# Patient Record
Sex: Male | Born: 1950
Health system: Southern US, Community
[De-identification: ages and names within clinical notes are randomized; demographics above are authoritative.]

## PROBLEM LIST (undated history)

## (undated) DIAGNOSIS — E785 Hyperlipidemia, unspecified: Secondary | ICD-10-CM

## (undated) DIAGNOSIS — I10 Essential (primary) hypertension: Secondary | ICD-10-CM

## (undated) DIAGNOSIS — C61 Malignant neoplasm of prostate: Secondary | ICD-10-CM

## (undated) DIAGNOSIS — E119 Type 2 diabetes mellitus without complications: Secondary | ICD-10-CM

## (undated) HISTORY — PX: HEMORRHOID SURGERY: SHX153

## (undated) HISTORY — PX: COLONOSCOPY: SHX174

---

## 2003-02-06 ENCOUNTER — Ambulatory Visit (HOSPITAL_COMMUNITY): Admission: RE | Admit: 2003-02-06 | Discharge: 2003-02-06 | Payer: Self-pay | Admitting: Gastroenterology

## 2011-09-15 ENCOUNTER — Other Ambulatory Visit (HOSPITAL_COMMUNITY): Payer: Self-pay | Admitting: Urology

## 2011-09-15 DIAGNOSIS — C61 Malignant neoplasm of prostate: Secondary | ICD-10-CM

## 2011-09-25 ENCOUNTER — Encounter (HOSPITAL_COMMUNITY)
Admission: RE | Admit: 2011-09-25 | Discharge: 2011-09-25 | Disposition: A | Discharge status: Home / Self Care | Payer: BC Managed Care – PPO | Source: Ambulatory Visit | Attending: Urology | Admitting: Urology

## 2011-09-25 ENCOUNTER — Ambulatory Visit (HOSPITAL_COMMUNITY): Payer: Self-pay

## 2011-09-25 DIAGNOSIS — C61 Malignant neoplasm of prostate: Secondary | ICD-10-CM | POA: Insufficient documentation

## 2011-09-25 MED ORDER — TECHNETIUM TC 99M MEDRONATE IV KIT
26.0000 | PACK | Freq: Once | INTRAVENOUS | Status: AC | PRN
  Administered 2011-09-25: 26 via INTRAVENOUS

## 2011-11-02 ENCOUNTER — Other Ambulatory Visit (HOSPITAL_COMMUNITY): Payer: Self-pay | Admitting: Urology

## 2011-11-02 DIAGNOSIS — C61 Malignant neoplasm of prostate: Secondary | ICD-10-CM

## 2011-11-14 ENCOUNTER — Encounter (HOSPITAL_COMMUNITY): Payer: Self-pay | Admitting: Pharmacy Technician

## 2011-11-14 ENCOUNTER — Other Ambulatory Visit: Payer: Self-pay | Admitting: Radiology

## 2011-11-16 ENCOUNTER — Encounter (HOSPITAL_COMMUNITY): Payer: Self-pay

## 2011-11-16 ENCOUNTER — Ambulatory Visit (HOSPITAL_COMMUNITY)
Admission: RE | Admit: 2011-11-16 | Discharge: 2011-11-16 | Disposition: A | Discharge status: Home / Self Care | Payer: BC Managed Care – PPO | Source: Ambulatory Visit | Attending: Urology | Admitting: Urology

## 2011-11-16 DIAGNOSIS — E785 Hyperlipidemia, unspecified: Secondary | ICD-10-CM | POA: Insufficient documentation

## 2011-11-16 DIAGNOSIS — F17200 Nicotine dependence, unspecified, uncomplicated: Secondary | ICD-10-CM | POA: Insufficient documentation

## 2011-11-16 DIAGNOSIS — E119 Type 2 diabetes mellitus without complications: Secondary | ICD-10-CM | POA: Insufficient documentation

## 2011-11-16 DIAGNOSIS — I10 Essential (primary) hypertension: Secondary | ICD-10-CM | POA: Insufficient documentation

## 2011-11-16 DIAGNOSIS — R599 Enlarged lymph nodes, unspecified: Secondary | ICD-10-CM | POA: Insufficient documentation

## 2011-11-16 DIAGNOSIS — C61 Malignant neoplasm of prostate: Secondary | ICD-10-CM | POA: Insufficient documentation

## 2011-11-16 HISTORY — DX: Hyperlipidemia, unspecified: E78.5

## 2011-11-16 HISTORY — DX: Malignant neoplasm of prostate: C61

## 2011-11-16 HISTORY — DX: Essential (primary) hypertension: I10

## 2011-11-16 HISTORY — DX: Type 2 diabetes mellitus without complications: E11.9

## 2011-11-16 LAB — CBC
HCT: 42.3 % (ref 39.0–52.0)
Hemoglobin: 14.5 g/dL (ref 13.0–17.0)
MCH: 32.6 pg (ref 26.0–34.0)
MCHC: 34.3 g/dL (ref 30.0–36.0)
MCV: 95.1 fL (ref 78.0–100.0)
Platelets: 185 10*3/uL (ref 150–400)
RBC: 4.45 MIL/uL (ref 4.22–5.81)
RDW: 14.7 % (ref 11.5–15.5)
WBC: 9.8 10*3/uL (ref 4.0–10.5)

## 2011-11-16 LAB — GLUCOSE, CAPILLARY
Glucose-Capillary: 114 mg/dL — ABNORMAL HIGH (ref 70–99)
Glucose-Capillary: 133 mg/dL — ABNORMAL HIGH (ref 70–99)

## 2011-11-16 LAB — APTT: aPTT: 35 seconds (ref 24–37)

## 2011-11-16 LAB — PROTIME-INR
INR: 0.95 (ref 0.00–1.49)
Prothrombin Time: 12.6 seconds (ref 11.6–15.2)

## 2011-11-16 MED ORDER — MIDAZOLAM HCL 2 MG/2ML IJ SOLN
INTRAMUSCULAR | Status: AC
  Filled 2011-11-16: qty 6

## 2011-11-16 MED ORDER — FENTANYL CITRATE 0.05 MG/ML IJ SOLN
INTRAMUSCULAR | Status: DC | PRN
  Administered 2011-11-16 (×2): 50 ug via INTRAVENOUS

## 2011-11-16 MED ORDER — FENTANYL CITRATE 0.05 MG/ML IJ SOLN
INTRAMUSCULAR | Status: AC
  Filled 2011-11-16: qty 4

## 2011-11-16 MED ORDER — MIDAZOLAM HCL 2 MG/2ML IJ SOLN
INTRAMUSCULAR | Status: DC | PRN
  Administered 2011-11-16: 1 mg via INTRAVENOUS
  Administered 2011-11-16: 2 mg via INTRAVENOUS

## 2011-11-16 MED ORDER — SODIUM CHLORIDE 0.9 % IV SOLN: Freq: Once | INTRAVENOUS | Status: DC

## 2011-11-16 NOTE — H&P (Signed)
Kyle George is an 61 y.o. male.   Chief Complaint: new dx prostate ca Left iliac lymphadenopathy Scheduled for bx per Dr Brunilda Payor HPI: DM; HTN; HLD  Past Medical History  Diagnosis Date  . Diabetes mellitus without complication   . Cancer of prostate   . Hypertension   . Hyperlipidemia     Past Surgical History  Procedure Date  . Hemorrhoid surgery     History reviewed. No pertinent family history. Social History:  reports that he has been smoking.  He does not have any smokeless tobacco history on file. His alcohol and drug histories not on file.  Allergies: No Known Allergies   (Not in a hospital admission)  Results for orders placed during the hospital encounter of 11/16/11 (from the past 48 hour(s))  APTT     Status: Normal   Collection Time   11/16/11 10:11 AM      Component Value Range Comment   aPTT 35  24 - 37 seconds   CBC     Status: Normal   Collection Time   11/16/11 10:11 AM      Component Value Range Comment   WBC 9.8  4.0 - 10.5 K/uL    RBC 4.45  4.22 - 5.81 MIL/uL    Hemoglobin 14.5  13.0 - 17.0 g/dL    HCT 96.0  45.4 - 09.8 %    MCV 95.1  78.0 - 100.0 fL    MCH 32.6  26.0 - 34.0 pg    MCHC 34.3  30.0 - 36.0 g/dL    RDW 11.9  14.7 - 82.9 %    Platelets 185  150 - 400 K/uL   PROTIME-INR     Status: Normal   Collection Time   11/16/11 10:11 AM      Component Value Range Comment   Prothrombin Time 12.6  11.6 - 15.2 seconds    INR 0.95  0.00 - 1.49   GLUCOSE, CAPILLARY     Status: Abnormal   Collection Time   11/16/11 10:15 AM      Component Value Range Comment   Glucose-Capillary 114 (*) 70 - 99 mg/dL    Comment 1 Notify RN      Comment 2 Documented in Chart      No results found.  Review of Systems  Constitutional: Negative for fever and chills.  Respiratory: Negative for cough.   Cardiovascular: Negative for chest pain.  Gastrointestinal: Negative for nausea and vomiting.  Neurological: Negative for weakness and headaches.    Blood  pressure 155/61, pulse 59, temperature 97.7 F (36.5 C), temperature source Oral, resp. rate 18, height 5\' 9"  (1.753 m), weight 202 lb (91.627 kg), SpO2 99.00%. Physical Exam  Constitutional: He is oriented to person, place, and time.  Cardiovascular: Normal rate, regular rhythm and normal heart sounds.   No murmur heard. Respiratory: Effort normal and breath sounds normal. He has no wheezes.  GI: Soft. Bowel sounds are normal. There is no tenderness.  Musculoskeletal: Normal range of motion.  Neurological: He is alert and oriented to person, place, and time.  Psychiatric: He has a normal mood and affect. His behavior is normal. Judgment and thought content normal.     Assessment/Plan New Dx Prostate Ca Iliac LAN Scheduled for Left iliac LN bx today Pt aware of procedure benefits and risks and agreeable to proceed Consent signed and in chart  [place]leg[place] Dominique A 11/16/2011, 11:06 AM

## 2011-11-16 NOTE — Procedures (Signed)
Successful RT ILIAC ADENOPATHY 18G CORE BXS NO COMP STABLE FULL REPORT IN PACS PATH PENDING

## 2013-10-30 ENCOUNTER — Other Ambulatory Visit (HOSPITAL_COMMUNITY): Payer: Self-pay | Admitting: Urology

## 2013-10-30 DIAGNOSIS — C61 Malignant neoplasm of prostate: Secondary | ICD-10-CM

## 2013-11-07 ENCOUNTER — Ambulatory Visit (HOSPITAL_COMMUNITY)
Admission: RE | Admit: 2013-11-07 | Discharge: 2013-11-07 | Disposition: A | Discharge status: Home / Self Care | Payer: BC Managed Care – PPO | Source: Ambulatory Visit | Attending: Diagnostic Radiology | Admitting: Diagnostic Radiology

## 2013-11-07 ENCOUNTER — Encounter (HOSPITAL_COMMUNITY): Payer: BC Managed Care – PPO

## 2013-11-07 ENCOUNTER — Ambulatory Visit (HOSPITAL_COMMUNITY)
Admission: RE | Admit: 2013-11-07 | Discharge: 2013-11-07 | Disposition: A | Discharge status: Home / Self Care | Payer: BC Managed Care – PPO | Source: Ambulatory Visit | Attending: Urology | Admitting: Urology

## 2013-11-07 ENCOUNTER — Ambulatory Visit (HOSPITAL_COMMUNITY): Admission: RE | Admit: 2013-11-07 | Payer: BC Managed Care – PPO | Source: Ambulatory Visit

## 2013-11-07 DIAGNOSIS — C61 Malignant neoplasm of prostate: Secondary | ICD-10-CM | POA: Diagnosis not present

## 2013-11-07 MED ORDER — TECHNETIUM TC 99M MEDRONATE IV KIT
24.0000 | PACK | Freq: Once | INTRAVENOUS | Status: AC | PRN
  Administered 2013-11-07: 24 via INTRAVENOUS

## 2013-12-10 ENCOUNTER — Telehealth: Payer: Self-pay | Admitting: Oncology

## 2013-12-10 NOTE — Telephone Encounter (Signed)
S/W PATIENT AND GAVE NP APPT FOR 12/04 @ 1:30 W/DR. SHADAD.  REFERRING DR. MARC NESI DX- PROSTATE CA

## 2013-12-12 ENCOUNTER — Ambulatory Visit (HOSPITAL_BASED_OUTPATIENT_CLINIC_OR_DEPARTMENT_OTHER): Payer: BC Managed Care – PPO | Admitting: Oncology

## 2013-12-12 ENCOUNTER — Telehealth: Payer: Self-pay | Admitting: Oncology

## 2013-12-12 ENCOUNTER — Other Ambulatory Visit: Payer: BC Managed Care – PPO

## 2013-12-12 ENCOUNTER — Ambulatory Visit: Payer: BC Managed Care – PPO

## 2013-12-12 VITALS — BP 147/95 | HR 77 | Temp 98.1°F | Resp 18 | Ht 69.0 in | Wt 210.0 lb

## 2013-12-12 DIAGNOSIS — C61 Malignant neoplasm of prostate: Secondary | ICD-10-CM

## 2013-12-12 DIAGNOSIS — C7951 Secondary malignant neoplasm of bone: Secondary | ICD-10-CM

## 2013-12-12 DIAGNOSIS — R609 Edema, unspecified: Secondary | ICD-10-CM

## 2013-12-12 NOTE — Progress Notes (Signed)
Please see consult note.  

## 2013-12-12 NOTE — Telephone Encounter (Signed)
Pt confirmed labs/ov per 12/04 POF, gave pt AVS..... KJ

## 2013-12-12 NOTE — Consult Note (Signed)
Reason for Referral: Prostate cancer.  HPI: 63 year old gentleman currently of [place]. He is a rather healthy gentleman with history of hypertension and diabetes but relatively controlled. He was in his usual state of health until about 2013 to he presented with an elevated PSA of 397. He was referred to Dr. Janice Norrie and on digital rectal examination he was found to have an indurated left lobe. He underwent a prostate biopsy on 09/08/2011 and the pathology revealed a Gleason score of 4+5 = 9 as well as a 4+4 = 8 and the majority of the cores. Patient failed to follow-up subsequently and apparently developed lower extremity edema presumably lymphadenopathy. He reestablish care in October 2015 and a bone scan showed widespread bony metastatic disease. He was started on Firmagon and at that time his PSA was 3779. His PSA a month later dropped down to 500. He tolerated androgen deprivation well and was referred to me for consideration of systemic chemotherapy.  Clinically, he is asymptomatic at this time. He does not report any headaches or blurry vision or syncope. He does not report any fevers, chills, sweats, weight loss or appetite changes. Does not affect care of gained weight since androgen deprivation started. He did not report any hot flashes or any constitutional symptoms. He did not report any chest pain, palpitation, orthopnea or PND. He still has chronic left lower extremity edema. He does not report any cough, shortness of breath or hemoptysis. He does not report any nausea, vomiting, constipation, diarrhea or any changes in his bowel habits. He does not report any frequency, urgency hematuria or dysuria. He does not report any skeletal complaints his back pain and hip pain or shoulder pain. He does not report any lymphadenopathy or petechiae. He continues to work full time without any hindrance or decline. Rest of his review of systems unremarkable.   Past Medical History  Diagnosis Date   . Diabetes mellitus without complication   . Cancer of prostate   . Hypertension   . Hyperlipidemia   :  Past Surgical History  Procedure Laterality Date  . Hemorrhoid surgery    :   Current Outpatient Prescriptions  Medication Sig Dispense Refill  . Multiple Vitamin (MULTIVITAMIN WITH MINERALS) TABS Take 1 tablet by mouth daily.    . pioglitazone-metformin (ACTOPLUS MET) 15-850 MG per tablet Take 1 tablet by mouth Twice daily.    . rosuvastatin (CRESTOR) 10 MG tablet Take 10 mg by mouth daily.    . sitaGLIPtin (JANUVIA) 100 MG tablet Take 100 mg by mouth daily.    Jabier Gauss 40-10-25 MG TABS Take 1 tablet by mouth Daily.     No current facility-administered medications for this visit.      No Known Allergies:   History   Social History  . Marital Status: Married    Spouse Name: N/A    Number of Children: N/A  . Years of Education: N/A   Occupational History  . Not on file.   Social History Main Topics  . Smoking status: Current Every Day Smoker  . Smokeless tobacco: Not on file  . Alcohol Use: Not on file  . Drug Use: Not on file  . Sexual Activity: Not on file   Other Topics Concern  . Not on file   Social History Narrative  . No narrative on file  :  Pertinent items are noted in HPI.  Exam: ECOG 0 There were no vitals taken for this visit. General appearance: alert and cooperative Head: Normocephalic,  without obvious abnormality Throat: lips, mucosa, and tongue normal; teeth and gums normal Neck: no adenopathy Back: negative Resp: clear to auscultation bilaterally Chest wall: no tenderness GI: soft, non-tender; bowel sounds normal; no masses,  no organomegaly Male genitalia: normal Extremities: Edema noted left more than the right at 1+. Pulses: 2+ and symmetric Skin: Skin color, texture, turgor normal. No rashes or lesions Lymph nodes: Cervical, supraclavicular, and axillary nodes normal.    Assessment and Plan:    63 year old  gentleman with the following issues:  1. Prostate cancer diagnosed in August 2013 after presenting with a PSA of 397. His initial biopsy showed a Gleason score of 4+5 = 9 with a predominant pattern 4+4 = 8 and 11 out of 12 cores. He was lost to follow-up and initiated therapy in October 2015 after his PSA was up to 3779. His bone scan showed metastatic bony disease and possibly he has pelvic adenopathy with chronic left lower extremity edema. His PSA dropped down to 500 after one month of androgen deprivation in the form of Firmagon.  The natural course of advanced prostate cancer was discussed with the patient extensively. He understand these treatment modalities or not curative but rather palliative at this time. At this time, he has excellent response to androgen deprivation but certainly has bulky disease and qualifies for systemic chemotherapy. The risks and benefits of adding Taxotere chemotherapy for a total of 6 cycles was discussed today. Complications include nausea, vomiting, myelosuppression, neutropenia, neutropenic sepsis, peripheral neuropathy as well as infusion related toxicities were discussed. The benefit would include improve cancer control and overall survival. That overall survival can exceed double digit months and certain patient populations.  The alternatives is to await castration resistant disease which given his advanced cancer this can happen rather quick.  After discussing the risks and benefits of all these approaches he would like to consider all his options at this time and to think about it. He prefers to continue working to his retirement presumably in the spring of 2016. May be after that he will consider adding chemotherapy. I will arrange for a quick follow-up in 3 months to check on his clinical status and certainly if he changes his mind we can proceed with systemic chemotherapy sooner.  2. Androgen depravation: I recommend continuing that for the time being and  likely indefinitely.  3. IV access: He will probably require a Port-A-Cath insertion upon deciding on chemotherapy. Complications include thrombosis, bleeding and infection.  4. Bone health: He would benefit from bone directed therapy such as Xgeva or Zometa after dental clearance. This will continue to be addressed in future visits.  All his questions were answered today to his satisfaction.

## 2014-03-13 ENCOUNTER — Ambulatory Visit (HOSPITAL_BASED_OUTPATIENT_CLINIC_OR_DEPARTMENT_OTHER): Payer: BC Managed Care – PPO | Admitting: Oncology

## 2014-03-13 ENCOUNTER — Telehealth: Payer: Self-pay | Admitting: Oncology

## 2014-03-13 ENCOUNTER — Other Ambulatory Visit (HOSPITAL_BASED_OUTPATIENT_CLINIC_OR_DEPARTMENT_OTHER): Payer: BC Managed Care – PPO

## 2014-03-13 VITALS — BP 165/88 | HR 88 | Temp 97.8°F | Resp 20 | Ht 69.0 in | Wt 212.3 lb

## 2014-03-13 DIAGNOSIS — R609 Edema, unspecified: Secondary | ICD-10-CM

## 2014-03-13 DIAGNOSIS — E291 Testicular hypofunction: Secondary | ICD-10-CM

## 2014-03-13 DIAGNOSIS — C61 Malignant neoplasm of prostate: Secondary | ICD-10-CM

## 2014-03-13 DIAGNOSIS — C7951 Secondary malignant neoplasm of bone: Secondary | ICD-10-CM

## 2014-03-13 LAB — COMPREHENSIVE METABOLIC PANEL (CC13)
ALT: 21 U/L (ref 0–55)
AST: 17 U/L (ref 5–34)
Albumin: 3.3 g/dL — ABNORMAL LOW (ref 3.5–5.0)
Alkaline Phosphatase: 99 U/L (ref 40–150)
Anion Gap: 14 mEq/L — ABNORMAL HIGH (ref 3–11)
BUN: 9.7 mg/dL (ref 7.0–26.0)
CO2: 21 mEq/L — ABNORMAL LOW (ref 22–29)
Calcium: 9.1 mg/dL (ref 8.4–10.4)
Chloride: 103 mEq/L (ref 98–109)
Creatinine: 0.8 mg/dL (ref 0.7–1.3)
EGFR: >90 mL/min/{1.73_m2} (ref >90)
Glucose: 263 mg/dl — ABNORMAL HIGH (ref 70–140)
Potassium: 3.5 mEq/L (ref 3.5–5.1)
Sodium: 138 mEq/L (ref 136–145)
Total Bilirubin: 0.24 mg/dL (ref 0.20–1.20)
Total Protein: 7.1 g/dL (ref 6.4–8.3)

## 2014-03-13 LAB — CBC WITH DIFFERENTIAL/PLATELET
BASO%: 0.4 % (ref 0.0–2.0)
Basophils Absolute: 0 10*3/uL (ref 0.0–0.1)
EOS%: 3.8 % (ref 0.0–7.0)
Eosinophils Absolute: 0.3 10*3/uL (ref 0.0–0.5)
HCT: 41.4 % (ref 38.4–49.9)
HGB: 14 g/dL (ref 13.0–17.1)
LYMPH%: 27.5 % (ref 14.0–49.0)
MCH: 31.9 pg (ref 27.2–33.4)
MCHC: 33.8 g/dL (ref 32.0–36.0)
MCV: 94.3 fL (ref 79.3–98.0)
MONO#: 0.5 10*3/uL (ref 0.1–0.9)
MONO%: 6.3 % (ref 0.0–14.0)
NEUT#: 4.6 10*3/uL (ref 1.5–6.5)
NEUT%: 62 % (ref 39.0–75.0)
Platelets: 183 10*3/uL (ref 140–400)
RBC: 4.39 10*6/uL (ref 4.20–5.82)
RDW: 13.8 % (ref 11.0–14.6)
WBC: 7.5 10*3/uL (ref 4.0–10.3)
lymph#: 2.1 10*3/uL (ref 0.9–3.3)

## 2014-03-13 NOTE — Telephone Encounter (Signed)
Gave avs & calendar for August °

## 2014-03-13 NOTE — Progress Notes (Signed)
Hematology and Oncology Follow Up Visit  Kyle George [id number] 02-Jul-1950 64 y.o. 03/13/2014 3:06 PM Maximino Greenland, MDSanders, Bailey Mech, MD   Principle Diagnosis: 64 year old gentleman diagnosed with prostate cancer on 09/08/2011. He had a Gleason score 4+5 = 9 and a PSA of 397. He subsequently developed widespread metastatic bony disease and lymphadenopathy in October 2015. His PSA at that time was 3779.   Prior Therapy: Status post androgen deprivation and initially with [place] subsequently with Lupron with a PSA dropping down to 500 and December 2015.  Current therapy: Androgen deprivation therapy. He is under consideration for systemic chemotherapy which she has declined in the past.  Interim History:  [name] presents today for a follow-up visit. Since the last visit, he continues to do very well. He continues to work full time without any decline in his energy her performance status. He does not report any skeletal complaints including back pain, shoulder pain or hip pain. He is not reporting any lower extremity edema. Does not report any constitutional symptoms or early satiety. He does not report any headaches or blurry vision or syncope. He does not report any fevers, chills, sweats, weight loss or appetite changes. Does not affect care of gained weight since androgen deprivation started. He did not report any hot flashes or any constitutional symptoms. He did not report any chest pain, palpitation, orthopnea or PND. He still has chronic left lower extremity edema. He does not report any cough, shortness of breath or hemoptysis. He does not report any nausea, vomiting, constipation, diarrhea or any changes in his bowel habits. He does not report any frequency, urgency hematuria or dysuria. The rest of his review of systems unremarkable.  Medications: I have reviewed the patient's current medications.  Current Outpatient Prescriptions  Medication Sig Dispense Refill  . aspirin 81  MG tablet Take 81 mg by mouth daily.    . Calcium Carbonate-Vitamin D (CALCIUM 600+D PO) Take 1 tablet by mouth daily.    . carvedilol (COREG) 6.25 MG tablet Take 6.25 mg by mouth 2 (two) times daily.  3  . cholecalciferol (VITAMIN D) 1000 UNITS tablet Take 5,000 Units by mouth daily.    . Insulin Detemir (LEVEMIR FLEXTOUCH [place]) Inject into the skin.    . Multiple Vitamin (MULTIVITAMIN WITH MINERALS) TABS Take 1 tablet by mouth daily.    . NON FORMULARY Take 1 tablet by mouth daily. Thyroid  action    . pioglitazone-metformin (ACTOPLUS MET) 15-850 MG per tablet Take 1 tablet by mouth Twice daily.    . rosuvastatin (CRESTOR) 10 MG tablet Take 10 mg by mouth daily.    Jabier Gauss 40-10-25 MG TABS Take 1 tablet by mouth Daily.     No current facility-administered medications for this visit.     Allergies: No Known Allergies  Past Medical History, Surgical history, Social history, and Family History were reviewed and updated.   Physical Exam: Blood pressure 165/88, pulse 88, temperature 97.8 F (36.6 C), temperature source Oral, resp. rate 20, height _0  (1.753 m), weight 212 lb 4.8 oz (96.299 kg), SpO2 98 %. ECOG: 0 General appearance: alert and cooperative Head: Normocephalic, without obvious abnormality Neck: no adenopathy Lymph nodes: Cervical, supraclavicular, and axillary nodes normal. Heart:regular rate and rhythm, S1, S2 normal, no murmur, click, rub or gallop Lung:chest clear, no wheezing, rales, normal symmetric air entry Abdomin: soft, non-tender, without masses or organomegaly EXT:no erythema, induration, or nodules   Lab Results: Lab Results  Component Value Date  WBC 7.5 03/13/2014   HGB 14.0 03/13/2014   HCT 41.4 03/13/2014   MCV 94.3 03/13/2014   PLT 183 03/13/2014            Impression and Plan:   64 year old gentleman with the following issues  1. Prostate cancer diagnosed in August 2013 after presenting with a PSA of 397. His initial biopsy showed  a Gleason score of 4+5 = 9 with a predominant pattern 4+4 = 8 and 11 out of 12 cores. He was lost to follow-up and initiated therapy in October 2015 after his PSA was up to 3779. His bone scan showed metastatic bony disease and possibly he has pelvic adenopathy with chronic left lower extremity edema. His PSA dropped down to 500 after one month of androgen deprivation in the form of Firmagon.  His PSA from today is currently pending.  He continues to refuse systemic chemotherapy for the time being and would like to defer that until the future. Elect to keep continue to keep working at this time. I explained to him that he could potentially can keep working while on chemotherapy but he prefers not to take this chance. I will evaluate him in 4-5 months and readdress this issue with him.   2. Androgen depravation: I recommend continuing that for the time being and likely indefinitely.  3. IV access: He will probably require a Port-A-Cath insertion upon deciding on chemotherapy. He continues to defer this at this time.  4. Bone health: He would benefit from bone directed therapy such as Xgeva or Zometa after dental clearance. This will continue to be addressed in future visits.  5. Follow-up: Will be in August 2016 sooner if there is any issues.  [facility], MD 3/4/20163:06 PM

## 2014-03-14 LAB — PSA: PSA: 78.69 ng/mL — ABNORMAL HIGH (ref <=4.00)

## 2014-08-14 ENCOUNTER — Ambulatory Visit: Payer: BC Managed Care – PPO | Admitting: Oncology

## 2014-08-14 ENCOUNTER — Other Ambulatory Visit: Payer: BC Managed Care – PPO

## 2016-05-02 ENCOUNTER — Other Ambulatory Visit: Payer: Self-pay | Admitting: Urology

## 2016-05-02 DIAGNOSIS — C61 Malignant neoplasm of prostate: Secondary | ICD-10-CM

## 2016-05-10 ENCOUNTER — Encounter (HOSPITAL_COMMUNITY)
Admission: RE | Admit: 2016-05-10 | Discharge: 2016-05-10 | Disposition: A | Discharge status: Home / Self Care | Payer: BC Managed Care – PPO | Source: Ambulatory Visit | Attending: Urology | Admitting: Urology

## 2016-05-10 DIAGNOSIS — C61 Malignant neoplasm of prostate: Secondary | ICD-10-CM | POA: Diagnosis not present

## 2016-05-10 MED ORDER — TECHNETIUM TC 99M MEDRONATE IV KIT
20.3000 | PACK | Freq: Once | INTRAVENOUS | Status: AC | PRN
  Administered 2016-05-10: 20.3 via INTRAVENOUS

## 2016-05-29 ENCOUNTER — Encounter: Payer: Self-pay | Admitting: *Deleted

## 2016-05-31 ENCOUNTER — Telehealth: Payer: Self-pay | Admitting: Oncology

## 2016-05-31 NOTE — Telephone Encounter (Signed)
Spoke with patient re f/u 5/24

## 2016-06-01 ENCOUNTER — Telehealth: Payer: Self-pay | Admitting: Oncology

## 2016-06-01 ENCOUNTER — Ambulatory Visit (HOSPITAL_BASED_OUTPATIENT_CLINIC_OR_DEPARTMENT_OTHER): Payer: BC Managed Care – PPO | Admitting: Oncology

## 2016-06-01 VITALS — BP 146/79 | HR 61 | Temp 98.6°F | Resp 17 | Ht 69.0 in | Wt 192.9 lb

## 2016-06-01 DIAGNOSIS — R609 Edema, unspecified: Secondary | ICD-10-CM

## 2016-06-01 DIAGNOSIS — E291 Testicular hypofunction: Secondary | ICD-10-CM

## 2016-06-01 DIAGNOSIS — M25551 Pain in right hip: Secondary | ICD-10-CM | POA: Diagnosis not present

## 2016-06-01 DIAGNOSIS — C7951 Secondary malignant neoplasm of bone: Secondary | ICD-10-CM

## 2016-06-01 DIAGNOSIS — C61 Malignant neoplasm of prostate: Secondary | ICD-10-CM | POA: Diagnosis not present

## 2016-06-01 NOTE — Progress Notes (Signed)
Hematology and Oncology Follow Up Visit  Kyle George [id number] [birth year] 66 y.o. 06/01/2016 4:20 PM Kyle George, Kyle George, Kyle Mech, MD   Principle Diagnosis: 66 year old gentleman diagnosed with prostate cancer on 09/08/2011. He had a Gleason score 4+5 = 9 and a PSA of 397. He subsequently developed widespread metastatic bony disease and lymphadenopathy in October 2015. His PSA at that time was 3779.   Prior Therapy: Status post androgen deprivation and initially with [place] subsequently with Lupron with a PSA dropping down to 500 and December 2015.  Current therapy: Under consideration to start androgen deprivation therapy.  Interim History:  Kyle George presents today for a follow-up visit. Since the last visit, he had been lost to follow-up and has not been receiving any anticancer treatment. He missed his injection appointments with Dr. Louis Meckel and recently was evaluated for his prostate cancer. His PSA in April 2018 was 1200 and he has been complaining of right-sided hip pain. Despite his pain he is able to ambulate without any major difficulties and continues to work full time. He lost about 10 pounds. He is still able to drive and attends to activities of daily living without any major decline in his quality of life.   He does not report any headaches or blurry vision or syncope. He does not report any fevers, chills, sweats, weight loss or appetite changes.  He did not report any chest pain, palpitation, orthopnea or PND. He still has chronic left lower extremity edema. He does not report any cough, shortness of breath or hemoptysis. He does not report any nausea, vomiting, constipation, diarrhea or any changes in his bowel habits. He does not report any frequency, urgency hematuria or dysuria. The rest of his review of systems unremarkable.  Medications: I have reviewed the patient's current medications.  Current Outpatient Prescriptions  Medication Sig Dispense Refill  .  aspirin 81 MG tablet Take 81 mg by mouth daily.    . Calcium Carbonate-Vitamin D (CALCIUM 600+D PO) Take 1 tablet by mouth daily.    . carvedilol (COREG) 6.25 MG tablet Take 6.25 mg by mouth 2 (two) times daily.  3  . cholecalciferol (VITAMIN D) 1000 UNITS tablet Take 5,000 Units by mouth daily.    . Insulin Detemir (LEVEMIR FLEXTOUCH [place]) Inject into the skin.    . Multiple Vitamin (MULTIVITAMIN WITH MINERALS) TABS Take 1 tablet by mouth daily.    . NON FORMULARY Take 1 tablet by mouth daily. Thyroid  action    . pioglitazone-metformin (ACTOPLUS MET) 15-850 MG per tablet Take 1 tablet by mouth Twice daily.    . rosuvastatin (CRESTOR) 10 MG tablet Take 10 mg by mouth daily.    Kyle George 40-10-25 MG TABS Take 1 tablet by mouth Daily.     No current facility-administered medications for this visit.      Allergies: No Known Allergies  Past Medical History, Surgical history, Social history, and Family History were reviewed and updated.   Physical Exam: Blood pressure (!) 146/79, pulse 61, temperature 98.6 F (37 C), temperature source Oral, resp. rate 17, height '5\' 9"'  (1.753 m), weight 192 lb 14.4 oz (87.5 kg), SpO2 98 %. ECOG: 0 General appearance: alert and cooperative appeared without distress. Head: Normocephalic, without obvious abnormality no oral ulcers or lesions. Neck: no adenopathy Lymph nodes: Cervical, supraclavicular, and axillary nodes normal. Heart:regular rate and rhythm, S1, S2 normal, no murmur, click, rub or gallop Lung:chest clear, no wheezing, rales, normal symmetric air entry Abdomin: soft, non-tender, without masses  or organomegaly no shifting dullness or ascites. EXT:no erythema, induration, or nodules   Lab Results: Lab Results  Component Value Date   WBC 7.5 03/13/2014   HGB 14.0 03/13/2014   HCT 41.4 03/13/2014   MCV 94.3 03/13/2014   PLT 183 03/13/2014           EXAM: NUCLEAR MEDICINE WHOLE BODY BONE SCAN  TECHNIQUE: Whole body anterior and  posterior images were obtained approximately 3 hours after intravenous injection of radiopharmaceutical.  RADIOPHARMACEUTICALS:  20.3 mCi Technetium-27mMDP IV  COMPARISON:  11/07/2013  FINDINGS: There is adequate uptake of radioactive tracer throughout the bony skeleton. Bilateral renal activity is noted. There again noted multiple areas of increased activity consistent with metastatic disease. These changes have progressed somewhat in the proximal left humerus, right scapula, L3 vertebra posteriorly as well as within the pelvic bones when compared with the prior exam. Some decreased activity is noted particularly in the ribcage.  IMPRESSION: Changes consistent with multifocal metastatic disease from the patient's known prostate carcinoma. Some areas have increased in activity of the ribcage predominately has decreased in activity from the prior study.  Impression and Plan:   66year old gentleman with the following issues  1. Prostate cancer diagnosed in August 2013 after presenting with a PSA of 397. His initial biopsy showed a Gleason score of 4+5 = 9 with a predominant pattern 4+4 = 8 and 11 out of 12 cores. He was lost to follow-up and initiated therapy in October 2015 after his PSA was up to 3779. His bone scan showed metastatic bony disease and possibly he has pelvic adenopathy with chronic left lower extremity edema. His PSA dropped down to 500 after one month of androgen deprivation in the form of Firmagon.  His PSA in March 2016 was 78.  He has not received any therapy since that time and he reestablish care in April 2018. His PSA was up to 1200 and bone scan on 05/10/2016 showed widespread metastatic disease. He also had a CT scan of the abdomen and pelvis which showed pelvic adenopathy.  The natural course of this disease was discussed today with the patient. It is unclear to me that he had developed castration resistant disease and likely he still has hormone  sensitive element of his disease. The first step to treating him we'll be restarting androgen deprivation therapy.  Given his bulky disease and high-risk features, he will benefit from additional therapy. Systemic chemotherapy will be his best option which will be considered after initiation of androgen deprivation. I discussed the risks and benefits associated with Taxotere chemotherapy. These complications include nausea, vomiting, myelosuppression among others.  For the time being he agreed to proceed with androgen deprivation and consider chemotherapy in the future. I will repeat his PSA in 6 weeks and we introduced the idea of starting chemotherapy at that time.  2. Androgen depravation: He'll receive a Lupron 30 mg every 4 months indefinitely.  3. IV access: He will probably require a Port-A-Cath insertion upon deciding on chemotherapy. He continues to defer this at this time.  4. Bone health: He would benefit from bone directed therapy such as Xgeva or Zometa after dental clearance. This will continue to be addressed in future visits.  5. Hip pain: Unclear for related to his cancer or arthritis. If his pain does not improve with hormone therapy, radiation therapy could be an option as well for palliative purposes.  6. Follow-up: Will be in one week to receive Lupron. I will recheck his  PSA in 6 weeks and consider starting chemotherapy at that time.  Zola Button, MD 5/24/20184:20 PM

## 2016-06-01 NOTE — Telephone Encounter (Signed)
Gave patient AVS and calender per 5/24 LOS.  

## 2016-06-09 ENCOUNTER — Ambulatory Visit (HOSPITAL_BASED_OUTPATIENT_CLINIC_OR_DEPARTMENT_OTHER): Payer: BC Managed Care – PPO

## 2016-06-09 VITALS — BP 108/58 | HR 65 | Temp 97.9°F | Resp 18

## 2016-06-09 DIAGNOSIS — C61 Malignant neoplasm of prostate: Secondary | ICD-10-CM | POA: Diagnosis not present

## 2016-06-09 DIAGNOSIS — C7951 Secondary malignant neoplasm of bone: Secondary | ICD-10-CM | POA: Diagnosis not present

## 2016-06-09 DIAGNOSIS — Z5111 Encounter for antineoplastic chemotherapy: Secondary | ICD-10-CM

## 2016-06-09 MED ORDER — LEUPROLIDE ACETATE (4 MONTH) 30 MG IM KIT
30.0000 mg | PACK | Freq: Once | INTRAMUSCULAR | Status: AC
  Administered 2016-06-09: 30 mg via INTRAMUSCULAR
  Filled 2016-06-09: qty 30

## 2016-06-09 NOTE — Patient Instructions (Signed)
Leuprolide depot injection What is this medicine? LEUPROLIDE (loo PROE lide) is a man-made protein that acts like a natural hormone in the body. It decreases testosterone in men and decreases estrogen in women. In men, this medicine is used to treat advanced prostate cancer. In women, some forms of this medicine may be used to treat endometriosis, uterine fibroids, or other male hormone-related problems. This medicine may be used for other purposes; ask your health care provider or pharmacist if you have questions. COMMON BRAND NAME(S): Eligard, Lupron Depot, Lupron Depot-Ped, Viadur What should I tell my health care provider before I take this medicine? They need to know if you have any of these conditions: -diabetes -heart disease or previous heart attack -high blood pressure -high cholesterol -mental illness -osteoporosis -pain or difficulty passing urine -seizures -spinal cord metastasis -stroke -suicidal thoughts, plans, or attempt; a previous suicide attempt by you or a family member -tobacco smoker -unusual vaginal bleeding (women) -an unusual or allergic reaction to leuprolide, benzyl alcohol, other medicines, foods, dyes, or preservatives -pregnant or trying to get pregnant -breast-feeding How should I use this medicine? This medicine is for injection into a muscle or for injection under the skin. It is given by a health care professional in a hospital or clinic setting. The specific product will determine how it will be given to you. Make sure you understand which product you receive and how often you will receive it. Talk to your pediatrician regarding the use of this medicine in children. Special care may be needed. Overdosage: If you think you have taken too much of this medicine contact a poison control center or emergency room at once. NOTE: This medicine is only for you. Do not share this medicine with others. What if I miss a dose? It is important not to miss a dose.  Call your doctor or health care professional if you are unable to keep an appointment. Depot injections: Depot injections are given either once-monthly, every 12 weeks, every 16 weeks, or every 24 weeks depending on the product you are prescribed. The product you are prescribed will be based on if you are male or male, and your condition. Make sure you understand your product and dosing. What may interact with this medicine? Do not take this medicine with any of the following medications: -chasteberry This medicine may also interact with the following medications: -herbal or dietary supplements, like black cohosh or DHEA -male hormones, like estrogens or progestins and birth control pills, patches, rings, or injections -male hormones, like testosterone This list may not describe all possible interactions. Give your health care provider a list of all the medicines, herbs, non-prescription drugs, or dietary supplements you use. Also tell them if you smoke, drink alcohol, or use illegal drugs. Some items may interact with your medicine. What should I watch for while using this medicine? Visit your doctor or health care professional for regular checks on your progress. During the first weeks of treatment, your symptoms may get worse, but then will improve as you continue your treatment. You may get hot flashes, increased bone pain, increased difficulty passing urine, or an aggravation of nerve symptoms. Discuss these effects with your doctor or health care professional, some of them may improve with continued use of this medicine. Male patients may experience a menstrual cycle or spotting during the first months of therapy with this medicine. If this continues, contact your doctor or health care professional. What side effects may I notice from receiving this medicine? Side   effects that you should report to your doctor or health care professional as soon as possible: -allergic reactions like skin  rash, itching or hives, swelling of the face, lips, or tongue -breathing problems -chest pain -depression or memory disorders -pain in your legs or groin -pain at site where injected or implanted -seizures -severe headache -swelling of the feet and legs -suicidal thoughts or other mood changes -visual changes -vomiting Side effects that usually do not require medical attention (report to your doctor or health care professional if they continue or are bothersome): -breast swelling or tenderness -decrease in sex drive or performance -diarrhea -hot flashes -loss of appetite -muscle, joint, or bone pains -nausea -redness or irritation at site where injected or implanted -skin problems or acne This list may not describe all possible side effects. Call your doctor for medical advice about side effects. You may report side effects to FDA at 1-800-FDA-1088. Where should I keep my medicine? This drug is given in a hospital or clinic and will not be stored at home. NOTE: This sheet is a summary. It may not cover all possible information. If you have questions about this medicine, talk to your doctor, pharmacist, or health care provider.  2018 Elsevier/Gold Standard (2015-06-10 09:45:53)  

## 2016-07-31 ENCOUNTER — Other Ambulatory Visit (HOSPITAL_BASED_OUTPATIENT_CLINIC_OR_DEPARTMENT_OTHER): Payer: BC Managed Care – PPO

## 2016-07-31 DIAGNOSIS — C61 Malignant neoplasm of prostate: Secondary | ICD-10-CM

## 2016-07-31 LAB — COMPREHENSIVE METABOLIC PANEL
ALT: 14 U/L (ref 0–55)
AST: 13 U/L (ref 5–34)
Albumin: 3.4 g/dL — ABNORMAL LOW (ref 3.5–5.0)
Alkaline Phosphatase: 154 U/L — ABNORMAL HIGH (ref 40–150)
Anion Gap: 8 mEq/L (ref 3–11)
BUN: 16.5 mg/dL (ref 7.0–26.0)
CO2: 29 mEq/L (ref 22–29)
Calcium: 9.6 mg/dL (ref 8.4–10.4)
Chloride: 107 mEq/L (ref 98–109)
Creatinine: 0.9 mg/dL (ref 0.7–1.3)
EGFR: >90 mL/min/{1.73_m2} (ref >90)
Glucose: 95 mg/dl (ref 70–140)
Potassium: 3.5 mEq/L (ref 3.5–5.1)
Sodium: 144 mEq/L (ref 136–145)
Total Bilirubin: 0.33 mg/dL (ref 0.20–1.20)
Total Protein: 7.5 g/dL (ref 6.4–8.3)

## 2016-07-31 LAB — CBC WITH DIFFERENTIAL/PLATELET
BASO%: 0.5 % (ref 0.0–2.0)
Basophils Absolute: 0 10*3/uL (ref 0.0–0.1)
EOS%: 4.5 % (ref 0.0–7.0)
Eosinophils Absolute: 0.4 10*3/uL (ref 0.0–0.5)
HCT: 39.5 % (ref 38.4–49.9)
HGB: 13.1 g/dL (ref 13.0–17.1)
LYMPH%: 23.2 % (ref 14.0–49.0)
MCH: 31.8 pg (ref 27.2–33.4)
MCHC: 33.2 g/dL (ref 32.0–36.0)
MCV: 95.9 fL (ref 79.3–98.0)
MONO#: 0.5 10*3/uL (ref 0.1–0.9)
MONO%: 5.7 % (ref 0.0–14.0)
NEUT#: 5.9 10*3/uL (ref 1.5–6.5)
NEUT%: 66.1 % (ref 39.0–75.0)
Platelets: 200 10*3/uL (ref 140–400)
RBC: 4.12 10*6/uL — ABNORMAL LOW (ref 4.20–5.82)
RDW: 16 % — ABNORMAL HIGH (ref 11.0–14.6)
WBC: 8.9 10*3/uL (ref 4.0–10.3)
lymph#: 2.1 10*3/uL (ref 0.9–3.3)

## 2016-08-01 LAB — PSA: Prostate Specific Ag, Serum: 131.6 ng/mL — ABNORMAL HIGH (ref 0.0–4.0)

## 2016-08-02 ENCOUNTER — Ambulatory Visit (HOSPITAL_BASED_OUTPATIENT_CLINIC_OR_DEPARTMENT_OTHER): Payer: BC Managed Care – PPO | Admitting: Oncology

## 2016-08-02 ENCOUNTER — Telehealth: Payer: Self-pay | Admitting: Oncology

## 2016-08-02 VITALS — BP 173/83 | HR 78 | Temp 97.8°F | Resp 20 | Ht 69.0 in | Wt 192.3 lb

## 2016-08-02 DIAGNOSIS — E291 Testicular hypofunction: Secondary | ICD-10-CM | POA: Diagnosis not present

## 2016-08-02 DIAGNOSIS — R609 Edema, unspecified: Secondary | ICD-10-CM

## 2016-08-02 DIAGNOSIS — E119 Type 2 diabetes mellitus without complications: Secondary | ICD-10-CM | POA: Diagnosis not present

## 2016-08-02 DIAGNOSIS — C61 Malignant neoplasm of prostate: Secondary | ICD-10-CM

## 2016-08-02 MED ORDER — ABIRATERONE ACETATE 250 MG PO TABS: 1000.0000 mg | ORAL_TABLET | Freq: Every day | ORAL | 0 refills | Status: DC

## 2016-08-02 MED ORDER — PREDNISONE 5 MG PO TABS: 5.0000 mg | ORAL_TABLET | Freq: Every day | ORAL | 3 refills | Status: DC

## 2016-08-02 NOTE — Progress Notes (Signed)
Patient given educational packet on zytiga, script given to [facility], oral chemo navigator.

## 2016-08-02 NOTE — Progress Notes (Signed)
Hematology and Oncology Follow Up Visit  Kyle George [id number] Nov 07, 1950 66 y.o. 08/02/2016 3:57 PM Kyle George, MDSanders, Kyle Mech, MD   Principle Diagnosis: 66 year old gentleman diagnosed with prostate cancer on 09/08/2011. He had a Gleason score 4+5 = 9 and a PSA of 397. He subsequently developed widespread metastatic bony disease and lymphadenopathy in October 2015. His PSA at that time was 3779.   Prior Therapy: Status post androgen deprivation and initially with Kyle George subsequently with Lupron with a PSA dropping down to 500 and December 2015. He failed to follow-up with the PSA rising up to 1400 and April 2018.  Current therapy: Lupron 30 mg injection every 4 months. First injection given on 06/09/2016.  Interim History:  Kyle George presents today for a follow-up visit. Since the last visit, he received Lupron and tolerated it well. He reported excellent improvement in his overall health and improvement in his appetite. He is no longer reporting any pain in his performance status returned to baseline. He resumed driving and attending to her activities of daily living. He is not reporting any back pain or shoulder pain. He does not report any pathological fractures. He does not report any complications related to Lupron except for occasional hot flashes.   He does not report any headaches or blurry vision or syncope. He does not report any fevers, chills, sweats, weight loss or appetite changes.  He did not report any chest pain, palpitation, orthopnea or PND. He still has chronic left lower extremity edema. He does not report any cough, shortness of breath or hemoptysis. He does not report any nausea, vomiting, constipation, diarrhea or any changes in his bowel habits. He does not report any frequency, urgency hematuria or dysuria. The rest of his review of systems unremarkable.  Medications: I have reviewed the patient'Kyle current medications.  Current Outpatient Prescriptions   Medication Sig Dispense Refill  . aspirin 81 MG tablet Take 81 mg by mouth daily.    . Calcium Carbonate-Vitamin D (CALCIUM 600+D PO) Take 1 tablet by mouth daily.    . carvedilol (COREG) 6.25 MG tablet Take 6.25 mg by mouth 2 (two) times daily.  3  . cholecalciferol (VITAMIN D) 1000 UNITS tablet Take 5,000 Units by mouth daily.    . Insulin Detemir (LEVEMIR FLEXTOUCH Kyle George) Inject into the skin.    Marland Kitchen JANUVIA 100 MG tablet Take 100 mg by mouth daily.  3  . Multiple Vitamin (MULTIVITAMIN WITH MINERALS) TABS Take 1 tablet by mouth daily.    . NON FORMULARY Take 1 tablet by mouth daily. Thyroid  action    . pioglitazone-metformin (ACTOPLUS MET) 15-850 MG per tablet Take 1 tablet by mouth Twice daily.    . rosuvastatin (CRESTOR) 10 MG tablet Take 10 mg by mouth daily.    Kyle George 40-10-25 MG TABS Take 1 tablet by mouth Daily.    Marland Kitchen abiraterone Acetate (ZYTIGA) 250 MG tablet Take 4 tablets (1,000 mg total) by mouth daily. Take on an empty stomach 1 hour before or 2 hours after a meal 120 tablet 0  . predniSONE (DELTASONE) 5 MG tablet Take 1 tablet (5 mg total) by mouth daily with breakfast. 30 tablet 3   No current facility-administered medications for this visit.      Allergies: No Known Allergies  Past Medical History, Surgical history, Social history, and Family History were reviewed and updated.   Physical Exam: Blood pressure (!) 173/83, pulse 78, temperature 97.8 F (36.6 C), temperature source Oral, resp. rate 20,  height '5\' 9"'  (1.753 m), weight 192 lb 4.8 oz (87.2 kg), SpO2 100 %. ECOG: 0 General appearance: Well-appearing gentleman without distress. Head: Normocephalic, without obvious abnormality no oral pressure ulcers. Neck: no adenopathy Lymph nodes: Cervical, supraclavicular, and axillary nodes normal. Heart:regular rate and rhythm, S1, S2 normal, no murmur, click, rub or gallop Lung:chest clear, no wheezing, rales, normal symmetric air entry Abdomin: soft, non-tender,  without masses or organomegaly no rebound or guarding. EXT:no erythema, induration, or nodules   Lab Results: Lab Results  Component Value Date   WBC 8.9 07/31/2016   HGB 13.1 07/31/2016   HCT 39.5 07/31/2016   MCV 95.9 07/31/2016   PLT 200 07/31/2016            Impression and Plan:   66 year old gentleman with the following issues  1. Prostate cancer diagnosed in August 2013 after presenting with a PSA of 397. His initial biopsy showed a Gleason score of 4+5 = 9 with a predominant pattern 4+4 = 8 and 11 out of 12 cores. He was lost to follow-up and initiated therapy in October 2015 after his PSA was up to 3779. His bone scan showed metastatic bony disease and possibly he has pelvic adenopathy with chronic left lower extremity edema. His PSA dropped down to 500 after one month of androgen deprivation in the form of Firmagon.  His PSA in March 2016 was 78.  He has not received any therapy since that time and he reestablish care in April 2018. His PSA was up to 1200 and bone scan on 05/10/2016 showed widespread metastatic disease. He also had a CT scan of the abdomen and pelvis which showed pelvic adenopathy.  He is currently receiving Lupron every 4 months with excellent response to his PSA.  The risks and benefits of adding Zytiga were reviewed today. Complications associated with this medication include nausea, fatigue, hypokalemia, edema as well as adrenal insufficiency. The rationale for using Zytiga in this particular setting was discussed today given his bulky disease adding Zytiga to androgen deprivation therapy have shown to show significant improvement in overall disease control. He is agreeable to proceed with this therapy at this time.  2. Androgen depravation: He will receive a Lupron 30 mg every 4 months indefinitely.  3. Diabetes: Will need to monitor his blood sugar on prednisone. Prednisone can be discontinued of his IVs becomes an issue.  4. Bone health: He would  benefit from bone directed therapy such as Xgeva or Zometa after dental clearance. This will continue to be addressed in future visits.  5. Hip pain: Improved at this time after his disease under control..  6. Follow-up: Will be next 4-5 weeks to follow his progress.  Zola Button, MD 7/25/20183:57 PM

## 2016-08-02 NOTE — Telephone Encounter (Signed)
Gave patient avs report and appointments for September.  °

## 2016-08-03 ENCOUNTER — Telehealth: Payer: Self-pay | Admitting: Pharmacist

## 2016-08-03 ENCOUNTER — Telehealth: Payer: Self-pay | Admitting: Pharmacy Technician

## 2016-08-03 DIAGNOSIS — C61 Malignant neoplasm of prostate: Secondary | ICD-10-CM

## 2016-08-03 MED ORDER — ABIRATERONE ACETATE 250 MG PO TABS: 1000.0000 mg | ORAL_TABLET | Freq: Every day | ORAL | 0 refills | Status: DC

## 2016-08-03 MED FILL — ZYTIGA 250 MG TABLET: 250 | 30 days supply | Qty: 120 | Fill #0

## 2016-08-03 NOTE — Telephone Encounter (Signed)
Oral Chemotherapy Pharmacist Encounter   I spoke with patient in [facility] lobby for overview of new oral chemotherapy medication: Zytiga for the treatment of metastatic prostate cancer in conjunction with Lupron, planned duration until disease progression or unacceptable drug toxicity..   Pt is doing well. The prescription has been sent to the [facility] for benefit analysis and approval.   Counseled patient on administration, dosing, side effects, safe handling, and monitoring. Patient will take Zytiga 250mg  tablets, 4 tablets by mouth once daily on an empty stomach, 1 hour before or 2 hours after a meal. Patient states he will take his Zytiga 1st thing in the morning and then wait to eat. He will take his prednisone 5mg  tablets, 1 tablet by mouth once daily in the morning. Patient states he does not usually eat breakfast so he may take his prednisone with his Zytiga daily.  Side effects include but not limited to: fatigue, hot flush, edema,arthralgia, GI upset, and hypertension.    Reviewed with patient importance of keeping a medication schedule and plan for any missed doses. Reviewed importance of taking his blood pressure medications daily. He will periodically monitor his blood pressure and alert the office if it increases.  Kyle George voiced understanding and appreciation.   All questions answered.  Will follow up with patient regarding insurance and pharmacy.   Patient knows to call the office with questions or concerns. Oral Oncology Clinic will continue to follow.  Thank you,  Kyle George, PharmD, BCPS, BCOP 08/03/2016  4:13 PM Oral Oncology Clinic [phone]

## 2016-08-03 NOTE — Telephone Encounter (Signed)
Oral Oncology Patient Advocate Encounter  Prior Authorization for Kyle George has been approved.    PA# [id number] Effective dates: 08/03/2016 through 08/03/2018.   The patient's monthly copayment is $250.00.  I was able to secure the patient a copay card that will bring his out of pocket cost to $10 monthly.    I spoke with the patient and made arrangements for him to pick up his medication on 08-04-16 at [facility].    He is in agreement with the plan and knows to call the office with any questions or concerns.   Kyle George. Melynda Keller, [place] Oral Oncology Patient Advocate [phone] 08/03/2016 4:38 PM

## 2016-08-03 NOTE — Telephone Encounter (Signed)
Oral Oncology Pharmacist Encounter  Received new prescription for Zytiga for the treatment of metastatic prostate cancer in conjunction with Lupron, planned duration until disease progression or unacceptable drug toxicity.  CBC and CMP from 07/31/16 assessed, no abnormalities noted to prevent the start of therapy. Patient's BP at last clinic visit 08/02/16 was elevated. Per medication list patient is taking carvedilol and Tribenzor (olmesartan medoxomil/amlodipine/hydrochlorothiazide) for his blood pressure. It will be important to the patient to take his BP medication and monitor his BP. Spoke with MD and plan is to monitor BP and have patient monitor BP at home if possible.   Current medication list in Epic reviewed, DDIs with carvedilol and pioglitazone-metformin identified: - Carvedilol: (Risk rating D, Consider therapy modification) the concentration of coreg maybe increased by Zytiga. Per EPIC med list patient is on a low dose of carvedilol 6.25mg  bid. Based on the patient last BP/HR in clinic and the lower dose of carvedilol, plan to monitor BP to manage interaction for now. - Pioglitazone-metformin: (Risk rating C, monitor therapy) the concentration of the pioglitazone component may be increase. Monitor glucose and signs of hypoglycemia.   Prescription has been e-scribed to the [facility] for benefits analysis and approval.  Oral Oncology Clinic will continue to follow for insurance authorization, copayment issues, initial counseling and start date.  Attempted to reach patient for initial education of Zytiga. No answer. Left VM for patient to call back.  Thank you,  Nuala Alpha, PharmD, BCPS 08/03/2016 1:05 PM Oral Oncology Clinic [phone]

## 2016-08-03 NOTE — Telephone Encounter (Signed)
Oral Oncology Patient Advocate Encounter  Received notification from [place] that prior authorization for Kyle George is required.  PA submitted on CoverMyMeds Key [id number] Status is pending  Oral Oncology Clinic will continue to follow.  Kyle George. Melynda Keller, [place] Oral Oncology Clinic Patient Advocate [phone] 08/03/2016 12:12 PM

## 2016-08-28 ENCOUNTER — Other Ambulatory Visit: Payer: Self-pay | Admitting: Oncology

## 2016-08-28 DIAGNOSIS — C61 Malignant neoplasm of prostate: Secondary | ICD-10-CM

## 2016-08-28 MED FILL — ZYTIGA 250 MG TABLET: 250 | 30 days supply | Qty: 120 | Fill #0

## 2016-09-05 ENCOUNTER — Other Ambulatory Visit: Payer: Self-pay | Admitting: Pharmacist

## 2016-09-05 DIAGNOSIS — C61 Malignant neoplasm of prostate: Secondary | ICD-10-CM

## 2016-09-05 MED ORDER — PREDNISONE 5 MG PO TABS: 5.0000 mg | ORAL_TABLET | Freq: Every day | ORAL | 3 refills | Status: DC

## 2016-09-05 MED FILL — predniSONE 5 MG TABS: 5 | 30 days supply | Qty: 30 | Fill #0

## 2016-09-12 ENCOUNTER — Telehealth: Payer: Self-pay | Admitting: Oncology

## 2016-09-12 ENCOUNTER — Other Ambulatory Visit: Payer: BC Managed Care – PPO

## 2016-09-12 ENCOUNTER — Ambulatory Visit: Payer: BC Managed Care – PPO | Admitting: Oncology

## 2016-09-12 NOTE — Telephone Encounter (Signed)
Patient forgot about appt today, so I spoke with him and rescheduled it for 9/14.

## 2016-09-22 ENCOUNTER — Ambulatory Visit: Payer: BC Managed Care – PPO | Admitting: Oncology

## 2016-09-22 ENCOUNTER — Other Ambulatory Visit: Payer: BC Managed Care – PPO

## 2016-09-28 ENCOUNTER — Ambulatory Visit (HOSPITAL_BASED_OUTPATIENT_CLINIC_OR_DEPARTMENT_OTHER): Payer: Medicare Other

## 2016-09-28 ENCOUNTER — Telehealth: Payer: Self-pay | Admitting: Oncology

## 2016-09-28 ENCOUNTER — Ambulatory Visit (HOSPITAL_BASED_OUTPATIENT_CLINIC_OR_DEPARTMENT_OTHER): Payer: Medicare Other | Admitting: Oncology

## 2016-09-28 VITALS — BP 174/83 | HR 77 | Temp 98.5°F | Resp 17 | Ht 69.0 in | Wt 203.4 lb

## 2016-09-28 DIAGNOSIS — C7951 Secondary malignant neoplasm of bone: Secondary | ICD-10-CM

## 2016-09-28 DIAGNOSIS — R61 Generalized hyperhidrosis: Secondary | ICD-10-CM | POA: Diagnosis not present

## 2016-09-28 DIAGNOSIS — R609 Edema, unspecified: Secondary | ICD-10-CM | POA: Diagnosis not present

## 2016-09-28 DIAGNOSIS — C61 Malignant neoplasm of prostate: Secondary | ICD-10-CM

## 2016-09-28 DIAGNOSIS — E291 Testicular hypofunction: Secondary | ICD-10-CM

## 2016-09-28 DIAGNOSIS — E119 Type 2 diabetes mellitus without complications: Secondary | ICD-10-CM | POA: Diagnosis not present

## 2016-09-28 LAB — CBC WITH DIFFERENTIAL/PLATELET
BASO%: 1.3 % (ref 0.0–2.0)
Basophils Absolute: 0.1 10*3/uL (ref 0.0–0.1)
EOS%: 4.5 % (ref 0.0–7.0)
Eosinophils Absolute: 0.4 10*3/uL (ref 0.0–0.5)
HCT: 41.7 % (ref 38.4–49.9)
HGB: 14 g/dL (ref 13.0–17.1)
LYMPH%: 11.5 % — ABNORMAL LOW (ref 14.0–49.0)
MCH: 33.1 pg (ref 27.2–33.4)
MCHC: 33.7 g/dL (ref 32.0–36.0)
MCV: 98.4 fL — ABNORMAL HIGH (ref 79.3–98.0)
MONO#: 0.4 10*3/uL (ref 0.1–0.9)
MONO%: 4.6 % (ref 0.0–14.0)
NEUT#: 7.3 10*3/uL — ABNORMAL HIGH (ref 1.5–6.5)
NEUT%: 78.1 % — ABNORMAL HIGH (ref 39.0–75.0)
Platelets: 144 10*3/uL (ref 140–400)
RBC: 4.23 10*6/uL (ref 4.20–5.82)
RDW: 16.6 % — ABNORMAL HIGH (ref 11.0–14.6)
WBC: 9.4 10*3/uL (ref 4.0–10.3)
lymph#: 1.1 10*3/uL (ref 0.9–3.3)

## 2016-09-28 LAB — COMPREHENSIVE METABOLIC PANEL
ALT: 30 U/L (ref 0–55)
AST: 24 U/L (ref 5–34)
Albumin: 3.6 g/dL (ref 3.5–5.0)
Alkaline Phosphatase: 127 U/L (ref 40–150)
Anion Gap: 9 mEq/L (ref 3–11)
BUN: 13.2 mg/dL (ref 7.0–26.0)
CO2: 26 mEq/L (ref 22–29)
Calcium: 9.4 mg/dL (ref 8.4–10.4)
Chloride: 106 mEq/L (ref 98–109)
Creatinine: 0.8 mg/dL (ref 0.7–1.3)
EGFR: >90 mL/min/{1.73_m2} (ref >90)
Glucose: 145 mg/dl — ABNORMAL HIGH (ref 70–140)
Potassium: 3.6 mEq/L (ref 3.5–5.1)
Sodium: 141 mEq/L (ref 136–145)
Total Bilirubin: 0.45 mg/dL (ref 0.20–1.20)
Total Protein: 7.6 g/dL (ref 6.4–8.3)

## 2016-09-28 NOTE — Telephone Encounter (Signed)
Gave avs and calendar for October and November  °

## 2016-09-28 NOTE — Progress Notes (Signed)
Hematology and Oncology Follow Up Visit  Kyle George [id number] 1950-05-05 66 y.o. 09/28/2016 10:08 AM Kyle George, MDSanders, Bailey Mech, MD   Principle Diagnosis: 66 year old gentleman diagnosed with prostate cancer on 09/08/2011. He had a Gleason score 4+5 = 9 and a PSA of 397. He subsequently developed widespread metastatic bony disease and lymphadenopathy in October 2015. His PSA at that time was 3779.   Prior Therapy: Status post androgen deprivation and initially with [place] subsequently with Lupron with a PSA dropping down to 500 and December 2015. He failed to follow-up with the PSA rising up to 1400 and April 2018.  Current therapy:  Lupron 30 mg injection every 4 months. First injection given on 06/09/2016. Zytiga 1000 mg daily with prednisone at 5 mg daily started in August 2018.  Interim History:  Kyle George presents today for a follow-up visit. Since the last visit, he started Zytiga and continues to tolerate it well. He reported excellent improvement in his overall health and improvement in his appetite. He continues to gain weight and attends to activities of daily living. He denied any back pain, hip pain or discomfort. He does not report any pathological fractures. He does not report any complications related to Lupron except for occasional hot flashes. He denied any nausea, edema or excessive fatigue. He has no difficulty obtaining Zytiga at this time.   He does not report any headaches or blurry vision or syncope. He does not report any fevers, chills, sweats, weight loss or appetite changes.  He did not report any chest pain, palpitation, orthopnea or PND. He still has chronic left lower extremity edema. He does not report any cough, shortness of breath or hemoptysis. He does not report any nausea, vomiting, constipation, diarrhea or any changes in his bowel habits. He does not report any frequency, urgency hematuria or dysuria. The rest of his review of systems  unremarkable.  Medications: I have reviewed the patient's current medications.  Current Outpatient Prescriptions  Medication Sig Dispense Refill  . aspirin 81 MG tablet Take 81 mg by mouth daily.    . Calcium Carbonate-Vitamin D (CALCIUM 600+D PO) Take 1 tablet by mouth daily.    . carvedilol (COREG) 6.25 MG tablet Take 6.25 mg by mouth 2 (two) times daily.  3  . cholecalciferol (VITAMIN D) 1000 UNITS tablet Take 5,000 Units by mouth daily.    . Insulin Detemir (LEVEMIR FLEXTOUCH [place]) Inject into the skin.    Marland Kitchen JANUVIA 100 MG tablet Take 100 mg by mouth daily.  3  . Multiple Vitamin (MULTIVITAMIN WITH MINERALS) TABS Take 1 tablet by mouth daily.    . NON FORMULARY Take 1 tablet by mouth daily. Thyroid  action    . pioglitazone-metformin (ACTOPLUS MET) 15-850 MG per tablet Take 1 tablet by mouth Twice daily.    . predniSONE (DELTASONE) 5 MG tablet Take 1 tablet (5 mg total) by mouth daily with breakfast. 30 tablet 3  . rosuvastatin (CRESTOR) 10 MG tablet Take 10 mg by mouth daily.    Kyle George 40-10-25 MG TABS Take 1 tablet by mouth Daily.    Marland Kitchen ZYTIGA 250 MG tablet TAKE 4 TABLETS (1,000 MG TOTAL) BY MOUTH DAILY. TAKE ON AN EMPTY STOMACH 1 HOUR BEFORE OR 2 HOURS AFTER A MEAL 120 tablet 0   No current facility-administered medications for this visit.      Allergies: No Known Allergies  Past Medical History, Surgical history, Social history, and Family History were reviewed and updated.   Physical  Exam: Blood pressure (!) 174/83, pulse 77, temperature 98.5 F (36.9 C), temperature source Oral, resp. rate 17, height _0  (1.753 m), weight 203 lb 6.4 oz (92.3 kg), SpO2 98 %. ECOG: 0 General appearance: Alert, awake gentleman without distress. Head: Normocephalic, without obvious abnormality no oral ulcers or thrush. Neck: no adenopathy no masses or lesions. Lymph nodes: Cervical, supraclavicular, and axillary nodes normal. Heart:regular rate and rhythm, S1, S2 normal, no murmur,  click, rub or gallop Lung:chest clear, no wheezing, rales, normal symmetric air entry Abdomin: soft, non-tender, without masses or organomegaly no shifting dullness or ascites. EXT:no erythema, induration, or nodules   Lab Results: Lab Results  Component Value Date   WBC 8.9 07/31/2016   HGB 13.1 07/31/2016   HCT 39.5 07/31/2016   MCV 95.9 07/31/2016   PLT 200 07/31/2016            Results for Kyle George (MRN [id number]) as of 09/28/2016 10:10  Ref. Range 07/31/2016 15:12  Prostate Specific Ag, Serum Latest Ref Range: 0.0 - 4.0 ng/mL 131.6 (H)    Impression and Plan:   66 year old gentleman with the following issues  1. Prostate cancer diagnosed in August 2013 after presenting with a PSA of 397. His initial biopsy showed a Gleason score of 4+5 = 9 with a predominant pattern 4+4 = 8 and 11 out of 12 cores. He was lost to follow-up and initiated therapy in October 2015 after his PSA was up to 3779. His bone scan showed metastatic bony disease and possibly he has pelvic adenopathy with chronic left lower extremity edema. His PSA dropped down to 500 after one month of androgen deprivation in the form of Firmagon.  His PSA in March 2016 was 78.  He has not received any therapy since that time and he reestablish care in April 2018. His PSA was up to 1200 and bone scan on 05/10/2016 showed widespread metastatic disease. He also had a CT scan of the abdomen and pelvis which showed pelvic adenopathy.  He is currently receiving Lupron every 4 months with PSA decline to 131 in July 2018.  He is currently receiving Zytiga and has tolerated it well. The plan is to continue with the same dose and schedule and continue to monitor his PSA.  2. Androgen depravation: He will receive a Lupron 30 mg every 4 months indefinitely. This will be repeated in October 2018.  3. Diabetes: Will need to monitor his blood sugar on prednisone. Prednisone can be discontinued if his blood sugar is  elevated.  4. Bone health: He would benefit from bone directed therapy such as Xgeva or Zometa after dental clearance. This will continue to be addressed in future visits.  5. Hip pain: Resolved at this time.  6. Follow-up: Will be next 6 weeks to follow his progress.  Zola Button, MD 9/20/201810:08 AM

## 2016-09-29 ENCOUNTER — Other Ambulatory Visit: Payer: Self-pay | Admitting: Oncology

## 2016-09-29 DIAGNOSIS — C61 Malignant neoplasm of prostate: Secondary | ICD-10-CM

## 2016-09-29 LAB — PSA: Prostate Specific Ag, Serum: 1.6 ng/mL (ref 0.0–4.0)

## 2016-09-29 MED FILL — ZYTIGA 250 MG TABLET: 250 | 30 days supply | Qty: 120 | Fill #0

## 2016-10-10 ENCOUNTER — Ambulatory Visit: Payer: BC Managed Care – PPO

## 2016-10-10 ENCOUNTER — Telehealth: Payer: Self-pay | Admitting: Oncology

## 2016-10-10 NOTE — Telephone Encounter (Signed)
Spoke with patient regarding his missed appt - and rescheduled it for tomorrow.

## 2016-10-11 ENCOUNTER — Ambulatory Visit (HOSPITAL_BASED_OUTPATIENT_CLINIC_OR_DEPARTMENT_OTHER): Payer: Medicare Other

## 2016-10-11 VITALS — BP 156/87 | HR 78 | Temp 98.2°F | Resp 16

## 2016-10-11 DIAGNOSIS — Z5111 Encounter for antineoplastic chemotherapy: Secondary | ICD-10-CM

## 2016-10-11 DIAGNOSIS — C61 Malignant neoplasm of prostate: Secondary | ICD-10-CM | POA: Diagnosis present

## 2016-10-11 DIAGNOSIS — C7951 Secondary malignant neoplasm of bone: Secondary | ICD-10-CM | POA: Diagnosis not present

## 2016-10-11 MED ORDER — LEUPROLIDE ACETATE (4 MONTH) 30 MG IM KIT
30.0000 mg | PACK | Freq: Once | INTRAMUSCULAR | Status: AC
Start: 2016-10-11 — End: 2016-10-11
  Administered 2016-10-11: 30 mg via INTRAMUSCULAR
  Filled 2016-10-11: qty 30

## 2016-10-23 ENCOUNTER — Other Ambulatory Visit: Payer: Self-pay | Admitting: Pharmacist

## 2016-10-23 ENCOUNTER — Other Ambulatory Visit: Payer: Self-pay | Admitting: Oncology

## 2016-10-23 DIAGNOSIS — C61 Malignant neoplasm of prostate: Secondary | ICD-10-CM

## 2016-10-27 MED FILL — ZYTIGA 250 MG TABLET: 250 | 30 days supply | Qty: 120 | Fill #0

## 2016-10-30 DIAGNOSIS — E1122 Type 2 diabetes mellitus with diabetic chronic kidney disease: Secondary | ICD-10-CM | POA: Diagnosis not present

## 2016-10-30 DIAGNOSIS — Z23 Encounter for immunization: Secondary | ICD-10-CM | POA: Diagnosis not present

## 2016-10-30 DIAGNOSIS — I129 Hypertensive chronic kidney disease with stage 1 through stage 4 chronic kidney disease, or unspecified chronic kidney disease: Secondary | ICD-10-CM | POA: Diagnosis not present

## 2016-10-30 DIAGNOSIS — Z0000 Encounter for general adult medical examination without abnormal findings: Secondary | ICD-10-CM | POA: Diagnosis not present

## 2016-10-30 DIAGNOSIS — N08 Glomerular disorders in diseases classified elsewhere: Secondary | ICD-10-CM | POA: Diagnosis not present

## 2016-10-30 DIAGNOSIS — N181 Chronic kidney disease, stage 1: Secondary | ICD-10-CM | POA: Diagnosis not present

## 2016-10-30 DIAGNOSIS — C61 Malignant neoplasm of prostate: Secondary | ICD-10-CM | POA: Diagnosis not present

## 2016-11-14 ENCOUNTER — Ambulatory Visit (HOSPITAL_BASED_OUTPATIENT_CLINIC_OR_DEPARTMENT_OTHER): Payer: Medicare Other | Admitting: Oncology

## 2016-11-14 ENCOUNTER — Other Ambulatory Visit (HOSPITAL_BASED_OUTPATIENT_CLINIC_OR_DEPARTMENT_OTHER): Payer: Medicare Other

## 2016-11-14 ENCOUNTER — Telehealth: Payer: Self-pay | Admitting: Oncology

## 2016-11-14 VITALS — BP 166/85 | HR 76 | Temp 98.0°F | Resp 20 | Ht 69.0 in | Wt 216.6 lb

## 2016-11-14 DIAGNOSIS — C7951 Secondary malignant neoplasm of bone: Secondary | ICD-10-CM

## 2016-11-14 DIAGNOSIS — C61 Malignant neoplasm of prostate: Secondary | ICD-10-CM

## 2016-11-14 DIAGNOSIS — E291 Testicular hypofunction: Secondary | ICD-10-CM | POA: Diagnosis not present

## 2016-11-14 DIAGNOSIS — E119 Type 2 diabetes mellitus without complications: Secondary | ICD-10-CM

## 2016-11-14 LAB — CBC WITH DIFFERENTIAL/PLATELET
BASO%: 0.3 % (ref 0.0–2.0)
Basophils Absolute: 0 10*3/uL (ref 0.0–0.1)
EOS%: 4.3 % (ref 0.0–7.0)
Eosinophils Absolute: 0.4 10*3/uL (ref 0.0–0.5)
HCT: 39.6 % (ref 38.4–49.9)
HGB: 13.2 g/dL (ref 13.0–17.1)
LYMPH%: 20.6 % (ref 14.0–49.0)
MCH: 33.3 pg (ref 27.2–33.4)
MCHC: 33.3 g/dL (ref 32.0–36.0)
MCV: 100 fL — ABNORMAL HIGH (ref 79.3–98.0)
MONO#: 0.4 10*3/uL (ref 0.1–0.9)
MONO%: 4.4 % (ref 0.0–14.0)
NEUT#: 6.5 10*3/uL (ref 1.5–6.5)
NEUT%: 70.4 % (ref 39.0–75.0)
Platelets: 159 10*3/uL (ref 140–400)
RBC: 3.96 10*6/uL — ABNORMAL LOW (ref 4.20–5.82)
RDW: 15.1 % — ABNORMAL HIGH (ref 11.0–14.6)
WBC: 9.2 10*3/uL (ref 4.0–10.3)
lymph#: 1.9 10*3/uL (ref 0.9–3.3)

## 2016-11-14 LAB — COMPREHENSIVE METABOLIC PANEL
ALT: 16 U/L (ref 0–55)
AST: 17 U/L (ref 5–34)
Albumin: 3.6 g/dL (ref 3.5–5.0)
Alkaline Phosphatase: 86 U/L (ref 40–150)
Anion Gap: 9 mEq/L (ref 3–11)
BUN: 12.8 mg/dL (ref 7.0–26.0)
CO2: 26 mEq/L (ref 22–29)
Calcium: 9.4 mg/dL (ref 8.4–10.4)
Chloride: 106 mEq/L (ref 98–109)
Creatinine: 0.8 mg/dL (ref 0.7–1.3)
EGFR: >60 mL/min/{1.73_m2} (ref >60)
Glucose: 110 mg/dl (ref 70–140)
Potassium: 3.6 mEq/L (ref 3.5–5.1)
Sodium: 140 mEq/L (ref 136–145)
Total Bilirubin: 0.3 mg/dL (ref 0.20–1.20)
Total Protein: 7.5 g/dL (ref 6.4–8.3)

## 2016-11-14 NOTE — Progress Notes (Signed)
Hematology and Oncology Follow Up Visit  Kyle George [id number] [birth year] 66 y.o. 11/14/2016 4:10 PM Kyle George, MDSanders, Bailey Mech, MD   Principle Diagnosis: 66 year old gentleman diagnosed with prostate cancer on 09/08/2011. He had a Gleason score 4+5 = 9 and a PSA of 397. He subsequently developed widespread metastatic bony disease and lymphadenopathy in October 2015. His PSA at that time was 3779.   Prior Therapy: Status post androgen deprivation and initially with Kyle George subsequently with Lupron with a PSA dropping down to 500 and December 2015. He failed to follow-up with the PSA rising up to 1400 and April 2018.  Current therapy:  Lupron 30 mg injection every 4 months.  Next injection is due in February 2019.  Zytiga 1000 mg daily with prednisone at 5 mg daily started in August 2018.  Interim History:  Kyle George presents today for a follow-up visit. Since the last visit, he continues to do well without any recent complaints.  He is taking Zytiga and continues to tolerate it well.  He denies any nausea, fatigue or edema.  He denied any back pain, hip pain or discomfort. He does not report any pathological fractures. He does not report any complications related to Lupron except for occasional hot flashes.  He is quite compliant with this medication and does not report missing doses.  He denied any excessive fatigue or tiredness and continues to attend to activities of daily living.   He does not report any headaches or blurry vision or syncope. He does not report any fevers, chills, sweats, weight loss or appetite changes.  He did not report any chest pain, palpitation, orthopnea or PND. He still has chronic left lower extremity edema. He does not report any cough, shortness of breath or hemoptysis. He does not report any nausea, vomiting, constipation, diarrhea or any changes in his bowel habits. He does not report any frequency, urgency hematuria or dysuria. The rest of his review  of systems unremarkable.  Medications: I have reviewed the patient's current medications.  Current Outpatient Medications  Medication Sig Dispense Refill  . aspirin 81 MG tablet Take 81 mg by mouth daily.    . Calcium Carbonate-Vitamin D (CALCIUM 600+D PO) Take 1 tablet by mouth daily.    . carvedilol (COREG) 6.25 MG tablet Take 6.25 mg by mouth 2 (two) times daily.  3  . cholecalciferol (VITAMIN D) 1000 UNITS tablet Take 5,000 Units by mouth daily.    . Insulin Detemir (LEVEMIR FLEXTOUCH [place]) Inject into the skin.    Marland Kitchen JANUVIA 100 MG tablet Take 100 mg by mouth daily.  3  . Multiple Vitamin (MULTIVITAMIN WITH MINERALS) TABS Take 1 tablet by mouth daily.    . NON FORMULARY Take 1 tablet by mouth daily. Thyroid  action    . pioglitazone-metformin (ACTOPLUS MET) 15-850 MG per tablet Take 1 tablet by mouth Twice daily.    . predniSONE (DELTASONE) 5 MG tablet Take 1 tablet (5 mg total) by mouth daily with breakfast. 30 tablet 3  . rosuvastatin (CRESTOR) 10 MG tablet Take 10 mg by mouth daily.    Kyle George 40-10-25 MG TABS Take 1 tablet by mouth Daily.    Marland Kitchen ZYTIGA 250 MG tablet TAKE 4 TABLETS (1,000 MG TOTAL) BY MOUTH DAILY. TAKE ON AN EMPTY STOMACH 1 HOUR BEFORE OR 2 HOURS AFTER A MEAL 120 tablet 0   No current facility-administered medications for this visit.      Allergies: No Known Allergies  Past Medical History, Surgical  history, Social history, and Family History were reviewed and updated.   Physical Exam: Blood pressure (!) 166/85, pulse 76, temperature 98 F (36.7 C), temperature source Oral, resp. rate 20, height '5\' 9"'  (1.753 m), weight 216 lb 9.6 oz (98.2 kg), SpO2 99 %. ECOG: 0 General appearance: Well-appearing gentleman without distress. Head: Normocephalic, without obvious abnormality no oral ulcers or lesions. Neck: no adenopathy no masses or lesions. Lymph nodes: Cervical, supraclavicular, and axillary nodes normal. Heart:regular rate and rhythm, S1, S2 normal, no  murmur, click, rub or gallop Lung:chest clear, no wheezing, rales, normal symmetric air entry Abdomin: soft, non-tender, without masses or organomegaly no rebound or guarding. EXT:no erythema, induration, or nodules   Lab Results: Lab Results  Component Value Date   WBC 9.2 11/14/2016   HGB 13.2 11/14/2016   HCT 39.6 11/14/2016   MCV 100.0 (H) 11/14/2016   PLT 159 11/14/2016            Results for Kyle George (MRN [id number]) as of 11/14/2016 15:50  Ref. Range 07/31/2016 15:12 09/28/2016 10:45  Prostate Specific Ag, Serum Latest Ref Range: 0.0 - 4.0 ng/mL 131.6 (H) 1.6     Impression and Plan:   66 year old gentleman with the following issues  1. Prostate cancer diagnosed in August 2013 after presenting with a PSA of 397. His initial biopsy showed a Gleason score of 4+5 = 9 with a predominant pattern 4+4 = 8 and 11 out of 12 cores. He was lost to follow-up and initiated therapy in October 2015 after his PSA was up to 3779. His bone scan showed metastatic bony disease and possibly he has pelvic adenopathy with chronic left lower extremity edema. His PSA dropped down to 500 after one month of androgen deprivation in the form of [place].  His PSA in March 2016 was 78.  He has not received any therapy since that time and he reestablish care in April 2018. His PSA was up to 1200 and bone scan on 05/10/2016 showed widespread metastatic disease. He also had a CT scan of the abdomen and pelvis which showed pelvic adenopathy.  He is currently receiving Lupron every 4 months with PSA decline to 131 in July 2018.  Zytiga started in August 2018 and continues to tolerate it well.  His PSA is down to 1.6 with excellent improvement in his overall quality of life and performance status.  Risks and benefits of continuing this medication was discussed today and is agreeable to continue.  2. Androgen depravation: He will receive a Lupron 30 mg every 4 months indefinitely. This will be repeated  in February 2018.  3. Diabetes: Will need to monitor his blood sugar on prednisone. Prednisone can be discontinued if his blood sugar is elevated.  4. Bone health: He would benefit from bone directed therapy such as Xgeva or Zometa after dental clearance.  This has not been completed yet and will continue to address with him moving forward.  5.  Pain: His pain has resolved at this time including his previous hip pain.  6. Follow-up: Will be in 3 months.  Zola Button, MD 11/6/20184:10 PM

## 2016-11-14 NOTE — Telephone Encounter (Signed)
Gave avs and calendar for February 2019 °

## 2016-11-15 LAB — PSA: Prostate Specific Ag, Serum: 0.5 ng/mL (ref 0.0–4.0)

## 2016-11-20 ENCOUNTER — Other Ambulatory Visit: Payer: Self-pay | Admitting: Oncology

## 2016-11-20 DIAGNOSIS — C61 Malignant neoplasm of prostate: Secondary | ICD-10-CM

## 2016-11-29 MED FILL — ZYTIGA 250 MG TABLET: 250 | 30 days supply | Qty: 120 | Fill #0

## 2016-12-05 ENCOUNTER — Other Ambulatory Visit: Payer: Self-pay | Admitting: Oncology

## 2016-12-05 DIAGNOSIS — C61 Malignant neoplasm of prostate: Secondary | ICD-10-CM

## 2016-12-21 ENCOUNTER — Other Ambulatory Visit: Payer: Self-pay | Admitting: Oncology

## 2016-12-21 DIAGNOSIS — C61 Malignant neoplasm of prostate: Secondary | ICD-10-CM

## 2016-12-27 MED FILL — ZYTIGA 250 MG TABLET: 250 | 30 days supply | Qty: 120 | Fill #0

## 2017-01-18 ENCOUNTER — Other Ambulatory Visit: Payer: Self-pay | Admitting: Oncology

## 2017-01-18 DIAGNOSIS — C61 Malignant neoplasm of prostate: Secondary | ICD-10-CM

## 2017-02-02 MED FILL — ZYTIGA 250 MG TABLET: 250 | 30 days supply | Qty: 120 | Fill #0

## 2017-02-06 ENCOUNTER — Telehealth: Payer: Self-pay | Admitting: Pharmacy Technician

## 2017-02-06 NOTE — Telephone Encounter (Signed)
Oral Oncology Patient Advocate Encounter  Was successful in securing patient a $ 7500 grant from Patient [facility]) to provide copayment coverage for his Zytiga.  This will keep the out of pocket expense at $0.    I have spoken with the patient.    The billing information is as follows and has been shared with [name].   Member ID: [id number] Group ID: [id number] RxBin: [id number] Dates of Eligibility: 11/08/2016 through 02/05/2018  Kyle George. Melynda Keller, [place] Patient [place] [phone] 02/06/2017 3:07 PM

## 2017-02-13 ENCOUNTER — Encounter: Payer: Self-pay | Admitting: *Deleted

## 2017-02-13 ENCOUNTER — Telehealth: Payer: Self-pay | Admitting: Medical Oncology

## 2017-02-13 DIAGNOSIS — C61 Malignant neoplasm of prostate: Secondary | ICD-10-CM

## 2017-02-13 NOTE — Telephone Encounter (Signed)
Spoke with [name] to see if he would be willing to participate in a prostate research blood draw. I informed he one of the research nurses will obtain a consent and he will receive a gift card for participating. He states he is interested. I asked him to arrive at 11:00 am. He voiced understanding.

## 2017-02-14 ENCOUNTER — Inpatient Hospital Stay: Payer: Medicare Other

## 2017-02-14 ENCOUNTER — Telehealth: Payer: Self-pay | Admitting: Oncology

## 2017-02-14 ENCOUNTER — Inpatient Hospital Stay: Payer: Medicare Other | Admitting: *Deleted

## 2017-02-14 ENCOUNTER — Inpatient Hospital Stay: Payer: Medicare Other | Attending: Oncology | Admitting: Oncology

## 2017-02-14 VITALS — BP 168/89 | HR 81 | Temp 99.3°F | Resp 20 | Ht 69.0 in | Wt 216.9 lb

## 2017-02-14 DIAGNOSIS — Z794 Long term (current) use of insulin: Secondary | ICD-10-CM

## 2017-02-14 DIAGNOSIS — I10 Essential (primary) hypertension: Secondary | ICD-10-CM | POA: Insufficient documentation

## 2017-02-14 DIAGNOSIS — Z79818 Long term (current) use of other agents affecting estrogen receptors and estrogen levels: Secondary | ICD-10-CM | POA: Diagnosis not present

## 2017-02-14 DIAGNOSIS — C778 Secondary and unspecified malignant neoplasm of lymph nodes of multiple regions: Secondary | ICD-10-CM

## 2017-02-14 DIAGNOSIS — Z7982 Long term (current) use of aspirin: Secondary | ICD-10-CM | POA: Diagnosis not present

## 2017-02-14 DIAGNOSIS — C61 Malignant neoplasm of prostate: Secondary | ICD-10-CM

## 2017-02-14 DIAGNOSIS — Z79899 Other long term (current) drug therapy: Secondary | ICD-10-CM | POA: Insufficient documentation

## 2017-02-14 DIAGNOSIS — E119 Type 2 diabetes mellitus without complications: Secondary | ICD-10-CM | POA: Insufficient documentation

## 2017-02-14 DIAGNOSIS — C7951 Secondary malignant neoplasm of bone: Secondary | ICD-10-CM | POA: Diagnosis not present

## 2017-02-14 LAB — COMPREHENSIVE METABOLIC PANEL
ALT: 12 U/L (ref 0–55)
AST: 13 U/L (ref 5–34)
Albumin: 3.5 g/dL (ref 3.5–5.0)
Alkaline Phosphatase: 77 U/L (ref 40–150)
Anion gap: 9 (ref 3–11)
BUN: 17 mg/dL (ref 7–26)
CO2: 26 mmol/L (ref 22–29)
Calcium: 9.6 mg/dL (ref 8.4–10.4)
Chloride: 107 mmol/L (ref 98–109)
Creatinine, Ser: 0.84 mg/dL (ref 0.70–1.30)
GFR calc Af Amer: >60 mL/min (ref >60)
GFR calc non Af Amer: >60 mL/min (ref >60)
Glucose, Bld: 145 mg/dL — ABNORMAL HIGH (ref 70–140)
Potassium: 4 mmol/L (ref 3.5–5.1)
Sodium: 142 mmol/L (ref 136–145)
Total Bilirubin: 0.3 mg/dL (ref 0.2–1.2)
Total Protein: 7.3 g/dL (ref 6.4–8.3)

## 2017-02-14 LAB — CBC WITH DIFFERENTIAL/PLATELET
Basophils Absolute: 0.1 10*3/uL (ref 0.0–0.1)
Basophils Relative: 1 %
Eosinophils Absolute: 0.3 10*3/uL (ref 0.0–0.5)
Eosinophils Relative: 5 %
HCT: 38.8 % (ref 38.4–49.9)
Hemoglobin: 12.8 g/dL — ABNORMAL LOW (ref 13.0–17.1)
Lymphocytes Relative: 16 %
Lymphs Abs: 1.2 10*3/uL (ref 0.9–3.3)
MCH: 32.2 pg (ref 27.2–33.4)
MCHC: 32.9 g/dL (ref 32.0–36.0)
MCV: 97.8 fL (ref 79.3–98.0)
Monocytes Absolute: 0.5 10*3/uL (ref 0.1–0.9)
Monocytes Relative: 6 %
Neutro Abs: 5.2 10*3/uL (ref 1.5–6.5)
Neutrophils Relative %: 72 %
Platelets: 162 10*3/uL (ref 140–400)
RBC: 3.97 MIL/uL — ABNORMAL LOW (ref 4.20–5.82)
RDW: 15 % — ABNORMAL HIGH (ref 11.0–14.6)
WBC: 7.2 10*3/uL (ref 4.0–10.3)

## 2017-02-14 LAB — RESEARCH LABS

## 2017-02-14 MED ORDER — LEUPROLIDE ACETATE (4 MONTH) 30 MG IM KIT
30.0000 mg | PACK | Freq: Once | INTRAMUSCULAR | Status: AC
  Administered 2017-02-14: 30 mg via INTRAMUSCULAR
  Filled 2017-02-14: qty 30

## 2017-02-14 NOTE — Patient Instructions (Signed)
Leuprolide depot injection What is this medicine? LEUPROLIDE (loo PROE lide) is a man-made protein that acts like a natural hormone in the body. It decreases testosterone in men and decreases estrogen in women. In men, this medicine is used to treat advanced prostate cancer. In women, some forms of this medicine may be used to treat endometriosis, uterine fibroids, or other male hormone-related problems. This medicine may be used for other purposes; ask your health care provider or pharmacist if you have questions. COMMON BRAND NAME(S): Eligard, Lupron Depot, Lupron Depot-Ped, Viadur What should I tell my health care provider before I take this medicine? They need to know if you have any of these conditions: -diabetes -heart disease or previous heart attack -high blood pressure -high cholesterol -mental illness -osteoporosis -pain or difficulty passing urine -seizures -spinal cord metastasis -stroke -suicidal thoughts, plans, or attempt; a previous suicide attempt by you or a family member -tobacco smoker -unusual vaginal bleeding (women) -an unusual or allergic reaction to leuprolide, benzyl alcohol, other medicines, foods, dyes, or preservatives -pregnant or trying to get pregnant -breast-feeding How should I use this medicine? This medicine is for injection into a muscle or for injection under the skin. It is given by a health care professional in a hospital or clinic setting. The specific product will determine how it will be given to you. Make sure you understand which product you receive and how often you will receive it. Talk to your pediatrician regarding the use of this medicine in children. Special care may be needed. Overdosage: If you think you have taken too much of this medicine contact a poison control center or emergency room at once. NOTE: This medicine is only for you. Do not share this medicine with others. What if I miss a dose? It is important not to miss a dose.  Call your doctor or health care professional if you are unable to keep an appointment. Depot injections: Depot injections are given either once-monthly, every 12 weeks, every 16 weeks, or every 24 weeks depending on the product you are prescribed. The product you are prescribed will be based on if you are male or male, and your condition. Make sure you understand your product and dosing. What may interact with this medicine? Do not take this medicine with any of the following medications: -chasteberry This medicine may also interact with the following medications: -herbal or dietary supplements, like black cohosh or DHEA -male hormones, like estrogens or progestins and birth control pills, patches, rings, or injections -male hormones, like testosterone This list may not describe all possible interactions. Give your health care provider a list of all the medicines, herbs, non-prescription drugs, or dietary supplements you use. Also tell them if you smoke, drink alcohol, or use illegal drugs. Some items may interact with your medicine. What should I watch for while using this medicine? Visit your doctor or health care professional for regular checks on your progress. During the first weeks of treatment, your symptoms may get worse, but then will improve as you continue your treatment. You may get hot flashes, increased bone pain, increased difficulty passing urine, or an aggravation of nerve symptoms. Discuss these effects with your doctor or health care professional, some of them may improve with continued use of this medicine. Male patients may experience a menstrual cycle or spotting during the first months of therapy with this medicine. If this continues, contact your doctor or health care professional. What side effects may I notice from receiving this medicine? Side   effects that you should report to your doctor or health care professional as soon as possible: -allergic reactions like skin  rash, itching or hives, swelling of the face, lips, or tongue -breathing problems -chest pain -depression or memory disorders -pain in your legs or groin -pain at site where injected or implanted -seizures -severe headache -swelling of the feet and legs -suicidal thoughts or other mood changes -visual changes -vomiting Side effects that usually do not require medical attention (report to your doctor or health care professional if they continue or are bothersome): -breast swelling or tenderness -decrease in sex drive or performance -diarrhea -hot flashes -loss of appetite -muscle, joint, or bone pains -nausea -redness or irritation at site where injected or implanted -skin problems or acne This list may not describe all possible side effects. Call your doctor for medical advice about side effects. You may report side effects to FDA at 1-800-FDA-1088. Where should I keep my medicine? This drug is given in a hospital or clinic and will not be stored at home. NOTE: This sheet is a summary. It may not cover all possible information. If you have questions about this medicine, talk to your doctor, pharmacist, or health care provider.  2018 Elsevier/Gold Standard (2015-06-10 09:45:53)  

## 2017-02-14 NOTE — Progress Notes (Signed)
Hematology and Oncology Follow Up Visit  Kyle George [id number] 10-17-1950 67 y.o. 02/14/2017 12:29 PM [place] Chard, MDSanders, Bailey Mech, MD   Principle Diagnosis: 67 year old man with hormone sensitive advanced prostate cancer.  He has metastatic disease with lymphadenopathy.  He was diagnosed with prostate cancer on 09/08/2011, Gleason score 4+5 = 9 and a PSA of 397.   Prior Therapy: Status post androgen deprivation and initially with [place] subsequently with Lupron with a PSA dropping down to 500 and December 2015.  He failed to follow-up with the PSA rising up to 1400 and April 2018.  Current therapy:  Lupron 30 mg injection every 4 months.    Zytiga 1000 mg daily with prednisone at 5 mg daily started in August 2018.  Interim History:  Kyle George is here for a follow-up visit.  Since the last visit, he reports no major changes in his health and continuous improvement in his overall performance status and quality of life.  His appetite remain excellent and does not report any changes in his activity level or energy.  He continues to take Zytiga without any new complications.  He denies any lower extremity edema, excessive fatigue or hyperglycemia.  He has not missed any doses at this time.  He denies any pathological fractures or bone pain.  He denies any recent hospitalizations or illnesses.  He denies any complications related to Lupron.  He does not report any headaches or blurry vision or syncope.  He denied any new neurological deficits.  He does not report any fevers, chills, sweats, weight loss.  He did not report any chest pain, palpitation, orthopnea or PND. He does not report any cough, shortness of breath or hemoptysis. He does not report any nausea, vomiting, constipation, diarrhea or any changes in his bowel habits. He does not report any frequency, urgency hematuria or dysuria.  He does not report any arthralgias or myalgias.  He does not report any skin rashes or lesions.   He does not report any lymphadenopathy or petechiae.  He does not report any heat or cold intolerance.  The rest of his review of systems is negative.   Medications: I have reviewed the patient's current medications.  Current Outpatient Medications  Medication Sig Dispense Refill  . aspirin 81 MG tablet Take 81 mg by mouth daily.    . Calcium Carbonate-Vitamin D (CALCIUM 600+D PO) Take 1 tablet by mouth daily.    . carvedilol (COREG) 6.25 MG tablet Take 6.25 mg by mouth 2 (two) times daily.  3  . cholecalciferol (VITAMIN D) 1000 UNITS tablet Take 5,000 Units by mouth daily.    . Insulin Detemir (LEVEMIR FLEXTOUCH [place]) Inject into the skin.    Marland Kitchen JANUVIA 100 MG tablet Take 100 mg by mouth daily.  3  . Multiple Vitamin (MULTIVITAMIN WITH MINERALS) TABS Take 1 tablet by mouth daily.    . NON FORMULARY Take 1 tablet by mouth daily. Thyroid  action    . pioglitazone-metformin (ACTOPLUS MET) 15-850 MG per tablet Take 1 tablet by mouth Twice daily.    . predniSONE (DELTASONE) 5 MG tablet TAKE 1 TABLET (5 MG TOTAL) BY MOUTH DAILY WITH BREAKFAST. 30 tablet 1  . rosuvastatin (CRESTOR) 10 MG tablet Take 10 mg by mouth daily.    Jabier Gauss 40-10-25 MG TABS Take 1 tablet by mouth Daily.    Marland Kitchen ZYTIGA 250 MG tablet TAKE 4 TABLETS (1,000 MG TOTAL) BY MOUTH DAILY. TAKE ON AN EMPTY STOMACH 1 HOUR BEFORE OR 2  HOURS AFTER A MEAL 120 tablet 0   No current facility-administered medications for this visit.    Facility-Administered Medications Ordered in Other Visits  Medication Dose Route Frequency Provider Last Rate Last Dose  . leuprolide (LUPRON) injection 30 mg  30 mg Intramuscular Once Stella Bortle, Mathis Dad, MD         Allergies: No Known Allergies  Past Medical History, Surgical history, Social history, and Family History were reviewed and updated.   Physical Exam: Blood pressure (!) 168/89, pulse 81, temperature 99.3 F (37.4 C), temperature source Oral, resp. rate 20, height '5\' 9"'  (1.753 m), weight 216 lb  14.4 oz (98.4 kg), SpO2 99 %. ECOG: 0 General appearance: Well-appearing gentleman appeared without distress. Head: Normocephalic, without obvious abnormality  Oropharynx: Mucous membranes are moist and pink.  No oral ulcers or lesions. Eyes: No scleral icterus.  Pupils are equal and round and reactive to light. Lymph nodes: Cervical, supraclavicular, and axillary nodes normal. Heart:regular rate and rhythm, S1, S2 normal, no murmur, click, rub or gallop Lung: Clear to auscultation in all lung fields.  No wheezes or dullness to percussion. Abdomin: Soft, nontender without rebound or guarding in all 4 quadrants. Musculoskeletal: No joint deformity or effusion.  Slight edema noted bilaterally.   Lab Results: Lab Results  Component Value Date   WBC 7.2 02/14/2017   HGB 12.8 (L) 02/14/2017   HCT 38.8 02/14/2017   MCV 97.8 02/14/2017   PLT 162 02/14/2017             Results for HERVE, HAUG (MRN [id number]) as of 02/14/2017 11:55  Ref. Range 09/28/2016 10:45 11/14/2016 15:45  Prostate Specific Ag, Serum Latest Ref Range: 0.0 - 4.0 ng/mL 1.6 0.5     Impression and Plan:   67 year old gentleman with the following issues  1.  Advanced hormone sensitive prostate cancer diagnosed in August 2013. Gleason score of 4+5 = 9.  He received androgen deprivation therapy initially but failed to follow-up and presented again in 2018 with widespread disease and PSA close to 1400.  He is currently receiving Lupron every 4 months in addition to [place].  Zytiga started in August 2018 without any major complications noted.  He denies any side effects or compliance issues.  His PSA showed excellent response currently at 0.5 after presenting with a PSA of close to 1400.  The natural course of this disease was discussed today with the patient as well as options of therapy moving forward.  He is responding very well to the current approach of androgen deprivation therapy and Zytiga.  Complications of  long-term Zytiga was reviewed today which includes hypertension, adrenal insufficiency among others.  She is agreeable to continue at this time.  2. Androgen depravation: Risks and benefits of long-term androgen deprivation was reviewed today he is agreeable to continue.  He will receive a Lupron 30 mg every 4 months indefinitely.  He will receive 1 on 02/14/2017 and repeated in 4 months.  3. Diabetes: No recent exacerbations noted with adding prednisone to Zytiga.  4. Bone health: He is at risk of developing osteoporosis and skeletal related events given his prostate cancer in the bone.  We are awaiting dental clearance to start Xgeva.  5.  Hip pain:Reated to prostate cancer metastasis and has improved dramatically since the start of definitive treatments.  He does not take any pain medication at this time.  6.  Hypertension: His blood pressures been monitored periodically outside of his visits and have been close to  normal range.  We will continue to monitor his blood pressure on Zytiga.  He might require adjustment in his antihypertensive medication.  7.  Prognosis: He has an incurable malignancy but a disease that can be palliated for an extended period of time.  His performance status is excellent and aggressive therapy is warranted.  8. Follow-up: Will be in 2 months.   25 minutes was spent with the patient face-to-face today.  More than 50% of time was dedicated to patient counseling, education and coordination of his multifaceted care.    Zola Button, MD 2/6/201912:29 PM

## 2017-02-14 NOTE — Telephone Encounter (Signed)
Scheduled appt per 2/6 los - Gave patient AVS and calender per los.  

## 2017-02-15 LAB — PROSTATE-SPECIFIC AG, SERUM (LABCORP): Prostate Specific Ag, Serum: <0.1 ng/mL (ref 0.0–4.0)

## 2017-02-20 ENCOUNTER — Other Ambulatory Visit: Payer: Self-pay | Admitting: Pharmacist

## 2017-02-27 ENCOUNTER — Other Ambulatory Visit: Payer: Self-pay | Admitting: Oncology

## 2017-02-27 DIAGNOSIS — C61 Malignant neoplasm of prostate: Secondary | ICD-10-CM

## 2017-03-01 ENCOUNTER — Telehealth: Payer: Self-pay | Admitting: Pharmacy Technician

## 2017-03-01 MED FILL — ZYTIGA 250 MG TABLET: 250 | 30 days supply | Qty: 120 | Fill #0

## 2017-03-01 NOTE — Telephone Encounter (Signed)
Oral Oncology Kyle Advocate Encounter  Was successful in securing Kyle an $ 6500 grant from [place] (PAF) to provide copayment coverage for his Zytiga.  This will keep the out of pocket expense at $0.    I have spoken with the Kyle.    The billing information is as follows and has been shared with [place].   Member ID: [id number] Group ID: [id number] RxBin: [id number] Dates of Eligibility: 09/01/2016 through 03/01/2018  Kyle George. Kyle George, Kyle George Kyle George [phone] 03/01/2017 4:03 PM

## 2017-03-30 ENCOUNTER — Other Ambulatory Visit: Payer: Self-pay | Admitting: Oncology

## 2017-03-30 DIAGNOSIS — C61 Malignant neoplasm of prostate: Secondary | ICD-10-CM

## 2017-04-02 MED FILL — ZYTIGA 250 MG TABLET: 250 | 30 days supply | Qty: 120 | Fill #0

## 2017-04-17 ENCOUNTER — Telehealth: Payer: Self-pay | Admitting: Oncology

## 2017-04-17 ENCOUNTER — Inpatient Hospital Stay: Payer: Medicare Other

## 2017-04-17 ENCOUNTER — Inpatient Hospital Stay: Payer: Medicare Other | Attending: Oncology | Admitting: Oncology

## 2017-04-17 VITALS — BP 152/90 | HR 91 | Temp 97.9°F | Resp 18 | Ht 69.0 in | Wt 217.4 lb

## 2017-04-17 DIAGNOSIS — Z79899 Other long term (current) drug therapy: Secondary | ICD-10-CM | POA: Insufficient documentation

## 2017-04-17 DIAGNOSIS — Z794 Long term (current) use of insulin: Secondary | ICD-10-CM

## 2017-04-17 DIAGNOSIS — C61 Malignant neoplasm of prostate: Secondary | ICD-10-CM | POA: Diagnosis not present

## 2017-04-17 DIAGNOSIS — Z7982 Long term (current) use of aspirin: Secondary | ICD-10-CM | POA: Diagnosis not present

## 2017-04-17 DIAGNOSIS — R599 Enlarged lymph nodes, unspecified: Secondary | ICD-10-CM | POA: Diagnosis not present

## 2017-04-17 DIAGNOSIS — I10 Essential (primary) hypertension: Secondary | ICD-10-CM

## 2017-04-17 DIAGNOSIS — Z191 Hormone sensitive malignancy status: Secondary | ICD-10-CM | POA: Insufficient documentation

## 2017-04-17 DIAGNOSIS — E119 Type 2 diabetes mellitus without complications: Secondary | ICD-10-CM

## 2017-04-17 LAB — CBC WITH DIFFERENTIAL (CANCER CENTER ONLY)
Basophils Absolute: 0 10*3/uL (ref 0.0–0.1)
Basophils Relative: 0 %
Eosinophils Absolute: 0.3 10*3/uL (ref 0.0–0.5)
Eosinophils Relative: 3 %
HCT: 40.9 % (ref 38.4–49.9)
Hemoglobin: 13.5 g/dL (ref 13.0–17.1)
Lymphocytes Relative: 13 %
Lymphs Abs: 1.2 10*3/uL (ref 0.9–3.3)
MCH: 33 pg (ref 27.2–33.4)
MCHC: 33 g/dL (ref 32.0–36.0)
MCV: 100 fL — ABNORMAL HIGH (ref 79.3–98.0)
Monocytes Absolute: 0.5 10*3/uL (ref 0.1–0.9)
Monocytes Relative: 5 %
Neutro Abs: 7.7 10*3/uL — ABNORMAL HIGH (ref 1.5–6.5)
Neutrophils Relative %: 79 %
Platelet Count: 158 10*3/uL (ref 140–400)
RBC: 4.09 MIL/uL — ABNORMAL LOW (ref 4.20–5.82)
RDW: 15.5 % — ABNORMAL HIGH (ref 11.0–14.6)
WBC Count: 9.7 10*3/uL (ref 4.0–10.3)

## 2017-04-17 LAB — CMP (CANCER CENTER ONLY)
ALT: 14 U/L (ref 0–55)
AST: 14 U/L (ref 5–34)
Albumin: 3.3 g/dL — ABNORMAL LOW (ref 3.5–5.0)
Alkaline Phosphatase: 73 U/L (ref 40–150)
Anion gap: 10 (ref 3–11)
BUN: 10 mg/dL (ref 7–26)
CO2: 25 mmol/L (ref 22–29)
Calcium: 9.3 mg/dL (ref 8.4–10.4)
Chloride: 106 mmol/L (ref 98–109)
Creatinine: 0.84 mg/dL (ref 0.70–1.30)
GFR, Est AFR Am: >60 mL/min (ref >60)
GFR, Estimated: >60 mL/min (ref >60)
Glucose, Bld: 185 mg/dL — ABNORMAL HIGH (ref 70–140)
Potassium: 3.4 mmol/L — ABNORMAL LOW (ref 3.5–5.1)
Sodium: 141 mmol/L (ref 136–145)
Total Bilirubin: 0.3 mg/dL (ref 0.2–1.2)
Total Protein: 7.1 g/dL (ref 6.4–8.3)

## 2017-04-17 NOTE — Telephone Encounter (Signed)
Appts scheduled AVS/Calendar printed per 4/9 los

## 2017-04-17 NOTE — Progress Notes (Signed)
Hematology and Oncology Follow Up Visit  Kyle George [id number] 07/19/1950 67 y.o. 04/17/2017 2:40 PM [place] Chard, MDSanders, Bailey Mech, MD   Principle Diagnosis: 67 year old man with advanced prostate cancer with lymphadenopathy. He was diagnosed with Gleason score 4+5 = 9 on 09/08/2011 and  PSA of 397.   Prior Therapy: Status post androgen deprivation and initially with [place] subsequently with Lupron with a PSA dropping down to 500 and December 2015.  He failed to follow-up with the PSA rising up to 1400 and April 2018.  Current therapy:  Lupron 30 mg injection every 4 months.     Zytiga 1000 mg daily with prednisone at 5 mg daily started in August 2018.  Interim History:  Kyle George presents today for a follow-up.  He reports no major changes in his health since the last visit.  He continues to take Zytiga and prednisone without any new complications.  He denies any nausea, vomiting or abdominal pain.  His energy and performance status remains excellent.  He denies any lower extremity swelling or excessive fatigue.  He denies any pathological fractures or bone pain.  He does not report any headaches or blurry vision or syncope.  He does not report any fevers, chills, sweats, weight loss.  He did not report any chest pain, palpitation, orthopnea or PND. He does not report any cough, shortness of breath or hemoptysis. He does not report any nausea, vomiting, constipation, diarrhea or any changes in his bowel habits. He does not report any frequency, urgency hematuria or dysuria.  He does not report any bone pain or pathological fractures.  He does not report any skin rashes or lesions.  He does not report any lymphadenopathy or petechiae.  He does not report any heat or cold intolerance.  The rest of his review of systems is negative.   Medications: I have reviewed the patient's current medications.  Current Outpatient Medications  Medication Sig Dispense Refill  . aspirin 81 MG tablet  Take 81 mg by mouth daily.    . Calcium Carbonate-Vitamin D (CALCIUM 600+D PO) Take 1 tablet by mouth daily.    . carvedilol (COREG) 6.25 MG tablet Take 6.25 mg by mouth 2 (two) times daily.  3  . cholecalciferol (VITAMIN D) 1000 UNITS tablet Take 5,000 Units by mouth daily.    . Insulin Detemir (LEVEMIR FLEXTOUCH Kyle George) Inject into the skin.    Marland Kitchen JANUVIA 100 MG tablet Take 100 mg by mouth daily.  3  . Multiple Vitamin (MULTIVITAMIN WITH MINERALS) TABS Take 1 tablet by mouth daily.    . NON FORMULARY Take 1 tablet by mouth daily. Thyroid  action    . pioglitazone-metformin (ACTOPLUS MET) 15-850 MG per tablet Take 1 tablet by mouth Twice daily.    . predniSONE (DELTASONE) 5 MG tablet TAKE 1 TABLET (5 MG TOTAL) BY MOUTH DAILY WITH BREAKFAST. 30 tablet 1  . rosuvastatin (CRESTOR) 10 MG tablet Take 10 mg by mouth daily.    Jabier Gauss 40-10-25 MG TABS Take 1 tablet by mouth Daily.    Marland Kitchen ZYTIGA 250 MG tablet TAKE 4 TABLETS (1,000 MG TOTAL) BY MOUTH DAILY. TAKE ON AN EMPTY STOMACH 1 HOUR BEFORE OR 2 HOURS AFTER A MEAL 120 tablet 0   No current facility-administered medications for this visit.      Allergies: No Known Allergies  Past Medical History, Surgical history, Social history, and Family History were reviewed and updated.   Physical Exam: Blood pressure (!) 152/90, pulse 91, temperature 97.9  F (36.6 C), temperature source Oral, resp. rate 18, height 5' 9" (1.753 m), weight 217 lb 6.4 oz (98.6 kg), SpO2 98 %.   ECOG: 0 General appearance: Alert, awake gentleman without distress. Head: Atraumatic without abnormalities. Oropharynx: Without oral thrush or ulcers. Eyes: Pupils are equal and round reactive to light. Lymph nodes: No lymphadenopathy noted in the cervical, supraclavicular or axillary lymph nodes. Heart: Regular rate and rhythm without murmurs or gallops. Lung: Clear to auscultation without any rhonchi, wheezes or dullness to percussion. Abdomin: Soft, nontender without any  rebound or guarding. Musculoskeletal: No joint deformity or effusion.   Lab Results: Lab Results  Component Value Date   WBC 7.2 02/14/2017   HGB 12.8 (L) 02/14/2017   HCT 38.8 02/14/2017   MCV 97.8 02/14/2017   PLT 162 02/14/2017            Results for Kyle, George (MRN [id number]) as of 04/17/2017 14:40  Ref. Range 11/14/2016 15:45 02/14/2017 11:40  Prostate Specific Ag, Serum Latest Ref Range: 0.0 - 4.0 ng/mL 0.5 <0.1     Impression and Plan:   67 year old gentleman with the following issues  1. Hormone sensitive metastatic prostate cancer diagnosed in August 2013.  He has disease to the bone as well as lymphadenopathy.   He is currently taking Zytiga with prednisone without any major complications.  Continues to have excellent response to therapy with PSA is undetectable.  He is to continue Zytiga at this time as long as he is responding and he has no issues with that.  He denies any complications related to this medication.  2. Androgen depravation: He will receive Lupron every 4 months and will be due with the next visit.  No long-term complications noted to this medication.  3. Diabetes: Blood sugar remains under control at this time.  4. Bone health: [place] See will be started in the future once he obtains a dental clearance.  Remain on calcium and vitamin D supplements.  5.  Hypertension: Blood pressure slightly elevated today but has been within normal range mostly.  6. Follow-up: Will be in 2 months for a follow-up and Lupron injection.  15 minutes was spent with the patient face-to-face today.  More than 50% of time was dedicated to patient counseling, education and answering questions regarding his diagnosis and future plan of care.    Zola Button, MD 4/9/20192:40 PM

## 2017-04-18 LAB — PROSTATE-SPECIFIC AG, SERUM (LABCORP): Prostate Specific Ag, Serum: <0.1 ng/mL (ref 0.0–4.0)

## 2017-04-24 ENCOUNTER — Other Ambulatory Visit: Payer: Self-pay | Admitting: Oncology

## 2017-04-24 DIAGNOSIS — C61 Malignant neoplasm of prostate: Secondary | ICD-10-CM

## 2017-05-01 ENCOUNTER — Other Ambulatory Visit: Payer: Self-pay | Admitting: Oncology

## 2017-05-01 DIAGNOSIS — C61 Malignant neoplasm of prostate: Secondary | ICD-10-CM

## 2017-05-01 MED FILL — ZYTIGA 250 MG TABLET: 250 | 30 days supply | Qty: 120 | Fill #0

## 2017-05-07 ENCOUNTER — Other Ambulatory Visit: Payer: Self-pay | Admitting: Pharmacist

## 2017-05-22 ENCOUNTER — Other Ambulatory Visit: Payer: Self-pay | Admitting: Oncology

## 2017-05-22 DIAGNOSIS — C61 Malignant neoplasm of prostate: Secondary | ICD-10-CM

## 2017-05-25 MED FILL — ZYTIGA 250 MG TABLET: 250 | 30 days supply | Qty: 120 | Fill #0

## 2017-06-13 ENCOUNTER — Telehealth: Payer: Self-pay

## 2017-06-13 ENCOUNTER — Inpatient Hospital Stay: Payer: Medicare Other | Attending: Oncology

## 2017-06-13 ENCOUNTER — Inpatient Hospital Stay: Payer: Medicare Other | Admitting: Oncology

## 2017-06-13 ENCOUNTER — Inpatient Hospital Stay: Payer: Medicare Other

## 2017-06-13 VITALS — BP 176/90 | HR 73 | Temp 98.4°F | Resp 17 | Ht 69.0 in | Wt 217.3 lb

## 2017-06-13 DIAGNOSIS — Z7982 Long term (current) use of aspirin: Secondary | ICD-10-CM | POA: Diagnosis not present

## 2017-06-13 DIAGNOSIS — E119 Type 2 diabetes mellitus without complications: Secondary | ICD-10-CM | POA: Insufficient documentation

## 2017-06-13 DIAGNOSIS — C61 Malignant neoplasm of prostate: Secondary | ICD-10-CM | POA: Diagnosis not present

## 2017-06-13 DIAGNOSIS — Z794 Long term (current) use of insulin: Secondary | ICD-10-CM | POA: Diagnosis not present

## 2017-06-13 DIAGNOSIS — I10 Essential (primary) hypertension: Secondary | ICD-10-CM | POA: Diagnosis not present

## 2017-06-13 DIAGNOSIS — Z192 Hormone resistant malignancy status: Secondary | ICD-10-CM | POA: Insufficient documentation

## 2017-06-13 DIAGNOSIS — Z79899 Other long term (current) drug therapy: Secondary | ICD-10-CM | POA: Diagnosis not present

## 2017-06-13 DIAGNOSIS — Z79818 Long term (current) use of other agents affecting estrogen receptors and estrogen levels: Secondary | ICD-10-CM

## 2017-06-13 LAB — CMP (CANCER CENTER ONLY)
ALT: 10 U/L (ref 0–55)
AST: 13 U/L (ref 5–34)
Albumin: 3.5 g/dL (ref 3.5–5.0)
Alkaline Phosphatase: 77 U/L (ref 40–150)
Anion gap: 9 (ref 3–11)
BUN: 11 mg/dL (ref 7–26)
CO2: 28 mmol/L (ref 22–29)
Calcium: 9.1 mg/dL (ref 8.4–10.4)
Chloride: 106 mmol/L (ref 98–109)
Creatinine: 0.78 mg/dL (ref 0.70–1.30)
GFR, Est AFR Am: >60 mL/min (ref >60)
GFR, Estimated: >60 mL/min (ref >60)
Glucose, Bld: 131 mg/dL (ref 70–140)
Potassium: 3.6 mmol/L (ref 3.5–5.1)
Sodium: 143 mmol/L (ref 136–145)
Total Bilirubin: 0.2 mg/dL (ref 0.2–1.2)
Total Protein: 7.3 g/dL (ref 6.4–8.3)

## 2017-06-13 LAB — CBC WITH DIFFERENTIAL (CANCER CENTER ONLY)
Basophils Absolute: 0.1 10*3/uL (ref 0.0–0.1)
Basophils Relative: 1 %
Eosinophils Absolute: 0.4 10*3/uL (ref 0.0–0.5)
Eosinophils Relative: 5 %
HCT: 40.4 % (ref 38.4–49.9)
Hemoglobin: 13.5 g/dL (ref 13.0–17.1)
Lymphocytes Relative: 19 %
Lymphs Abs: 1.5 10*3/uL (ref 0.9–3.3)
MCH: 33.1 pg (ref 27.2–33.4)
MCHC: 33.5 g/dL (ref 32.0–36.0)
MCV: 98.8 fL — ABNORMAL HIGH (ref 79.3–98.0)
Monocytes Absolute: 0.5 10*3/uL (ref 0.1–0.9)
Monocytes Relative: 6 %
Neutro Abs: 5.3 10*3/uL (ref 1.5–6.5)
Neutrophils Relative %: 69 %
Platelet Count: 139 10*3/uL — ABNORMAL LOW (ref 140–400)
RBC: 4.09 MIL/uL — ABNORMAL LOW (ref 4.20–5.82)
RDW: 14.8 % — ABNORMAL HIGH (ref 11.0–14.6)
WBC Count: 7.7 10*3/uL (ref 4.0–10.3)

## 2017-06-13 MED ORDER — LEUPROLIDE ACETATE (4 MONTH) 30 MG IM KIT
30.0000 mg | PACK | Freq: Once | INTRAMUSCULAR | Status: AC
  Administered 2017-06-13: 30 mg via INTRAMUSCULAR
  Filled 2017-06-13: qty 30

## 2017-06-13 NOTE — Patient Instructions (Signed)
Leuprolide depot injection What is this medicine? LEUPROLIDE (loo PROE lide) is a man-made protein that acts like a natural hormone in the body. It decreases testosterone in men and decreases estrogen in women. In men, this medicine is used to treat advanced prostate cancer. In women, some forms of this medicine may be used to treat endometriosis, uterine fibroids, or other male hormone-related problems. This medicine may be used for other purposes; ask your health care provider or pharmacist if you have questions. COMMON BRAND NAME(S): Eligard, Lupron Depot, Lupron Depot-Ped, Viadur What should I tell my health care provider before I take this medicine? They need to know if you have any of these conditions: -diabetes -heart disease or previous heart attack -high blood pressure -high cholesterol -mental illness -osteoporosis -pain or difficulty passing urine -seizures -spinal cord metastasis -stroke -suicidal thoughts, plans, or attempt; a previous suicide attempt by you or a family member -tobacco smoker -unusual vaginal bleeding (women) -an unusual or allergic reaction to leuprolide, benzyl alcohol, other medicines, foods, dyes, or preservatives -pregnant or trying to get pregnant -breast-feeding How should I use this medicine? This medicine is for injection into a muscle or for injection under the skin. It is given by a health care professional in a hospital or clinic setting. The specific product will determine how it will be given to you. Make sure you understand which product you receive and how often you will receive it. Talk to your pediatrician regarding the use of this medicine in children. Special care may be needed. Overdosage: If you think you have taken too much of this medicine contact a poison control center or emergency room at once. NOTE: This medicine is only for you. Do not share this medicine with others. What if I miss a dose? It is important not to miss a dose.  Call your doctor or health care professional if you are unable to keep an appointment. Depot injections: Depot injections are given either once-monthly, every 12 weeks, every 16 weeks, or every 24 weeks depending on the product you are prescribed. The product you are prescribed will be based on if you are male or male, and your condition. Make sure you understand your product and dosing. What may interact with this medicine? Do not take this medicine with any of the following medications: -chasteberry This medicine may also interact with the following medications: -herbal or dietary supplements, like black cohosh or DHEA -male hormones, like estrogens or progestins and birth control pills, patches, rings, or injections -male hormones, like testosterone This list may not describe all possible interactions. Give your health care provider a list of all the medicines, herbs, non-prescription drugs, or dietary supplements you use. Also tell them if you smoke, drink alcohol, or use illegal drugs. Some items may interact with your medicine. What should I watch for while using this medicine? Visit your doctor or health care professional for regular checks on your progress. During the first weeks of treatment, your symptoms may get worse, but then will improve as you continue your treatment. You may get hot flashes, increased bone pain, increased difficulty passing urine, or an aggravation of nerve symptoms. Discuss these effects with your doctor or health care professional, some of them may improve with continued use of this medicine. Male patients may experience a menstrual cycle or spotting during the first months of therapy with this medicine. If this continues, contact your doctor or health care professional. What side effects may I notice from receiving this medicine? Side   effects that you should report to your doctor or health care professional as soon as possible: -allergic reactions like skin  rash, itching or hives, swelling of the face, lips, or tongue -breathing problems -chest pain -depression or memory disorders -pain in your legs or groin -pain at site where injected or implanted -seizures -severe headache -swelling of the feet and legs -suicidal thoughts or other mood changes -visual changes -vomiting Side effects that usually do not require medical attention (report to your doctor or health care professional if they continue or are bothersome): -breast swelling or tenderness -decrease in sex drive or performance -diarrhea -hot flashes -loss of appetite -muscle, joint, or bone pains -nausea -redness or irritation at site where injected or implanted -skin problems or acne This list may not describe all possible side effects. Call your doctor for medical advice about side effects. You may report side effects to FDA at 1-800-FDA-1088. Where should I keep my medicine? This drug is given in a hospital or clinic and will not be stored at home. NOTE: This sheet is a summary. It may not cover all possible information. If you have questions about this medicine, talk to your doctor, pharmacist, or health care provider.  2018 Elsevier/Gold Standard (2015-06-10 09:45:53)  

## 2017-06-13 NOTE — Progress Notes (Signed)
Hematology and Oncology Follow Up Visit  Kyle George [id number] [birth year] 67 y.o. 06/13/2017 10:39 AM [place] Chard, MDSanders, Bailey Mech, MD   Principle Diagnosis: 67 year old man with castration-sensitive advanced prostate cancer diagnosed in 2013.  He was found to have lymphadenopathy, Gleason score 4+5 = 9  and PSA of 397.   Prior Therapy: Status post androgen deprivation and initially with [place] subsequently with Lupron with a PSA dropping down to 500 and December 2015.  He failed to follow-up with the PSA rising up to 1400 and April 2018.  Current therapy:  Lupron 30 mg injection every 4 months.     Zytiga 1000 mg daily with prednisone at 5 mg daily started in August 2018.  Interim History:  Kyle George is here for a follow-up.  Since the last visit, he reports no major changes in his health.  He continues to take Zytiga with prednisone without any major complaints.  He denies any leg edema or excessive fatigue.  He denies any pathological fractures or bone pain.  His appetite is excellent and continues to work full-time.  He does not report any urination difficulties or hematuria.  He denies any complications related to Lupron: Hot flashes.   He does not report any headaches or blurry vision or syncope.  He denies any worsening neuropathy or new neurological deficits.  He does not report any fevers, chills, sweats, weight loss.  He did not report any chest pain, palpitation, orthopnea or PND. He does not report any cough, shortness of breath or hemoptysis. He does not report any nausea, vomiting, constipation, diarrhea. He does not report any frequency, urgency hematuria or dysuria.  He does not report any bone pain or pathological fractures.  He does not report any skin rashes or lesions.  He does not report any lymphadenopathy or petechiae.  He does not report any anxiety or depression.  The rest of his review of systems is negative.   Medications: I have reviewed the patient's  current medications.  Current Outpatient Medications  Medication Sig Dispense Refill  . aspirin 81 MG tablet Take 81 mg by mouth daily.    . Calcium Carbonate-Vitamin D (CALCIUM 600+D PO) Take 1 tablet by mouth daily.    . carvedilol (COREG) 6.25 MG tablet Take 6.25 mg by mouth 2 (two) times daily.  3  . cholecalciferol (VITAMIN D) 1000 UNITS tablet Take 5,000 Units by mouth daily.    . Insulin Detemir (LEVEMIR FLEXTOUCH [place]) Inject into the skin.    Marland Kitchen JANUVIA 100 MG tablet Take 100 mg by mouth daily.  3  . Multiple Vitamin (MULTIVITAMIN WITH MINERALS) TABS Take 1 tablet by mouth daily.    . NON FORMULARY Take 1 tablet by mouth daily. Thyroid  action    . pioglitazone-metformin (ACTOPLUS MET) 15-850 MG per tablet Take 1 tablet by mouth Twice daily.    . predniSONE (DELTASONE) 5 MG tablet TAKE 1 TABLET (5 MG TOTAL) BY MOUTH DAILY WITH BREAKFAST. 30 tablet 1  . rosuvastatin (CRESTOR) 10 MG tablet Take 10 mg by mouth daily.    Jabier Gauss 40-10-25 MG TABS Take 1 tablet by mouth Daily.    Marland Kitchen ZYTIGA 250 MG tablet TAKE 4 TABLETS (1,000 MG TOTAL) BY MOUTH DAILY. TAKE ON AN EMPTY STOMACH 1 HOUR BEFORE OR 2 HOURS AFTER A MEAL 120 tablet 0   No current facility-administered medications for this visit.      Allergies: No Known Allergies  Past Medical History, Surgical history, Social history, and  Family History were reviewed and updated.   Physical Exam: Blood pressure (!) 176/90, pulse 73, temperature 98.4 F (36.9 C), temperature source Oral, resp. rate 17, height '5\' 9"'  (1.753 m), weight 217 lb 4.8 oz (98.6 kg), SpO2 100 %.   ECOG: 0 General appearance: Well-appearing gentleman without distress. Head: Normocephalic without any trauma. Oropharynx: Mucous membranes are moist and pink. Eyes: Sclera anicteric. Lymph nodes: No cervical, supraclavicular, inguinal or axillary lymph nodes. Heart: Regular rate without any murmurs or gallops.  Lung: Clear without any rhonchi, wheezes or dullness to  percussion. Abdomin: Soft, nontender without any rebound or guarding. Musculoskeletal: No clubbing or cyanosis.   Lab Results: Lab Results  Component Value Date   WBC 7.7 06/13/2017   HGB 13.5 06/13/2017   HCT 40.4 06/13/2017   MCV 98.8 (H) 06/13/2017   PLT 139 (L) 06/13/2017            Results for BRENDT, DIBLE (MRN [id number]) as of 06/13/2017 10:39  Ref. Range 02/14/2017 11:40 04/17/2017 14:29  Prostate Specific Ag, Serum Latest Ref Range: 0.0 - 4.0 ng/mL <0.1 <0.1      Impression and Plan:   67 year old man with:  1.  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  He has documented disease with lymphadenopathy and bone involvement.  He remains on Zytiga and prednisone with excellent response to his PSA is currently undetectable.  He denies any major complications related to medication.  Risks and benefits of continuing Zytiga and prednisone were discussed is agreeable to continue.  2. Androgen depravation: He will receive Lupron today and repeated every 4 months.  Complication associated with this medication were reviewed today which include osteoporosis, hot flashes as well as fatigue.  Reports no issues at this time.  3. Diabetes: He is followed by his primary care physician although he has not seen them in a while.  I urged him to reestablish care in the immediate future to monitor his blood sugar.  4. Bone health: He is currently on calcium and vitamin D supplements.  Will consider bone directed therapy after obtaining dental clearance.  5.  Hypertension: His blood pressure remains elevated today.  We have discussed the importance of him establishing care with his primary care physician and monitor his blood pressure in ambulatory fashion.  If his blood pressure persistently high he is on antihypertensive medication need to be adjusted.  He will make appointment with his primary care immediately.  6. Follow-up: Will be in 4 months.   15  minutes was spent with the patient face-to-face today.  More than 50% of time was dedicated to patient counseling, education and urinating his future plan of care.    Kyle Button, MD 6/5/201910:39 AM

## 2017-06-13 NOTE — Telephone Encounter (Signed)
Printed avs and calender of [place] appointment. Per 6/5 los

## 2017-06-14 DIAGNOSIS — I129 Hypertensive chronic kidney disease with stage 1 through stage 4 chronic kidney disease, or unspecified chronic kidney disease: Secondary | ICD-10-CM | POA: Diagnosis not present

## 2017-06-14 DIAGNOSIS — E1122 Type 2 diabetes mellitus with diabetic chronic kidney disease: Secondary | ICD-10-CM | POA: Diagnosis not present

## 2017-06-14 DIAGNOSIS — I130 Hypertensive heart and chronic kidney disease with heart failure and stage 1 through stage 4 chronic kidney disease, or unspecified chronic kidney disease: Secondary | ICD-10-CM | POA: Diagnosis not present

## 2017-06-14 LAB — PROSTATE-SPECIFIC AG, SERUM (LABCORP): Prostate Specific Ag, Serum: <0.1 ng/mL (ref 0.0–4.0)

## 2017-07-04 ENCOUNTER — Other Ambulatory Visit: Payer: Self-pay | Admitting: Oncology

## 2017-07-04 DIAGNOSIS — C61 Malignant neoplasm of prostate: Secondary | ICD-10-CM

## 2017-07-04 MED FILL — ZYTIGA 250 MG TABLET: 250 | 30 days supply | Qty: 120 | Fill #0

## 2017-07-13 ENCOUNTER — Telehealth: Payer: Self-pay | Admitting: Pharmacist

## 2017-07-13 NOTE — Telephone Encounter (Signed)
Oral Oncology Pharmacist Encounter  Received notification from patient access network (PAN) foundation that there had not been active any activity Patient copayment grant account in the past 90 days. Claim must be processed on patient's account by 07/31/2017 or the grant will be canceled. Reviewed refill and billing history with the [facility] outpatient pharmacy. Patient's insurance is paying 100% of the cost of patient's medicine at this time.  No need for PA NF grant at this time. No other claims need to be billed against it, so the grant will cancel. Zytiga does remain affordable to patient at this time.  Johny Drilling, PharmD, BCPS, BCOP  07/13/2017 3:04 PM Oral Oncology Clinic [phone]

## 2017-07-25 ENCOUNTER — Other Ambulatory Visit: Payer: Self-pay | Admitting: Oncology

## 2017-07-25 DIAGNOSIS — C61 Malignant neoplasm of prostate: Secondary | ICD-10-CM

## 2017-07-30 MED FILL — ZYTIGA 250 MG TABLET: 250 | 30 days supply | Qty: 120 | Fill #0

## 2017-08-21 ENCOUNTER — Other Ambulatory Visit: Payer: Self-pay | Admitting: Oncology

## 2017-08-21 DIAGNOSIS — C61 Malignant neoplasm of prostate: Secondary | ICD-10-CM

## 2017-08-29 MED FILL — ZYTIGA 250 MG TABLET: 250 | 30 days supply | Qty: 120 | Fill #0

## 2017-09-03 ENCOUNTER — Other Ambulatory Visit: Payer: Self-pay | Admitting: Oncology

## 2017-09-03 DIAGNOSIS — C61 Malignant neoplasm of prostate: Secondary | ICD-10-CM

## 2017-09-18 ENCOUNTER — Telehealth: Payer: Self-pay | Admitting: Pharmacist

## 2017-09-18 NOTE — Telephone Encounter (Signed)
Oral Chemotherapy Pharmacist Encounter  Follow-Up Form  Spoke with patient today to follow up regarding patient's oral chemotherapy medication: Zytiga (abiraterone) for the treatment of metastatic, castration-sensitive prostate cancer in conjunction with prednisone, planned duration until disease progression or unacceptable toxicity.  Original Start date of oral chemotherapy: 08/05/16  Pt is doing well today  Pt reports 0 tablets/doses of Zytiga 250mg , 4 tablets (1000mg ) by mouth once daily, taken on an empty stomach, 1 hour before or 2 hours after meals, missed in the last month.   Pt reports the following side effects: patient reports no side effects to report  Pertinent labs reviewed: OK for continued treatment.  Office visit on 10/10/17 confirmed with patient.  Patient knows to call the office with questions or concerns. Oral Oncology Clinic will continue to follow.  Johny Drilling, PharmD, BCPS, BCOP  09/18/2017 3:15 PM Oral Oncology Clinic [phone]

## 2017-09-24 ENCOUNTER — Other Ambulatory Visit: Payer: Self-pay | Admitting: Oncology

## 2017-09-24 DIAGNOSIS — C61 Malignant neoplasm of prostate: Secondary | ICD-10-CM

## 2017-10-03 MED FILL — ZYTIGA 250 MG TABLET: 250 | 30 days supply | Qty: 120 | Fill #0

## 2017-10-10 ENCOUNTER — Inpatient Hospital Stay: Payer: Medicare Other

## 2017-10-10 ENCOUNTER — Inpatient Hospital Stay: Payer: Medicare Other | Attending: Oncology | Admitting: Oncology

## 2017-10-10 ENCOUNTER — Telehealth: Payer: Self-pay

## 2017-10-10 VITALS — BP 170/90 | HR 71 | Temp 98.4°F | Resp 17 | Ht 69.0 in | Wt 218.2 lb

## 2017-10-10 DIAGNOSIS — Z7982 Long term (current) use of aspirin: Secondary | ICD-10-CM | POA: Insufficient documentation

## 2017-10-10 DIAGNOSIS — Z794 Long term (current) use of insulin: Secondary | ICD-10-CM | POA: Diagnosis not present

## 2017-10-10 DIAGNOSIS — C61 Malignant neoplasm of prostate: Secondary | ICD-10-CM

## 2017-10-10 DIAGNOSIS — Z79818 Long term (current) use of other agents affecting estrogen receptors and estrogen levels: Secondary | ICD-10-CM | POA: Insufficient documentation

## 2017-10-10 DIAGNOSIS — I10 Essential (primary) hypertension: Secondary | ICD-10-CM | POA: Insufficient documentation

## 2017-10-10 DIAGNOSIS — Z79899 Other long term (current) drug therapy: Secondary | ICD-10-CM | POA: Diagnosis not present

## 2017-10-10 DIAGNOSIS — Z191 Hormone sensitive malignancy status: Secondary | ICD-10-CM | POA: Diagnosis not present

## 2017-10-10 DIAGNOSIS — E119 Type 2 diabetes mellitus without complications: Secondary | ICD-10-CM | POA: Insufficient documentation

## 2017-10-10 DIAGNOSIS — R599 Enlarged lymph nodes, unspecified: Secondary | ICD-10-CM | POA: Diagnosis not present

## 2017-10-10 LAB — CMP (CANCER CENTER ONLY)
ALT: 10 U/L (ref 0–44)
AST: 13 U/L — ABNORMAL LOW (ref 15–41)
Albumin: 3.4 g/dL — ABNORMAL LOW (ref 3.5–5.0)
Alkaline Phosphatase: 68 U/L (ref 38–126)
Anion gap: 10 (ref 5–15)
BUN: 11 mg/dL (ref 8–23)
CO2: 24 mmol/L (ref 22–32)
Calcium: 9 mg/dL (ref 8.9–10.3)
Chloride: 107 mmol/L (ref 98–111)
Creatinine: 0.77 mg/dL (ref 0.61–1.24)
GFR, Est AFR Am: >60 mL/min (ref >60)
GFR, Estimated: >60 mL/min (ref >60)
Glucose, Bld: 122 mg/dL — ABNORMAL HIGH (ref 70–99)
Potassium: 4.1 mmol/L (ref 3.5–5.1)
Sodium: 141 mmol/L (ref 135–145)
Total Bilirubin: 0.3 mg/dL (ref 0.3–1.2)
Total Protein: 7.1 g/dL (ref 6.5–8.1)

## 2017-10-10 LAB — CBC WITH DIFFERENTIAL (CANCER CENTER ONLY)
Basophils Absolute: 0 10*3/uL (ref 0.0–0.1)
Basophils Relative: 0 %
Eosinophils Absolute: 0.2 10*3/uL (ref 0.0–0.5)
Eosinophils Relative: 2 %
HCT: 38.9 % (ref 38.4–49.9)
Hemoglobin: 12.7 g/dL — ABNORMAL LOW (ref 13.0–17.1)
Lymphocytes Relative: 14 %
Lymphs Abs: 1.2 10*3/uL (ref 0.9–3.3)
MCH: 33.1 pg (ref 27.2–33.4)
MCHC: 32.6 g/dL (ref 32.0–36.0)
MCV: 101.3 fL — ABNORMAL HIGH (ref 79.3–98.0)
Monocytes Absolute: 0.3 10*3/uL (ref 0.1–0.9)
Monocytes Relative: 3 %
Neutro Abs: 7.1 10*3/uL — ABNORMAL HIGH (ref 1.5–6.5)
Neutrophils Relative %: 81 %
Platelet Count: 153 10*3/uL (ref 140–400)
RBC: 3.84 MIL/uL — ABNORMAL LOW (ref 4.20–5.82)
RDW: 14.7 % — ABNORMAL HIGH (ref 11.0–14.6)
WBC Count: 8.8 10*3/uL (ref 4.0–10.3)

## 2017-10-10 MED ORDER — LEUPROLIDE ACETATE (4 MONTH) 30 MG IM KIT
30.0000 mg | PACK | Freq: Once | INTRAMUSCULAR | Status: AC
  Administered 2017-10-10: 30 mg via INTRAMUSCULAR
  Filled 2017-10-10: qty 30

## 2017-10-10 NOTE — Patient Instructions (Signed)
Leuprolide depot injection What is this medicine? LEUPROLIDE (loo PROE lide) is a man-made protein that acts like a natural hormone in the body. It decreases testosterone in men and decreases estrogen in women. In men, this medicine is used to treat advanced prostate cancer. In women, some forms of this medicine may be used to treat endometriosis, uterine fibroids, or other male hormone-related problems. This medicine may be used for other purposes; ask your health care provider or pharmacist if you have questions. COMMON BRAND NAME(S): Eligard, Lupron Depot, Lupron Depot-Ped, Viadur What should I tell my health care provider before I take this medicine? They need to know if you have any of these conditions: -diabetes -heart disease or previous heart attack -high blood pressure -high cholesterol -mental illness -osteoporosis -pain or difficulty passing urine -seizures -spinal cord metastasis -stroke -suicidal thoughts, plans, or attempt; a previous suicide attempt by you or a family member -tobacco smoker -unusual vaginal bleeding (women) -an unusual or allergic reaction to leuprolide, benzyl alcohol, other medicines, foods, dyes, or preservatives -pregnant or trying to get pregnant -breast-feeding How should I use this medicine? This medicine is for injection into a muscle or for injection under the skin. It is given by a health care professional in a hospital or clinic setting. The specific product will determine how it will be given to you. Make sure you understand which product you receive and how often you will receive it. Talk to your pediatrician regarding the use of this medicine in children. Special care may be needed. Overdosage: If you think you have taken too much of this medicine contact a poison control center or emergency room at once. NOTE: This medicine is only for you. Do not share this medicine with others. What if I miss a dose? It is important not to miss a dose.  Call your doctor or health care professional if you are unable to keep an appointment. Depot injections: Depot injections are given either once-monthly, every 12 weeks, every 16 weeks, or every 24 weeks depending on the product you are prescribed. The product you are prescribed will be based on if you are male or male, and your condition. Make sure you understand your product and dosing. What may interact with this medicine? Do not take this medicine with any of the following medications: -chasteberry This medicine may also interact with the following medications: -herbal or dietary supplements, like black cohosh or DHEA -male hormones, like estrogens or progestins and birth control pills, patches, rings, or injections -male hormones, like testosterone This list may not describe all possible interactions. Give your health care provider a list of all the medicines, herbs, non-prescription drugs, or dietary supplements you use. Also tell them if you smoke, drink alcohol, or use illegal drugs. Some items may interact with your medicine. What should I watch for while using this medicine? Visit your doctor or health care professional for regular checks on your progress. During the first weeks of treatment, your symptoms may get worse, but then will improve as you continue your treatment. You may get hot flashes, increased bone pain, increased difficulty passing urine, or an aggravation of nerve symptoms. Discuss these effects with your doctor or health care professional, some of them may improve with continued use of this medicine. Male patients may experience a menstrual cycle or spotting during the first months of therapy with this medicine. If this continues, contact your doctor or health care professional. What side effects may I notice from receiving this medicine? Side   effects that you should report to your doctor or health care professional as soon as possible: -allergic reactions like skin  rash, itching or hives, swelling of the face, lips, or tongue -breathing problems -chest pain -depression or memory disorders -pain in your legs or groin -pain at site where injected or implanted -seizures -severe headache -swelling of the feet and legs -suicidal thoughts or other mood changes -visual changes -vomiting Side effects that usually do not require medical attention (report to your doctor or health care professional if they continue or are bothersome): -breast swelling or tenderness -decrease in sex drive or performance -diarrhea -hot flashes -loss of appetite -muscle, joint, or bone pains -nausea -redness or irritation at site where injected or implanted -skin problems or acne This list may not describe all possible side effects. Call your doctor for medical advice about side effects. You may report side effects to FDA at 1-800-FDA-1088. Where should I keep my medicine? This drug is given in a hospital or clinic and will not be stored at home. NOTE: This sheet is a summary. It may not cover all possible information. If you have questions about this medicine, talk to your doctor, pharmacist, or health care provider.  2018 Elsevier/Gold Standard (2015-06-10 09:45:53)  

## 2017-10-10 NOTE — Telephone Encounter (Signed)
Printed avs and calender of upcoming appointment. Per 10/2 los 

## 2017-10-10 NOTE — Progress Notes (Signed)
Hematology and Oncology Follow Up Visit  Kyle George [id number] 11/10/1950 67 y.o. 10/10/2017 1:24 PM George, Kyle, MDSanders, Robyn, MD   Principle Diagnosis: 67-year-old man with castration-sensitive prostate cancer with lymphadenopathy diagnosed in 2013.  Presented with Gleason score 4+5 = 9  and PSA of 397.   Prior Therapy: Status post androgen deprivation and initially with [place] subsequently with Lupron with a PSA dropping down to 500 and December 2015.  He failed to follow-up with the PSA rising up to 1400 and April 2018.  Current therapy:  Lupron 30 mg injection every 4 months.     Zytiga 1000 mg daily with prednisone at 5 mg daily started in August 2018.  Interim History:  [name] returns today for a follow-up.  Since last visit, he was seen by his primary care physician and was started on antihypertensive medication and has tolerated it well.  He has not been checking his blood pressure at home but is planning to.  He continues to take Zytiga without any major complications.  He denies any nausea, excessive fatigue or changes in bowel habits.  He denies any lower extremity edema.  His performance status and activity level remain excellent.   He does not report any headaches or blurry vision or syncope.  He denies any dizziness or alteration of mentation.  He does not report any fevers, chills, sweats, weight loss.  He did not report any chest pain, palpitation, orthopnea or PND. He does not report any cough, shortness of breath or hemoptysis. He does not report any nausea, vomiting.  He denies any change in his bowel habits.  He does not report any frequency, urgency hematuria or dysuria.  He does not report any arthralgias or myalgias.  He does not report any skin rashes or lesions.  He does not report any lymphadenopathy or petechiae.  He does not report any mood changes..  The rest of his review of systems is negative.   Medications: I have reviewed the patient's current  medications.  Current Outpatient Medications  Medication Sig Dispense Refill  . aspirin 81 MG tablet Take 81 mg by mouth daily.    . Calcium Carbonate-Vitamin D (CALCIUM 600+D PO) Take 1 tablet by mouth daily.    . carvedilol (COREG) 6.25 MG tablet Take 6.25 mg by mouth 2 (two) times daily.  3  . cholecalciferol (VITAMIN D) 1000 UNITS tablet Take 5,000 Units by mouth daily.    . Insulin Detemir (LEVEMIR FLEXTOUCH [place]) Inject into the skin.    . JANUVIA 100 MG tablet Take 100 mg by mouth daily.  3  . Multiple Vitamin (MULTIVITAMIN WITH MINERALS) TABS Take 1 tablet by mouth daily.    . NON FORMULARY Take 1 tablet by mouth daily. Thyroid  action    . pioglitazone-metformin (ACTOPLUS MET) 15-850 MG per tablet Take 1 tablet by mouth Twice daily.    . predniSONE (DELTASONE) 5 MG tablet TAKE 1 TABLET (5 MG TOTAL) BY MOUTH DAILY WITH BREAKFAST. 30 tablet 1  . rosuvastatin (CRESTOR) 10 MG tablet Take 10 mg by mouth daily.    . TRIBENZOR 40-10-25 MG TABS Take 1 tablet by mouth Daily.    . ZYTIGA 250 MG tablet TAKE 4 TABLETS (1,000 MG TOTAL) BY MOUTH DAILY. TAKE ON AN EMPTY STOMACH 1 HOUR BEFORE OR 2 HOURS AFTER A MEAL 120 tablet 0   No current facility-administered medications for this visit.      Allergies: No Known Allergies  Past Medical History, Surgical   history, Social history, and Family History were reviewed and updated.   Physical Exam: Blood pressure (!) 170/90, pulse 71, temperature 98.4 F (36.9 C), temperature source Oral, resp. rate 17, height 5' 9" (1.753 m), weight 218 lb 3.2 oz (99 kg), SpO2 100 %.   ECOG: 0   General appearance: Comfortable appearing without any discomfort Head: Normocephalic without any trauma Oropharynx: Mucous membranes are moist and pink without any thrush or ulcers. Eyes: Pupils are equal and round reactive to light. Lymph nodes: No cervical, supraclavicular, inguinal or axillary lymphadenopathy.   Heart:regular rate and rhythm.  S1 and S2 without  leg edema. Lung: Clear without any rhonchi or wheezes.  No dullness to percussion. Abdomin: Soft, nontender, nondistended with good bowel sounds.  No hepatosplenomegaly. Musculoskeletal: No joint deformity or effusion.  Full range of motion noted. Neurological: No deficits noted on motor, sensory and deep tendon reflex exam. Skin: No petechial rash or dryness.  Appeared moist.     Lab Results: Lab Results  Component Value Date   WBC 8.8 10/10/2017   HGB 12.7 (L) 10/10/2017   HCT 38.9 10/10/2017   MCV 101.3 (H) 10/10/2017   PLT 153 10/10/2017             Results for [name], [name] (MRN [id number]) as of 10/10/2017 13:17  Ref. Range 04/17/2017 14:29 06/13/2017 10:14  Prostate Specific Ag, Serum Latest Ref Range: 0.0 - 4.0 ng/mL <0.1 <0.1      Impression and Plan:   68 year old man with:  1.  Castration-sensitive prostate cancer with adenopathy diagnosed in August 2013.    He is currently on Zytiga and has tolerated therapy very well.  He denies any difficulties obtaining this medication with his PSA showing excellent response.  Risks and benefits of continuing this therapy long-term was reviewed today.  Given his excellent response and tolerance of recommended continuing this therapy indefinitely.  2. Androgen depravation: He is currently on Lupron and has tolerated it very well.  The plan is to continue it every 4 months.  He will receive one today and repeat in February 2020.  3. Diabetes: Followed by his primary care physician.  No exacerbation noted at this time.  4. Bone health: I encouraged him to continue with calcium and vitamin D.  Upon obtaining dental clearance will consider Xgeva at the time.  His bone disease has been rather limited.  5.  Hypertension: He has been started on antihypertensives and followed by his primary care physician.  I urged him to continue to monitor his blood pressure at home.  6. Follow-up: Will be in 4 months.   15 minutes was spent with  the patient face-to-face today.  More than 50% of time was dedicated to reviewing the natural course of his disease, treatment options and managing complications related to therapy.    Zola Button, MD 10/2/20191:24 PM

## 2017-10-11 LAB — PROSTATE-SPECIFIC AG, SERUM (LABCORP): Prostate Specific Ag, Serum: 0.4 ng/mL (ref 0.0–4.0)

## 2017-10-25 ENCOUNTER — Other Ambulatory Visit: Payer: Self-pay | Admitting: Oncology

## 2017-10-25 DIAGNOSIS — C61 Malignant neoplasm of prostate: Secondary | ICD-10-CM

## 2017-10-26 ENCOUNTER — Other Ambulatory Visit: Payer: Self-pay | Admitting: Nurse Practitioner

## 2017-10-26 DIAGNOSIS — E782 Mixed hyperlipidemia: Secondary | ICD-10-CM

## 2017-10-26 MED ORDER — ROSUVASTATIN CALCIUM 10 MG PO TABS: 10.0000 mg | ORAL_TABLET | Freq: Every day | ORAL | 1 refills | Status: DC

## 2017-10-30 MED FILL — ZYTIGA 250 MG TABLET: 250 | 30 days supply | Qty: 120 | Fill #0

## 2017-10-31 ENCOUNTER — Other Ambulatory Visit: Payer: Self-pay | Admitting: Nurse Practitioner

## 2017-10-31 DIAGNOSIS — C61 Malignant neoplasm of prostate: Secondary | ICD-10-CM

## 2017-11-02 ENCOUNTER — Other Ambulatory Visit: Payer: Self-pay | Admitting: Nurse Practitioner

## 2017-11-03 ENCOUNTER — Other Ambulatory Visit: Payer: Self-pay | Admitting: Oncology

## 2017-11-03 DIAGNOSIS — C61 Malignant neoplasm of prostate: Secondary | ICD-10-CM

## 2017-11-06 ENCOUNTER — Encounter: Payer: Self-pay | Admitting: Nurse Practitioner

## 2017-11-07 ENCOUNTER — Ambulatory Visit: Payer: Self-pay | Admitting: Nurse Practitioner

## 2017-11-07 ENCOUNTER — Ambulatory Visit (INDEPENDENT_AMBULATORY_CARE_PROVIDER_SITE_OTHER): Payer: Medicare Other | Admitting: Nurse Practitioner

## 2017-11-07 ENCOUNTER — Ambulatory Visit: Payer: Medicare Other

## 2017-11-07 ENCOUNTER — Ambulatory Visit: Payer: Self-pay

## 2017-11-07 ENCOUNTER — Ambulatory Visit: Payer: Medicare Other | Admitting: Nurse Practitioner

## 2017-11-07 VITALS — BP 142/82 | HR 76 | Temp 98.0°F | Ht 67.0 in | Wt 208.2 lb

## 2017-11-07 DIAGNOSIS — Z716 Tobacco abuse counseling: Secondary | ICD-10-CM

## 2017-11-07 DIAGNOSIS — I119 Hypertensive heart disease without heart failure: Secondary | ICD-10-CM | POA: Insufficient documentation

## 2017-11-07 DIAGNOSIS — C61 Malignant neoplasm of prostate: Secondary | ICD-10-CM | POA: Diagnosis not present

## 2017-11-07 DIAGNOSIS — E119 Type 2 diabetes mellitus without complications: Secondary | ICD-10-CM | POA: Insufficient documentation

## 2017-11-07 DIAGNOSIS — Z0000 Encounter for general adult medical examination without abnormal findings: Secondary | ICD-10-CM

## 2017-11-07 DIAGNOSIS — I10 Essential (primary) hypertension: Secondary | ICD-10-CM | POA: Diagnosis not present

## 2017-11-07 DIAGNOSIS — Z7982 Long term (current) use of aspirin: Secondary | ICD-10-CM

## 2017-11-07 MED ORDER — VARENICLINE TARTRATE 0.5 MG X 11 & 1 MG X 42 PO MISC: ORAL | 0 refills | Status: DC

## 2017-11-07 NOTE — Progress Notes (Signed)
Subjective:   Kyle George is a 67 y.o. male who presents for Medicare Annual/Subsequent preventive examination.  Review of Systems:  n/a Cardiac Risk Factors include: advanced age (>29mn, >>23women);diabetes mellitus;male gender;hypertension;obesity (BMI >30kg/m2);smoking/ tobacco exposure     Objective:    Vitals: BP (!) 142/82 (BP Location: Left Arm)   Pulse 76   Temp 98 F (36.7 C) (Oral)   Ht '5\' 7"'  (1.702 m)   Wt 208 lb 3.2 oz (94.4 kg)   SpO2 98%   BMI 32.61 kg/m   Body mass index is 32.61 kg/m.  Advanced Directives 11/07/2017 06/01/2016 03/13/2014 11/16/2011  Does Patient Have a Medical Advance Directive? No No No Patient does not have advance directive  Would patient like information on creating a medical advance directive? Yes (MAU/Ambulatory/Procedural Areas - Information given) - No - patient declined information -  Pre-existing out of facility DNR order (yellow form or pink MOST form) - - - No    Tobacco Social History   Tobacco Use  Smoking Status Current Every Day Smoker  . Packs/day: 0.50  . Years: 40.00  . Pack years: 20.00  . 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 Types: Chew  . Quit date: 12 Tobacco Comment   patient would like to start chantix     Ready to quit: Yes Counseling given: No Comment: patient would like to start chantix   Clinical Intake:  Pre-visit preparation completed: Yes  Pain : No/denies pain Pain Score: 0-No pain     Nutritional Status: BMI > 30  Obese Diabetes: Yes CBG done?: No Did pt. bring in CBG monitor from home?: No  How often do you need to have someone help you when you read instructions, pamphlets, or other written materials from your doctor or pharmacy?: 1 - Never What is the last grade level you completed in school?: BS From [employer]  Interpreter Needed?: No  Information entered by :: NAllen LPN  Past Medical History:  Diagnosis Date  . Cancer of prostate ([place]   . Diabetes mellitus  without complication ([place]   . Hyperlipidemia   . Hypertension    Past Surgical History:  Procedure Laterality Date  . HEMORRHOID SURGERY     Family History  Problem Relation Age of Onset  . Hypertension Mother   . Diabetes Mother   . Cancer Father    Social History   Socioeconomic History  . Marital status: Married    Spouse name: Not on file  . Number of children: Not on file  . Years of education: Not on file  . Highest education level: Not on file  Occupational History  . Not on file  Social Needs  . Financial resource strain: Not hard at all  . Food insecurity:    Worry: Never true    Inability: Never true  . Transportation needs:    Medical: No    Non-medical: No  Tobacco Use  . Smoking status: Current Every Day Smoker    Packs/day: 0.50    Years: 40.00    Pack years: 20.00    Types: Cigarettes  . Smokeless tobacco: Former [employer] developer   Types: Chew    Quit date: 153 . Tobacco comment: patient would like to start chantix  Substance and Sexual Activity  . Alcohol use: Yes    Comment: on special occassions  . Drug use: Never  . Sexual activity: Yes  Lifestyle  . Physical activity:    Days  per week: 0 days    Minutes per session: 0 min  . Stress: Not at all  Relationships  . Social connections:    Talks on phone: Not on file    Gets together: Not on file    Attends religious service: Not on file    Active member of club or organization: Not on file    Attends meetings of clubs or organizations: Not on file    Relationship status: Not on file  Other Topics Concern  . Not on file  Social History Narrative  . Not on file    Outpatient Encounter Medications as of 11/07/2017  Medication Sig  . aspirin 81 MG tablet Take 81 mg by mouth daily.  . Blood Pressure Monitoring (BLOOD PRESSURE CUFF) MISC by Does not apply route. Check blood pressure at least 4 times per week 3 hours after taking medication  . Calcium Carbonate-Vitamin D (CALCIUM 600+D PO) Take 1  tablet by mouth daily.  . carvedilol (COREG) 6.25 MG tablet Take 6.25 mg by mouth 2 (two) times daily.  . cholecalciferol (VITAMIN D) 1000 UNITS tablet Take 5,000 Units by mouth daily.  Marland Kitchen glucose blood (ONETOUCH [place]IO) test strip 1 each by Other route as needed for other. Use as instructed to check blood sugars  . Insulin Detemir (LEVEMIR FLEXTOUCH [place]) Inject into the skin.  . Insulin Syringe-Needle U-100 (BD INSULIN SYRINGE U/F) 31G X 5/16" 0.3 ML MISC by Does not apply route. Use as directed with levemir pen  . JANUVIA 100 MG tablet TAKE 1 TABLET BY MOUTH EVERY DAY  . Lancets (ONETOUCH DELICA PLUS [id number]) MISC by Does not apply route. Use as directed to check blood  Sugars 2 times per day dx E11.65  . Multiple Vitamin (MULTIVITAMIN WITH MINERALS) TABS Take 1 tablet by mouth daily.  . Olmesartan-amLODIPine-HCTZ 40-10-25 MG TABS TAKE 1 TABLET BY MOUTH EVERY DAY  . Omega-3 Fatty Acids (FISH OIL PO) Take 1 tablet by mouth once.  . pioglitazone-metformin (ACTOPLUS MET) 15-850 MG per tablet Take 1 tablet by mouth Twice daily.  . predniSONE (DELTASONE) 5 MG tablet TAKE 1 TABLET (5 MG TOTAL) BY MOUTH DAILY WITH BREAKFAST.  . rosuvastatin (CRESTOR) 10 MG tablet Take 1 tablet (10 mg total) by mouth daily.  Marland Kitchen spironolactone (ALDACTONE) 25 MG tablet Take 25 mg by mouth daily.  Marland Kitchen ZYTIGA 250 MG tablet TAKE 4 TABLETS (1,000 MG TOTAL) BY MOUTH DAILY. TAKE ON AN EMPTY STOMACH 1 HOUR BEFORE OR 2 HOURS AFTER A MEAL  . NON FORMULARY Take 1 tablet by mouth daily. Thyroid  action   No facility-administered encounter medications on file as of 11/07/2017.     Activities of Daily Living In your present state of health, do you have any difficulty performing the following activities: 11/07/2017  Hearing? N  Vision? N  Difficulty concentrating or making decisions? N  Walking or climbing stairs? N  Dressing or bathing? N  Doing errands, shopping? N  Preparing Food and eating ? N  Using the Toilet? N  In the  past six months, have you accidently leaked urine? N  Do you have problems with loss of bowel control? N  Managing your Medications? N  Managing your Finances? N  Housekeeping or managing your Housekeeping? N  Some recent data might be hidden    Patient Care Team: [place] Chard, MD as PCP - General (Internal Medicine)   Assessment:   This is a routine wellness examination for [place].  Exercise Activities and  Dietary recommendations Current Exercise Habits: The patient has a physically strenous job, but has no regular exercise apart from work., Exercise limited by: None identified  Goals    . Quit Smoking (pt-stated)     Would like to start chantix if possible       Fall Risk Fall Risk  11/07/2017 12/12/2013  Falls in the past year? No No  Risk for fall due to : Medication side effect -   Is the patient's home free of loose throw rugs in walkways, pet beds, electrical cords, etc?   yes      Grab bars in the bathroom? no      Handrails on the stairs?   no      Adequate lighting?   yes  Timed Get Up and Go Performed: n/a  Depression Screen PHQ 2/9 Scores 11/07/2017 12/12/2013  PHQ - 2 Score 0 0    Cognitive Function     6CIT Screen 11/07/2017  What Year? 0 points  What month? 0 points  What time? 0 points  Count back from 20 0 points  Months in reverse 0 points  Repeat phrase 0 points  Total Score 0     There is no immunization history on file for this patient.  Qualifies for Shingles Vaccine? yes  Screening Tests Health Maintenance  Topic Date Due  . Hepatitis C Screening  1950/10/10  . TETANUS/TDAP  03/08/1968  . COLONOSCOPY  03/09/1999  . PNA vac Low Risk Adult (1 of 2 - PCV13) 03/08/2014  . INFLUENZA VACCINE  08/09/2017   Cancer Screenings: Lung: Low Dose CT Chest recommended if Age 107-80 years, 30 pack-year currently smoking OR have quit w/in 15years. Patient does qualify. Colorectal: up to date per patient  Additional Screenings:  Hepatitis C  Screening:n/a      Plan:    Patient would like to quit smoking. Would like to use chantix.  I have personally reviewed and noted the following in the patient's chart:   . Medical and social history . Use of alcohol, tobacco or illicit drugs  . Current medications and supplements . Functional ability and status . Nutritional status . Physical activity . Advanced directives . List of other physicians . Hospitalizations, surgeries, and ER visits in previous 12 months . Vitals . Screenings to include cognitive, depression, and falls . Referrals and appointments  In addition, I have reviewed and discussed with patient certain preventive protocols, quality metrics, and best practice recommendations. A written personalized care plan for preventive services as well as general preventive health recommendations were provided to patient.     Kellie Simmering, LPN  [id number]

## 2017-11-07 NOTE — Patient Instructions (Signed)
[name] , Thank you for taking time to come for your Medicare Wellness Visit. I appreciate your ongoing commitment to your health goals. Please review the following plan we discussed and let me know if I can assist you in the future.   Screening recommendations/referrals: Colonoscopy: up to date per patient Recommended yearly ophthalmology/optometry visit for glaucoma screening and checkup Recommended yearly dental visit for hygiene and checkup  Vaccinations: Influenza vaccine: will get at a later date Pneumococcal vaccine: decline Tdap vaccine: 04/2013 Shingles vaccine: decline    Advanced directives: Advance directive discussed with you today. I have provided a copy for you to complete at home and have notarized. Once this is complete please bring a copy in to our office so we can scan it into your chart.   Conditions/risks identified: Smoker: Patient would like to stop smoking. He is interested in starting chantix.  Next appointment: 11/07/2017  Preventive Care 68 Years and Older, Male Preventive care refers to lifestyle choices and visits with your health care provider that can promote health and wellness. What does preventive care include?  A yearly physical exam. This is also called an annual well check.  Dental exams once or twice a year.  Routine eye exams. Ask your health care provider how often you should have your eyes checked.  Personal lifestyle choices, including:  Daily care of your teeth and gums.  Regular physical activity.  Eating a healthy diet.  Avoiding tobacco and drug use.  Limiting alcohol use.  Practicing safe sex.  Taking low doses of aspirin every day.  Taking vitamin and mineral supplements as recommended by your health care provider. What happens during an annual well check? The services and screenings done by your health care provider during your annual well check will depend on your age, overall health, lifestyle risk factors, and  family history of disease. Counseling  Your health care provider may ask you questions about your:  Alcohol use.  Tobacco use.  Drug use.  Emotional well-being.  Home and relationship well-being.  Sexual activity.  Eating habits.  History of falls.  Memory and ability to understand (cognition).  Work and work Statistician. Screening  You may have the following tests or measurements:  Height, weight, and BMI.  Blood pressure.  Lipid and cholesterol levels. These may be checked every 5 years, or more frequently if you are over 18 years old.  Skin check.  Lung cancer screening. You may have this screening every year starting at age 18 if you have a 30-pack-year history of smoking and currently smoke or have quit within the past 15 years.  Fecal occult blood test (FOBT) of the stool. You may have this test every year starting at age 17.  Flexible sigmoidoscopy or colonoscopy. You may have a sigmoidoscopy every 5 years or a colonoscopy every 10 years starting at age 31.  Prostate cancer screening. Recommendations will vary depending on your family history and other risks.  Hepatitis C blood test.  Hepatitis B blood test.  Sexually transmitted disease (STD) testing.  Diabetes screening. This is done by checking your blood sugar (glucose) after you have not eaten for a while (fasting). You may have this done every 1-3 years.  Abdominal aortic aneurysm (AAA) screening. You may need this if you are a current or former smoker.  Osteoporosis. You may be screened starting at age 39 if you are at high risk. Talk with your health care provider about your test results, treatment options, and if necessary,  the need for more tests. Vaccines  Your health care provider may recommend certain vaccines, such as:  Influenza vaccine. This is recommended every year.  Tetanus, diphtheria, and acellular pertussis (Tdap, Td) vaccine. You may need a Td booster every 10 years.  Zoster  vaccine. You may need this after age 85.  Pneumococcal 13-valent conjugate (PCV13) vaccine. One dose is recommended after age 48.  Pneumococcal polysaccharide (PPSV23) vaccine. One dose is recommended after age 83. Talk to your health care provider about which screenings and vaccines you need and how often you need them. This information is not intended to replace advice given to you by your health care provider. Make sure you discuss any questions you have with your health care provider. Document Released: 01/22/2015 Document Revised: 09/15/2015 Document Reviewed: 10/27/2014 Elsevier Interactive Patient Education  2017 [employer] Prevention in the Home Falls can cause injuries. They can happen to people of all ages. There are many things you can do to make your home safe and to help prevent falls. What can I do on the outside of my home?  Regularly fix the edges of walkways and driveways and fix any cracks.  Remove anything that might make you trip as you walk through a door, such as a raised step or threshold.  Trim any bushes or trees on the path to your home.  Use bright outdoor lighting.  Clear any walking paths of anything that might make someone trip, such as rocks or tools.  Regularly check to see if handrails are loose or broken. Make sure that both sides of any steps have handrails.  Any raised decks and porches should have guardrails on the edges.  Have any leaves, snow, or ice cleared regularly.  Use sand or salt on walking paths during winter.  Clean up any spills in your garage right away. This includes oil or grease spills. What can I do in the bathroom?  Use night lights.  Install grab bars by the toilet and in the tub and shower. Do not use towel bars as grab bars.  Use non-skid mats or decals in the tub or shower.  If you need to sit down in the shower, use a plastic, non-slip stool.  Keep the floor dry. Clean up any water that spills on the  floor as soon as it happens.  Remove soap buildup in the tub or shower regularly.  Attach bath mats securely with double-sided non-slip rug tape.  Do not have throw rugs and other things on the floor that can make you trip. What can I do in the bedroom?  Use night lights.  Make sure that you have a light by your bed that is easy to reach.  Do not use any sheets or blankets that are too big for your bed. They should not hang down onto the floor.  Have a firm chair that has side arms. You can use this for support while you get dressed.  Do not have throw rugs and other things on the floor that can make you trip. What can I do in the kitchen?  Clean up any spills right away.  Avoid walking on wet floors.  Keep items that you use a lot in easy-to-reach places.  If you need to reach something above you, use a strong step stool that has a grab bar.  Keep electrical cords out of the way.  Do not use floor polish or wax that makes floors slippery. If you must use  wax, use non-skid floor wax.  Do not have throw rugs and other things on the floor that can make you trip. What can I do with my stairs?  Do not leave any items on the stairs.  Make sure that there are handrails on both sides of the stairs and use them. Fix handrails that are broken or loose. Make sure that handrails are as long as the stairways.  Check any carpeting to make sure that it is firmly attached to the stairs. Fix any carpet that is loose or worn.  Avoid having throw rugs at the top or bottom of the stairs. If you do have throw rugs, attach them to the floor with carpet tape.  Make sure that you have a light switch at the top of the stairs and the bottom of the stairs. If you do not have them, ask someone to add them for you. What else can I do to help prevent falls?  Wear shoes that:  Do not have high heels.  Have rubber bottoms.  Are comfortable and fit you well.  Are closed at the toe. Do not wear  sandals.  If you use a stepladder:  Make sure that it is fully opened. Do not climb a closed stepladder.  Make sure that both sides of the stepladder are locked into place.  Ask someone to hold it for you, if possible.  Clearly mark and make sure that you can see:  Any grab bars or handrails.  First and last steps.  Where the edge of each step is.  Use tools that help you move around (mobility aids) if they are needed. These include:  Canes.  Walkers.  Scooters.  Crutches.  Turn on the lights when you go into a dark area. Replace any light bulbs as soon as they burn out.  Set up your furniture so you have a clear path. Avoid moving your furniture around.  If any of your floors are uneven, fix them.  If there are any pets around you, be aware of where they are.  Review your medicines with your doctor. Some medicines can make you feel dizzy. This can increase your chance of falling. Ask your doctor what other things that you can do to help prevent falls. This information is not intended to replace advice given to you by your health care provider. Make sure you discuss any questions you have with your health care provider. Document Released: 10/22/2008 Document Revised: 06/03/2015 Document Reviewed: 01/30/2014 Elsevier Interactive Patient Education  2017 [employer].

## 2017-11-07 NOTE — Progress Notes (Signed)
Subjective:     Patient ID: Kyle George , male    DOB: [birth year] , 67 y.o.   MRN: [id number]   Chief Complaint  Patient presents with  . Hypertension  . Diabetes    HPI  Hypertension  This is a chronic problem. The current episode started more than 1 year ago. The problem is unchanged. The problem is controlled. Pertinent negatives include no headaches, malaise/fatigue, palpitations or shortness of breath. There are no associated agents to hypertension. There are no known risk factors for coronary artery disease. Past treatments include ACE inhibitors. There are no compliance problems.  There is no history of kidney disease.  Diabetes  Pertinent negatives for hypoglycemia include no headaches.     Past Medical History:  Diagnosis Date  . Cancer of prostate ([place])   . Diabetes mellitus without complication ([place])   . Hyperlipidemia   . Hypertension      Family History  Problem Relation Age of Onset  . Hypertension Mother   . Diabetes Mother   . Cancer Father      Current Outpatient Medications:  .  aspirin 81 MG tablet, Take 81 mg by mouth daily., Disp: , Rfl:  .  Blood Pressure Monitoring (BLOOD PRESSURE CUFF) MISC, by Does not apply route. Check blood pressure at least 4 times per week 3 hours after taking medication, Disp: , Rfl:  .  Calcium Carbonate-Vitamin D (CALCIUM 600+D PO), Take 1 tablet by mouth daily., Disp: , Rfl:  .  carvedilol (COREG) 6.25 MG tablet, Take 6.25 mg by mouth 2 (two) times daily., Disp: , Rfl: 3 .  cholecalciferol (VITAMIN D) 1000 UNITS tablet, Take 5,000 Units by mouth daily., Disp: , Rfl:  .  glucose blood (ONETOUCH VERIO) test strip, 1 each by Other route as needed for other. Use as instructed to check blood sugars, Disp: , Rfl:  .  Insulin Detemir (LEVEMIR FLEXTOUCH [place]), Inject into the skin., Disp: , Rfl:  .  Insulin Syringe-Needle U-100 (BD INSULIN SYRINGE U/F) 31G X 5/16" 0.3 ML MISC, by Does not apply route. Use as directed with levemir  pen, Disp: , Rfl:  .  JANUVIA 100 MG tablet, TAKE 1 TABLET BY MOUTH EVERY DAY, Disp: 90 tablet, Rfl: 0 .  Lancets (ONETOUCH DELICA PLUS [id number]) MISC, by Does not apply route. Use as directed to check blood  Sugars 2 times per day dx E11.65, Disp: , Rfl:  .  Multiple Vitamin (MULTIVITAMIN WITH MINERALS) TABS, Take 1 tablet by mouth daily., Disp: , Rfl:  .  NON FORMULARY, Take 1 tablet by mouth daily. Thyroid  action, Disp: , Rfl:  .  Olmesartan-amLODIPine-HCTZ 40-10-25 MG TABS, TAKE 1 TABLET BY MOUTH EVERY DAY, Disp: 30 tablet, Rfl: 2 .  Omega-3 Fatty Acids (FISH OIL PO), Take 1 tablet by mouth once., Disp: , Rfl:  .  pioglitazone-metformin (ACTOPLUS MET) 15-850 MG per tablet, Take 1 tablet by mouth Twice daily., Disp: , Rfl:  .  predniSONE (DELTASONE) 5 MG tablet, TAKE 1 TABLET (5 MG TOTAL) BY MOUTH DAILY WITH BREAKFAST., Disp: 30 tablet, Rfl: 1 .  rosuvastatin (CRESTOR) 10 MG tablet, Take 1 tablet (10 mg total) by mouth daily., Disp: 90 tablet, Rfl: 1 .  spironolactone (ALDACTONE) 25 MG tablet, Take 25 mg by mouth daily., Disp: , Rfl:  .  varenicline (CHANTIX STARTING MONTH PAK) 0.5 MG X 11 & 1 MG X 42 tablet, Take one 0.5 mg tablet by mouth once daily for 3 days, then  increase to one 0.5 mg tablet twice daily for 4 days, then increase to one 1 mg tablet twice daily., Disp: 53 tablet, Rfl: 0 .  ZYTIGA 250 MG tablet, TAKE 4 TABLETS (1,000 MG TOTAL) BY MOUTH DAILY. TAKE ON AN EMPTY STOMACH 1 HOUR BEFORE OR 2 HOURS AFTER A MEAL, Disp: 120 tablet, Rfl: 0   Allergies  Allergen Reactions  . Penicillins Nausea And Vomiting     Review of Systems  Constitutional: Negative.  Negative for malaise/fatigue.  HENT: Negative.   Eyes: Negative.   Respiratory: Negative.  Negative for shortness of breath.   Cardiovascular: Negative.  Negative for palpitations.  Gastrointestinal: Negative.   Endocrine: Negative.   Genitourinary: Negative.   Musculoskeletal: Negative.   Skin: Negative.    Allergic/Immunologic: Negative.   Neurological: Negative.  Negative for headaches.  Hematological: Negative.   Psychiatric/Behavioral: Negative.      Today's Vitals   11/07/17 1550  BP: (!) 142/82  Pulse: 76  Temp: 98 F (36.7 C)  TempSrc: Oral  SpO2: 98%  Weight: 208 lb 3.2 oz (94.4 kg)   Body mass index is 32.61 kg/m.   Objective:  Physical Exam  Constitutional: He is oriented to person, place, and time. He appears well-developed and well-nourished.  HENT:  Head: Normocephalic.  Right Ear: External ear normal.  Left Ear: External ear normal.  Nose: Nose normal.  Mouth/Throat: Oropharynx is clear and moist.  Eyes: Pupils are equal, round, and reactive to light. Conjunctivae and EOM are normal.  Neck: Normal range of motion. Neck supple.  Cardiovascular: Normal rate, regular rhythm, normal heart sounds and intact distal pulses.  Pulmonary/Chest: Effort normal and breath sounds normal.  Abdominal: Soft. Bowel sounds are normal.  Musculoskeletal: Normal range of motion.  Neurological: He is alert and oriented to person, place, and time.  Skin: Skin is warm and dry. Capillary refill takes less than 2 seconds.  Psychiatric: He has a normal mood and affect.        Assessment And Plan:   1. Essential hypertension  Chronic, fair control today,encouraged to limit his salt intake.  Continue with current medications  AWV done today - Hemoglobin A1c - CMP14 + Anion Gap - Lipid Profile - CBC no Diff  2. Type 2 diabetes mellitus without complication, without long-term current use of insulin (HCC)  Chronic, controlled  Continue with current medications - Lipid Profile - Ambulatory referral to Ophthalmology  3. Encounter for tobacco use cessation counseling  Discussed importance of smoking cessation and to set a goal to quit.  - varenicline (CHANTIX STARTING MONTH PAK) 0.5 MG X 11 & 1 MG X 42 tablet; Take one 0.5 mg tablet by mouth once daily for 3 days, then  increase to one 0.5 mg tablet twice daily for 4 days, then increase to one 1 mg tablet twice daily.  Dispense: 53 tablet; Refill: 0  4. Prostate cancer ([place])  - PSA    [name], [name]

## 2017-11-08 LAB — LIPID PANEL
Chol/HDL Ratio: 1.9 ratio (ref 0.0–5.0)
Cholesterol, Total: 96 mg/dL — ABNORMAL LOW (ref 100–199)
HDL: 51 mg/dL (ref >39)
LDL Calculated: 23 mg/dL (ref 0–99)
Triglycerides: 109 mg/dL (ref 0–149)
VLDL Cholesterol Cal: 22 mg/dL (ref 5–40)

## 2017-11-08 LAB — CMP14 + ANION GAP
ALT: 10 IU/L (ref 0–44)
AST: 14 IU/L (ref 0–40)
Albumin/Globulin Ratio: 1.4 (ref 1.2–2.2)
Albumin: 4 g/dL (ref 3.6–4.8)
Alkaline Phosphatase: 70 IU/L (ref 39–117)
Anion Gap: 15 mmol/L (ref 10.0–18.0)
BUN/Creatinine Ratio: 17 (ref 10–24)
BUN: 13 mg/dL (ref 8–27)
Bilirubin Total: <0.2 mg/dL (ref 0.0–1.2)
CO2: 24 mmol/L (ref 20–29)
Calcium: 9.3 mg/dL (ref 8.6–10.2)
Chloride: 101 mmol/L (ref 96–106)
Creatinine, Ser: 0.76 mg/dL (ref 0.76–1.27)
GFR calc Af Amer: 108 mL/min/{1.73_m2} (ref >59)
GFR calc non Af Amer: 94 mL/min/{1.73_m2} (ref >59)
Globulin, Total: 2.9 g/dL (ref 1.5–4.5)
Glucose: 116 mg/dL — ABNORMAL HIGH (ref 65–99)
Potassium: 3.8 mmol/L (ref 3.5–5.2)
Sodium: 140 mmol/L (ref 134–144)
Total Protein: 6.9 g/dL (ref 6.0–8.5)

## 2017-11-08 LAB — HEMOGLOBIN A1C
Est. average glucose Bld gHb Est-mCnc: 143 mg/dL
Hgb A1c MFr Bld: 6.6 % — ABNORMAL HIGH (ref 4.8–5.6)

## 2017-11-08 LAB — CBC
Hematocrit: 39.3 % (ref 37.5–51.0)
Hemoglobin: 13 g/dL (ref 13.0–17.7)
MCH: 32.9 pg (ref 26.6–33.0)
MCHC: 33.1 g/dL (ref 31.5–35.7)
MCV: 100 fL — ABNORMAL HIGH (ref 79–97)
Platelets: 215 10*3/uL (ref 150–450)
RBC: 3.95 x10E6/uL — ABNORMAL LOW (ref 4.14–5.80)
RDW: 13.3 % (ref 12.3–15.4)
WBC: 9.5 10*3/uL (ref 3.4–10.8)

## 2017-11-08 LAB — PSA: Prostate Specific Ag, Serum: 1 ng/mL (ref 0.0–4.0)

## 2017-11-23 ENCOUNTER — Other Ambulatory Visit: Payer: Self-pay | Admitting: Oncology

## 2017-11-23 DIAGNOSIS — C61 Malignant neoplasm of prostate: Secondary | ICD-10-CM

## 2017-11-27 ENCOUNTER — Encounter: Payer: Self-pay | Admitting: Nurse Practitioner

## 2017-11-27 MED FILL — ZYTIGA 250 MG TABLET: 250 | 30 days supply | Qty: 120 | Fill #0

## 2017-12-11 ENCOUNTER — Other Ambulatory Visit: Payer: Self-pay | Admitting: Nurse Practitioner

## 2017-12-19 ENCOUNTER — Other Ambulatory Visit: Payer: Self-pay | Admitting: Oncology

## 2017-12-19 ENCOUNTER — Other Ambulatory Visit: Payer: Self-pay | Admitting: Internal Medicine

## 2017-12-19 DIAGNOSIS — C61 Malignant neoplasm of prostate: Secondary | ICD-10-CM

## 2017-12-21 ENCOUNTER — Other Ambulatory Visit: Payer: Self-pay | Admitting: Oncology

## 2017-12-21 ENCOUNTER — Other Ambulatory Visit: Payer: Self-pay | Admitting: Nurse Practitioner

## 2017-12-21 DIAGNOSIS — C61 Malignant neoplasm of prostate: Secondary | ICD-10-CM

## 2017-12-21 MED FILL — ZYTIGA 250 MG TABLET: 250 | 30 days supply | Qty: 120 | Fill #0

## 2018-01-17 ENCOUNTER — Other Ambulatory Visit: Payer: Self-pay | Admitting: Oncology

## 2018-01-17 DIAGNOSIS — C61 Malignant neoplasm of prostate: Secondary | ICD-10-CM

## 2018-01-18 ENCOUNTER — Telehealth: Payer: Self-pay

## 2018-01-18 MED FILL — ZYTIGA 250 MG TABLET: 250 | 30 days supply | Qty: 120 | Fill #0

## 2018-01-18 NOTE — Telephone Encounter (Signed)
Oral Oncology [facility] Encounter  Kyle George contacted me that the [place] was not working.  I called [employer] and they had to reinstate the grant since it had expired due to it not being used while his insurance was paying 100%. The expiration date is 02/05/18.   I was successful at securing a grant with [employer]) for $7,300. This will keep the out of pocket expense for Zytiga at $0. The grant information is as follows and has been shared with [employer].  Approval dates: 02/06/18-02/06/19 ID: [id number] Group: [id number] BIN: [id number] PCN: [place] Kyle George Phone [phone] Fax [phone]

## 2018-01-27 ENCOUNTER — Other Ambulatory Visit: Payer: Self-pay | Admitting: Nurse Practitioner

## 2018-01-27 DIAGNOSIS — C61 Malignant neoplasm of prostate: Secondary | ICD-10-CM

## 2018-02-06 ENCOUNTER — Inpatient Hospital Stay (HOSPITAL_BASED_OUTPATIENT_CLINIC_OR_DEPARTMENT_OTHER): Payer: Medicare Other | Admitting: Oncology

## 2018-02-06 ENCOUNTER — Inpatient Hospital Stay: Payer: Medicare Other | Attending: Oncology

## 2018-02-06 ENCOUNTER — Inpatient Hospital Stay: Payer: Medicare Other

## 2018-02-06 ENCOUNTER — Telehealth: Payer: Self-pay | Admitting: Oncology

## 2018-02-06 VITALS — BP 138/76 | HR 82 | Temp 98.2°F | Resp 189 | Ht 67.0 in | Wt 214.0 lb

## 2018-02-06 VITALS — BP 115/65

## 2018-02-06 DIAGNOSIS — Z794 Long term (current) use of insulin: Secondary | ICD-10-CM | POA: Insufficient documentation

## 2018-02-06 DIAGNOSIS — E119 Type 2 diabetes mellitus without complications: Secondary | ICD-10-CM | POA: Diagnosis not present

## 2018-02-06 DIAGNOSIS — I10 Essential (primary) hypertension: Secondary | ICD-10-CM | POA: Insufficient documentation

## 2018-02-06 DIAGNOSIS — Z79899 Other long term (current) drug therapy: Secondary | ICD-10-CM | POA: Diagnosis not present

## 2018-02-06 DIAGNOSIS — Z79818 Long term (current) use of other agents affecting estrogen receptors and estrogen levels: Secondary | ICD-10-CM | POA: Insufficient documentation

## 2018-02-06 DIAGNOSIS — C61 Malignant neoplasm of prostate: Secondary | ICD-10-CM | POA: Diagnosis present

## 2018-02-06 DIAGNOSIS — Z7982 Long term (current) use of aspirin: Secondary | ICD-10-CM

## 2018-02-06 DIAGNOSIS — C7951 Secondary malignant neoplasm of bone: Secondary | ICD-10-CM | POA: Insufficient documentation

## 2018-02-06 LAB — CMP (CANCER CENTER ONLY)
ALT: 12 U/L (ref 0–44)
AST: 11 U/L — ABNORMAL LOW (ref 15–41)
Albumin: 3.8 g/dL (ref 3.5–5.0)
Alkaline Phosphatase: 67 U/L (ref 38–126)
Anion gap: 12 (ref 5–15)
BUN: 20 mg/dL (ref 8–23)
CO2: 27 mmol/L (ref 22–32)
Calcium: 9.5 mg/dL (ref 8.9–10.3)
Chloride: 102 mmol/L (ref 98–111)
Creatinine: 1.06 mg/dL (ref 0.61–1.24)
GFR, Est AFR Am: >60 mL/min (ref >60)
GFR, Est Non Af Am: >60 mL/min (ref >60)
Glucose, Bld: 219 mg/dL — ABNORMAL HIGH (ref 70–99)
Potassium: 3.7 mmol/L (ref 3.5–5.1)
Sodium: 141 mmol/L (ref 135–145)
Total Bilirubin: 0.4 mg/dL (ref 0.3–1.2)
Total Protein: 7.4 g/dL (ref 6.5–8.1)

## 2018-02-06 LAB — CBC WITH DIFFERENTIAL (CANCER CENTER ONLY)
Abs Immature Granulocytes: 0.03 10*3/uL (ref 0.00–0.07)
Basophils Absolute: 0 10*3/uL (ref 0.0–0.1)
Basophils Relative: 0 %
Eosinophils Absolute: 0.1 10*3/uL (ref 0.0–0.5)
Eosinophils Relative: 1 %
HCT: 41.4 % (ref 39.0–52.0)
Hemoglobin: 13.8 g/dL (ref 13.0–17.0)
Immature Granulocytes: 0 %
Lymphocytes Relative: 14 %
Lymphs Abs: 1.3 10*3/uL (ref 0.7–4.0)
MCH: 33.4 pg (ref 26.0–34.0)
MCHC: 33.3 g/dL (ref 30.0–36.0)
MCV: 100.2 fL — ABNORMAL HIGH (ref 80.0–100.0)
Monocytes Absolute: 0.4 10*3/uL (ref 0.1–1.0)
Monocytes Relative: 4 %
Neutro Abs: 7.3 10*3/uL (ref 1.7–7.7)
Neutrophils Relative %: 81 %
Platelet Count: 151 10*3/uL (ref 150–400)
RBC: 4.13 MIL/uL — ABNORMAL LOW (ref 4.22–5.81)
RDW: 14 % (ref 11.5–15.5)
WBC Count: 9.1 10*3/uL (ref 4.0–10.5)
nRBC: 0 % (ref 0.0–0.2)

## 2018-02-06 MED ORDER — LEUPROLIDE ACETATE (4 MONTH) 30 MG IM KIT
30.0000 mg | PACK | Freq: Once | INTRAMUSCULAR | Status: AC
  Administered 2018-02-06: 30 mg via INTRAMUSCULAR
  Filled 2018-02-06: qty 30

## 2018-02-06 MED ORDER — LEUPROLIDE ACETATE (3 MONTH) 22.5 MG IM KIT
PACK | INTRAMUSCULAR | Status: AC
  Filled 2018-02-06: qty 22.5

## 2018-02-06 NOTE — Patient Instructions (Signed)

## 2018-02-06 NOTE — Telephone Encounter (Signed)
Scheduled appt per 01/29 los. ° °Printed calendar and avs. °

## 2018-02-06 NOTE — Progress Notes (Signed)
Hematology and Oncology Follow Up Visit  Kyle George [id number] [birth year] 68 y.o. 02/06/2018 10:20 AM [place] Chard, MDSanders, Bailey Mech, MD   Principle Diagnosis: 68 year old man with advanced prostate cancer with lymphadenopathy and bone disease diagnosed in 2013.  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  Prior Therapy: Status post androgen deprivation and initially with [place] subsequently with Lupron with a PSA dropping down to 500 and December 2015.  He failed to follow-up with the PSA rising up to 1400 and April 2018.  Current therapy:  Lupron 30 mg injection every 4 months.     Zytiga 1000 mg daily with prednisone at 5 mg daily started in August 2018.  Interim History:  Kyle George is here for a follow-up visit.  Since last visit, he reports no major changes in his health.  He continues to be active and has started a new job in the recent months.  He continues to tolerate Zytiga without any major complaints.  He denies excessive fatigue, tiredness or edema.  He denies any worsening bone pain or pathological fractures.  He denies any recent hospitalizations or illnesses.  His appetite has remained excellent and has gained weight.   He does not report any headaches or blurry vision or syncope.  He denies any confusion or lethargy.  He does not report any fevers, chills, sweats, weight loss.  He did not report any chest pain, palpitation, orthopnea or dyspnea on exertion.  He does not report any cough, shortness of breath or hemoptysis. He does not report any nausea, vomiting or early satiety.  He denies any constipation or diarrhea.  He does not report any frequency, urgency hematuria or dysuria.  He does not report any joint pain or deformity.  He does not report any skin irritation or bruising.  He does not report any lymphadenopathy or bleeding tendency.  He does not report any anxiety or depression the rest of his review  of systems is negative.   Medications: I have reviewed the patient's current medications.  Current Outpatient Medications  Medication Sig Dispense Refill  . aspirin 81 MG tablet Take 81 mg by mouth daily.    . Blood Pressure Monitoring (BLOOD PRESSURE CUFF) MISC by Does not apply route. Check blood pressure at least 4 times per week 3 hours after taking medication    . Calcium Carbonate-Vitamin D (CALCIUM 600+D PO) Take 1 tablet by mouth daily.    . carvedilol (COREG) 6.25 MG tablet TAKE 1 TABLET BY MOUTH TWICE A DAY WITH FOOD 180 tablet 1  . cholecalciferol (VITAMIN D) 1000 UNITS tablet Take 5,000 Units by mouth daily.    [place] Kitchen glucose blood (ONETOUCH VERIO) test strip 1 each by Other route as needed for other. Use as instructed to check blood sugars    . Insulin Detemir (LEVEMIR FLEXTOUCH [place]) Inject into the skin.    . Insulin Syringe-Needle U-100 (BD INSULIN SYRINGE U/F) 31G X 5/16" 0.3 ML MISC by Does not apply route. Use as directed with levemir pen    . JANUVIA 100 MG tablet TAKE 1 TABLET BY MOUTH EVERY DAY 90 tablet 0  . Lancets (ONETOUCH DELICA PLUS [id number]) MISC by Does not apply route. Use as directed to check blood  Sugars 2 times per day dx E11.65    . Multiple Vitamin (MULTIVITAMIN WITH MINERALS) TABS Take 1 tablet by mouth daily.    . NON FORMULARY Take 1 tablet by mouth daily.  Thyroid  action    . Olmesartan-amLODIPine-HCTZ 40-10-25 MG TABS TAKE 1 TABLET BY MOUTH EVERY DAY 30 tablet 2  . Omega-3 Fatty Acids (FISH OIL PO) Take 1 tablet by mouth once.    . pioglitazone-metformin (ACTOPLUS MET) 15-850 MG tablet TAKE 1 TABLET BY MOUTH TWICE A DAY 180 tablet 1  . predniSONE (DELTASONE) 5 MG tablet TAKE 1 TABLET (5 MG TOTAL) BY MOUTH DAILY WITH BREAKFAST. 30 tablet 1  . rosuvastatin (CRESTOR) 10 MG tablet Take 1 tablet (10 mg total) by mouth daily. 90 tablet 1  . spironolactone (ALDACTONE) 25 MG tablet Take 25 mg by mouth daily.    . varenicline (CHANTIX STARTING MONTH PAK) 0.5 MG X 11  & 1 MG X 42 tablet Take one 0.5 mg tablet by mouth once daily for 3 days, then increase to one 0.5 mg tablet twice daily for 4 days, then increase to one 1 mg tablet twice daily. 53 tablet 0  . ZYTIGA 250 MG tablet TAKE 4 TABLETS (1,000 MG TOTAL) BY MOUTH DAILY. TAKE ON AN EMPTY STOMACH 1 HOUR BEFORE OR 2 HOURS AFTER A MEAL 120 tablet 0   No current facility-administered medications for this visit.      Allergies:  Allergies  Allergen Reactions  . Penicillins Nausea And Vomiting    Past Medical History, Surgical history, Social history, and Family History were reviewed and updated.   Physical Exam: Blood pressure 138/76, pulse 82, temperature 98.2 F (36.8 C), temperature source Oral, resp. rate (!) 189, height '5\' 7"'  (1.702 m), weight 214 lb (97.1 kg), SpO2 98 %.    ECOG: 0   General appearance: Alert, awake without any distress. Head: Atraumatic without abnormalities Oropharynx: Without any thrush or ulcers. Eyes: No scleral icterus. Lymph nodes: No lymphadenopathy noted in the cervical, supraclavicular, or axillary nodes Heart:regular rate and rhythm, without any murmurs or gallops.   Lung: Clear to auscultation without any rhonchi, wheezes or dullness to percussion. Abdomin: Soft, nontender without any shifting dullness or ascites. Musculoskeletal: No clubbing or cyanosis. Neurological: No motor or sensory deficits. Skin: No rashes or lesions.     Lab Results: Lab Results  Component Value Date   WBC 9.5 11/07/2017   HGB 13.0 11/07/2017   HCT 39.3 11/07/2017   MCV 100 (H) 11/07/2017   PLT 215 11/07/2017             Results for Kyle George, Kyle George (MRN [id number]) as of 02/06/2018 10:20  Ref. Range 04/17/2017 14:29 06/13/2017 10:14 10/10/2017 12:48 11/07/2017 15:50  Prostate Specific Ag, Serum Latest Ref Range: 0.0 - 4.0 ng/mL <0.1 <0.1 0.4 1.0      Impression and Plan:   68 year old man with:  1.  Advanced prostate cancer with adenopathy noted in 2013.  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  He remains on Zytiga without any major complications at this time.  His PSA had an excellent response but as of late has been rising.  Risks and benefits of continuing this therapy long-term and alternative therapy were also reviewed.  These therapies would include Xtandi, and Xofigo and systemic chemotherapy.  I recommended obtaining staging work-up with CT scan and bone scan prior to the next visit to evaluate his disease status and determine the next course of action.  If his PSA continues to rise and he has progression of disease, different salvage therapy may be needed as he is developing castration-resistant disease.  At this time, we will continue Zytiga and restage him  before the next visit.  2. Androgen depravation: He continues to be on Lupron 30 mg every 4 months.  I recommended continuing this indefinitely.  Long-term complications were reiterated which includes weight gain, osteoporosis among others.  3. Diabetes: Blood sugar remains reasonably controlled.  No exacerbation on Zytiga and prednisone.  4. Bone health: Remains on calcium and vitamin D supplements.  I will update his bone scan and will consider Xgeva if he has worsening bone disease after obtaining dental clearance.  5.  Hypertension: Her blood pressure remains within normal range at this time.  6.  Prognosis and goals of care: His treatment remains palliative although his disease has been under excellent control with excellent performance status and aggressive therapy is warranted.  7. Follow-up: Will be in 4 months.   25 minutes was spent with the patient face-to-face today.  More than 50% of time was dedicated to updating his disease status, treatment options and complications related to therapy.  We also reviewed the natural progression of this disease and prognosis.    Zola Button, MD 1/29/202010:20 AM

## 2018-02-07 LAB — PROSTATE-SPECIFIC AG, SERUM (LABCORP): Prostate Specific Ag, Serum: 2.9 ng/mL (ref 0.0–4.0)

## 2018-02-13 ENCOUNTER — Other Ambulatory Visit: Payer: Self-pay | Admitting: Oncology

## 2018-02-13 DIAGNOSIS — C61 Malignant neoplasm of prostate: Secondary | ICD-10-CM

## 2018-02-22 MED FILL — ZYTIGA 250 MG TABLET: 250 | 30 days supply | Qty: 120 | Fill #0

## 2018-03-04 ENCOUNTER — Ambulatory Visit: Payer: Medicare Other | Admitting: Nurse Practitioner

## 2018-03-04 ENCOUNTER — Telehealth: Payer: Self-pay

## 2018-03-04 NOTE — Telephone Encounter (Signed)
Left the pt a message that I was returning his call to reschedule his appointment.

## 2018-03-18 ENCOUNTER — Other Ambulatory Visit: Payer: Self-pay | Admitting: Nurse Practitioner

## 2018-03-20 ENCOUNTER — Other Ambulatory Visit: Payer: Self-pay | Admitting: Oncology

## 2018-03-20 DIAGNOSIS — C61 Malignant neoplasm of prostate: Secondary | ICD-10-CM

## 2018-03-21 MED FILL — ZYTIGA 250 MG TABLET: 250 | 30 days supply | Qty: 120 | Fill #0

## 2018-03-26 ENCOUNTER — Other Ambulatory Visit: Payer: Self-pay

## 2018-03-26 DIAGNOSIS — C61 Malignant neoplasm of prostate: Secondary | ICD-10-CM

## 2018-03-26 MED ORDER — PREDNISONE 5 MG PO TABS: 5.0000 mg | ORAL_TABLET | Freq: Every day | ORAL | 1 refills | Status: DC

## 2018-04-16 ENCOUNTER — Other Ambulatory Visit: Payer: Self-pay | Admitting: Oncology

## 2018-04-16 DIAGNOSIS — C61 Malignant neoplasm of prostate: Secondary | ICD-10-CM

## 2018-04-24 MED FILL — ZYTIGA 250 MG TABLET: 250 | 30 days supply | Qty: 120 | Fill #0

## 2018-05-07 ENCOUNTER — Other Ambulatory Visit: Payer: Self-pay

## 2018-05-07 ENCOUNTER — Telehealth: Payer: Self-pay

## 2018-05-07 MED ORDER — SPIRONOLACTONE 25 MG PO TABS: 25.0000 mg | ORAL_TABLET | Freq: Every day | ORAL | 0 refills | Status: DC

## 2018-05-07 NOTE — Telephone Encounter (Signed)
The pt was scheduled a virtual appointment for blood pressure f/u with his consent.  The pt was told that he needed to keep the appt to get future refills

## 2018-05-07 NOTE — Telephone Encounter (Signed)
Called pt to schedule him an office visit due ti him not returning for his 3 month f/u left v/m to call office. Patient will not receive any more refills until he is seen. YRL,RMA

## 2018-05-08 ENCOUNTER — Other Ambulatory Visit: Payer: Self-pay | Admitting: Nurse Practitioner

## 2018-05-08 ENCOUNTER — Other Ambulatory Visit: Payer: Self-pay | Admitting: Internal Medicine

## 2018-05-08 DIAGNOSIS — E782 Mixed hyperlipidemia: Secondary | ICD-10-CM

## 2018-05-11 ENCOUNTER — Other Ambulatory Visit: Payer: Self-pay | Admitting: Oncology

## 2018-05-11 DIAGNOSIS — C61 Malignant neoplasm of prostate: Secondary | ICD-10-CM

## 2018-05-16 ENCOUNTER — Encounter: Payer: Self-pay | Admitting: Nurse Practitioner

## 2018-05-16 ENCOUNTER — Other Ambulatory Visit: Payer: Self-pay | Admitting: Oncology

## 2018-05-16 ENCOUNTER — Ambulatory Visit (INDEPENDENT_AMBULATORY_CARE_PROVIDER_SITE_OTHER): Payer: Medicare Other | Admitting: Nurse Practitioner

## 2018-05-16 ENCOUNTER — Other Ambulatory Visit: Payer: Self-pay

## 2018-05-16 ENCOUNTER — Ambulatory Visit: Payer: Self-pay | Admitting: Nurse Practitioner

## 2018-05-16 VITALS — BP 117/62 | Ht 69.0 in | Wt 205.0 lb

## 2018-05-16 DIAGNOSIS — Z8249 Family history of ischemic heart disease and other diseases of the circulatory system: Secondary | ICD-10-CM | POA: Diagnosis not present

## 2018-05-16 DIAGNOSIS — Z833 Family history of diabetes mellitus: Secondary | ICD-10-CM

## 2018-05-16 DIAGNOSIS — E119 Type 2 diabetes mellitus without complications: Secondary | ICD-10-CM | POA: Diagnosis not present

## 2018-05-16 DIAGNOSIS — I10 Essential (primary) hypertension: Secondary | ICD-10-CM | POA: Diagnosis not present

## 2018-05-16 DIAGNOSIS — C61 Malignant neoplasm of prostate: Secondary | ICD-10-CM

## 2018-05-16 DIAGNOSIS — E782 Mixed hyperlipidemia: Secondary | ICD-10-CM | POA: Diagnosis not present

## 2018-05-16 MED ORDER — SPIRONOLACTONE 25 MG PO TABS: 25.0000 mg | ORAL_TABLET | Freq: Every day | ORAL | 0 refills | Status: DC

## 2018-05-16 NOTE — Progress Notes (Signed)
Virtual Visit via Video (Doxy[employer])    This visit type was conducted due to national recommendations for restrictions regarding the COVID-19 Pandemic (e.g. social distancing) in an effort to limit this patient's exposure and mitigate transmission in our community.  Patients identity confirmed using two different identifiers.  This format is felt to be most appropriate for this patient at this time.  All issues noted in this document were discussed and addressed.  No physical exam was performed (except for noted visual exam findings with Video Visits).    Date:  05/16/2018   ID:  Kyle George, DOB Apr 10, 1950, MRN [id number]  Patient Location:  Home - spoke with Kyle George  Provider location:   Office    Chief Complaint:  Diabetes follow up  History of Present Illness:    Kyle George is a 68 y.o. male who presents via video conferencing for a telehealth visit today.    The patient does not have symptoms concerning for COVID-19 infection (fever, chills, cough, or new shortness of breath).   Reports his blood pressure last week was 112/60's   Diabetes  He presents for his follow-up diabetic visit. He has type 2 diabetes mellitus. His disease course has been stable. There are no hypoglycemic associated symptoms. Pertinent negatives for hypoglycemia include no dizziness. There are no diabetic associated symptoms. Pertinent negatives for diabetes include no chest pain. There are no hypoglycemic complications. Symptoms are stable. There are no diabetic complications. Risk factors for coronary artery disease include hypertension and sedentary lifestyle. Current diabetic treatment includes oral agent (monotherapy). He is compliant with treatment all of the time. Eye exam is not current.  Hypertension  This is a chronic problem. The current episode started more than 1 year ago. The problem is controlled. Pertinent negatives include no anxiety, chest pain or palpitations. There are no  associated agents to hypertension. Risk factors for coronary artery disease include diabetes mellitus, male gender, obesity, sedentary lifestyle and smoking/tobacco exposure. Past treatments include angiotensin blockers, diuretics and calcium channel blockers. There are no compliance problems.  There is no history of angina. There is no history of chronic renal disease.     Past Medical History:  Diagnosis Date  . Cancer of prostate ([place])   . Diabetes mellitus without complication ([place])   . Hyperlipidemia   . Hypertension    Past Surgical History:  Procedure Laterality Date  . HEMORRHOID SURGERY       Current Meds  Medication Sig  . aspirin 81 MG tablet Take 81 mg by mouth daily.  . Blood Pressure Monitoring (BLOOD PRESSURE CUFF) MISC by Does not apply route. Check blood pressure at least 4 times per week 3 hours after taking medication  . Calcium Carbonate-Vitamin D (CALCIUM 600+D PO) Take 1 tablet by mouth daily.  . carvedilol (COREG) 6.25 MG tablet TAKE 1 TABLET BY MOUTH TWICE A DAY WITH FOOD  . cholecalciferol (VITAMIN D) 1000 UNITS tablet Take 5,000 Units by mouth daily.  . Insulin Detemir (LEVEMIR FLEXTOUCH ) Inject into the skin.  . Insulin Syringe-Needle U-100 (BD INSULIN SYRINGE U/F) 31G X 5/16" 0.3 ML MISC by Does not apply route. Use as directed with levemir pen  . JANUVIA 100 MG tablet TAKE 1 TABLET BY MOUTH EVERY DAY  . Multiple Vitamin (MULTIVITAMIN WITH MINERALS) TABS Take 1 tablet by mouth daily.  . Olmesartan-amLODIPine-HCTZ 40-10-25 MG TABS TAKE 1 TABLET BY MOUTH EVERY DAY  . Omega-3 Fatty Acids (FISH OIL PO) Take 1 tablet by  mouth once.  . pioglitazone-metformin (ACTOPLUS MET) 15-850 MG tablet TAKE 1 TABLET BY MOUTH TWICE A DAY  . predniSONE (DELTASONE) 5 MG tablet TAKE 1 TABLET BY MOUTH EVERY DAY WITH BREAKFAST  . rosuvastatin (CRESTOR) 10 MG tablet TAKE 1 TABLET BY MOUTH EVERY DAY  . spironolactone (ALDACTONE) 25 MG tablet Take 1 tablet (25 mg total) by mouth  daily.  . varenicline (CHANTIX STARTING MONTH PAK) 0.5 MG X 11 & 1 MG X 42 tablet Take one 0.5 mg tablet by mouth once daily for 3 days, then increase to one 0.5 mg tablet twice daily for 4 days, then increase to one 1 mg tablet twice daily.  Marland Kitchen ZYTIGA 250 MG tablet TAKE 4 TABLETS (1,000 MG TOTAL) BY MOUTH DAILY. TAKE ON AN EMPTY STOMACH 1 HOUR BEFORE OR 2 HOURS AFTER A MEAL     Allergies:   Penicillins   Social History   Tobacco Use  . Smoking status: Current Some Day Smoker    Packs/day: 0.50    Years: 40.00    Pack years: 20.00    Types: Cigarettes  . Smokeless tobacco: Former Systems developer    Types: Chew    Quit date: 53  . Tobacco comment: patient would like to start chantix  Substance Use Topics  . Alcohol use: Yes    Comment: on special occassions  . Drug use: Never     Family Hx: The patient's family history includes Cancer in his father; Diabetes in his mother; Hypertension in his mother.  ROS:   Please see the history of present illness.    Review of Systems  Constitutional: Negative.   Respiratory: Negative.  Negative for cough and sputum production. Wheezing: lipid.   Cardiovascular: Negative.  Negative for chest pain, palpitations and leg swelling.  Neurological: Negative for dizziness and tingling.    All other systems reviewed and are negative.   Labs/Other Tests and Data Reviewed:    Recent Labs: 02/06/2018: ALT 12; BUN 20; Creatinine 1.06; Hemoglobin 13.8; Platelet Count 151; Potassium 3.7; Sodium 141   Recent Lipid Panel Lab Results  Component Value Date/Time   CHOL 96 (L) 11/07/2017 03:50 PM   TRIG 109 11/07/2017 03:50 PM   HDL 51 11/07/2017 03:50 PM   CHOLHDL 1.9 11/07/2017 03:50 PM   LDLCALC 23 11/07/2017 03:50 PM    Wt Readings from Last 3 Encounters:  05/16/18 205 lb (93 kg)  02/06/18 214 lb (97.1 kg)  11/07/17 208 lb 3.2 oz (94.4 kg)     Exam:    Vital Signs:  BP 117/62 Comment: pt from last week  Ht _0  (1.753 m)   Wt 205 lb (93 kg)    BMI 30.27 kg/m     Physical Exam  Constitutional: He is oriented to person, place, and time and well-developed, well-nourished, and in no distress.  Neurological: He is alert and oriented to person, place, and time.  Psychiatric: Mood, memory, affect and judgment normal.    ASSESSMENT & PLAN:    1. Essential hypertension  Chronic  Blood pressure is in good control  Continue with current medications  2. Type 2 diabetes mellitus without complication, without long-term current use of insulin ([facility])  Chronic  Will check Hgba1c  Continue current medications - Hemoglobin A1c; Future - CMP14 + Anion Gap; Future - Lipid Profile; Future  3. Mixed hyperlipidemia  Chronic, controlled  Continue with current medications   COVID-19 Education: The signs and symptoms of COVID-19 were discussed with the patient and  how to seek care for testing (follow up with PCP or arrange E-visit).  The importance of social distancing was discussed today.  Patient Risk:   After full review of this patients clinical status, I feel that they are at least moderate risk at this time.  Time:   Today, I have spent 11 minutes/ seconds with the patient with telehealth technology discussing above diagnoses.     Medication Adjustments/Labs and Tests Ordered: Current medicines are reviewed at length with the patient today.  Concerns regarding medicines are outlined above.   Tests Ordered: No orders of the defined types were placed in this encounter.   Medication Changes: No orders of the defined types were placed in this encounter.   Disposition:  Follow up in 3 month(s)  Signed, [name], [name]

## 2018-05-20 ENCOUNTER — Other Ambulatory Visit: Payer: Self-pay | Admitting: Nurse Practitioner

## 2018-05-20 ENCOUNTER — Other Ambulatory Visit: Payer: Medicare Other

## 2018-05-20 ENCOUNTER — Other Ambulatory Visit: Payer: Self-pay

## 2018-05-21 LAB — CMP14 + ANION GAP
ALT: 9 IU/L (ref 0–44)
AST: 13 IU/L (ref 0–40)
Albumin/Globulin Ratio: 1.8 (ref 1.2–2.2)
Albumin: 4.2 g/dL (ref 3.8–4.8)
Alkaline Phosphatase: 63 IU/L (ref 39–117)
Anion Gap: 15 mmol/L (ref 10.0–18.0)
BUN/Creatinine Ratio: 15 (ref 10–24)
BUN: 13 mg/dL (ref 8–27)
Bilirubin Total: 0.3 mg/dL (ref 0.0–1.2)
CO2: 23 mmol/L (ref 20–29)
Calcium: 9.3 mg/dL (ref 8.6–10.2)
Chloride: 104 mmol/L (ref 96–106)
Creatinine, Ser: 0.84 mg/dL (ref 0.76–1.27)
GFR calc Af Amer: 103 mL/min/{1.73_m2} (ref >59)
GFR calc non Af Amer: 89 mL/min/{1.73_m2} (ref >59)
Globulin, Total: 2.4 g/dL (ref 1.5–4.5)
Glucose: 86 mg/dL (ref 65–99)
Potassium: 4 mmol/L (ref 3.5–5.2)
Sodium: 142 mmol/L (ref 134–144)
Total Protein: 6.6 g/dL (ref 6.0–8.5)

## 2018-05-21 LAB — LIPID PANEL
Chol/HDL Ratio: 1.8 ratio (ref 0.0–5.0)
Cholesterol, Total: 107 mg/dL (ref 100–199)
HDL: 59 mg/dL (ref >39)
LDL Calculated: 27 mg/dL (ref 0–99)
Triglycerides: 103 mg/dL (ref 0–149)
VLDL Cholesterol Cal: 21 mg/dL (ref 5–40)

## 2018-05-21 LAB — HEMOGLOBIN A1C
Est. average glucose Bld gHb Est-mCnc: 146 mg/dL
Hgb A1c MFr Bld: 6.7 % — ABNORMAL HIGH (ref 4.8–5.6)

## 2018-05-27 MED FILL — ZYTIGA 250 MG TABLET: 250 | 30 days supply | Qty: 120 | Fill #0

## 2018-06-05 ENCOUNTER — Encounter (HOSPITAL_COMMUNITY)
Admission: RE | Admit: 2018-06-05 | Discharge: 2018-06-05 | Disposition: A | Discharge status: Home / Self Care | Payer: Medicare Other | Source: Ambulatory Visit | Attending: Oncology | Admitting: Oncology

## 2018-06-05 ENCOUNTER — Inpatient Hospital Stay: Payer: Medicare Other | Attending: Oncology

## 2018-06-05 ENCOUNTER — Other Ambulatory Visit: Payer: Self-pay

## 2018-06-05 ENCOUNTER — Ambulatory Visit (HOSPITAL_COMMUNITY): Admission: RE | Admit: 2018-06-05 | Payer: Medicare Other | Source: Ambulatory Visit

## 2018-06-05 ENCOUNTER — Telehealth: Payer: Self-pay | Admitting: *Deleted

## 2018-06-05 ENCOUNTER — Ambulatory Visit (HOSPITAL_COMMUNITY)
Admission: RE | Admit: 2018-06-05 | Discharge: 2018-06-05 | Disposition: A | Discharge status: Home / Self Care | Payer: Medicare Other | Source: Ambulatory Visit | Attending: Oncology | Admitting: Oncology

## 2018-06-05 ENCOUNTER — Telehealth: Payer: Self-pay

## 2018-06-05 DIAGNOSIS — C61 Malignant neoplasm of prostate: Secondary | ICD-10-CM | POA: Diagnosis not present

## 2018-06-05 LAB — CMP (CANCER CENTER ONLY)
ALT: 11 U/L (ref 0–44)
AST: 12 U/L — ABNORMAL LOW (ref 15–41)
Albumin: 3.4 g/dL — ABNORMAL LOW (ref 3.5–5.0)
Alkaline Phosphatase: 71 U/L (ref 38–126)
Anion gap: 10 (ref 5–15)
BUN: 18 mg/dL (ref 8–23)
CO2: 25 mmol/L (ref 22–32)
Calcium: 9 mg/dL (ref 8.9–10.3)
Chloride: 105 mmol/L (ref 98–111)
Creatinine: 0.9 mg/dL (ref 0.61–1.24)
GFR, Est AFR Am: >60 mL/min (ref >60)
GFR, Est Non Af Am: >60 mL/min (ref >60)
Glucose, Bld: 133 mg/dL — ABNORMAL HIGH (ref 70–99)
Potassium: 3.7 mmol/L (ref 3.5–5.1)
Sodium: 140 mmol/L (ref 135–145)
Total Bilirubin: 0.4 mg/dL (ref 0.3–1.2)
Total Protein: 7.3 g/dL (ref 6.5–8.1)

## 2018-06-05 LAB — CBC WITH DIFFERENTIAL (CANCER CENTER ONLY)
Abs Immature Granulocytes: 0.02 10*3/uL (ref 0.00–0.07)
Basophils Absolute: 0 10*3/uL (ref 0.0–0.1)
Basophils Relative: 1 %
Eosinophils Absolute: 0.3 10*3/uL (ref 0.0–0.5)
Eosinophils Relative: 3 %
HCT: 41 % (ref 39.0–52.0)
Hemoglobin: 13 g/dL (ref 13.0–17.0)
Immature Granulocytes: 0 %
Lymphocytes Relative: 13 %
Lymphs Abs: 1.1 10*3/uL (ref 0.7–4.0)
MCH: 33.3 pg (ref 26.0–34.0)
MCHC: 31.7 g/dL (ref 30.0–36.0)
MCV: 105.1 fL — ABNORMAL HIGH (ref 80.0–100.0)
Monocytes Absolute: 0.4 10*3/uL (ref 0.1–1.0)
Monocytes Relative: 5 %
Neutro Abs: 6.6 10*3/uL (ref 1.7–7.7)
Neutrophils Relative %: 78 %
Platelet Count: 159 10*3/uL (ref 150–400)
RBC: 3.9 MIL/uL — ABNORMAL LOW (ref 4.22–5.81)
RDW: 13.7 % (ref 11.5–15.5)
WBC Count: 8.3 10*3/uL (ref 4.0–10.5)
nRBC: 0 % (ref 0.0–0.2)

## 2018-06-05 MED ORDER — SODIUM CHLORIDE (PF) 0.9 % IJ SOLN
INTRAMUSCULAR | Status: AC
  Filled 2018-06-05: qty 50

## 2018-06-05 MED ORDER — TECHNETIUM TC 99M MEDRONATE IV KIT
21.9000 | PACK | Freq: Once | INTRAVENOUS | Status: AC | PRN
  Administered 2018-06-05: 21.9 via INTRAVENOUS

## 2018-06-05 MED ORDER — IOHEXOL 300 MG/ML  SOLN
100.0000 mL | Freq: Once | INTRAMUSCULAR | Status: AC | PRN
  Administered 2018-06-05: 100 mL via INTRAVENOUS

## 2018-06-05 NOTE — Telephone Encounter (Signed)
"  [facility] Radiology [phone]).   Could you make sure Dr. Alen Blew see's Today's CT Abd/Pelvis results." Confirmed E.P.I.C results viewable as follows.  IMPRESSION: 1. Interval decrease in retroperitoneal and left pelvic sidewall lymphadenopathy. 2. Interval improvement in bony metastases. The more dominant bone metastases showed decrease in size with development of better defined sclerotic margins in the interval, suggesting a component of healing. 3. Interval development of irregular ill-defined eccentric focus of soft tissue wall thickening in the terminal ileum. This may be infectious/inflammatory, but neoplasm could have this appearance. GI consultation recommended. 4. Diffuse colonic diverticulosis with small focus of apparent edema/inflammation along the mid descending segment. Imaging features suspicious for diverticulitis without perforation or abscess. 5. Subtle asymmetric wall thickening in the right bladder, indeterminate by CT. Neoplasm not excluded. 6.  Aortic Atherosclerois (ICD10-170.0)

## 2018-06-05 NOTE — Telephone Encounter (Signed)
Received a call from Nuclear Medicine requesting that Dr. Alen Blew be notified that today's bone scan had to be canceled due to an infiltrated IV. Rescheduled for Friday 6/5 prior to visit with Dr. Alen Blew. Dr. Alen Blew aware.

## 2018-06-06 LAB — PROSTATE-SPECIFIC AG, SERUM (LABCORP): Prostate Specific Ag, Serum: 6.7 ng/mL — ABNORMAL HIGH (ref 0.0–4.0)

## 2018-06-06 LAB — TESTOSTERONE: Testosterone: <3 ng/dL — ABNORMAL LOW (ref 264–916)

## 2018-06-14 ENCOUNTER — Inpatient Hospital Stay: Payer: Medicare Other

## 2018-06-14 ENCOUNTER — Other Ambulatory Visit: Payer: Self-pay

## 2018-06-14 ENCOUNTER — Other Ambulatory Visit: Payer: Medicare Other

## 2018-06-14 ENCOUNTER — Encounter (HOSPITAL_COMMUNITY): Payer: Medicare Other

## 2018-06-14 ENCOUNTER — Other Ambulatory Visit: Payer: Self-pay | Admitting: Nurse Practitioner

## 2018-06-14 ENCOUNTER — Ambulatory Visit (HOSPITAL_COMMUNITY): Payer: Medicare Other

## 2018-06-14 ENCOUNTER — Ambulatory Visit (HOSPITAL_COMMUNITY)
Admission: RE | Admit: 2018-06-14 | Discharge: 2018-06-14 | Disposition: A | Discharge status: Home / Self Care | Payer: Medicare Other | Source: Ambulatory Visit | Attending: Oncology | Admitting: Oncology

## 2018-06-14 ENCOUNTER — Inpatient Hospital Stay: Payer: Medicare Other | Attending: Oncology | Admitting: Oncology

## 2018-06-14 ENCOUNTER — Telehealth: Payer: Self-pay | Admitting: *Deleted

## 2018-06-14 VITALS — BP 144/77 | HR 80 | Temp 97.7°F | Resp 17 | Ht 69.0 in | Wt 217.1 lb

## 2018-06-14 DIAGNOSIS — C7951 Secondary malignant neoplasm of bone: Secondary | ICD-10-CM | POA: Diagnosis not present

## 2018-06-14 DIAGNOSIS — Z191 Hormone sensitive malignancy status: Secondary | ICD-10-CM | POA: Insufficient documentation

## 2018-06-14 DIAGNOSIS — C61 Malignant neoplasm of prostate: Secondary | ICD-10-CM

## 2018-06-14 DIAGNOSIS — Z79818 Long term (current) use of other agents affecting estrogen receptors and estrogen levels: Secondary | ICD-10-CM | POA: Insufficient documentation

## 2018-06-14 DIAGNOSIS — R590 Localized enlarged lymph nodes: Secondary | ICD-10-CM | POA: Insufficient documentation

## 2018-06-14 DIAGNOSIS — K5730 Diverticulosis of large intestine without perforation or abscess without bleeding: Secondary | ICD-10-CM | POA: Insufficient documentation

## 2018-06-14 DIAGNOSIS — Z79899 Other long term (current) drug therapy: Secondary | ICD-10-CM | POA: Diagnosis not present

## 2018-06-14 DIAGNOSIS — I10 Essential (primary) hypertension: Secondary | ICD-10-CM | POA: Insufficient documentation

## 2018-06-14 DIAGNOSIS — E119 Type 2 diabetes mellitus without complications: Secondary | ICD-10-CM | POA: Diagnosis not present

## 2018-06-14 DIAGNOSIS — R9721 Rising PSA following treatment for malignant neoplasm of prostate: Secondary | ICD-10-CM | POA: Insufficient documentation

## 2018-06-14 DIAGNOSIS — Z7982 Long term (current) use of aspirin: Secondary | ICD-10-CM | POA: Diagnosis not present

## 2018-06-14 DIAGNOSIS — Z794 Long term (current) use of insulin: Secondary | ICD-10-CM | POA: Insufficient documentation

## 2018-06-14 MED ORDER — LEUPROLIDE ACETATE (4 MONTH) 30 MG IM KIT
30.0000 mg | PACK | Freq: Once | INTRAMUSCULAR | Status: AC
  Administered 2018-06-14: 30 mg via INTRAMUSCULAR
  Filled 2018-06-14: qty 30

## 2018-06-14 MED ORDER — TECHNETIUM TC 99M MEDRONATE IV KIT
20.0000 | PACK | Freq: Once | INTRAVENOUS | Status: AC | PRN
  Administered 2018-06-14: 20 via INTRAVENOUS

## 2018-06-14 NOTE — Progress Notes (Signed)
Hematology and Oncology Follow Up Visit  Kyle George [id number] 1950/12/28 68 y.o. 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  He was originally diagnosed with Gleason score 4+5 = 9  and PSA of 397.   Prior Therapy: Status post androgen deprivation and initially with [place] subsequently with Lupron with a PSA dropping down to 500 and December 2015.  He failed to follow-up with the PSA rising up to 1400 and April 2018.  Current therapy:  Lupron 30 mg injection every 4 months.     Zytiga 1000 mg daily with prednisone at 5 mg daily started in August 2018.  Interim History:  [name] is here for a repeat evaluation.  Since the last visit, he reports no major changes in his health.  He continues to tolerate Zytiga without any recent complaints.  He denied any nausea, fatigue or edema.  Continues to work full-time with excellent quality of life and tolerance.  He denies any worsening back pain or pathological fractures.  He denies recent hospitalization or illnesses.   He denied any alteration mental status, neuropathy, confusion or dizziness.  Denies any headaches or lethargy.  Denies any night sweats, weight loss or changes in appetite.  Denied orthopnea, dyspnea on exertion or chest discomfort.  Denies shortness of breath, difficulty breathing hemoptysis or cough.  Denies any abdominal distention, nausea, early satiety or dyspepsia.  Denies any hematuria, frequency, dysuria or nocturia.  Denies any skin irritation, dryness or rash.  Denies any ecchymosis or petechiae.  Denies any lymphadenopathy or clotting.  Denies any heat or cold intolerance.  Denies any anxiety or depression.  Remaining review of system is negative.    Medications: I have reviewed the patient's current medications.  Current Outpatient Medications  Medication  Sig Dispense Refill  . aspirin 81 MG tablet Take 81 mg by mouth daily.    . Blood Pressure Monitoring (BLOOD PRESSURE CUFF) MISC by Does not apply route. Check blood pressure at least 4 times per week 3 hours after taking medication    . Calcium Carbonate-Vitamin D (CALCIUM 600+D PO) Take 1 tablet by mouth daily.    . carvedilol (COREG) 6.25 MG tablet TAKE 1 TABLET BY MOUTH TWICE A DAY WITH FOOD 180 tablet 1  . cholecalciferol (VITAMIN D) 1000 UNITS tablet Take 5,000 Units by mouth daily.    Marland Kitchen glucose blood (ONETOUCH VERIO) test strip 1 each by Other route as needed for other. Use as instructed to check blood sugars    . Insulin Detemir (LEVEMIR FLEXTOUCH [place]) Inject into the skin.    . Insulin Syringe-Needle U-100 (BD INSULIN SYRINGE U/F) 31G X 5/16" 0.3 ML MISC by Does not apply route. Use as directed with levemir pen    . JANUVIA 100 MG tablet TAKE 1 TABLET BY MOUTH EVERY DAY 90 tablet 0  . Lancets (ONETOUCH DELICA PLUS JJHERD40C) MISC by Does not apply route. Use as directed to check blood  Sugars 2 times per day dx E11.65    . Multiple Vitamin (MULTIVITAMIN WITH MINERALS) TABS Take 1 tablet by mouth daily.    . NON FORMULARY Take 1 tablet by mouth daily. Thyroid  action    . Olmesartan-amLODIPine-HCTZ 40-10-25 MG TABS TAKE 1 TABLET BY MOUTH EVERY DAY 90 tablet 0  . Omega-3 Fatty Acids (FISH OIL PO) Take 1 tablet by mouth once.    . pioglitazone-metformin (ACTOPLUS  MET) 15-850 MG tablet TAKE 1 TABLET BY MOUTH TWICE A DAY 180 tablet 1  . predniSONE (DELTASONE) 5 MG tablet TAKE 1 TABLET BY MOUTH EVERY DAY WITH BREAKFAST 30 tablet 1  . rosuvastatin (CRESTOR) 10 MG tablet TAKE 1 TABLET BY MOUTH EVERY DAY 90 tablet 0  . spironolactone (ALDACTONE) 25 MG tablet Take 1 tablet (25 mg total) by mouth daily. 90 tablet 0  . varenicline (CHANTIX STARTING MONTH PAK) 0.5 MG X 11 & 1 MG X 42 tablet Take one 0.5 mg tablet by mouth once daily for 3 days, then increase to one 0.5 mg tablet twice daily for 4 days,  then increase to one 1 mg tablet twice daily. 53 tablet 0  . ZYTIGA 250 MG tablet TAKE 4 TABLETS (1,000 MG TOTAL) BY MOUTH DAILY. TAKE ON AN EMPTY STOMACH 1 HOUR BEFORE OR 2 HOURS AFTER A MEAL 120 tablet 0   No current facility-administered medications for this visit.      Allergies:  Allergies  Allergen Reactions  . Penicillins Nausea And Vomiting    Past Medical History, Surgical history, Social history, and Family History were reviewed and updated.   Physical Exam: Blood pressure (!) 144/77, pulse 80, temperature 97.7 F (36.5 C), temperature source Oral, resp. rate 17, height '5\' 9"'  (1.753 m), weight 217 lb 1.6 oz (98.5 kg), SpO2 100 %.     ECOG: 0   General appearance: Comfortable appearing without any discomfort Head: Normocephalic without any trauma Oropharynx: Mucous membranes are moist and pink without any thrush or ulcers. Eyes: Pupils are equal and round reactive to light. Lymph nodes: No cervical, supraclavicular, inguinal or axillary lymphadenopathy.   Heart:regular rate and rhythm.  S1 and S2 without leg edema. Lung: Clear without any rhonchi or wheezes.  No dullness to percussion. Abdomin: Soft, nontender, nondistended with good bowel sounds.  No hepatosplenomegaly. Musculoskeletal: No joint deformity or effusion.  Full range of motion noted. Neurological: No deficits noted on motor, sensory and deep tendon reflex exam. Skin: No petechial rash or dryness.  Appeared moist.  .     Lab Results: Lab Results  Component Value Date   WBC 8.3 06/05/2018   HGB 13.0 06/05/2018   HCT 41.0 06/05/2018   MCV 105.1 (H) 06/05/2018   PLT 159 06/05/2018              Results for Kyle George, Kyle George (MRN [id number]) as of 06/14/2018 13:37  Ref. Range 06/13/2017 10:14 10/10/2017 12:48 11/07/2017 15:50 02/06/2018 10:04 06/05/2018 09:00  Prostate Specific Ag, Serum Latest Ref Range: 0.0 - 4.0 ng/mL <0.1 0.4 1.0 2.9 6.7 (H)     IMPRESSION: 1. Interval decrease in  retroperitoneal and left pelvic sidewall lymphadenopathy. 2. Interval improvement in bony metastases. The more dominant bone metastases showed decrease in size with development of better defined sclerotic margins in the interval, suggesting a component of healing. 3. Interval development of irregular ill-defined eccentric focus of soft tissue wall thickening in the terminal ileum. This may be infectious/inflammatory, but neoplasm could have this appearance. GI consultation recommended. 4. Diffuse colonic diverticulosis with small focus of apparent edema/inflammation along the mid descending segment. Imaging features suspicious for diverticulitis without perforation or abscess. 5. Subtle asymmetric wall thickening in the right bladder, indeterminate by CT. Neoplasm not excluded. 6.  Aortic Atherosclerois (ICD10-170.0) Impression and Plan:   68 year old man with:  1.  Prostate cancer diagnosed in 2013 with advanced disease.  He is developing castration-resistant disease.   He has been on  Zytiga with excellent tolerance although his PSA has been on the rise.  His PSA on Jun 05, 2018 was 6.7 but he remains asymptomatic.  CT scan obtained on Jun 05, 2018 showed interval decrease in his retroperitoneal lymphadenopathy and improvement in his bony metastasis.  His bone scan is currently pending.  Risks and benefits of continuing this treatment versus different salvage therapy was discussed.  These options were also reiterated today including systemic chemotherapy and Xofigo.  I recommended continuing Zytiga for the time being and use Xofigo if he has worsening bony metastasis on his bone scan.  He is agreeable with this plan.    2. Androgen depravation: He is Lupron 30 mg every 4 months with next injection scheduled for 06/14/2018.  Risks and benefits of continuing this therapy was reviewed today.  Potential complications were also reiterated which include hot flashes, sexual dysfunction and  osteoporosis.  3. Diabetes: No exacerbation related to Zytiga and prednisone.  4. Bone health: I recommended continuing calcium and vitamin D supplements.  Delton See will be considered if he develops worsening bone disease and obtains dental clearance.  5.  Hypertension: Blood pressure remains under adequate control.  6.  Prognosis and goals of care: His disease is incurable and any therapy at this time remains palliative.  His performance status is excellent and aggressive therapy is warranted at this time.  7. Follow-up: Will be in 4 months for repeat evaluation.  25 minutes was spent with the patient face-to-face today.  More than 50% of time was dedicated to reviewing his imaging studies, disease status update, treatment options review and answering questions regarding future plan of care.    Zola Button, MD 6/5/20201:35 PM

## 2018-06-14 NOTE — Telephone Encounter (Signed)
Received call from [facility]/Nuclear Med stating that pt is having a Bone Scan today & injection is scheduled for 1250 pm & he will be late getting to appt with Dr Alen Blew.  Left message for Dr Hazeline Junker RN.

## 2018-06-18 ENCOUNTER — Other Ambulatory Visit: Payer: Self-pay | Admitting: Oncology

## 2018-06-18 DIAGNOSIS — C61 Malignant neoplasm of prostate: Secondary | ICD-10-CM

## 2018-06-20 MED FILL — ZYTIGA 250 MG TABLET: 250 | 30 days supply | Qty: 120 | Fill #0

## 2018-06-20 NOTE — Telephone Encounter (Signed)
Oral Oncology Patient Encounter:  Received fax from [facility] that patient's grant for Prostate Cancer has had no activity in 90 days. Kyle George will be canceled on 07/19/18. After looking at patient's claims in rx30, patient's deductible has been met and his insurance is now paying 100%.  9:50 AM Beatriz Chancellor, CPhT

## 2018-07-10 ENCOUNTER — Other Ambulatory Visit: Payer: Self-pay | Admitting: Oncology

## 2018-07-10 DIAGNOSIS — C61 Malignant neoplasm of prostate: Secondary | ICD-10-CM

## 2018-07-17 ENCOUNTER — Other Ambulatory Visit: Payer: Self-pay | Admitting: Nurse Practitioner

## 2018-07-17 DIAGNOSIS — C61 Malignant neoplasm of prostate: Secondary | ICD-10-CM

## 2018-07-26 ENCOUNTER — Telehealth: Payer: Self-pay

## 2018-07-26 NOTE — Telephone Encounter (Signed)
Oral Oncology [facility] Encounter  [facility] has attempted to reach the George 3 times to refill Zytiga with no success.  I attempted to reach the George to schedule his refill and had to leave a voicemail.  Kyle George [place] Phone [phone] Fax [phone] 07/26/2018   10:51 AM

## 2018-07-30 ENCOUNTER — Other Ambulatory Visit: Payer: Self-pay | Admitting: Internal Medicine

## 2018-07-30 DIAGNOSIS — E782 Mixed hyperlipidemia: Secondary | ICD-10-CM

## 2018-07-31 ENCOUNTER — Other Ambulatory Visit: Payer: Self-pay | Admitting: Oncology

## 2018-07-31 DIAGNOSIS — C61 Malignant neoplasm of prostate: Secondary | ICD-10-CM

## 2018-07-31 MED FILL — ZYTIGA 250 MG TABLET: 250 | 30 days supply | Qty: 120 | Fill #0

## 2018-08-12 ENCOUNTER — Other Ambulatory Visit: Payer: Self-pay | Admitting: Internal Medicine

## 2018-08-19 ENCOUNTER — Ambulatory Visit: Payer: Medicare Other | Admitting: Nurse Practitioner

## 2018-08-26 ENCOUNTER — Other Ambulatory Visit: Payer: Self-pay | Admitting: Oncology

## 2018-08-26 DIAGNOSIS — C61 Malignant neoplasm of prostate: Secondary | ICD-10-CM

## 2018-08-28 ENCOUNTER — Other Ambulatory Visit: Payer: Self-pay | Admitting: Internal Medicine

## 2018-08-28 ENCOUNTER — Telehealth: Payer: Self-pay

## 2018-08-28 MED FILL — ZYTIGA 250 MG TABLET: 250 | 30 days supply | Qty: 120 | Fill #0

## 2018-08-28 NOTE — Telephone Encounter (Signed)
LVM for pt to call to reschedule missed appt 08/28/2018

## 2018-09-02 ENCOUNTER — Other Ambulatory Visit: Payer: Self-pay | Admitting: Nurse Practitioner

## 2018-09-08 ENCOUNTER — Other Ambulatory Visit: Payer: Self-pay | Admitting: Nurse Practitioner

## 2018-09-10 ENCOUNTER — Other Ambulatory Visit: Payer: Self-pay | Admitting: Oncology

## 2018-09-10 DIAGNOSIS — C61 Malignant neoplasm of prostate: Secondary | ICD-10-CM

## 2018-09-25 ENCOUNTER — Other Ambulatory Visit: Payer: Self-pay | Admitting: Oncology

## 2018-09-25 DIAGNOSIS — C61 Malignant neoplasm of prostate: Secondary | ICD-10-CM

## 2018-09-30 MED FILL — ZYTIGA 250 MG TABLET: 250 | 30 days supply | Qty: 120 | Fill #0

## 2018-10-11 ENCOUNTER — Telehealth: Payer: Self-pay | Admitting: Oncology

## 2018-10-11 NOTE — Telephone Encounter (Signed)
Scheduled apt per 10/2 sch message - pt is aware of appt date and time 

## 2018-10-14 ENCOUNTER — Other Ambulatory Visit: Payer: Self-pay | Admitting: Nurse Practitioner

## 2018-10-14 DIAGNOSIS — C61 Malignant neoplasm of prostate: Secondary | ICD-10-CM

## 2018-10-15 ENCOUNTER — Inpatient Hospital Stay: Payer: Medicare Other | Admitting: Oncology

## 2018-10-15 ENCOUNTER — Other Ambulatory Visit: Payer: Self-pay

## 2018-10-15 ENCOUNTER — Inpatient Hospital Stay: Payer: Medicare Other

## 2018-10-15 ENCOUNTER — Inpatient Hospital Stay: Payer: Medicare Other | Attending: Oncology

## 2018-10-15 VITALS — BP 136/82 | HR 65 | Temp 98.5°F | Resp 18 | Ht 69.0 in | Wt 209.0 lb

## 2018-10-15 DIAGNOSIS — Z794 Long term (current) use of insulin: Secondary | ICD-10-CM | POA: Insufficient documentation

## 2018-10-15 DIAGNOSIS — Z79899 Other long term (current) drug therapy: Secondary | ICD-10-CM | POA: Diagnosis not present

## 2018-10-15 DIAGNOSIS — Z191 Hormone sensitive malignancy status: Secondary | ICD-10-CM | POA: Insufficient documentation

## 2018-10-15 DIAGNOSIS — C61 Malignant neoplasm of prostate: Secondary | ICD-10-CM | POA: Insufficient documentation

## 2018-10-15 DIAGNOSIS — Z79818 Long term (current) use of other agents affecting estrogen receptors and estrogen levels: Secondary | ICD-10-CM | POA: Diagnosis not present

## 2018-10-15 DIAGNOSIS — C7951 Secondary malignant neoplasm of bone: Secondary | ICD-10-CM | POA: Insufficient documentation

## 2018-10-15 DIAGNOSIS — I10 Essential (primary) hypertension: Secondary | ICD-10-CM | POA: Insufficient documentation

## 2018-10-15 DIAGNOSIS — E119 Type 2 diabetes mellitus without complications: Secondary | ICD-10-CM | POA: Diagnosis not present

## 2018-10-15 DIAGNOSIS — Z7982 Long term (current) use of aspirin: Secondary | ICD-10-CM | POA: Diagnosis not present

## 2018-10-15 LAB — CBC WITH DIFFERENTIAL (CANCER CENTER ONLY)
Abs Immature Granulocytes: 0.02 10*3/uL (ref 0.00–0.07)
Basophils Absolute: 0 10*3/uL (ref 0.0–0.1)
Basophils Relative: 1 %
Eosinophils Absolute: 0.2 10*3/uL (ref 0.0–0.5)
Eosinophils Relative: 2 %
HCT: 38.4 % — ABNORMAL LOW (ref 39.0–52.0)
Hemoglobin: 12.6 g/dL — ABNORMAL LOW (ref 13.0–17.0)
Immature Granulocytes: 0 %
Lymphocytes Relative: 14 %
Lymphs Abs: 1.1 10*3/uL (ref 0.7–4.0)
MCH: 34 pg (ref 26.0–34.0)
MCHC: 32.8 g/dL (ref 30.0–36.0)
MCV: 103.5 fL — ABNORMAL HIGH (ref 80.0–100.0)
Monocytes Absolute: 0.3 10*3/uL (ref 0.1–1.0)
Monocytes Relative: 4 %
Neutro Abs: 6.4 10*3/uL (ref 1.7–7.7)
Neutrophils Relative %: 79 %
Platelet Count: 162 10*3/uL (ref 150–400)
RBC: 3.71 MIL/uL — ABNORMAL LOW (ref 4.22–5.81)
RDW: 14.7 % (ref 11.5–15.5)
WBC Count: 8 10*3/uL (ref 4.0–10.5)
nRBC: 0 % (ref 0.0–0.2)

## 2018-10-15 LAB — CMP (CANCER CENTER ONLY)
ALT: 9 U/L (ref 0–44)
AST: 11 U/L — ABNORMAL LOW (ref 15–41)
Albumin: 3.6 g/dL (ref 3.5–5.0)
Alkaline Phosphatase: 65 U/L (ref 38–126)
Anion gap: 11 (ref 5–15)
BUN: 23 mg/dL (ref 8–23)
CO2: 24 mmol/L (ref 22–32)
Calcium: 9.3 mg/dL (ref 8.9–10.3)
Chloride: 107 mmol/L (ref 98–111)
Creatinine: 1.03 mg/dL (ref 0.61–1.24)
GFR, Est AFR Am: >60 mL/min (ref >60)
GFR, Estimated: >60 mL/min (ref >60)
Glucose, Bld: 107 mg/dL — ABNORMAL HIGH (ref 70–99)
Potassium: 3.8 mmol/L (ref 3.5–5.1)
Sodium: 142 mmol/L (ref 135–145)
Total Bilirubin: 0.3 mg/dL (ref 0.3–1.2)
Total Protein: 7 g/dL (ref 6.5–8.1)

## 2018-10-15 MED ORDER — LEUPROLIDE ACETATE (4 MONTH) 30 MG IM KIT: 30.0000 mg | PACK | Freq: Once | INTRAMUSCULAR | Status: DC

## 2018-10-15 MED ORDER — LEUPROLIDE ACETATE (4 MONTH) 30 MG ~~LOC~~ KIT
30.0000 mg | PACK | Freq: Once | SUBCUTANEOUS | Status: AC
  Administered 2018-10-15: 30 mg via SUBCUTANEOUS
  Filled 2018-10-15: qty 30

## 2018-10-15 NOTE — Progress Notes (Signed)
Hematology and Oncology Follow Up Visit  Kyle George [id number] 01/15/1950 68 y.o. 10/15/2018 9:57 AM [place] Chard, MDSanders, Bailey Mech, MD   Principle Diagnosis: 68 year old man with advanced prostate cancer with lymphadenopathy and bone disease diagnosed in 2013.  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   Prior Therapy: Status post androgen deprivation and initially with [place] subsequently with Lupron with a PSA dropping down to 500 and December 2015.  He failed to follow-up with the PSA rising up to 1400 and April 2018.  Current therapy:  Lupron 30 mg injection every 4 months.     Zytiga 1000 mg daily with prednisone at 5 mg daily started in August 2018.  Interim History:  [name] returns for repeat evaluation.  Since the last visit, he reports no major changes in his health.  He continues to tolerate Zytiga without any major complications.  He denies any nausea, vomiting or lower extremity edema.  He denies any bone pain or pathological fractures.  He continues to attend activities of daily living without any decline.  He continues to work as well without any decline in ability to do so.  Patient denied headaches, blurry vision, syncope or seizures.  Denies any fevers, chills or sweats.  Denied chest pain, palpitation, orthopnea or leg edema.  Denied cough, wheezing or hemoptysis.  Denied nausea, vomiting or abdominal pain.  Denies any constipation or diarrhea.  Denies any frequency urgency or hesitancy.  Denies any arthralgias or myalgias.  Denies any skin rashes or lesions.  Denies any bleeding or clotting tendency.  Denies any easy bruising.  Denies any hair or nail changes.  Denies any anxiety or depression.  Remaining review of system is negative.     Medications: Without any changes on review. Current Outpatient Medications  Medication Sig Dispense Refill  . aspirin 81 MG tablet Take 81 mg by mouth daily.    . B-D  ULTRAFINE III SHORT PEN 31G X 8 MM MISC USE AS DIRECTED WITH LEVEMIR PEN 100 each 5  . Blood Pressure Monitoring (BLOOD PRESSURE CUFF) MISC by Does not apply route. Check blood pressure at least 4 times per week 3 hours after taking medication    . Calcium Carbonate-Vitamin D (CALCIUM 600+D PO) Take 1 tablet by mouth daily.    . carvedilol (COREG) 6.25 MG tablet TAKE 1 TABLET BY MOUTH TWICE A DAY WITH FOOD 180 tablet 1  . cholecalciferol (VITAMIN D) 1000 UNITS tablet Take 5,000 Units by mouth daily.    Marland Kitchen glucose blood (ONETOUCH VERIO) test strip 1 each by Other route as needed for other. Use as instructed to check blood sugars    . Insulin Detemir (LEVEMIR FLEXTOUCH [place]) Inject into the skin.    . Insulin Syringe-Needle U-100 (BD INSULIN SYRINGE U/F) 31G X 5/16" 0.3 ML MISC by Does not apply route. Use as directed with levemir pen    . JANUVIA 100 MG tablet TAKE 1 TABLET BY MOUTH EVERY DAY 90 tablet 0  . Lancets (ONETOUCH DELICA PLUS [id number]) MISC by Does not apply route. Use as directed to check blood  Sugars 2 times per day dx E11.65    . LEVEMIR FLEXTOUCH 100 UNIT/ML Pen INJECT 10 UNITS BY SUBCUTANEOUS ROUTE EVERY BEDTIME 15 mL 2  . Multiple Vitamin (MULTIVITAMIN WITH MINERALS) TABS Take 1 tablet by mouth daily.    . NON FORMULARY Take 1 tablet by mouth daily. Thyroid  action    .  Olmesartan-amLODIPine-HCTZ 40-10-25 MG TABS TAKE 1 TABLET BY MOUTH EVERY DAY 90 tablet 0  . Omega-3 Fatty Acids (FISH OIL PO) Take 1 tablet by mouth once.    . pioglitazone-metformin (ACTOPLUS MET) 15-850 MG tablet TAKE 1 TABLET BY MOUTH TWICE A DAY 180 tablet 1  . predniSONE (DELTASONE) 5 MG tablet TAKE 1 TABLET BY MOUTH EVERY DAY WITH BREAKFAST 30 tablet 1  . rosuvastatin (CRESTOR) 10 MG tablet TAKE 1 TABLET BY MOUTH EVERY DAY 90 tablet 0  . spironolactone (ALDACTONE) 25 MG tablet TAKE 1 TABLET BY MOUTH EVERY DAY 90 tablet 0  . varenicline (CHANTIX STARTING MONTH PAK) 0.5 MG X 11 & 1 MG X 42 tablet Take one 0.5 mg  tablet by mouth once daily for 3 days, then increase to one 0.5 mg tablet twice daily for 4 days, then increase to one 1 mg tablet twice daily. 53 tablet 0  . ZYTIGA 250 MG tablet TAKE 4 TABLETS (1,000 MG TOTAL) BY MOUTH DAILY. TAKE ON AN EMPTY STOMACH 1 HOUR BEFORE OR 2 HOURS AFTER A MEAL 120 tablet 0   No current facility-administered medications for this visit.      Allergies:  Allergies  Allergen Reactions  . Penicillins Nausea And Vomiting    Past Medical History, Surgical history, Social history, and Family History unchanged on review.   Physical Exam:  Blood pressure 136/82, pulse 65, temperature 98.5 F (36.9 C), temperature source Temporal, resp. rate 18, height _0  (1.753 m), weight 209 lb (94.8 kg), SpO2 98 %.     ECOG: 0     General appearance: Alert, awake without any distress. Head: Atraumatic without abnormalities Oropharynx: Without any thrush or ulcers. Eyes: No scleral icterus. Lymph nodes: No lymphadenopathy noted in the cervical, supraclavicular, or axillary nodes Heart:regular rate and rhythm, without any murmurs or gallops.   Lung: Clear to auscultation without any rhonchi, wheezes or dullness to percussion. Abdomin: Soft, nontender without any shifting dullness or ascites. Musculoskeletal: No clubbing or cyanosis. Neurological: No motor or sensory deficits. Skin: No rashes or lesions. Psychiatric: Mood and affect appeared normal.      Lab Results: Lab Results  Component Value Date   WBC 8.3 06/05/2018   HGB 13.0 06/05/2018   HCT 41.0 06/05/2018   MCV 105.1 (H) 06/05/2018   PLT 159 06/05/2018              Results for TRIGGER, FRASIER (MRN [id number]) as of 06/14/2018 13:37  Ref. Range 06/13/2017 10:14 10/10/2017 12:48 11/07/2017 15:50 02/06/2018 10:04 06/05/2018 09:00  Prostate Specific Ag, Serum Latest Ref Range: 0.0 - 4.0 ng/mL <0.1 0.4 1.0 2.9 6.7 (H)        68 year old man with:  1.  Advanced prostate cancer with  lymphadenopathy and bone disease diagnosed since 2013.  He has castration-sensitive disease.     He continues to tolerate Zytiga without any major complications.  His PSA has continued to rise with a doubling time of less than 3 months.  Imaging studies in May 2020 showed reasonable response to therapy without any radiographic evidence progression.  The natural course of this disease was reviewed at this time.  Different salvage options were reiterated including trial of Xtandi, Xofigo and systemic chemotherapy.  He is agreeable with this plan at this time.    2. Androgen depravation: He continues to tolerate androgen deprivation without any complications.  Long-term issues including hot flashes and osteoporosis were reiterated.  He will receive Eligard today and repeated in  4 months.  Is agreeable to continue.  3. Diabetes: His blood sugar continues to be under reasonable control without any exacerbation on Zytiga.  4. Bone health: He continues to be on calcium and vitamin D supplements.  Delton See will be added after obtaining dental clearance.  5.  Hypertension: His blood pressure continue to be monitored on Zytiga without any recent exacerbation.  His blood pressure is within normal range.  6.  Prognosis and goals of care: Therapy remains palliative for his incurable disease.  Aggressive measures remains warranted at this time.  7. Follow-up: In 4 months for repeat follow-up.  25 minutes was spent with the patient face-to-face today.  More than 50% of time was spent on reviewing his disease status, treatment options and addressing complications related to current and future therapies.    Zola Button, MD 10/6/20209:57 AM

## 2018-10-16 ENCOUNTER — Telehealth: Payer: Self-pay

## 2018-10-16 LAB — PROSTATE-SPECIFIC AG, SERUM (LABCORP): Prostate Specific Ag, Serum: 2.9 ng/mL (ref 0.0–4.0)

## 2018-10-16 NOTE — Telephone Encounter (Signed)
Contacted patient and left a message with PSA result.

## 2018-10-16 NOTE — Telephone Encounter (Signed)
-----   Message from Wyatt Portela, MD sent at 10/16/2018  8:10 AM EDT ----- Please let him know his PSA is down.

## 2018-10-28 ENCOUNTER — Other Ambulatory Visit: Payer: Self-pay | Admitting: Oncology

## 2018-10-28 DIAGNOSIS — C61 Malignant neoplasm of prostate: Secondary | ICD-10-CM

## 2018-10-30 ENCOUNTER — Other Ambulatory Visit: Payer: Self-pay | Admitting: Nurse Practitioner

## 2018-10-30 DIAGNOSIS — E782 Mixed hyperlipidemia: Secondary | ICD-10-CM

## 2018-10-30 MED FILL — ZYTIGA 250 MG TABLET: 250 | 30 days supply | Qty: 120 | Fill #0

## 2018-11-09 ENCOUNTER — Other Ambulatory Visit: Payer: Self-pay | Admitting: Internal Medicine

## 2018-11-11 ENCOUNTER — Other Ambulatory Visit: Payer: Self-pay | Admitting: Oncology

## 2018-11-11 DIAGNOSIS — C61 Malignant neoplasm of prostate: Secondary | ICD-10-CM

## 2018-11-13 ENCOUNTER — Ambulatory Visit: Payer: Medicare Other | Admitting: Nurse Practitioner

## 2018-11-13 ENCOUNTER — Ambulatory Visit: Payer: Medicare Other

## 2018-11-20 ENCOUNTER — Telehealth: Payer: Self-pay | Admitting: Internal Medicine

## 2018-11-20 NOTE — Telephone Encounter (Signed)
I called the patient to reschedule his AWV/OV that was missed last week.  He said that he didn't know anything about an appointment last week, but we rescheduled to 11/24.

## 2018-11-24 ENCOUNTER — Other Ambulatory Visit: Payer: Self-pay | Admitting: Internal Medicine

## 2018-11-26 ENCOUNTER — Other Ambulatory Visit: Payer: Self-pay | Admitting: Oncology

## 2018-11-26 DIAGNOSIS — C61 Malignant neoplasm of prostate: Secondary | ICD-10-CM

## 2018-11-26 MED FILL — ZYTIGA 250 MG TABLET: 250 | 30 days supply | Qty: 120 | Fill #0

## 2018-12-03 ENCOUNTER — Other Ambulatory Visit: Payer: Self-pay

## 2018-12-03 ENCOUNTER — Ambulatory Visit: Payer: Medicare Other

## 2018-12-03 ENCOUNTER — Ambulatory Visit (INDEPENDENT_AMBULATORY_CARE_PROVIDER_SITE_OTHER): Payer: Medicare Other | Admitting: Nurse Practitioner

## 2018-12-03 VITALS — BP 132/80 | HR 75 | Temp 97.8°F | Ht 67.0 in | Wt 210.0 lb

## 2018-12-03 DIAGNOSIS — Z0000 Encounter for general adult medical examination without abnormal findings: Secondary | ICD-10-CM

## 2018-12-03 DIAGNOSIS — Z1211 Encounter for screening for malignant neoplasm of colon: Secondary | ICD-10-CM

## 2018-12-03 DIAGNOSIS — Z23 Encounter for immunization: Secondary | ICD-10-CM | POA: Diagnosis not present

## 2018-12-03 DIAGNOSIS — C61 Malignant neoplasm of prostate: Secondary | ICD-10-CM

## 2018-12-03 DIAGNOSIS — Z716 Tobacco abuse counseling: Secondary | ICD-10-CM

## 2018-12-03 DIAGNOSIS — Z1159 Encounter for screening for other viral diseases: Secondary | ICD-10-CM

## 2018-12-03 DIAGNOSIS — I10 Essential (primary) hypertension: Secondary | ICD-10-CM | POA: Diagnosis not present

## 2018-12-03 DIAGNOSIS — E782 Mixed hyperlipidemia: Secondary | ICD-10-CM | POA: Diagnosis not present

## 2018-12-03 DIAGNOSIS — E119 Type 2 diabetes mellitus without complications: Secondary | ICD-10-CM

## 2018-12-03 LAB — POCT URINALYSIS DIPSTICK
Bilirubin, UA: NEGATIVE
Glucose, UA: NEGATIVE
Ketones, UA: NEGATIVE
Leukocytes, UA: NEGATIVE
Nitrite, UA: NEGATIVE
Protein, UA: NEGATIVE
Spec Grav, UA: 1.02 (ref 1.010–1.025)
Urobilinogen, UA: 0.2 E.U./dL
pH, UA: 5 (ref 5.0–8.0)

## 2018-12-03 LAB — POCT UA - MICROALBUMIN
Albumin/Creatinine Ratio, Urine, POC: <30
Creatinine, POC: 200 mg/dL
Microalbumin Ur, POC: 10 mg/L

## 2018-12-03 NOTE — Progress Notes (Signed)
Subjective:   Kyle George is a 68 y.o. male who presents for Medicare Annual/Subsequent preventive examination.  This visit occurred during the SARS-CoV-2 public health emergency.  Safety protocols were in place, including screening questions prior to the visit, additional usage of staff PPE, and extensive cleaning of exam room while observing appropriate contact time as indicated for disinfecting solutions.   Review of Systems:  n/a Cardiac Risk Factors include: advanced age (>42mn, >>4women);obesity (BMI >30kg/m2);male gender;hypertension;diabetes mellitus;dyslipidemia     Objective:    Vitals: BP 132/80 (BP Location: Left Arm, Patient Position: Sitting, Cuff Size: Normal)   Pulse 75   Temp 97.8 F (36.6 C) (Oral)   Ht _0  (1.702 m)   Wt 210 lb (95.3 kg)   SpO2 97%   BMI 32.89 kg/m   Body mass index is 32.89 kg/m.  Advanced Directives 12/03/2018 11/07/2017 06/01/2016 03/13/2014 11/16/2011  Does Patient Have a Medical Advance Directive? No No No No Patient does not have advance directive  Would patient like information on creating a medical advance directive? No - Patient declined Yes (MAU/Ambulatory/Procedural Areas - Information given) - No - patient declined information -  Pre-existing out of facility DNR order (yellow form or pink MOST form) - - - - No    Tobacco Social History   Tobacco Use  Smoking Status Current Some Day Smoker  . Packs/day: 0.25  . Years: 40.00  . Pack years: 10.00  . 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 Types: Chew  . Quit date: 141    Ready to quit: No Counseling given: Yes   Clinical Intake:  Pre-visit preparation completed: Yes  Pain : No/denies pain     Nutritional Status: BMI > 30  Obese Nutritional Risks: None Diabetes: Yes CBG done?: No Did pt. bring in CBG monitor from home?: No  How often do you need to have someone help you when you read instructions, pamphlets, or other written materials from  your doctor or pharmacy?: 1 - Never What is the last grade level you completed in school?: college  Interpreter Needed?: No  Information entered by :: NAllen LPN  Past Medical History:  Diagnosis Date  . Cancer of prostate ([place]   . Diabetes mellitus without complication ([place]   . Hyperlipidemia   . Hypertension    Past Surgical History:  Procedure Laterality Date  . HEMORRHOID SURGERY     Family History  Problem Relation Age of Onset  . Hypertension Mother   . Diabetes Mother   . Cancer Father    Social History   Socioeconomic History  . Marital status: Married    Spouse name: Not on file  . Number of children: Not on file  . Years of education: Not on file  . Highest education level: Not on file  Occupational History  . Not on file  Social Needs  . Financial resource strain: Not hard at all  . Food insecurity    Worry: Never true    Inability: Never true  . Transportation needs    Medical: No    Non-medical: No  Tobacco Use  . Smoking status: Current Some Day Smoker    Packs/day: 0.25    Years: 40.00    Pack years: 10.00    Types: Cigarettes  . Smokeless tobacco: Former [employer] developer   Types: Chew    Quit date: 1975  Substance and Sexual Activity  . Alcohol use: Yes    Comment: occassionally  .  Drug use: Never  . Sexual activity: Yes  Lifestyle  . Physical activity    Days per week: 0 days    Minutes per session: 0 min  . Stress: Not at all  Relationships  . Social Herbalist on phone: Not on file    Gets together: Not on file    Attends religious service: Not on file    Active member of club or organization: Not on file    Attends meetings of clubs or organizations: Not on file    Relationship status: Not on file  Other Topics Concern  . Not on file  Social History Narrative  . Not on file    Outpatient Encounter Medications as of 12/03/2018  Medication Sig  . aspirin 81 MG tablet Take 81 mg by mouth daily.  . B-D ULTRAFINE III SHORT  PEN 31G X 8 MM MISC USE AS DIRECTED WITH LEVEMIR PEN  . Blood Pressure Monitoring (BLOOD PRESSURE CUFF) MISC by Does not apply route. Check blood pressure at least 4 times per week 3 hours after taking medication  . Calcium Carbonate-Vitamin D (CALCIUM 600+D PO) Take 1 tablet by mouth daily.  . carvedilol (COREG) 6.25 MG tablet TAKE 1 TABLET BY MOUTH TWICE A DAY WITH FOOD  . cholecalciferol (VITAMIN D) 1000 UNITS tablet Take 5,000 Units by mouth daily.  Marland Kitchen glucose blood (ONETOUCH VERIO) test strip 1 each by Other route as needed for other. Use as instructed to check blood sugars  . Insulin Syringe-Needle U-100 (BD INSULIN SYRINGE U/F) 31G X 5/16" 0.3 ML MISC by Does not apply route. Use as directed with levemir pen  . JANUVIA 100 MG tablet TAKE 1 TABLET BY MOUTH EVERY DAY  . Lancets (ONETOUCH DELICA PLUS [id number]) MISC by Does not apply route. Use as directed to check blood  Sugars 2 times per day dx E11.65  . LEVEMIR FLEXTOUCH 100 UNIT/ML Pen INJECT 10 UNITS BY SUBCUTANEOUS ROUTE EVERY BEDTIME  . Multiple Vitamin (MULTIVITAMIN WITH MINERALS) TABS Take 1 tablet by mouth daily.  . Olmesartan-amLODIPine-HCTZ 40-10-25 MG TABS TAKE 1 TABLET BY MOUTH EVERY DAY  . Omega-3 Fatty Acids (FISH OIL PO) Take 1 tablet by mouth once.  . pioglitazone-metformin (ACTOPLUS MET) 15-850 MG tablet TAKE 1 TABLET BY MOUTH TWICE A DAY  . predniSONE (DELTASONE) 5 MG tablet TAKE 1 TABLET BY MOUTH EVERY DAY WITH BREAKFAST  . rosuvastatin (CRESTOR) 10 MG tablet TAKE 1 TABLET BY MOUTH EVERY DAY  . ZYTIGA 250 MG tablet TAKE 4 TABLETS (1,000 MG TOTAL) BY MOUTH DAILY. TAKE ON AN EMPTY STOMACH 1 HOUR BEFORE OR 2 HOURS AFTER A MEAL  . Insulin Detemir (LEVEMIR FLEXTOUCH [place]) Inject into the skin.  . NON FORMULARY Take 1 tablet by mouth daily. Thyroid  action  . spironolactone (ALDACTONE) 25 MG tablet TAKE 1 TABLET BY MOUTH EVERY DAY (Patient not taking: Reported on 12/03/2018)  . varenicline (CHANTIX STARTING MONTH PAK) 0.5 MG X 11  & 1 MG X 42 tablet Take one 0.5 mg tablet by mouth once daily for 3 days, then increase to one 0.5 mg tablet twice daily for 4 days, then increase to one 1 mg tablet twice daily. (Patient not taking: Reported on 12/03/2018)   No facility-administered encounter medications on file as of 12/03/2018.     Activities of Daily Living In your present state of health, do you have any difficulty performing the following activities: 12/03/2018  Hearing? N  Vision? N  Difficulty concentrating  or making decisions? N  Walking or climbing stairs? N  Dressing or bathing? N  Doing errands, shopping? N  Preparing Food and eating ? N  Using the Toilet? N  In the past six months, have you accidently leaked urine? N  Do you have problems with loss of bowel control? N  Managing your Medications? N  Managing your Finances? N  Housekeeping or managing your Housekeeping? N  Some recent data might be hidden    Patient Care Team: [name], [name] as PCP - General (General Practice)   Assessment:   This is a routine wellness examination for Kyle George.  Exercise Activities and Dietary recommendations Current Exercise Habits: The patient has a physically strenuous job, but has no regular exercise apart from work.  Goals    . Patient Stated     12/03/2018, to avoid covid    . Quit Smoking (pt-stated)     Would like to start chantix if possible       Fall Risk Fall Risk  12/03/2018 05/16/2018 11/07/2017 12/12/2013  Falls in the past year? 0 0 No No  Risk for fall due to : Medication side effect - Medication side effect -  Follow up Falls evaluation completed;Education provided;Falls prevention discussed - - -   Is the patient's home free of loose throw rugs in walkways, pet beds, electrical cords, etc?   yes      Grab bars in the bathroom? no      Handrails on the stairs?   yes      Adequate lighting?   yes  Timed Get Up and Go Performed: n/a  Depression Screen PHQ 2/9 Scores 12/03/2018 05/16/2018  11/07/2017 12/12/2013  PHQ - 2 Score 0 0 0 0  PHQ- 9 Score 0 - - -    Cognitive Function     6CIT Screen 12/03/2018 11/07/2017  What Year? 0 points 0 points  What month? 0 points 0 points  What time? 0 points 0 points  Count back from 20 0 points 0 points  Months in reverse 0 points 0 points  Repeat phrase 0 points 0 points  Total Score 0 0    Immunization History  Administered Date(s) Administered  . Influenza, High Dose Seasonal PF 12/03/2018    Qualifies for Shingles Vaccine? yes  Screening Tests Health Maintenance  Topic Date Due  . Hepatitis C Screening  November 22, 1950  . FOOT EXAM  03/09/1959  . OPHTHALMOLOGY EXAM  03/09/1959  . COLONOSCOPY  03/09/1999  . HEMOGLOBIN A1C  11/20/2018  . PNA vac Low Risk Adult (1 of 2 - PCV13) 12/03/2019 (Originally 03/08/2014)  . TETANUS/TDAP  04/26/2023  . INFLUENZA VACCINE  Completed   Cancer Screenings: Lung: Low Dose CT Chest recommended if Age 49-80 years, 30 pack-year currently smoking OR have quit w/in 15years. Patient does not qualify. Colorectal: referral sent  Additional Screenings:  Hepatitis C Screening:due      Plan:    Patient wants to avoid covid.  I have personally reviewed and noted the following in the patient's chart:   . Medical and social history . Use of alcohol, tobacco or illicit drugs  . Current medications and supplements . Functional ability and status . Nutritional status . Physical activity . Advanced directives . List of other physicians . Hospitalizations, surgeries, and ER visits in previous 12 months . Vitals . Screenings to include cognitive, depression, and falls . Referrals and appointments  In addition, I have reviewed and discussed with patient certain preventive  protocols, quality metrics, and best practice recommendations. A written personalized care plan for preventive services as well as general preventive health recommendations were provided to patient.     Kellie Simmering,  LPN  [id number]

## 2018-12-03 NOTE — Patient Instructions (Signed)
[name] , Thank you for taking time to come for your Medicare Wellness Visit. I appreciate your ongoing commitment to your health goals. Please review the following plan we discussed and let me know if I can assist you in the future.   Screening recommendations/referrals: Colonoscopy: referral sent Recommended yearly ophthalmology/optometry visit for glaucoma screening and checkup Recommended yearly dental visit for hygiene and checkup  Vaccinations: Influenza vaccine: today Pneumococcal vaccine: declined Tdap vaccine: 04/2013 Shingles vaccine: discussed    Advanced directives: Advance directive discussed with you today. I have provided a copy for you to complete at home and have notarized. Once this is complete please bring a copy in to our office so we can scan it into your chart.   Conditions/risks identified: obesity  Next appointment:   Preventive Care 69 Years and Older, Male Preventive care refers to lifestyle choices and visits with your health care provider that can promote health and wellness. What does preventive care include?  A yearly physical exam. This is also called an annual well check.  Dental exams once or twice a year.  Routine eye exams. Ask your health care provider how often you should have your eyes checked.  Personal lifestyle choices, including:  Daily care of your teeth and gums.  Regular physical activity.  Eating a healthy diet.  Avoiding tobacco and drug use.  Limiting alcohol use.  Practicing safe sex.  Taking low doses of aspirin every day.  Taking vitamin and mineral supplements as recommended by your health care provider. What happens during an annual well check? The services and screenings done by your health care provider during your annual well check will depend on your age, overall health, lifestyle risk factors, and family history of disease. Counseling  Your health care provider may ask you questions about your:  Alcohol  use.  Tobacco use.  Drug use.  Emotional well-being.  Home and relationship well-being.  Sexual activity.  Eating habits.  History of falls.  Memory and ability to understand (cognition).  Work and work Statistician. Screening  You may have the following tests or measurements:  Height, weight, and BMI.  Blood pressure.  Lipid and cholesterol levels. These may be checked every 5 years, or more frequently if you are over 91 years old.  Skin check.  Lung cancer screening. You may have this screening every year starting at age 64 if you have a 30-pack-year history of smoking and currently smoke or have quit within the past 15 years.  Fecal occult blood test (FOBT) of the stool. You may have this test every year starting at age 21.  Flexible sigmoidoscopy or colonoscopy. You may have a sigmoidoscopy every 5 years or a colonoscopy every 10 years starting at age 55.  Prostate cancer screening. Recommendations will vary depending on your family history and other risks.  Hepatitis C blood test.  Hepatitis B blood test.  Sexually transmitted disease (STD) testing.  Diabetes screening. This is done by checking your blood sugar (glucose) after you have not eaten for a while (fasting). You may have this done every 1-3 years.  Abdominal aortic aneurysm (AAA) screening. You may need this if you are a current or former smoker.  Osteoporosis. You may be screened starting at age 28 if you are at high risk. Talk with your health care provider about your test results, treatment options, and if necessary, the need for more tests. Vaccines  Your health care provider may recommend certain vaccines, such as:  Influenza vaccine.  This is recommended every year.  Tetanus, diphtheria, and acellular pertussis (Tdap, Td) vaccine. You may need a Td booster every 10 years.  Zoster vaccine. You may need this after age 50.  Pneumococcal 13-valent conjugate (PCV13) vaccine. One dose is  recommended after age 79.  Pneumococcal polysaccharide (PPSV23) vaccine. One dose is recommended after age 75. Talk to your health care provider about which screenings and vaccines you need and how often you need them. This information is not intended to replace advice given to you by your health care provider. Make sure you discuss any questions you have with your health care provider. Document Released: 01/22/2015 Document Revised: 09/15/2015 Document Reviewed: 10/27/2014 Elsevier Interactive Patient Education  2017 [employer] Prevention in the Home Falls can cause injuries. They can happen to people of all ages. There are many things you can do to make your home safe and to help prevent falls. What can I do on the outside of my home?  Regularly fix the edges of walkways and driveways and fix any cracks.  Remove anything that might make you trip as you walk through a door, such as a raised step or threshold.  Trim any bushes or trees on the path to your home.  Use bright outdoor lighting.  Clear any walking paths of anything that might make someone trip, such as rocks or tools.  Regularly check to see if handrails are loose or broken. Make sure that both sides of any steps have handrails.  Any raised decks and porches should have guardrails on the edges.  Have any leaves, snow, or ice cleared regularly.  Use sand or salt on walking paths during winter.  Clean up any spills in your garage right away. This includes oil or grease spills. What can I do in the bathroom?  Use night lights.  Install grab bars by the toilet and in the tub and shower. Do not use towel bars as grab bars.  Use non-skid mats or decals in the tub or shower.  If you need to sit down in the shower, use a plastic, non-slip stool.  Keep the floor dry. Clean up any water that spills on the floor as soon as it happens.  Remove soap buildup in the tub or shower regularly.  Attach bath mats  securely with double-sided non-slip rug tape.  Do not have throw rugs and other things on the floor that can make you trip. What can I do in the bedroom?  Use night lights.  Make sure that you have a light by your bed that is easy to reach.  Do not use any sheets or blankets that are too big for your bed. They should not hang down onto the floor.  Have a firm chair that has side arms. You can use this for support while you get dressed.  Do not have throw rugs and other things on the floor that can make you trip. What can I do in the kitchen?  Clean up any spills right away.  Avoid walking on wet floors.  Keep items that you use a lot in easy-to-reach places.  If you need to reach something above you, use a strong step stool that has a grab bar.  Keep electrical cords out of the way.  Do not use floor polish or wax that makes floors slippery. If you must use wax, use non-skid floor wax.  Do not have throw rugs and other things on the floor that can make  you trip. What can I do with my stairs?  Do not leave any items on the stairs.  Make sure that there are handrails on both sides of the stairs and use them. Fix handrails that are broken or loose. Make sure that handrails are as long as the stairways.  Check any carpeting to make sure that it is firmly attached to the stairs. Fix any carpet that is loose or worn.  Avoid having throw rugs at the top or bottom of the stairs. If you do have throw rugs, attach them to the floor with carpet tape.  Make sure that you have a light switch at the top of the stairs and the bottom of the stairs. If you do not have them, ask someone to add them for you. What else can I do to help prevent falls?  Wear shoes that:  Do not have high heels.  Have rubber bottoms.  Are comfortable and fit you well.  Are closed at the toe. Do not wear sandals.  If you use a stepladder:  Make sure that it is fully opened. Do not climb a closed  stepladder.  Make sure that both sides of the stepladder are locked into place.  Ask someone to hold it for you, if possible.  Clearly mark and make sure that you can see:  Any grab bars or handrails.  First and last steps.  Where the edge of each step is.  Use tools that help you move around (mobility aids) if they are needed. These include:  Canes.  Walkers.  Scooters.  Crutches.  Turn on the lights when you go into a dark area. Replace any light bulbs as soon as they burn out.  Set up your furniture so you have a clear path. Avoid moving your furniture around.  If any of your floors are uneven, fix them.  If there are any pets around you, be aware of where they are.  Review your medicines with your doctor. Some medicines can make you feel dizzy. This can increase your chance of falling. Ask your doctor what other things that you can do to help prevent falls. This information is not intended to replace advice given to you by your health care provider. Make sure you discuss any questions you have with your health care provider. Document Released: 10/22/2008 Document Revised: 06/03/2015 Document Reviewed: 01/30/2014 Elsevier Interactive Patient Education  2017 [employer].

## 2018-12-03 NOTE — Progress Notes (Signed)
Subjective:     Patient ID: Kyle George , male    DOB: 1950/04/03 , 68 y.o.   MRN: [id number]   Chief Complaint  Patient presents with  . Diabetes    HPI  He has seen Dr. Alen Blew 3 months ago, no changes.   Wt Readings from Last 3 Encounters: 12/03/18 : 210 lb (95.3 kg) 12/03/18 : 210 lb (95.3 kg) 10/15/18 : 209 lb (94.8 kg)   Hypertension This is a chronic problem. The current episode started more than 1 year ago. The problem is unchanged. The problem is controlled. Pertinent negatives include no chest pain, headaches, malaise/fatigue, palpitations or shortness of breath. There are no associated agents to hypertension. There are no known risk factors for coronary artery disease. Past treatments include ACE inhibitors. There are no compliance problems.  There is no history of kidney disease.  Diabetes He presents for his follow-up diabetic visit. He has type 2 diabetes mellitus. There are no hypoglycemic associated symptoms. Pertinent negatives for hypoglycemia include no dizziness or headaches. There are no diabetic associated symptoms. Pertinent negatives for diabetes include no chest pain, no polydipsia, no polyphagia and no polyuria. There are no hypoglycemic complications. There are no diabetic complications. Risk factors for coronary artery disease include male sex and sedentary lifestyle. Current diabetic treatment includes oral agent (dual therapy).     Past Medical History:  Diagnosis Date  . Cancer of prostate ([place])   . Diabetes mellitus without complication ([place])   . Hyperlipidemia   . Hypertension      Family History  Problem Relation Age of Onset  . Hypertension Mother   . Diabetes Mother   . Cancer Father      Current Outpatient Medications:  .  aspirin 81 MG tablet, Take 81 mg by mouth daily., Disp: , Rfl:  .  B-D ULTRAFINE III SHORT PEN 31G X 8 MM MISC, USE AS DIRECTED WITH LEVEMIR PEN, Disp: 100 each, Rfl: 5 .  Blood Pressure Monitoring (BLOOD PRESSURE  CUFF) MISC, by Does not apply route. Check blood pressure at least 4 times per week 3 hours after taking medication, Disp: , Rfl:  .  Calcium Carbonate-Vitamin D (CALCIUM 600+D PO), Take 1 tablet by mouth daily., Disp: , Rfl:  .  carvedilol (COREG) 6.25 MG tablet, TAKE 1 TABLET BY MOUTH TWICE A DAY WITH FOOD, Disp: 180 tablet, Rfl: 1 .  cholecalciferol (VITAMIN D) 1000 UNITS tablet, Take 5,000 Units by mouth daily., Disp: , Rfl:  .  glucose blood (ONETOUCH VERIO) test strip, 1 each by Other route as needed for other. Use as instructed to check blood sugars, Disp: , Rfl:  .  Insulin Detemir (LEVEMIR FLEXTOUCH [place]), Inject into the skin., Disp: , Rfl:  .  Insulin Syringe-Needle U-100 (BD INSULIN SYRINGE U/F) 31G X 5/16" 0.3 ML MISC, by Does not apply route. Use as directed with levemir pen, Disp: , Rfl:  .  JANUVIA 100 MG tablet, TAKE 1 TABLET BY MOUTH EVERY DAY, Disp: 90 tablet, Rfl: 0 .  Lancets (ONETOUCH DELICA PLUS [id number]) MISC, by Does not apply route. Use as directed to check blood  Sugars 2 times per day dx E11.65, Disp: , Rfl:  .  LEVEMIR FLEXTOUCH 100 UNIT/ML Pen, INJECT 10 UNITS BY SUBCUTANEOUS ROUTE EVERY BEDTIME, Disp: 15 mL, Rfl: 0 .  Multiple Vitamin (MULTIVITAMIN WITH MINERALS) TABS, Take 1 tablet by mouth daily., Disp: , Rfl:  .  NON FORMULARY, Take 1 tablet by mouth daily. Thyroid  action, Disp: , Rfl:  .  Olmesartan-amLODIPine-HCTZ 40-10-25 MG TABS, TAKE 1 TABLET BY MOUTH EVERY DAY, Disp: 90 tablet, Rfl: 0 .  Omega-3 Fatty Acids (FISH OIL PO), Take 1 tablet by mouth once., Disp: , Rfl:  .  pioglitazone-metformin (ACTOPLUS MET) 15-850 MG tablet, TAKE 1 TABLET BY MOUTH TWICE A DAY, Disp: 180 tablet, Rfl: 1 .  predniSONE (DELTASONE) 5 MG tablet, TAKE 1 TABLET BY MOUTH EVERY DAY WITH BREAKFAST, Disp: 30 tablet, Rfl: 1 .  rosuvastatin (CRESTOR) 10 MG tablet, TAKE 1 TABLET BY MOUTH EVERY DAY, Disp: 90 tablet, Rfl: 0 .  spironolactone (ALDACTONE) 25 MG tablet, TAKE 1 TABLET BY MOUTH EVERY  DAY (Patient not taking: Reported on 12/03/2018), Disp: 90 tablet, Rfl: 0 .  varenicline (CHANTIX STARTING MONTH PAK) 0.5 MG X 11 & 1 MG X 42 tablet, Take one 0.5 mg tablet by mouth once daily for 3 days, then increase to one 0.5 mg tablet twice daily for 4 days, then increase to one 1 mg tablet twice daily. (Patient not taking: Reported on 12/03/2018), Disp: 53 tablet, Rfl: 0 .  ZYTIGA 250 MG tablet, TAKE 4 TABLETS (1,000 MG TOTAL) BY MOUTH DAILY. TAKE ON AN EMPTY STOMACH 1 HOUR BEFORE OR 2 HOURS AFTER A MEAL, Disp: 120 tablet, Rfl: 0   Allergies  Allergen Reactions  . Penicillins Nausea And Vomiting     Review of Systems  Constitutional: Negative.  Negative for malaise/fatigue.  HENT: Negative.   Eyes: Negative.  Negative for photophobia and visual disturbance.  Respiratory: Negative.  Negative for shortness of breath.   Cardiovascular: Negative.  Negative for chest pain, palpitations and leg swelling.  Gastrointestinal: Negative.   Endocrine: Negative.  Negative for polydipsia, polyphagia and polyuria.  Musculoskeletal: Negative.   Skin: Negative.   Allergic/Immunologic: Negative.   Neurological: Negative.  Negative for dizziness and headaches.  Psychiatric/Behavioral: Negative.      Today's Vitals   12/03/18 1527  BP: 132/80  Pulse: 75  Temp: 97.8 F (36.6 C)  TempSrc: Oral  Weight: 210 lb (95.3 kg)  Height: '5\' 7"'  (1.702 m)   Body mass index is 32.89 kg/m.   Objective:  Physical Exam Constitutional:      Appearance: Normal appearance. He is well-developed. He is obese.  HENT:     Head: Normocephalic.  Neck:     Musculoskeletal: Normal range of motion and neck supple.  Cardiovascular:     Rate and Rhythm: Normal rate and regular rhythm.     Pulses: Normal pulses.     Heart sounds: Normal heart sounds. No murmur.  Pulmonary:     Effort: Pulmonary effort is normal.     Breath sounds: Normal breath sounds.  Abdominal:     General: Bowel sounds are normal.      Palpations: Abdomen is soft.  Musculoskeletal: Normal range of motion.  Skin:    General: Skin is warm and dry.     Capillary Refill: Capillary refill takes less than 2 seconds.  Neurological:     General: No focal deficit present.     Mental Status: He is alert and oriented to person, place, and time.  Psychiatric:        Mood and Affect: Mood normal.        Behavior: Behavior normal.        Thought Content: Thought content normal.        Judgment: Judgment normal.         Assessment And Plan:  1. Essential hypertension  Chronic, good control.    Continue with current medications  AWV done today with [facility] - Hemoglobin A1c - CMP14 + Anion Gap - Lipid Profile - CBC no Diff  2. Type 2 diabetes mellitus without complication, without long-term current use of insulin (HCC)  Chronic, controlled  Continue with current medications  Diabetic foot exam done - decreased sensation bilateral feet and thickened toenails - Lipid Profile - Ambulatory referral to Ophthalmology  3. Encounter for tobacco use cessation counseling He is no longer taking Chantix and is using a filter on the end of his cigarettes.    4. Encounter for hepatitis C screening test for low risk patient  - Hepatitis C antibody  5. Prostate cancer [facility])  Continue follow up with Dr. Alen Blew     [name], [name]

## 2018-12-04 ENCOUNTER — Telehealth: Payer: Self-pay

## 2018-12-04 LAB — CMP14+EGFR
ALT: 8 IU/L (ref 0–44)
AST: 11 IU/L (ref 0–40)
Albumin/Globulin Ratio: 1.4 (ref 1.2–2.2)
Albumin: 4.1 g/dL (ref 3.8–4.8)
Alkaline Phosphatase: 79 IU/L (ref 39–117)
BUN/Creatinine Ratio: 24 (ref 10–24)
BUN: 24 mg/dL (ref 8–27)
Bilirubin Total: <0.2 mg/dL (ref 0.0–1.2)
CO2: 24 mmol/L (ref 20–29)
Calcium: 9.2 mg/dL (ref 8.6–10.2)
Chloride: 102 mmol/L (ref 96–106)
Creatinine, Ser: 0.99 mg/dL (ref 0.76–1.27)
GFR calc Af Amer: 89 mL/min/{1.73_m2} (ref >59)
GFR calc non Af Amer: 77 mL/min/{1.73_m2} (ref >59)
Globulin, Total: 3 g/dL (ref 1.5–4.5)
Glucose: 79 mg/dL (ref 65–99)
Potassium: 3.8 mmol/L (ref 3.5–5.2)
Sodium: 142 mmol/L (ref 134–144)
Total Protein: 7.1 g/dL (ref 6.0–8.5)

## 2018-12-04 LAB — HEPATITIS C ANTIBODY: Hep C Virus Ab: <0.1 s/co ratio (ref 0.0–0.9)

## 2018-12-04 LAB — HEMOGLOBIN A1C
Est. average glucose Bld gHb Est-mCnc: 140 mg/dL
Hgb A1c MFr Bld: 6.5 % — ABNORMAL HIGH (ref 4.8–5.6)

## 2018-12-04 NOTE — Telephone Encounter (Signed)
[name], [name]  Candiss Norse T, CMA        Hepatitis C is negative. HgbA1c is down to 6.5 from 6.7 this is going in the right direction. Kidney and liver functions are normal.    LVM for pt to call for lab results

## 2018-12-09 ENCOUNTER — Encounter: Payer: Self-pay | Admitting: Nurse Practitioner

## 2018-12-13 ENCOUNTER — Ambulatory Visit: Payer: Medicare Other

## 2018-12-13 LAB — HM DIABETES EYE EXAM

## 2018-12-18 ENCOUNTER — Encounter: Payer: Self-pay | Admitting: Nurse Practitioner

## 2018-12-27 ENCOUNTER — Other Ambulatory Visit: Payer: Self-pay | Admitting: Nurse Practitioner

## 2018-12-27 ENCOUNTER — Other Ambulatory Visit: Payer: Self-pay | Admitting: Oncology

## 2018-12-27 DIAGNOSIS — C61 Malignant neoplasm of prostate: Secondary | ICD-10-CM

## 2019-01-01 MED FILL — ZYTIGA 250 MG TABLET: 250 | 30 days supply | Qty: 120 | Fill #0

## 2019-01-06 ENCOUNTER — Encounter: Payer: Self-pay | Admitting: Nurse Practitioner

## 2019-01-09 ENCOUNTER — Other Ambulatory Visit: Payer: Self-pay | Admitting: Nurse Practitioner

## 2019-01-09 DIAGNOSIS — C61 Malignant neoplasm of prostate: Secondary | ICD-10-CM

## 2019-01-13 ENCOUNTER — Other Ambulatory Visit: Payer: Self-pay | Admitting: Oncology

## 2019-01-13 DIAGNOSIS — C61 Malignant neoplasm of prostate: Secondary | ICD-10-CM

## 2019-01-22 ENCOUNTER — Other Ambulatory Visit: Payer: Self-pay | Admitting: Oncology

## 2019-01-22 DIAGNOSIS — C61 Malignant neoplasm of prostate: Secondary | ICD-10-CM

## 2019-01-27 ENCOUNTER — Telehealth: Payer: Self-pay

## 2019-01-27 MED FILL — ZYTIGA 250 MG TABLET: 250 | 30 days supply | Qty: 120 | Fill #0

## 2019-01-27 NOTE — Telephone Encounter (Signed)
Oral Oncology Patient Advocate Encounter  Was successful in securing patient a $8000 grant from [employer] to provide copayment coverage for Zytiga.  This will keep the out of pocket expense at $0.     [employer] ID: [id number]  I have spoken with the patient.   The billing information is as follows and has been shared with [place].    RxBin: [id number] PCN: [id number] Member ID: [id number] Group ID: [id number] Dates of Eligibility: 12/28/18 through 12/27/19  [facility] [place] Phone [phone] Fax [phone] 01/27/2019 8:57 AM

## 2019-02-03 ENCOUNTER — Other Ambulatory Visit: Payer: Self-pay | Admitting: Internal Medicine

## 2019-02-03 ENCOUNTER — Other Ambulatory Visit: Payer: Self-pay | Admitting: Nurse Practitioner

## 2019-02-03 DIAGNOSIS — E782 Mixed hyperlipidemia: Secondary | ICD-10-CM

## 2019-02-16 ENCOUNTER — Other Ambulatory Visit: Payer: Self-pay | Admitting: Nurse Practitioner

## 2019-02-18 ENCOUNTER — Inpatient Hospital Stay: Payer: Medicare PPO

## 2019-02-18 ENCOUNTER — Other Ambulatory Visit: Payer: Self-pay

## 2019-02-18 ENCOUNTER — Inpatient Hospital Stay: Payer: Medicare PPO | Attending: Oncology | Admitting: Oncology

## 2019-02-18 VITALS — BP 144/74 | HR 61 | Temp 98.0°F | Resp 18 | Wt 216.3 lb

## 2019-02-18 DIAGNOSIS — E119 Type 2 diabetes mellitus without complications: Secondary | ICD-10-CM | POA: Insufficient documentation

## 2019-02-18 DIAGNOSIS — C61 Malignant neoplasm of prostate: Secondary | ICD-10-CM | POA: Diagnosis not present

## 2019-02-18 DIAGNOSIS — C7951 Secondary malignant neoplasm of bone: Secondary | ICD-10-CM | POA: Insufficient documentation

## 2019-02-18 DIAGNOSIS — Z191 Hormone sensitive malignancy status: Secondary | ICD-10-CM | POA: Insufficient documentation

## 2019-02-18 DIAGNOSIS — Z79899 Other long term (current) drug therapy: Secondary | ICD-10-CM | POA: Insufficient documentation

## 2019-02-18 DIAGNOSIS — Z794 Long term (current) use of insulin: Secondary | ICD-10-CM | POA: Diagnosis not present

## 2019-02-18 DIAGNOSIS — I10 Essential (primary) hypertension: Secondary | ICD-10-CM | POA: Diagnosis not present

## 2019-02-18 DIAGNOSIS — Z79818 Long term (current) use of other agents affecting estrogen receptors and estrogen levels: Secondary | ICD-10-CM | POA: Insufficient documentation

## 2019-02-18 DIAGNOSIS — Z7982 Long term (current) use of aspirin: Secondary | ICD-10-CM | POA: Insufficient documentation

## 2019-02-18 LAB — CBC WITH DIFFERENTIAL (CANCER CENTER ONLY)
Abs Immature Granulocytes: 0.02 10*3/uL (ref 0.00–0.07)
Basophils Absolute: 0 10*3/uL (ref 0.0–0.1)
Basophils Relative: 1 %
Eosinophils Absolute: 0.1 10*3/uL (ref 0.0–0.5)
Eosinophils Relative: 2 %
HCT: 39.7 % (ref 39.0–52.0)
Hemoglobin: 13.1 g/dL (ref 13.0–17.0)
Immature Granulocytes: 0 %
Lymphocytes Relative: 13 %
Lymphs Abs: 1.1 10*3/uL (ref 0.7–4.0)
MCH: 34.2 pg — ABNORMAL HIGH (ref 26.0–34.0)
MCHC: 33 g/dL (ref 30.0–36.0)
MCV: 103.7 fL — ABNORMAL HIGH (ref 80.0–100.0)
Monocytes Absolute: 0.4 10*3/uL (ref 0.1–1.0)
Monocytes Relative: 4 %
Neutro Abs: 6.5 10*3/uL (ref 1.7–7.7)
Neutrophils Relative %: 80 %
Platelet Count: 150 10*3/uL (ref 150–400)
RBC: 3.83 MIL/uL — ABNORMAL LOW (ref 4.22–5.81)
RDW: 14 % (ref 11.5–15.5)
WBC Count: 8.1 10*3/uL (ref 4.0–10.5)
nRBC: 0 % (ref 0.0–0.2)

## 2019-02-18 LAB — CMP (CANCER CENTER ONLY)
ALT: 9 U/L (ref 0–44)
AST: 11 U/L — ABNORMAL LOW (ref 15–41)
Albumin: 3.6 g/dL (ref 3.5–5.0)
Alkaline Phosphatase: 73 U/L (ref 38–126)
Anion gap: 9 (ref 5–15)
BUN: 19 mg/dL (ref 8–23)
CO2: 27 mmol/L (ref 22–32)
Calcium: 9.1 mg/dL (ref 8.9–10.3)
Chloride: 106 mmol/L (ref 98–111)
Creatinine: 0.96 mg/dL (ref 0.61–1.24)
GFR, Est AFR Am: >60 mL/min (ref >60)
GFR, Estimated: >60 mL/min (ref >60)
Glucose, Bld: 131 mg/dL — ABNORMAL HIGH (ref 70–99)
Potassium: 3.9 mmol/L (ref 3.5–5.1)
Sodium: 142 mmol/L (ref 135–145)
Total Bilirubin: 0.4 mg/dL (ref 0.3–1.2)
Total Protein: 7.2 g/dL (ref 6.5–8.1)

## 2019-02-18 MED ORDER — LEUPROLIDE ACETATE (4 MONTH) 30 MG ~~LOC~~ KIT
30.0000 mg | PACK | Freq: Once | SUBCUTANEOUS | Status: AC
  Administered 2019-02-18: 30 mg via SUBCUTANEOUS
  Filled 2019-02-18: qty 30

## 2019-02-18 NOTE — Progress Notes (Signed)
Hematology and Oncology Follow Up Visit  Kyle George [id number] 11/13/1950 69 y.o. 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  He presented with Gleason score 4+5 = 9  and PSA of 397.   Prior Therapy: Status post androgen deprivation and initially with [place] subsequently with Lupron with a PSA dropping down to 500 and December 2015.  He failed to follow-up with the PSA rising up to 1400 and April 2018.  Current therapy:  Lupron 30 mg injection every 4 months.   He is currently on Eligard 30 mg received in October 2020.  Zytiga 1000 mg daily with prednisone at 5 mg daily started in August 2018.  Interim History:  Mr. George presents today for a follow-up.  Since her last visit, he reports no major changes in his health.  He has tolerated Zytiga without any major complaints.  His performance status and quality of life remain excellent.  Continues to work close to 30 hours/week without any difficulty doing so.  Denies any bone pain, leg edema or excessive fatigue.     Medications: Unchanged on review. Current Outpatient Medications  Medication Sig Dispense Refill  . aspirin 81 MG tablet Take 81 mg by mouth daily.    . B-D ULTRAFINE III SHORT PEN 31G X 8 MM MISC USE AS DIRECTED WITH LEVEMIR PEN 100 each 5  . Blood Pressure Monitoring (BLOOD PRESSURE CUFF) MISC by Does not apply route. Check blood pressure at least 4 times per week 3 hours after taking medication    . Calcium Carbonate-Vitamin D (CALCIUM 600+D PO) Take 1 tablet by mouth daily.    . carvedilol (COREG) 6.25 MG tablet TAKE 1 TABLET BY MOUTH TWICE A DAY WITH FOOD 180 tablet 1  . cholecalciferol (VITAMIN D) 1000 UNITS tablet Take 5,000 Units by mouth daily.    [place] Kitchen glucose blood (ONETOUCH VERIO) test strip 1 each by Other route as needed for other. Use as instructed to check  blood sugars    . Insulin Detemir (LEVEMIR FLEXTOUCH [place]) Inject into the skin.    . Insulin Syringe-Needle U-100 (BD INSULIN SYRINGE U/F) 31G X 5/16" 0.3 ML MISC by Does not apply route. Use as directed with levemir pen    . JANUVIA 100 MG tablet TAKE 1 TABLET BY MOUTH EVERY DAY 90 tablet 0  . Lancets (ONETOUCH DELICA PLUS [id number]) MISC by Does not apply route. Use as directed to check blood  Sugars 2 times per day dx E11.65    . LEVEMIR FLEXTOUCH 100 UNIT/ML Pen INJECT 10 UNITS BY SUBCUTANEOUS ROUTE EVERY BEDTIME 15 mL 0  . Multiple Vitamin (MULTIVITAMIN WITH MINERALS) TABS Take 1 tablet by mouth daily.    . NON FORMULARY Take 1 tablet by mouth daily. Thyroid  action    . Olmesartan-amLODIPine-HCTZ 40-10-25 MG TABS TAKE 1 TABLET BY MOUTH EVERY DAY 90 tablet 0  . Omega-3 Fatty Acids (FISH OIL PO) Take 1 tablet by mouth once.    . pioglitazone-metformin (ACTOPLUS MET) 15-850 MG tablet TAKE 1 TABLET BY MOUTH TWICE A DAY 180 tablet 1  . predniSONE (DELTASONE) 5 MG tablet TAKE 1 TABLET BY MOUTH EVERY DAY WITH BREAKFAST 30 tablet 1  . rosuvastatin (CRESTOR) 10 MG tablet TAKE 1 TABLET BY MOUTH EVERY DAY 90 tablet 0  . spironolactone (ALDACTONE) 25 MG tablet TAKE 1 TABLET BY MOUTH EVERY DAY 90 tablet 0  .  ZYTIGA 250 MG tablet TAKE 4 TABLETS (1,000 MG TOTAL) BY MOUTH DAILY. TAKE ON AN EMPTY STOMACH 1 HOUR BEFORE OR 2 HOURS AFTER A MEAL 120 tablet 0   No current facility-administered medications for this visit.     Allergies:  Allergies  Allergen Reactions  . Penicillins Nausea And Vomiting       Physical Exam:  Blood pressure (!) 144/74, pulse 61, temperature 98 F (36.7 C), temperature source Temporal, resp. rate 18, weight 216 lb 4.8 oz (98.1 kg), SpO2 100 %.      ECOG: 0   General appearance: Comfortable appearing without any discomfort Head: Normocephalic without any trauma Oropharynx: Mucous membranes are moist and pink without any thrush or ulcers. Eyes: Pupils are equal  and round reactive to light. Lymph nodes: No cervical, supraclavicular, inguinal or axillary lymphadenopathy.   Heart:regular rate and rhythm.  S1 and S2 without leg edema. Lung: Clear without any rhonchi or wheezes.  No dullness to percussion. Abdomin: Soft, nontender, nondistended with good bowel sounds.  No hepatosplenomegaly. Musculoskeletal: No joint deformity or effusion.  Full range of motion noted. Neurological: No deficits noted on motor, sensory and deep tendon reflex exam. Skin: No petechial rash or dryness.  Appeared moist.        Lab Results: Lab Results  Component Value Date   WBC 8.0 10/15/2018   HGB 12.6 (L) 10/15/2018   HCT 38.4 (L) 10/15/2018   MCV 103.5 (H) 10/15/2018   PLT 162 10/15/2018                Results for COPELAN, MAULTSBY (MRN [id number]) as of 02/18/2019 09:42  Ref. Range 06/05/2018 09:00 10/15/2018 09:42  Prostate Specific Ag, Serum Latest Ref Range: 0.0 - 4.0 ng/mL 6.7 (H) 35.65       69 year old man with:  1.  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    His PSA continues to be under reasonable control despite a recent rise indicating castration-resistant disease.  The natural course of this disease and alternative treatment options were discussed.  These would include systemic chemotherapy, Xofigo among others.  We have deferred that option for the time being given the stability of his PSA but he understands different therapy may be needed in the near future.  He is agreeable with this plan.     2. Androgen depravation: I recommended continuing this indefinitely.  He is currently on Eligard which she will receive on February 9 and repeated in 4 months.  Long-term complication occluding weight gain, osteoporosis as well as fatigue were reiterated.  3. Diabetes: No recent exacerbation noted.  Blood sugar is manageable.  4. Bone health: I recommended calcium and vitamin D supplements to  continue.  Delton See has been deferred based on his wishes and lack of dental clearance.  5.  Hypertension: Under reasonable control at this time we will continue to monitor on Zytiga.  6.  Prognosis and goals of care: Aggressive therapy is warranted given his excellent performance status at this time.  His disease is incurable at this time..  7. Follow-up: We will repeat evaluation in 4 months.  30 minutes was dedicated to this encounter.  Time was spent on reviewing laboratory data, disease status update as well as coordinating future plan of care.   Zola Button, MD 2/9/20219:41 AM

## 2019-02-18 NOTE — Patient Instructions (Signed)

## 2019-02-19 ENCOUNTER — Telehealth: Payer: Self-pay | Admitting: Oncology

## 2019-02-19 LAB — PROSTATE-SPECIFIC AG, SERUM (LABCORP): Prostate Specific Ag, Serum: 8.8 ng/mL — ABNORMAL HIGH (ref 0.0–4.0)

## 2019-02-19 NOTE — Telephone Encounter (Signed)
Scheduled appt per 2/9 los.  Sent a message to HIM pool to get a calendar mailed out. 

## 2019-02-25 ENCOUNTER — Other Ambulatory Visit: Payer: Self-pay | Admitting: Oncology

## 2019-02-25 DIAGNOSIS — C61 Malignant neoplasm of prostate: Secondary | ICD-10-CM

## 2019-02-26 MED FILL — ZYTIGA 250 MG TABLET: 250 | 30 days supply | Qty: 120 | Fill #0

## 2019-03-05 ENCOUNTER — Ambulatory Visit: Payer: Medicare PPO

## 2019-03-05 ENCOUNTER — Ambulatory Visit: Payer: Medicare PPO | Admitting: Nurse Practitioner

## 2019-03-05 ENCOUNTER — Telehealth: Payer: Self-pay

## 2019-03-05 NOTE — Telephone Encounter (Signed)
This nurse called patient in order to follow up on patient missing today's scheduled AWV. He states that he did not realize he had an appointment today. We rescheduled for a virtual appointment on 03/20/2019 at 3:00p

## 2019-03-14 ENCOUNTER — Other Ambulatory Visit: Payer: Self-pay | Admitting: Oncology

## 2019-03-14 DIAGNOSIS — C61 Malignant neoplasm of prostate: Secondary | ICD-10-CM

## 2019-03-20 ENCOUNTER — Other Ambulatory Visit: Payer: Self-pay

## 2019-03-20 ENCOUNTER — Ambulatory Visit (INDEPENDENT_AMBULATORY_CARE_PROVIDER_SITE_OTHER): Payer: Medicare PPO

## 2019-03-20 ENCOUNTER — Encounter: Payer: Self-pay | Admitting: Gastroenterology

## 2019-03-20 VITALS — BP 142/74 | Temp 98.5°F | Ht 69.0 in | Wt 208.0 lb

## 2019-03-20 DIAGNOSIS — Z0000 Encounter for general adult medical examination without abnormal findings: Secondary | ICD-10-CM | POA: Diagnosis not present

## 2019-03-20 DIAGNOSIS — Z1211 Encounter for screening for malignant neoplasm of colon: Secondary | ICD-10-CM | POA: Diagnosis not present

## 2019-03-20 NOTE — Progress Notes (Signed)
This visit type was conducted due to national recommendations for restrictions regarding the COVID-19 Pandemic (e.g. social distancing). This format is felt to be most appropriate for this patient at this time. All issues noted in this document were discussed and addressed. No physical exam was performed (except for noted visual exam findings with Video Visits). This patient, [name], has given permission to perform this visit via telephone. Vital signs may be absent or patient reported.  Patient location:  At home  Nurse location:  Office    Subjective:   Kyle George is a 69 y.o. male who presents for Medicare Annual/Subsequent preventive examination.  Review of Systems:  n/a Cardiac Risk Factors include: advanced age (>79mn, >>90women);hypertension;dyslipidemia;diabetes mellitus;male gender;obesity (BMI >30kg/m2)     Objective:    Vitals: BP (!) 142/74 Comment: per patient  Temp 98.5 F (36.9 C) Comment: per patient  Ht '5\' 9"'  (1.753 m) Comment: per patient  Wt 208 lb (94.3 kg) Comment: per patient  BMI 30.72 kg/m   Body mass index is 30.72 kg/m.  Advanced Directives 03/20/2019 02/18/2019 12/03/2018 11/07/2017 06/01/2016 03/13/2014 11/16/2011  Does Patient Have a Medical Advance Directive? No No No No No No Patient does not have advance directive  Would patient like information on creating a medical advance directive? Yes (MAU/Ambulatory/Procedural Areas - Information given) No - Patient declined No - Patient declined Yes (MAU/Ambulatory/Procedural Areas - Information given) - No - patient declined information -  Pre-existing out of facility DNR order (yellow form or pink MOST form) - - - - - - No    Tobacco Social History   Tobacco Use  Smoking Status Current Some Day Smoker  . Packs/day: 0.25  . Years: 40.00  . Pack years: 10.00  . 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 Types: Chew  . Quit date: 185    Ready to quit: Not Answered Counseling  given: Not Answered   Clinical Intake:  Pre-visit preparation completed: Yes  Pain : No/denies pain     Nutritional Status: BMI > 30  Obese Nutritional Risks: None Diabetes: Yes  How often do you need to have someone help you when you read instructions, pamphlets, or other written materials from your doctor or pharmacy?: 1 - Never What is the last grade level you completed in school?: BS degree  Interpreter Needed?: No  Information entered by :: NAllen LPN  Past Medical History:  Diagnosis Date  . Cancer of [place] ([facility]   . Diabetes mellitus without complication ([facility]   . Hyperlipidemia   . Hypertension    Past Surgical History:  Procedure Laterality Date  . HEMORRHOID SURGERY     Family History  Problem Relation Age of Onset  . Hypertension Mother   . Diabetes Mother   . Cancer Father    Social History   Socioeconomic History  . Marital status: Married    Spouse name: Not on file  . Number of children: Not on file  . Years of education: Not on file  . Highest education level: Not on file  Occupational History  . Not on file  Tobacco Use  . Smoking status: Current Some Day Smoker    Packs/day: 0.25    Years: 40.00    Pack years: 10.00    Types: Cigarettes  . Smokeless tobacco: Former USystems developer   Types: Chew    Quit date: 1975  Substance and Sexual Activity  . Alcohol use: Yes    Comment:  occassionally  . Drug use: Never  . Sexual activity: Yes  Other Topics Concern  . Not on file  Social History Narrative  . Not on file   Social Determinants of Health   Financial Resource Strain: Low Risk   . Difficulty of Paying Living Expenses: Not hard at all  Food Insecurity: No Food Insecurity  . Worried About Charity fundraiser in the Last Year: Never true  . Ran Out of Food in the Last Year: Never true  Transportation Needs: No Transportation Needs  . Lack of Transportation (Medical): No  . Lack of Transportation (Non-Medical): No  Physical Activity:  Inactive  . Days of Exercise per Week: 0 days  . Minutes of Exercise per Session: 0 min  Stress: No Stress Concern Present  . Feeling of Stress : Not at all  Social Connections:   . Frequency of Communication with Friends and Family:   . Frequency of Social Gatherings with Friends and Family:   . Attends Religious Services:   . Active Member of Clubs or Organizations:   . Attends Archivist Meetings:   Marland Kitchen Marital Status:     Outpatient Encounter Medications as of 03/20/2019  Medication Sig  . aspirin 81 MG tablet Take 81 mg by mouth daily.  . B-D ULTRAFINE III SHORT PEN 31G X 8 MM MISC USE AS DIRECTED WITH LEVEMIR PEN  . Blood Pressure Monitoring (BLOOD PRESSURE CUFF) MISC by Does not apply route. Check blood pressure at least 4 times per week 3 hours after taking medication  . Calcium Carbonate-Vitamin D (CALCIUM 600+D PO) Take 1 tablet by mouth daily.  . carvedilol (COREG) 6.25 MG tablet TAKE 1 TABLET BY MOUTH TWICE A DAY WITH FOOD  . cholecalciferol (VITAMIN D) 1000 UNITS tablet Take 5,000 Units by mouth daily.  Marland Kitchen glucose blood (ONETOUCH VERIO) test strip 1 each by Other route as needed for other. Use as instructed to check blood sugars  . Insulin Detemir (LEVEMIR FLEXTOUCH [place]) Inject into the skin.  . Insulin Syringe-Needle U-100 (BD INSULIN SYRINGE U/F) 31G X 5/16" 0.3 ML MISC by Does not apply route. Use as directed with levemir pen  . JANUVIA 100 MG tablet TAKE 1 TABLET BY MOUTH EVERY DAY  . Lancets (ONETOUCH DELICA PLUS [id number]) MISC by Does not apply route. Use as directed to check blood  Sugars 2 times per day dx E11.65  . LEVEMIR FLEXTOUCH 100 UNIT/ML Pen INJECT 10 UNITS BY SUBCUTANEOUS ROUTE EVERY BEDTIME  . Multiple Vitamin (MULTIVITAMIN WITH MINERALS) TABS Take 1 tablet by mouth daily.  . NON FORMULARY Take 1 tablet by mouth daily. Thyroid  action  . Olmesartan-amLODIPine-HCTZ 40-10-25 MG TABS TAKE 1 TABLET BY MOUTH EVERY DAY  . Omega-3 Fatty Acids (FISH OIL PO)  Take 1 tablet by mouth once.  . pioglitazone-metformin (ACTOPLUS MET) 15-850 MG tablet TAKE 1 TABLET BY MOUTH TWICE A DAY  . predniSONE (DELTASONE) 5 MG tablet TAKE 1 TABLET BY MOUTH EVERY DAY WITH BREAKFAST  . rosuvastatin (CRESTOR) 10 MG tablet TAKE 1 TABLET BY MOUTH EVERY DAY  . spironolactone (ALDACTONE) 25 MG tablet TAKE 1 TABLET BY MOUTH EVERY DAY  . ZYTIGA 250 MG tablet TAKE 4 TABLETS (1,000 MG TOTAL) BY MOUTH DAILY. TAKE ON AN EMPTY STOMACH 1 HOUR BEFORE OR 2 HOURS AFTER A MEAL   No facility-administered encounter medications on file as of 03/20/2019.    Activities of Daily Living In your present state of health, do you have any  difficulty performing the following activities: 03/20/2019 12/03/2018  Hearing? N N  Vision? N N  Difficulty concentrating or making decisions? N N  Walking or climbing stairs? N N  Dressing or bathing? N N  Doing errands, shopping? N N  Preparing Food and eating ? N N  Using the Toilet? N N  In the past six months, have you accidently leaked urine? Y N  Comment once -  Do you have problems with loss of bowel control? N N  Managing your Medications? N N  Managing your Finances? N N  Housekeeping or managing your Housekeeping? N N  Some recent data might be hidden    Patient Care Team: [name], [name] as PCP - General (General Practice)   Assessment:   This is a routine wellness examination for Kyle George.  Exercise Activities and Dietary recommendations Current Exercise Habits: The patient has a physically strenuous job, but has no regular exercise apart from work.  Goals    . Patient Stated     12/03/2018, to avoid covid    . Patient Stated     03/20/2019, wants to completely quit smoking and stay physically active    . Quit Smoking (pt-stated)     Would like to start chantix if possible       Fall Risk Fall Risk  03/20/2019 12/03/2018 05/16/2018 11/07/2017 12/12/2013  Falls in the past year? 1 0 0 No No  Comment tripped over computer cord  - - - -  Number falls in past yr: 0 - - - -  Injury with Fall? 0 - - - -  Risk for fall due to : Medication side effect Medication side effect - Medication side effect -  Follow up Falls evaluation completed;Education provided;Falls prevention discussed Falls evaluation completed;Education provided;Falls prevention discussed - - -   Is the patient's home free of loose throw rugs in walkways, pet beds, electrical cords, etc?   yes      Grab bars in the bathroom? yes      Handrails on the stairs?   yes      Adequate lighting?   yes  Timed Get Up and Go Performed: n/a  Depression Screen PHQ 2/9 Scores 03/20/2019 12/03/2018 05/16/2018 11/07/2017  PHQ - 2 Score 0 0 0 0  PHQ- 9 Score 0 0 - -    Cognitive Function     6CIT Screen 03/20/2019 12/03/2018 11/07/2017  What Year? 0 points 0 points 0 points  What month? 0 points 0 points 0 points  What time? 0 points 0 points 0 points  Count back from 20 0 points 0 points 0 points  Months in reverse 0 points 0 points 0 points  Repeat phrase 0 points 0 points 0 points  Total Score 0 0 0    Immunization History  Administered Date(s) Administered  . Influenza, High Dose Seasonal PF 12/03/2018    Qualifies for Shingles Vaccine? yes  Screening Tests Health Maintenance  Topic Date Due  . COLONOSCOPY  Never done  . PNA vac Low Risk Adult (1 of 2 - PCV13) 12/03/2019 (Originally 03/08/2014)  . HEMOGLOBIN A1C  06/02/2019  . FOOT EXAM  12/03/2019  . OPHTHALMOLOGY EXAM  12/13/2019  . TETANUS/TDAP  04/26/2023  . INFLUENZA VACCINE  Completed  . Hepatitis C Screening  Completed   Cancer Screenings: Lung: Low Dose CT Chest recommended if Age 29-80 years, 30 pack-year currently smoking OR have quit w/in 15years. Patient does not qualify. Colorectal: referred  Additional  Screenings:  Hepatitis C Screening:12/03/2018      Plan:    Patient wants to completely quit smoking and stay active.  I have personally reviewed and noted the following in  the patient's chart:   . Medical and social history . Use of alcohol, tobacco or illicit drugs  . Current medications and supplements . Functional ability and status . Nutritional status . Physical activity . Advanced directives . List of other physicians . Hospitalizations, surgeries, and ER visits in previous 12 months . Vitals . Screenings to include cognitive, depression, and falls . Referrals and appointments  In addition, I have reviewed and discussed with patient certain preventive protocols, quality metrics, and best practice recommendations. A written personalized care plan for preventive services as well as general preventive health recommendations were provided to patient.     Kellie Simmering, LPN  0/90/[phone]

## 2019-03-20 NOTE — Patient Instructions (Signed)
[name] , Thank you for taking time to come for your Medicare Wellness Visit. I appreciate your ongoing commitment to your health goals. Please review the following plan we discussed and let me know if I can assist you in the future.   Screening recommendations/referrals: Colonoscopy: referred Recommended yearly ophthalmology/optometry visit for glaucoma screening and checkup Recommended yearly dental visit for hygiene and checkup  Vaccinations: Influenza vaccine: 11/2018 Pneumococcal vaccine: declined Tdap vaccine: 04/2013 Shingles vaccine: discussed    Advanced directives: Advance directive discussed with you today. I have provided a copy for you to complete at home and have notarized. Once this is complete please bring a copy in to our office so we can scan it into your chart.  Conditions/risks identified: obesity  Next appointment: 12/09/2019 at 3:00  Preventive Care 65 Years and Older, Male Preventive care refers to lifestyle choices and visits with your health care provider that can promote health and wellness. What does preventive care include?  A yearly physical exam. This is also called an annual well check.  Dental exams once or twice a year.  Routine eye exams. Ask your health care provider how often you should have your eyes checked.  Personal lifestyle choices, including:  Daily care of your teeth and gums.  Regular physical activity.  Eating a healthy diet.  Avoiding tobacco and drug use.  Limiting alcohol use.  Practicing safe sex.  Taking low doses of aspirin every day.  Taking vitamin and mineral supplements as recommended by your health care provider. What happens during an annual well check? The services and screenings done by your health care provider during your annual well check will depend on your age, overall health, lifestyle risk factors, and family history of disease. Counseling  Your health care provider may ask you questions about  your:  Alcohol use.  Tobacco use.  Drug use.  Emotional well-being.  Home and relationship well-being.  Sexual activity.  Eating habits.  History of falls.  Memory and ability to understand (cognition).  Work and work Statistician. Screening  You may have the following tests or measurements:  Height, weight, and BMI.  Blood pressure.  Lipid and cholesterol levels. These may be checked every 5 years, or more frequently if you are over 23 years old.  Skin check.  Lung cancer screening. You may have this screening every year starting at age 38 if you have a 30-pack-year history of smoking and currently smoke or have quit within the past 15 years.  Fecal occult blood test (FOBT) of the stool. You may have this test every year starting at age 2.  Flexible sigmoidoscopy or colonoscopy. You may have a sigmoidoscopy every 5 years or a colonoscopy every 10 years starting at age 12.  Prostate cancer screening. Recommendations will vary depending on your family history and other risks.  Hepatitis C blood test.  Hepatitis B blood test.  Sexually transmitted disease (STD) testing.  Diabetes screening. This is done by checking your blood sugar (glucose) after you have not eaten for a while (fasting). You may have this done every 1-3 years.  Abdominal aortic aneurysm (AAA) screening. You may need this if you are a current or former smoker.  Osteoporosis. You may be screened starting at age 77 if you are at high risk. Talk with your health care provider about your test results, treatment options, and if necessary, the need for more tests. Vaccines  Your health care provider may recommend certain vaccines, such as:  Influenza vaccine.  This is recommended every year.  Tetanus, diphtheria, and acellular pertussis (Tdap, Td) vaccine. You may need a Td booster every 10 years.  Zoster vaccine. You may need this after age 71.  Pneumococcal 13-valent conjugate (PCV13) vaccine.  One dose is recommended after age 3.  Pneumococcal polysaccharide (PPSV23) vaccine. One dose is recommended after age 31. Talk to your health care provider about which screenings and vaccines you need and how often you need them. This information is not intended to replace advice given to you by your health care provider. Make sure you discuss any questions you have with your health care provider. Document Released: 01/22/2015 Document Revised: 09/15/2015 Document Reviewed: 10/27/2014 Elsevier Interactive Patient Education  2017 [place] Prevention in the Home Falls can cause injuries. They can happen to people of all ages. There are many things you can do to make your home safe and to help prevent falls. What can I do on the outside of my home?  Regularly fix the edges of walkways and driveways and fix any cracks.  Remove anything that might make you trip as you walk through a door, such as a raised step or threshold.  Trim any bushes or trees on the path to your home.  Use bright outdoor lighting.  Clear any walking paths of anything that might make someone trip, such as rocks or tools.  Regularly check to see if handrails are loose or broken. Make sure that both sides of any steps have handrails.  Any raised decks and porches should have guardrails on the edges.  Have any leaves, snow, or ice cleared regularly.  Use sand or salt on walking paths during winter.  Clean up any spills in your garage right away. This includes oil or grease spills. What can I do in the bathroom?  Use night lights.  Install grab bars by the toilet and in the tub and shower. Do not use towel bars as grab bars.  Use non-skid mats or decals in the tub or shower.  If you need to sit down in the shower, use a plastic, non-slip stool.  Keep the floor dry. Clean up any water that spills on the floor as soon as it happens.  Remove soap buildup in the tub or shower regularly.  Attach bath  mats securely with double-sided non-slip rug tape.  Do not have throw rugs and other things on the floor that can make you trip. What can I do in the bedroom?  Use night lights.  Make sure that you have a light by your bed that is easy to reach.  Do not use any sheets or blankets that are too big for your bed. They should not hang down onto the floor.  Have a firm chair that has side arms. You can use this for support while you get dressed.  Do not have throw rugs and other things on the floor that can make you trip. What can I do in the kitchen?  Clean up any spills right away.  Avoid walking on wet floors.  Keep items that you use a lot in easy-to-reach places.  If you need to reach something above you, use a strong step stool that has a grab bar.  Keep electrical cords out of the way.  Do not use floor polish or wax that makes floors slippery. If you must use wax, use non-skid floor wax.  Do not have throw rugs and other things on the floor that can make  you trip. What can I do with my stairs?  Do not leave any items on the stairs.  Make sure that there are handrails on both sides of the stairs and use them. Fix handrails that are broken or loose. Make sure that handrails are as long as the stairways.  Check any carpeting to make sure that it is firmly attached to the stairs. Fix any carpet that is loose or worn.  Avoid having throw rugs at the top or bottom of the stairs. If you do have throw rugs, attach them to the floor with carpet tape.  Make sure that you have a light switch at the top of the stairs and the bottom of the stairs. If you do not have them, ask someone to add them for you. What else can I do to help prevent falls?  Wear shoes that:  Do not have high heels.  Have rubber bottoms.  Are comfortable and fit you well.  Are closed at the toe. Do not wear sandals.  If you use a stepladder:  Make sure that it is fully opened. Do not climb a closed  stepladder.  Make sure that both sides of the stepladder are locked into place.  Ask someone to hold it for you, if possible.  Clearly mark and make sure that you can see:  Any grab bars or handrails.  First and last steps.  Where the edge of each step is.  Use tools that help you move around (mobility aids) if they are needed. These include:  Canes.  Walkers.  Scooters.  Crutches.  Turn on the lights when you go into a dark area. Replace any light bulbs as soon as they burn out.  Set up your furniture so you have a clear path. Avoid moving your furniture around.  If any of your floors are uneven, fix them.  If there are any pets around you, be aware of where they are.  Review your medicines with your doctor. Some medicines can make you feel dizzy. This can increase your chance of falling. Ask your doctor what other things that you can do to help prevent falls. This information is not intended to replace advice given to you by your health care provider. Make sure you discuss any questions you have with your health care provider. Document Released: 10/22/2008 Document Revised: 06/03/2015 Document Reviewed: 01/30/2014 Elsevier Interactive Patient Education  2017 [employer].

## 2019-03-26 ENCOUNTER — Telehealth: Payer: Self-pay

## 2019-03-26 NOTE — Telephone Encounter (Signed)
Oral Oncology Patient Advocate Encounter   Was successful in securing patient a $3500 grant from [facility] (PAF) to provide copayment coverage for Zytiga.  This will keep the out of pocket expense at $0.     I have spoken with the patient.    The billing information is as follows and has been shared with [facility]: [id number] PCN:  [id number] Member ID: [id number] Group ID: [id number] Dates of Eligibility: 03/26/19 through 03/25/20  [facility] [place] Phone [phone] Fax [phone] 03/26/2019 2:31 PM

## 2019-03-31 ENCOUNTER — Other Ambulatory Visit: Payer: Self-pay | Admitting: Oncology

## 2019-03-31 DIAGNOSIS — C61 Malignant neoplasm of prostate: Secondary | ICD-10-CM

## 2019-04-02 ENCOUNTER — Telehealth: Payer: Self-pay | Admitting: Pharmacist

## 2019-04-02 MED FILL — ZYTIGA 250 MG TABLET: 250 | 30 days supply | Qty: 120 | Fill #0

## 2019-04-02 NOTE — Telephone Encounter (Signed)
I agree. Thanks.

## 2019-04-02 NOTE — Telephone Encounter (Signed)
Pt contacted [facility] asking if it is ok for him to take the COVID vaccine while taking Zytiga. I encouraged pt to take the COVID vaccine & assured him that there are no interactions. Pt has an appointment this week for the vaccine.  Kennith Center, Pharm.D., CPP 04/02/2019@1 :29 PM

## 2019-04-28 ENCOUNTER — Other Ambulatory Visit: Payer: Self-pay | Admitting: Oncology

## 2019-04-28 DIAGNOSIS — C61 Malignant neoplasm of prostate: Secondary | ICD-10-CM

## 2019-04-29 MED FILL — ZYTIGA 250 MG TABLET: 250 | 30 days supply | Qty: 120 | Fill #0

## 2019-05-01 ENCOUNTER — Other Ambulatory Visit: Payer: Self-pay | Admitting: Nurse Practitioner

## 2019-05-01 DIAGNOSIS — E782 Mixed hyperlipidemia: Secondary | ICD-10-CM

## 2019-05-05 ENCOUNTER — Other Ambulatory Visit: Payer: Self-pay | Admitting: Nurse Practitioner

## 2019-05-11 ENCOUNTER — Other Ambulatory Visit: Payer: Self-pay | Admitting: Nurse Practitioner

## 2019-05-12 ENCOUNTER — Ambulatory Visit (AMBULATORY_SURGERY_CENTER): Payer: Self-pay | Admitting: *Deleted

## 2019-05-12 ENCOUNTER — Other Ambulatory Visit: Payer: Self-pay

## 2019-05-12 VITALS — Temp 97.6°F | Ht 69.0 in | Wt 205.0 lb

## 2019-05-12 DIAGNOSIS — Z1211 Encounter for screening for malignant neoplasm of colon: Secondary | ICD-10-CM

## 2019-05-12 NOTE — Progress Notes (Signed)

## 2019-05-13 ENCOUNTER — Other Ambulatory Visit (HOSPITAL_COMMUNITY)
Admission: RE | Admit: 2019-05-13 | Discharge: 2019-05-13 | Disposition: A | Discharge status: Home / Self Care | Payer: Medicare PPO | Source: Ambulatory Visit | Attending: Gastroenterology | Admitting: Gastroenterology

## 2019-05-15 ENCOUNTER — Other Ambulatory Visit: Payer: Self-pay | Admitting: Oncology

## 2019-05-15 DIAGNOSIS — C61 Malignant neoplasm of prostate: Secondary | ICD-10-CM

## 2019-05-16 ENCOUNTER — Encounter: Payer: Medicare PPO | Admitting: Gastroenterology

## 2019-05-17 ENCOUNTER — Other Ambulatory Visit: Payer: Self-pay | Admitting: Nurse Practitioner

## 2019-05-17 DIAGNOSIS — E782 Mixed hyperlipidemia: Secondary | ICD-10-CM

## 2019-05-21 ENCOUNTER — Encounter: Payer: Self-pay | Admitting: Gastroenterology

## 2019-05-23 ENCOUNTER — Encounter: Payer: Self-pay | Admitting: Gastroenterology

## 2019-05-23 ENCOUNTER — Ambulatory Visit (AMBULATORY_SURGERY_CENTER): Payer: Medicare PPO | Admitting: Gastroenterology

## 2019-05-23 ENCOUNTER — Other Ambulatory Visit: Payer: Self-pay

## 2019-05-23 VITALS — BP 130/82 | HR 61 | Temp 96.2°F | Resp 18 | Ht 67.0 in | Wt 205.0 lb

## 2019-05-23 DIAGNOSIS — D128 Benign neoplasm of rectum: Secondary | ICD-10-CM

## 2019-05-23 DIAGNOSIS — D123 Benign neoplasm of transverse colon: Secondary | ICD-10-CM | POA: Diagnosis not present

## 2019-05-23 DIAGNOSIS — D122 Benign neoplasm of ascending colon: Secondary | ICD-10-CM | POA: Diagnosis not present

## 2019-05-23 DIAGNOSIS — D120 Benign neoplasm of cecum: Secondary | ICD-10-CM | POA: Diagnosis not present

## 2019-05-23 DIAGNOSIS — Z1211 Encounter for screening for malignant neoplasm of colon: Secondary | ICD-10-CM | POA: Diagnosis not present

## 2019-05-23 MED ORDER — SODIUM CHLORIDE 0.9 % IV SOLN
500.0000 mL | Freq: Once | INTRAVENOUS | Status: DC
Start: 2019-05-23 — End: 2019-05-23

## 2019-05-23 NOTE — Patient Instructions (Signed)
Handouts given for polyps, diverticulosis and hemorrhoids.  Await pathology results.  YOU HAD AN ENDOSCOPIC PROCEDURE TODAY AT THE [facility]:   Refer to the procedure report that was given to you for any specific questions about what was found during the examination.  If the procedure report does not answer your questions, please call your gastroenterologist to clarify.  If you requested that your care partner not be given the details of your procedure findings, then the procedure report has been included in a sealed envelope for you to review at your convenience later.  YOU SHOULD EXPECT: Some feelings of bloating in the abdomen. Passage of more gas than usual.  Walking can help get rid of the air that was put into your GI tract during the procedure and reduce the bloating. If you had a lower endoscopy (such as a colonoscopy or flexible sigmoidoscopy) you may notice spotting of blood in your stool or on the toilet paper. If you underwent a bowel prep for your procedure, you may not have a normal bowel movement for a few days.  Please Note:  You might notice some irritation and congestion in your nose or some drainage.  This is from the oxygen used during your procedure.  There is no need for concern and it should clear up in a day or so.  SYMPTOMS TO REPORT IMMEDIATELY:   Following lower endoscopy (colonoscopy or flexible sigmoidoscopy):  Excessive amounts of blood in the stool  Significant tenderness or worsening of abdominal pains  Swelling of the abdomen that is new, acute  Fever of 100F or higher   For urgent or emergent issues, a gastroenterologist can be reached at any hour by calling [phone]. Do not use MyChart messaging for urgent concerns.    DIET:  We do recommend a small meal at first, but then you may proceed to your regular diet.  Drink plenty of fluids but you should avoid alcoholic beverages for 24 hours.  ACTIVITY:  You should plan to take it easy for  the rest of today and you should NOT DRIVE or use heavy machinery until tomorrow (because of the sedation medicines used during the test).    FOLLOW UP: Our staff will call the number listed on your records 48-72 hours following your procedure to check on you and address any questions or concerns that you may have regarding the information given to you following your procedure. If we do not reach you, we will leave a message.  We will attempt to reach you two times.  During this call, we will ask if you have developed any symptoms of COVID 19. If you develop any symptoms (ie: fever, flu-like symptoms, shortness of breath, cough etc.) before then, please call [phone].  If you test positive for Covid 19 in the 2 weeks post procedure, please call and report this information to us.    If any biopsies were taken you will be contacted by phone or by letter within the next 1-3 weeks.  Please call us at [phone] if you have not heard about the biopsies in 3 weeks.    SIGNATURES/CONFIDENTIALITY: You and/or your care partner have signed paperwork which will be entered into your electronic medical record.  These signatures attest to the fact that that the information above on your After Visit Summary has been reviewed and is understood.  Full responsibility of the confidentiality of this discharge information lies with you and/or your care-partner. 

## 2019-05-23 NOTE — Progress Notes (Signed)
CW- vitals JB- temp 

## 2019-05-23 NOTE — Progress Notes (Signed)
Called to room to assist during endoscopic procedure.  Patient ID and intended procedure confirmed with present staff. Received instructions for my participation in the procedure from the performing physician.  

## 2019-05-23 NOTE — Progress Notes (Signed)
Pt's states no medical or surgical changes since previsit or office visit. 

## 2019-05-23 NOTE — Progress Notes (Signed)
To PACU, VSS. Report to Rn.tb 

## 2019-05-26 ENCOUNTER — Other Ambulatory Visit: Payer: Self-pay | Admitting: Oncology

## 2019-05-26 DIAGNOSIS — C61 Malignant neoplasm of prostate: Secondary | ICD-10-CM

## 2019-05-27 ENCOUNTER — Telehealth: Payer: Self-pay | Admitting: *Deleted

## 2019-05-27 ENCOUNTER — Telehealth: Payer: Self-pay

## 2019-05-27 MED FILL — ZYTIGA 250 MG TABLET: 250 | 30 days supply | Qty: 120 | Fill #0

## 2019-05-27 NOTE — Telephone Encounter (Signed)
Left message on follow up call. 

## 2019-05-27 NOTE — Telephone Encounter (Signed)
  Follow up Call-  Call back number 05/23/2019  Post procedure Call Back phone  # [phone]  Permission to leave phone message Yes  Some recent data might be hidden    LMOM to call back with any questions or concerns.  Also, call back if patient has developed fever, respiratory issues or been dx with COVID or had any family members or close contacts diagnosed since her procedure.

## 2019-06-03 ENCOUNTER — Encounter: Payer: Self-pay | Admitting: Gastroenterology

## 2019-06-10 ENCOUNTER — Other Ambulatory Visit: Payer: Self-pay | Admitting: Nurse Practitioner

## 2019-06-10 DIAGNOSIS — C61 Malignant neoplasm of prostate: Secondary | ICD-10-CM

## 2019-06-18 ENCOUNTER — Other Ambulatory Visit: Payer: Self-pay

## 2019-06-18 ENCOUNTER — Inpatient Hospital Stay: Payer: Medicare PPO

## 2019-06-18 ENCOUNTER — Telehealth: Payer: Self-pay | Admitting: Oncology

## 2019-06-18 ENCOUNTER — Inpatient Hospital Stay: Payer: Medicare PPO | Attending: Oncology

## 2019-06-18 ENCOUNTER — Inpatient Hospital Stay: Payer: Medicare PPO | Admitting: Oncology

## 2019-06-18 VITALS — BP 178/94 | HR 70 | Temp 97.9°F | Resp 17 | Ht 67.0 in | Wt 211.4 lb

## 2019-06-18 DIAGNOSIS — C61 Malignant neoplasm of prostate: Secondary | ICD-10-CM

## 2019-06-18 DIAGNOSIS — I10 Essential (primary) hypertension: Secondary | ICD-10-CM | POA: Insufficient documentation

## 2019-06-18 DIAGNOSIS — E119 Type 2 diabetes mellitus without complications: Secondary | ICD-10-CM | POA: Diagnosis not present

## 2019-06-18 DIAGNOSIS — Z7982 Long term (current) use of aspirin: Secondary | ICD-10-CM | POA: Insufficient documentation

## 2019-06-18 DIAGNOSIS — C7951 Secondary malignant neoplasm of bone: Secondary | ICD-10-CM | POA: Insufficient documentation

## 2019-06-18 DIAGNOSIS — Z79899 Other long term (current) drug therapy: Secondary | ICD-10-CM | POA: Diagnosis not present

## 2019-06-18 LAB — CBC WITH DIFFERENTIAL (CANCER CENTER ONLY)
Abs Immature Granulocytes: 0.02 10*3/uL (ref 0.00–0.07)
Basophils Absolute: 0 10*3/uL (ref 0.0–0.1)
Basophils Relative: 0 %
Eosinophils Absolute: 0.2 10*3/uL (ref 0.0–0.5)
Eosinophils Relative: 3 %
HCT: 40.5 % (ref 39.0–52.0)
Hemoglobin: 13.2 g/dL (ref 13.0–17.0)
Immature Granulocytes: 0 %
Lymphocytes Relative: 13 %
Lymphs Abs: 1.3 10*3/uL (ref 0.7–4.0)
MCH: 34.3 pg — ABNORMAL HIGH (ref 26.0–34.0)
MCHC: 32.6 g/dL (ref 30.0–36.0)
MCV: 105.2 fL — ABNORMAL HIGH (ref 80.0–100.0)
Monocytes Absolute: 0.4 10*3/uL (ref 0.1–1.0)
Monocytes Relative: 5 %
Neutro Abs: 7.3 10*3/uL (ref 1.7–7.7)
Neutrophils Relative %: 79 %
Platelet Count: 160 10*3/uL (ref 150–400)
RBC: 3.85 MIL/uL — ABNORMAL LOW (ref 4.22–5.81)
RDW: 14.4 % (ref 11.5–15.5)
WBC Count: 9.3 10*3/uL (ref 4.0–10.5)
nRBC: 0 % (ref 0.0–0.2)

## 2019-06-18 LAB — CMP (CANCER CENTER ONLY)
ALT: 10 U/L (ref 0–44)
AST: 11 U/L — ABNORMAL LOW (ref 15–41)
Albumin: 3.5 g/dL (ref 3.5–5.0)
Alkaline Phosphatase: 70 U/L (ref 38–126)
Anion gap: 14 (ref 5–15)
BUN: 21 mg/dL (ref 8–23)
CO2: 24 mmol/L (ref 22–32)
Calcium: 9.3 mg/dL (ref 8.9–10.3)
Chloride: 107 mmol/L (ref 98–111)
Creatinine: 1.06 mg/dL (ref 0.61–1.24)
GFR, Est AFR Am: >60 mL/min (ref >60)
GFR, Estimated: >60 mL/min (ref >60)
Glucose, Bld: 112 mg/dL — ABNORMAL HIGH (ref 70–99)
Potassium: 3.8 mmol/L (ref 3.5–5.1)
Sodium: 145 mmol/L (ref 135–145)
Total Bilirubin: 0.3 mg/dL (ref 0.3–1.2)
Total Protein: 7.3 g/dL (ref 6.5–8.1)

## 2019-06-18 MED ORDER — LEUPROLIDE ACETATE (4 MONTH) 30 MG ~~LOC~~ KIT
30.0000 mg | PACK | Freq: Once | SUBCUTANEOUS | Status: AC
  Administered 2019-06-18: 30 mg via SUBCUTANEOUS

## 2019-06-18 MED ORDER — LEUPROLIDE ACETATE (4 MONTH) 30 MG ~~LOC~~ KIT
PACK | SUBCUTANEOUS | Status: AC
  Filled 2019-06-18: qty 30

## 2019-06-18 NOTE — Telephone Encounter (Signed)
Scheduled per 6/9 los. Printed avs and calendar for pt. Pt aware of appts.

## 2019-06-18 NOTE — Progress Notes (Signed)
Hematology and Oncology Follow Up Visit  Kyle George [id number] Apr 26, 1950 69 y.o. 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  Prior Therapy: Status post androgen deprivation and initially with [place] subsequently with Lupron with a PSA dropping down to 500 and December 2015.  He failed to follow-up with the PSA rising up to 1400 and April 2018.  Current therapy:  Lupron 30 mg injection every 4 months.   He is currently on Eligard 30 mg every 4 months.  Zytiga 1000 mg daily with prednisone at 5 mg daily started in August 2018.  Interim History:  [name] returns today for a repeat evaluation.  Since the last visit, he reports no major changes in his health.  He continues to tolerate Zytiga without any recent complaints.  He denies any nausea, vomiting, pain.  He denies any lower extremity edema.  He does check blood pressure periodically and has been close to normal range.  Denies any bone pain or pathological fractures.     Medications: Reviewed without changes. Current Outpatient Medications  Medication Sig Dispense Refill  . aspirin 81 MG tablet Take 81 mg by mouth daily.    . B-D ULTRAFINE III SHORT PEN 31G X 8 MM MISC USE AS DIRECTED WITH LEVEMIR PEN 100 each 5  . Blood Pressure Monitoring (BLOOD PRESSURE CUFF) MISC by Does not apply route. Check blood pressure at least 4 times per week 3 hours after taking medication    . Calcium Carbonate-Vitamin D (CALCIUM 600+D PO) Take 1 tablet by mouth daily.    . carvedilol (COREG) 6.25 MG tablet TAKE 1 TABLET BY MOUTH TWICE A DAY WITH FOOD 180 tablet 1  . cholecalciferol (VITAMIN D) 1000 UNITS tablet Take 5,000 Units by mouth daily.    Marland Kitchen glucose blood (ONETOUCH VERIO) test strip 1 each by  Other route as needed for other. Use as instructed to check blood sugars    . Insulin Detemir (LEVEMIR FLEXTOUCH [place]) Inject into the skin.    . Insulin Syringe-Needle U-100 (BD INSULIN SYRINGE U/F) 31G X 5/16" 0.3 ML MISC by Does not apply route. Use as directed with levemir pen    . JANUVIA 100 MG tablet TAKE 1 TABLET BY MOUTH EVERY DAY 90 tablet 0  . Lancets (ONETOUCH DELICA PLUS [id number]) MISC by Does not apply route. Use as directed to check blood  Sugars 2 times per day dx E11.65    . LEVEMIR FLEXTOUCH 100 UNIT/ML Pen INJECT 10 UNITS BY SUBCUTANEOUS ROUTE EVERY BEDTIME 15 mL 0  . Multiple Vitamin (MULTIVITAMIN WITH MINERALS) TABS Take 1 tablet by mouth daily.    . NON FORMULARY Take 1 tablet by mouth daily. Thyroid  action    . Olmesartan-amLODIPine-HCTZ 40-10-25 MG TABS TAKE 1 TABLET BY MOUTH EVERY DAY 90 tablet 0  . Omega-3 Fatty Acids (FISH OIL PO) Take 1 tablet by mouth once.    . pioglitazone-metformin (ACTOPLUS MET) 15-850 MG tablet TAKE 1 TABLET BY MOUTH TWICE A DAY 180 tablet 1  . predniSONE (DELTASONE) 5 MG tablet TAKE 1 TABLET BY MOUTH EVERY DAY WITH BREAKFAST 30 tablet 1  . rosuvastatin (CRESTOR) 10 MG tablet TAKE 1 TABLET BY MOUTH EVERY DAY 90 tablet 0  . spironolactone (ALDACTONE) 25 MG tablet TAKE 1  TABLET BY MOUTH EVERY DAY 90 tablet 0  . ZYTIGA 250 MG tablet TAKE 4 TABLETS (1,000 MG TOTAL) BY MOUTH DAILY. TAKE ON AN EMPTY STOMACH 1 HOUR BEFORE OR 2 HOURS AFTER A MEAL 120 tablet 0   No current facility-administered medications for this visit.     Allergies:  No Active Allergies     Physical Exam:   Blood pressure (!) 178/94, pulse 70, temperature 97.9 F (36.6 C), temperature source Temporal, resp. rate 17, height '5\' 7"'  (1.702 m), weight 211 lb 6.4 oz (95.9 kg), SpO2 97 %.     ECOG: 0    General appearance: Alert, awake without any distress. Head: Atraumatic without abnormalities Oropharynx: Without any thrush or ulcers. Eyes: No scleral icterus. Lymph  nodes: No lymphadenopathy noted in the cervical, supraclavicular, or axillary nodes Heart:regular rate and rhythm, without any murmurs or gallops.   Lung: Clear to auscultation without any rhonchi, wheezes or dullness to percussion. Abdomin: Soft, nontender without any shifting dullness or ascites. Musculoskeletal: No clubbing or cyanosis. Neurological: No motor or sensory deficits. Skin: No rashes or lesions.         Lab Results: Lab Results  Component Value Date   WBC 8.1 02/18/2019   HGB 13.1 02/18/2019   HCT 39.7 02/18/2019   MCV 103.7 (H) 02/18/2019   PLT 150 02/18/2019                  Results for Kyle George, Kyle George (MRN [id number]) as of 06/18/2019 09:27  Ref. Range 10/15/2018 09:42 02/18/2019 09:30  Prostate Specific Ag, Serum Latest Ref Range: 0.0 - 4.0 ng/mL 2.9 8.8 (H)      69 year old man with:  1.  Prostate cancer diagnosed in 2013 with advanced disease including bone and lymph node involvement.  He has castration-sensitive disease at this time.   He is currently on Zytiga which she has tolerated well without any major complaints.  His PSA has been slowly rising although he is asymptomatic.  The natural course of this disease and future treatment options were reiterated.  Different salvage treatment choices including systemic chemotherapy among others.  For the time being we will continue the same dose and schedule and monitor his PSA closely.  Repeat imaging studies for staging purposes will be done if his PSA status continues to rise.     2. Androgen depravation: He is currently on Eligard and he will receive injection today and repeated in 4 months.  Complications including weight gain, hot flashes among others were reiterated.  3. Diabetes: He does not report any recent exacerbations with blood pressure has been under control.  4. Bone health: Calcium and vitamin D supplements are recommended at this time.  Delton See has been withheld because of dental  issues.  5.  Hypertension: His blood pressure is elevated today but has been normal between visits.  I recommended monitoring at and recording ambulatory measurements between visits.  6.  Prognosis and goals of care: His disease is incurable but aggressive measures are warranted.  Treatment remains palliative however.  7. Follow-up: In 4 months for a repeat follow-up and Eligard injection.  30 minutes spent on this visit.  The time was dedicated to reviewing laboratory data, discussing treatment options and addressing complications related to cancer and cancer care.   Zola Button, MD 6/9/20219:26 AM

## 2019-06-19 LAB — PROSTATE-SPECIFIC AG, SERUM (LABCORP): Prostate Specific Ag, Serum: 8.5 ng/mL — ABNORMAL HIGH (ref 0.0–4.0)

## 2019-06-23 ENCOUNTER — Other Ambulatory Visit: Payer: Self-pay | Admitting: Nurse Practitioner

## 2019-06-23 DIAGNOSIS — C61 Malignant neoplasm of prostate: Secondary | ICD-10-CM

## 2019-06-25 ENCOUNTER — Other Ambulatory Visit: Payer: Self-pay | Admitting: Oncology

## 2019-06-25 DIAGNOSIS — C61 Malignant neoplasm of prostate: Secondary | ICD-10-CM

## 2019-06-26 MED FILL — ZYTIGA 250 MG TABLET: 250 | 30 days supply | Qty: 120 | Fill #0

## 2019-07-19 ENCOUNTER — Other Ambulatory Visit: Payer: Self-pay | Admitting: Oncology

## 2019-07-19 DIAGNOSIS — C61 Malignant neoplasm of prostate: Secondary | ICD-10-CM

## 2019-07-22 ENCOUNTER — Other Ambulatory Visit: Payer: Self-pay | Admitting: Oncology

## 2019-07-22 DIAGNOSIS — C61 Malignant neoplasm of prostate: Secondary | ICD-10-CM

## 2019-07-29 MED FILL — ZYTIGA 250 MG TABLET: 250 | 30 days supply | Qty: 120 | Fill #0

## 2019-08-02 ENCOUNTER — Other Ambulatory Visit: Payer: Self-pay | Admitting: Nurse Practitioner

## 2019-08-12 ENCOUNTER — Other Ambulatory Visit: Payer: Self-pay | Admitting: Nurse Practitioner

## 2019-08-12 DIAGNOSIS — E782 Mixed hyperlipidemia: Secondary | ICD-10-CM

## 2019-08-26 ENCOUNTER — Other Ambulatory Visit: Payer: Self-pay | Admitting: Oncology

## 2019-08-26 DIAGNOSIS — C61 Malignant neoplasm of prostate: Secondary | ICD-10-CM

## 2019-08-27 MED FILL — ZYTIGA 250 MG TABLET: 250 | 30 days supply | Qty: 120 | Fill #0

## 2019-09-03 ENCOUNTER — Other Ambulatory Visit: Payer: Self-pay | Admitting: Nurse Practitioner

## 2019-09-03 DIAGNOSIS — C61 Malignant neoplasm of prostate: Secondary | ICD-10-CM

## 2019-09-06 ENCOUNTER — Other Ambulatory Visit: Payer: Self-pay | Admitting: Nurse Practitioner

## 2019-09-06 DIAGNOSIS — E782 Mixed hyperlipidemia: Secondary | ICD-10-CM

## 2019-09-14 ENCOUNTER — Other Ambulatory Visit: Payer: Self-pay | Admitting: Nurse Practitioner

## 2019-09-14 DIAGNOSIS — C61 Malignant neoplasm of prostate: Secondary | ICD-10-CM

## 2019-09-17 ENCOUNTER — Other Ambulatory Visit: Payer: Self-pay | Admitting: Oncology

## 2019-09-17 DIAGNOSIS — C61 Malignant neoplasm of prostate: Secondary | ICD-10-CM

## 2019-09-18 ENCOUNTER — Other Ambulatory Visit: Payer: Self-pay | Admitting: Oncology

## 2019-09-18 DIAGNOSIS — C61 Malignant neoplasm of prostate: Secondary | ICD-10-CM

## 2019-09-23 MED FILL — ABIRATERONE ACETATE 250 MG: 250 | 30 days supply | Qty: 120 | Fill #0

## 2019-10-09 ENCOUNTER — Other Ambulatory Visit: Payer: Self-pay | Admitting: Nurse Practitioner

## 2019-10-09 DIAGNOSIS — E782 Mixed hyperlipidemia: Secondary | ICD-10-CM

## 2019-10-17 ENCOUNTER — Telehealth: Payer: Self-pay | Admitting: Oncology

## 2019-10-17 ENCOUNTER — Other Ambulatory Visit: Payer: Self-pay | Admitting: Oncology

## 2019-10-17 ENCOUNTER — Inpatient Hospital Stay: Payer: Medicare PPO | Attending: Oncology | Admitting: Oncology

## 2019-10-17 ENCOUNTER — Other Ambulatory Visit: Payer: Self-pay

## 2019-10-17 ENCOUNTER — Inpatient Hospital Stay: Payer: Medicare PPO

## 2019-10-17 VITALS — BP 145/77 | HR 62 | Temp 97.3°F | Resp 18 | Wt 203.9 lb

## 2019-10-17 DIAGNOSIS — Z7982 Long term (current) use of aspirin: Secondary | ICD-10-CM | POA: Diagnosis not present

## 2019-10-17 DIAGNOSIS — I10 Essential (primary) hypertension: Secondary | ICD-10-CM | POA: Insufficient documentation

## 2019-10-17 DIAGNOSIS — C61 Malignant neoplasm of prostate: Secondary | ICD-10-CM | POA: Diagnosis not present

## 2019-10-17 DIAGNOSIS — E119 Type 2 diabetes mellitus without complications: Secondary | ICD-10-CM | POA: Diagnosis not present

## 2019-10-17 DIAGNOSIS — C7951 Secondary malignant neoplasm of bone: Secondary | ICD-10-CM | POA: Diagnosis not present

## 2019-10-17 DIAGNOSIS — Z191 Hormone sensitive malignancy status: Secondary | ICD-10-CM | POA: Diagnosis not present

## 2019-10-17 DIAGNOSIS — Z7952 Long term (current) use of systemic steroids: Secondary | ICD-10-CM | POA: Insufficient documentation

## 2019-10-17 DIAGNOSIS — Z79899 Other long term (current) drug therapy: Secondary | ICD-10-CM | POA: Diagnosis not present

## 2019-10-17 DIAGNOSIS — Z79818 Long term (current) use of other agents affecting estrogen receptors and estrogen levels: Secondary | ICD-10-CM | POA: Insufficient documentation

## 2019-10-17 LAB — CBC WITH DIFFERENTIAL (CANCER CENTER ONLY)
Abs Immature Granulocytes: 0.03 10*3/uL (ref 0.00–0.07)
Basophils Absolute: 0 10*3/uL (ref 0.0–0.1)
Basophils Relative: 0 %
Eosinophils Absolute: 0.2 10*3/uL (ref 0.0–0.5)
Eosinophils Relative: 2 %
HCT: 37.2 % — ABNORMAL LOW (ref 39.0–52.0)
Hemoglobin: 12.3 g/dL — ABNORMAL LOW (ref 13.0–17.0)
Immature Granulocytes: 0 %
Lymphocytes Relative: 17 %
Lymphs Abs: 1.4 10*3/uL (ref 0.7–4.0)
MCH: 33.5 pg (ref 26.0–34.0)
MCHC: 33.1 g/dL (ref 30.0–36.0)
MCV: 101.4 fL — ABNORMAL HIGH (ref 80.0–100.0)
Monocytes Absolute: 0.4 10*3/uL (ref 0.1–1.0)
Monocytes Relative: 5 %
Neutro Abs: 6.3 10*3/uL (ref 1.7–7.7)
Neutrophils Relative %: 76 %
Platelet Count: 164 10*3/uL (ref 150–400)
RBC: 3.67 MIL/uL — ABNORMAL LOW (ref 4.22–5.81)
RDW: 14.6 % (ref 11.5–15.5)
WBC Count: 8.3 10*3/uL (ref 4.0–10.5)
nRBC: 0 % (ref 0.0–0.2)

## 2019-10-17 LAB — CMP (CANCER CENTER ONLY)
ALT: 8 U/L (ref 0–44)
AST: 12 U/L — ABNORMAL LOW (ref 15–41)
Albumin: 3.5 g/dL (ref 3.5–5.0)
Alkaline Phosphatase: 68 U/L (ref 38–126)
Anion gap: 8 (ref 5–15)
BUN: 22 mg/dL (ref 8–23)
CO2: 27 mmol/L (ref 22–32)
Calcium: 9.4 mg/dL (ref 8.9–10.3)
Chloride: 106 mmol/L (ref 98–111)
Creatinine: 1.07 mg/dL (ref 0.61–1.24)
GFR, Estimated: >60 mL/min (ref >60)
Glucose, Bld: 91 mg/dL (ref 70–99)
Potassium: 4.1 mmol/L (ref 3.5–5.1)
Sodium: 141 mmol/L (ref 135–145)
Total Bilirubin: 0.3 mg/dL (ref 0.3–1.2)
Total Protein: 7.2 g/dL (ref 6.5–8.1)

## 2019-10-17 MED ORDER — LEUPROLIDE ACETATE (4 MONTH) 30 MG ~~LOC~~ KIT
30.0000 mg | PACK | Freq: Once | SUBCUTANEOUS | Status: AC
  Administered 2019-10-17: 30 mg via SUBCUTANEOUS

## 2019-10-17 MED ORDER — LEUPROLIDE ACETATE (4 MONTH) 30 MG ~~LOC~~ KIT
PACK | SUBCUTANEOUS | Status: AC
  Filled 2019-10-17: qty 30

## 2019-10-17 NOTE — Progress Notes (Signed)
Hematology and Oncology Follow Up Visit  SYLVIA KONDRACKI [id number] 1950/11/12 69 y.o. 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  He presented with Gleason score 4+5 = 9  and PSA of 397 at that time.  Prior Therapy: Status post androgen deprivation and initially with [place] subsequently with Lupron with a PSA dropping down to 500 and December 2015.  He failed to follow-up with the PSA rising up to 1400 and April 2018.  Current therapy:  Eligard 30 mg every 4 months.  He will receive 1 today and repeated in February 2022.   Zytiga 1000 mg daily with prednisone at 5 mg daily started in August 2018.  Interim History:  Mr. Shafer is here for a follow-up visit.  Since the last visit,     Medications: Unchanged on review. Current Outpatient Medications  Medication Sig Dispense Refill  . aspirin 81 MG tablet Take 81 mg by mouth daily.    . B-D ULTRAFINE III SHORT PEN 31G X 8 MM MISC USE AS DIRECTED WITH LEVEMIR PEN 100 each 5  . Blood Pressure Monitoring (BLOOD PRESSURE CUFF) MISC by Does not apply route. Check blood pressure at least 4 times per week 3 hours after taking medication    . Calcium Carbonate-Vitamin D (CALCIUM 600+D PO) Take 1 tablet by mouth daily.    . carvedilol (COREG) 6.25 MG tablet TAKE 1 TABLET BY MOUTH TWICE A DAY WITH FOOD 180 tablet 0  . cholecalciferol (VITAMIN D) 1000 UNITS tablet Take 5,000 Units by mouth daily.    Marland Kitchen glucose blood (ONETOUCH VERIO) test strip 1 each by Other route as needed for other. Use as instructed to check blood sugars    . Insulin Detemir (LEVEMIR FLEXTOUCH [place]) Inject into the skin.    . Insulin Syringe-Needle U-100 (BD INSULIN SYRINGE U/F) 31G X 5/16" 0.3 ML MISC by Does not apply route. Use as directed with levemir pen    . JANUVIA 100 MG tablet TAKE 1 TABLET BY MOUTH EVERY DAY 90  tablet 0  . Lancets (ONETOUCH DELICA PLUS [id number]) MISC by Does not apply route. Use as directed to check blood  Sugars 2 times per day dx E11.65    . LEVEMIR FLEXTOUCH 100 UNIT/ML Pen INJECT 10 UNITS BY SUBCUTANEOUS ROUTE EVERY BEDTIME 15 mL 0  . Multiple Vitamin (MULTIVITAMIN WITH MINERALS) TABS Take 1 tablet by mouth daily.    . NON FORMULARY Take 1 tablet by mouth daily. Thyroid  action    . Olmesartan-amLODIPine-HCTZ 40-10-25 MG TABS TAKE 1 TABLET BY MOUTH EVERY DAY 30 tablet 0  . Omega-3 Fatty Acids (FISH OIL PO) Take 1 tablet by mouth once.    . pioglitazone-metformin (ACTOPLUS MET) 15-850 MG tablet TAKE 1 TABLET BY MOUTH TWICE A DAY 180 tablet 1  . predniSONE (DELTASONE) 5 MG tablet TAKE 1 TABLET BY MOUTH EVERY DAY WITH BREAKFAST 30 tablet 1  . rosuvastatin (CRESTOR) 10 MG tablet TAKE 1 TABLET BY MOUTH EVERY DAY 30 tablet 0  . spironolactone (ALDACTONE) 25 MG tablet TAKE 1 TABLET BY MOUTH EVERY DAY 90 tablet 0  . ZYTIGA 250 MG tablet TAKE 4 TABLETS (1,000 MG TOTAL) BY MOUTH DAILY. TAKE ON AN EMPTY STOMACH 1 HOUR BEFORE OR 2 HOURS AFTER A MEAL 120 tablet 0   No current facility-administered medications for this visit.     Allergies:  No Active  Allergies     Physical Exam:       ECOG: 0    General appearance: Comfortable appearing without any discomfort Head: Normocephalic without any trauma Oropharynx: Mucous membranes are moist and pink without any thrush or ulcers. Eyes: Pupils are equal and round reactive to light. Lymph nodes: No cervical, supraclavicular, inguinal or axillary lymphadenopathy.   Heart:regular rate and rhythm.  S1 and S2 without leg edema. Lung: Clear without any rhonchi or wheezes.  No dullness to percussion. Abdomin: Soft, nontender, nondistended with good bowel sounds.  No hepatosplenomegaly. Musculoskeletal: No joint deformity or effusion.  Full range of motion noted. Neurological: No deficits noted on motor, sensory and deep tendon reflex  exam. Skin: No petechial rash or dryness.  Appeared moist.           Lab Results: Lab Results  Component Value Date   WBC 9.3 06/18/2019   HGB 13.2 06/18/2019   HCT 40.5 06/18/2019   MCV 105.2 (H) 06/18/2019   PLT 160 06/18/2019                  Results for DEKE, TILGHMAN (MRN [id number]) as of 10/17/2019 11:50  Ref. Range 02/18/2019 09:30 06/18/2019 09:14  Prostate Specific Ag, Serum Latest Ref Range: 0.0 - 4.0 ng/mL 8.8 (H) 8.5 (H)       69 year old man with:  1.  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    His disease status was updated at this time and risks and benefits of continuing Zytiga were discussed.  Laboratory data including a PSA in the last year were reviewed which showed a slight rise although overall stable clinical status.  PSA in June was 8.5 compared to 8.8 in February 2021.  Alternative treatment options including systemic chemotherapy was reiterated.  At this time I recommended continuing the same dose and schedule.    2. Androgen depravation: He will receive Eligard today and repeated in 4 months.  Complications including hot flashes, weight gain among others were reviewed.  3. Diabetes: Blood sugar continues to be controlled without any recent exacerbation.  4. Bone health: I recommended continuing calcium and vitamin D supplements.  Delton See was withheld due to dental issues.  5.  Hypertension:   6.  Prognosis and goals of care: Therapy remains palliative although aggressive measures are warranted given his excellent performance status.  The disease remains incurable.  7. Follow-up: In 4 months for repeat evaluation.   30 minutes were dedicated to this encounter.  The time was spent on reviewing laboratory data, updating his disease status, discussing treatment options and future plan of care review.   Zola Button, MD 10/8/202111:50 AM

## 2019-10-17 NOTE — Patient Instructions (Signed)

## 2019-10-17 NOTE — Telephone Encounter (Signed)
Scheduled per 10/8 los. Printed avs and calendar for pt.

## 2019-10-18 LAB — PROSTATE-SPECIFIC AG, SERUM (LABCORP): Prostate Specific Ag, Serum: 20.3 ng/mL — ABNORMAL HIGH (ref 0.0–4.0)

## 2019-10-23 ENCOUNTER — Other Ambulatory Visit: Payer: Self-pay | Admitting: Nurse Practitioner

## 2019-10-23 DIAGNOSIS — E782 Mixed hyperlipidemia: Secondary | ICD-10-CM

## 2019-10-23 MED FILL — ABIRATERONE ACETATE 250 MG: 250 | 30 days supply | Qty: 120 | Fill #0

## 2019-11-06 ENCOUNTER — Other Ambulatory Visit: Payer: Self-pay | Admitting: Nurse Practitioner

## 2019-11-08 ENCOUNTER — Other Ambulatory Visit: Payer: Self-pay | Admitting: Nurse Practitioner

## 2019-11-08 DIAGNOSIS — E782 Mixed hyperlipidemia: Secondary | ICD-10-CM

## 2019-11-14 ENCOUNTER — Other Ambulatory Visit: Payer: Self-pay | Admitting: Oncology

## 2019-11-14 DIAGNOSIS — C61 Malignant neoplasm of prostate: Secondary | ICD-10-CM

## 2019-11-19 ENCOUNTER — Other Ambulatory Visit: Payer: Self-pay | Admitting: Oncology

## 2019-11-19 DIAGNOSIS — C61 Malignant neoplasm of prostate: Secondary | ICD-10-CM

## 2019-11-20 ENCOUNTER — Other Ambulatory Visit: Payer: Self-pay | Admitting: Nurse Practitioner

## 2019-11-20 MED FILL — ABIRATERONE ACETATE 250 MG: 250 | 30 days supply | Qty: 120 | Fill #0

## 2019-11-25 ENCOUNTER — Encounter: Payer: Self-pay | Admitting: Nurse Practitioner

## 2019-11-25 ENCOUNTER — Other Ambulatory Visit: Payer: Self-pay

## 2019-11-25 ENCOUNTER — Ambulatory Visit: Payer: Medicare PPO | Admitting: Nurse Practitioner

## 2019-11-25 VITALS — BP 124/80 | HR 64 | Temp 97.7°F | Ht 67.0 in | Wt 206.2 lb

## 2019-11-25 DIAGNOSIS — Z23 Encounter for immunization: Secondary | ICD-10-CM

## 2019-11-25 DIAGNOSIS — C61 Malignant neoplasm of prostate: Secondary | ICD-10-CM | POA: Diagnosis not present

## 2019-11-25 DIAGNOSIS — E119 Type 2 diabetes mellitus without complications: Secondary | ICD-10-CM

## 2019-11-25 DIAGNOSIS — C7951 Secondary malignant neoplasm of bone: Secondary | ICD-10-CM | POA: Diagnosis not present

## 2019-11-25 DIAGNOSIS — E782 Mixed hyperlipidemia: Secondary | ICD-10-CM

## 2019-11-25 DIAGNOSIS — R059 Cough, unspecified: Secondary | ICD-10-CM | POA: Diagnosis not present

## 2019-11-25 DIAGNOSIS — I10 Essential (primary) hypertension: Secondary | ICD-10-CM | POA: Diagnosis not present

## 2019-11-25 MED ORDER — PIOGLITAZONE HCL-METFORMIN HCL 15-850 MG PO TABS: 1.0000 | ORAL_TABLET | Freq: Two times a day (BID) | ORAL | 1 refills | Status: DC

## 2019-11-25 MED ORDER — LEVEMIR FLEXTOUCH 100 UNIT/ML ~~LOC~~ SOPN: 15.0000 [IU] | PEN_INJECTOR | Freq: Every day | SUBCUTANEOUS | 1 refills | Status: DC

## 2019-11-25 NOTE — Patient Instructions (Addendum)
Hypertension, Adult Hypertension is another name for high blood pressure. High blood pressure forces your heart to work harder to pump blood. This can cause problems over time. There are two numbers in a blood pressure reading. There is a top number (systolic) over a bottom number (diastolic). It is best to have a blood pressure that is below 120/80. Healthy choices can help lower your blood pressure, or you may need medicine to help lower it. What are the causes? The cause of this condition is not known. Some conditions may be related to high blood pressure. What increases the risk?  Smoking.  Having type 2 diabetes mellitus, high cholesterol, or both.  Not getting enough exercise or physical activity.  Being overweight.  Having too much fat, sugar, calories, or salt (sodium) in your diet.  Drinking too much alcohol.  Having long-term (chronic) kidney disease.  Having a family history of high blood pressure.  Age. Risk increases with age.  Race. You may be at higher risk if you are African American.  Gender. Men are at higher risk than women before age 45. After age 65, women are at higher risk than men.  Having obstructive sleep apnea.  Stress. What are the signs or symptoms?  High blood pressure may not cause symptoms. Very high blood pressure (hypertensive crisis) may cause: ? Headache. ? Feelings of worry or nervousness (anxiety). ? Shortness of breath. ? Nosebleed. ? A feeling of being sick to your stomach (nausea). ? Throwing up (vomiting). ? Changes in how you see. ? Very bad chest pain. ? Seizures. How is this treated?  This condition is treated by making healthy lifestyle changes, such as: ? Eating healthy foods. ? Exercising more. ? Drinking less alcohol.  Your health care provider may prescribe medicine if lifestyle changes are not enough to get your blood pressure under control, and if: ? Your top number is above 130. ? Your bottom number is above  80.  Your personal target blood pressure may vary. Follow these instructions at home: Eating and drinking   If told, follow the DASH eating plan. To follow this plan: ? Fill one half of your plate at each meal with fruits and vegetables. ? Fill one fourth of your plate at each meal with whole grains. Whole grains include whole-wheat pasta, brown rice, and whole-grain bread. ? Eat or drink low-fat dairy products, such as skim milk or low-fat yogurt. ? Fill one fourth of your plate at each meal with low-fat (lean) proteins. Low-fat proteins include fish, chicken without skin, eggs, beans, and tofu. ? Avoid fatty meat, cured and processed meat, or chicken with skin. ? Avoid pre-made or processed food.  Eat less than 1,500 mg of salt each day.  Do not drink alcohol if: ? Your doctor tells you not to drink. ? You are pregnant, may be pregnant, or are planning to become pregnant.  If you drink alcohol: ? Limit how much you use to:  0-1 drink a day for women.  0-2 drinks a day for men. ? Be aware of how much alcohol is in your drink. In the [place] one drink equals one 12 oz bottle of beer (355 mL), one 5 oz glass of wine (148 mL), or one 1 oz glass of hard liquor (44 mL). Lifestyle   Work with your doctor to stay at a healthy weight or to lose weight. Ask your doctor what the best weight is for you.  Get at least 30 minutes of exercise most   days of the week. This may include walking, swimming, or biking.  Get at least 30 minutes of exercise that strengthens your muscles (resistance exercise) at least 3 days a week. This may include lifting weights or doing Pilates.  Do not use any products that contain nicotine or tobacco, such as cigarettes, e-cigarettes, and chewing tobacco. If you need help quitting, ask your doctor.  Check your blood pressure at home as told by your doctor.  Keep all follow-up visits as told by your doctor. This is important. Medicines  Take over-the-counter  and prescription medicines only as told by your doctor. Follow directions carefully.  Do not skip doses of blood pressure medicine. The medicine does not work as well if you skip doses. Skipping doses also puts you at risk for problems.  Ask your doctor about side effects or reactions to medicines that you should watch for. Contact a doctor if you:  Think you are having a reaction to the medicine you are taking.  Have headaches that keep coming back (recurring).  Feel dizzy.  Have swelling in your ankles.  Have trouble with your vision. Get help right away if you:  Get a very bad headache.  Start to feel mixed up (confused).  Feel weak or numb.  Feel faint.  Have very bad pain in your: ? Chest. ? Belly (abdomen).  Throw up more than once.  Have trouble breathing. Summary  Hypertension is another name for high blood pressure.  High blood pressure forces your heart to work harder to pump blood.  For most people, a normal blood pressure is less than 120/80.  Making healthy choices can help lower blood pressure. If your blood pressure does not get lower with healthy choices, you may need to take medicine. This information is not intended to replace advice given to you by your health care provider. Make sure you discuss any questions you have with your health care provider. Document Revised: 09/05/2017 Document Reviewed: 09/05/2017 Elsevier Patient Education  [place].   Influenza Virus Vaccine (Flucelvax) What is this medicine? INFLUENZA VIRUS VACCINE (in floo EN zuh VAHY ruhs vak SEEN) helps to reduce the risk of getting influenza also known as the flu. The vaccine only helps protect you against some strains of the flu. This medicine may be used for other purposes; ask your health care provider or pharmacist if you have questions. COMMON BRAND NAME(S): FLUCELVAX What should I tell my health care provider before I take this medicine? They need to know if you  have any of these conditions:  bleeding disorder like hemophilia  fever or infection  Guillain-Barre syndrome or other neurological problems  immune system problems  infection with the human immunodeficiency virus (HIV) or AIDS  low blood platelet counts  multiple sclerosis  an unusual or allergic reaction to influenza virus vaccine, other medicines, foods, dyes or preservatives  pregnant or trying to get pregnant  breast-feeding How should I use this medicine? This vaccine is for injection into a muscle. It is given by a health care professional. A copy of Vaccine Information Statements will be given before each vaccination. Read this sheet carefully each time. The sheet may change frequently. Talk to your pediatrician regarding the use of this medicine in children. Special care may be needed. Overdosage: If you think you've taken too much of this medicine contact a poison control center or emergency room at once. Overdosage: If you think you have taken too much of this medicine contact a poison  control center or emergency room at once. NOTE: This medicine is only for you. Do not share this medicine with others. What if I miss a dose? This does not apply. What may interact with this medicine?  chemotherapy or radiation therapy  medicines that lower your immune system like etanercept, anakinra, infliximab, and adalimumab  medicines that treat or prevent blood clots like warfarin  phenytoin  steroid medicines like prednisone or cortisone  theophylline  vaccines This list may not describe all possible interactions. Give your health care provider a list of all the medicines, herbs, non-prescription drugs, or dietary supplements you use. Also tell them if you smoke, drink alcohol, or use illegal drugs. Some items may interact with your medicine. What should I watch for while using this medicine? Report any side effects that do not go away within 3 days to your doctor or  health care professional. Call your health care provider if any unusual symptoms occur within 6 weeks of receiving this vaccine. You may still catch the flu, but the illness is not usually as bad. You cannot get the flu from the vaccine. The vaccine will not protect against colds or other illnesses that may cause fever. The vaccine is needed every year. What side effects may I notice from receiving this medicine? Side effects that you should report to your doctor or health care professional as soon as possible:  allergic reactions like skin rash, itching or hives, swelling of the face, lips, or tongue Side effects that usually do not require medical attention (Report these to your doctor or health care professional if they continue or are bothersome.):  fever  headache  muscle aches and pains  pain, tenderness, redness, or swelling at the injection site  tiredness This list may not describe all possible side effects. Call your doctor for medical advice about side effects. You may report side effects to FDA at 1-800-FDA-1088. Where should I keep my medicine? The vaccine will be given by a health care professional in a clinic, pharmacy, doctor's office, or other health care setting. You will not be given vaccine doses to store at home. NOTE: This sheet is a summary. It may not cover all possible information. If you have questions about this medicine, talk to your doctor, pharmacist, or health care provider.  2020 Elsevier/Gold Standard (2010-12-07 14:06:47)

## 2019-11-25 NOTE — Progress Notes (Signed)
I,Kyle George [employer] as a Education administrator for [employer], [name].,have documented all relevant documentation on the behalf of Kyle Brine, [name],as directed by  Kyle Brine, [name] while in the presence of Kyle George, Kyle George. This visit occurred during the SARS-CoV-2 public health emergency.  Safety protocols were in place, including screening questions prior to the visit, additional usage of staff PPE, and extensive cleaning of exam room while observing appropriate contact time as indicated for disinfecting solutions.  Subjective:     Patient ID: Kyle George , male    DOB: [birth year] , 69 y.o.   MRN: [id number]   Chief Complaint  Patient presents with  . Hypertension  . Diabetes    HPI  Patient here for a blood pressure f/u.  Wt Readings from Last 3 Encounters: 11/25/19 : 206 lb 3.2 oz (93.5 kg) 10/17/19 : 203 lb 14.4 oz (92.5 kg) 06/18/19 : 211 lb 6.4 oz (95.9 kg)  He has not been in the office for a visit since Nov 2020, he was a no show for his appt in February and never rescheduled.  Hypertension This is a chronic problem. The current episode started more than 1 year ago. The problem is unchanged. The problem is controlled. Pertinent negatives include no chest pain, headaches, malaise/fatigue, palpitations or shortness of breath. There are no associated agents to hypertension. There are no known risk factors for coronary artery disease. Past treatments include ACE inhibitors. There are no compliance problems.  There is no history of kidney disease. There is no history of chronic renal disease.  Diabetes He presents for his follow-up diabetic visit. He has type 2 diabetes mellitus. There are no hypoglycemic associated symptoms. Pertinent negatives for hypoglycemia include no dizziness or headaches. There are no diabetic associated symptoms. Pertinent negatives for diabetes include no chest pain, no fatigue, no polydipsia, no polyphagia and no polyuria. There are no hypoglycemic  complications. There are no diabetic complications. Risk factors for coronary artery disease include male sex and sedentary lifestyle. Current diabetic treatment includes oral agent (dual therapy). He is compliant with treatment some of the time. He is following a generally healthy diet. When asked about meal planning, he reported none. He has not had a previous visit with a dietitian. Exercise: he works part time as a Retail buyer. (Does not check his blood sugar at home. He does not have a machine)     Past Medical History:  Diagnosis Date  . Cancer of prostate ([place])   . Diabetes mellitus without complication ([place])   . Hyperlipidemia   . Hypertension      Family History  Problem Relation Age of Onset  . Hypertension Mother   . Diabetes Mother   . Cancer Father   . Colon cancer Neg Hx   . Esophageal cancer Neg Hx   . Stomach cancer Neg Hx   . Rectal cancer Neg Hx      Current Outpatient Medications:  .  abiraterone acetate (ZYTIGA) 250 MG tablet, TAKE 4 TABLETS (1,000 MG TOTAL) BY MOUTH DAILY. TAKE ON AN EMPTY STOMACH 1 HOUR BEFORE OR 2 HOURS AFTER A MEAL, Disp: 120 tablet, Rfl: 0 .  aspirin 81 MG tablet, Take 81 mg by mouth daily., Disp: , Rfl:  .  B-D ULTRAFINE III SHORT PEN 31G X 8 MM MISC, USE AS DIRECTED WITH LEVEMIR PEN, Disp: 100 each, Rfl: 5 .  Calcium Carbonate-Vitamin D (CALCIUM 600+D PO), Take 1 tablet by mouth daily., Disp: , Rfl:  .  carvedilol (COREG)  6.25 MG tablet, TAKE 1 TABLET BY MOUTH TWICE A DAY WITH FOOD, Disp: 180 tablet, Rfl: 0 .  cholecalciferol (VITAMIN D) 1000 UNITS tablet, Take 5,000 Units by mouth daily., Disp: , Rfl:  .  glucose blood (ONETOUCH VERIO) test strip, 1 each by Other route as needed for other. Use as instructed to check blood sugars, Disp: , Rfl:  .  insulin detemir (LEVEMIR FLEXTOUCH) 100 UNIT/ML FlexPen, Inject 15 Units into the skin daily., Disp: 15 mL, Rfl: 1 .  Insulin Syringe-Needle U-100 (BD INSULIN SYRINGE U/F) 31G X 5/16" 0.3 ML MISC, by  Does not apply route. Use as directed with levemir pen, Disp: , Rfl:  .  JANUVIA 100 MG tablet, TAKE 1 TABLET BY MOUTH EVERY DAY, Disp: 90 tablet, Rfl: 0 .  Lancets (ONETOUCH DELICA PLUS [id number]) MISC, by Does not apply route. Use as directed to check blood  Sugars 2 times per day dx E11.65, Disp: , Rfl:  .  Multiple Vitamin (MULTIVITAMIN WITH MINERALS) TABS, Take 1 tablet by mouth daily., Disp: , Rfl:  .  Olmesartan-amLODIPine-HCTZ 40-10-25 MG TABS, TAKE 1 TABLET BY MOUTH EVERY DAY, Disp: 30 tablet, Rfl: 0 .  Omega-3 Fatty Acids (FISH OIL PO), Take 1 tablet by mouth once., Disp: , Rfl:  .  pioglitazone-metformin (ACTOPLUS MET) 15-850 MG tablet, Take 1 tablet by mouth 2 (two) times daily., Disp: 180 tablet, Rfl: 1 .  predniSONE (DELTASONE) 5 MG tablet, TAKE 1 TABLET BY MOUTH EVERY DAY WITH BREAKFAST, Disp: 30 tablet, Rfl: 1 .  rosuvastatin (CRESTOR) 10 MG tablet, TAKE 1 TABLET BY MOUTH EVERY DAY, Disp: 30 tablet, Rfl: 0 .  spironolactone (ALDACTONE) 25 MG tablet, TAKE 1 TABLET BY MOUTH EVERY DAY, Disp: 90 tablet, Rfl: 1 .  Blood Pressure Monitoring (BLOOD PRESSURE CUFF) MISC, by Does not apply route. Check blood pressure at least 4 times per week 3 hours after taking medication (Patient not taking: Reported on 11/25/2019), Disp: , Rfl:  .  NON FORMULARY, Take 1 tablet by mouth daily. Thyroid  action (Patient not taking: Reported on 11/25/2019), Disp: , Rfl:    No Known Allergies   Review of Systems  Constitutional: Negative.  Negative for fatigue and malaise/fatigue.  Respiratory: Negative for shortness of breath.   Cardiovascular: Negative for chest pain, palpitations and leg swelling.  Endocrine: Negative for polydipsia, polyphagia and polyuria.  Neurological: Negative for dizziness and headaches.  Psychiatric/Behavioral: Negative.      Today's Vitals   11/25/19 0944  BP: 124/80  Pulse: 64  Temp: 97.7 F (36.5 C)  TempSrc: Oral  SpO2: 96%  Weight: 206 lb 3.2 oz (93.5 kg)  Height:  '5\' 7"'  (1.702 m)  PainSc: 0-No pain   Body mass index is 32.3 kg/m.   Objective:  Physical Exam Vitals reviewed.  Constitutional:      General: He is not in acute distress.    Appearance: Normal appearance. He is obese.  Cardiovascular:     Rate and Rhythm: Normal rate and regular rhythm.     Pulses: Normal pulses.     Heart sounds: Normal heart sounds. No murmur heard.   Pulmonary:     Effort: Pulmonary effort is normal. No respiratory distress.     Breath sounds: Normal breath sounds.  Musculoskeletal:        General: No swelling.  Skin:    General: Skin is warm and dry.     Capillary Refill: Capillary refill takes less than 2 seconds.  Neurological:  General: No focal deficit present.     Mental Status: He is alert and oriented to person, place, and time.  Psychiatric:        Mood and Affect: Mood normal.        Behavior: Behavior normal.        Thought Content: Thought content normal.        Judgment: Judgment normal.         Assessment And Plan:     1. Essential hypertension  Chronic, well controlled  Continue with current medications.   2. Type 2 diabetes mellitus without complication, without long-term current use of insulin (HCC)  Chronic, he reports he is not checking his blood sugar regularly - Hemoglobin A1c - insulin detemir (LEVEMIR FLEXTOUCH) 100 UNIT/ML FlexPen; Inject 15 Units into the skin daily.  Dispense: 15 mL; Refill: 1 - pioglitazone-metformin (ACTOPLUS MET) 15-850 MG tablet; Take 1 tablet by mouth 2 (two) times daily.  Dispense: 180 tablet; Refill: 1  3. Mixed hyperlipidemia  Chronic, controlled  Continue with current medications - Lipid panel  4. Prostate cancer [facility])  Continue follow up with Dr. 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 Keene Breath  6. Need for influenza vaccination  Influenza vaccine administered  Encouraged to take Tylenol as needed for fever or muscle aches. - Flu  Vaccine QUAD High Dose(Fluad)  7. Cough  Will check for covid 19 since this is new onset, he is encouraged to remain isolated until he has a negative result  Stay well hydrated with water - Novel Coronavirus, NAA ([facility])     Patient was given opportunity to ask questions. Patient verbalized understanding of the plan and was able to repeat key elements of the plan. All questions were answered to their satisfaction.    Teola Bradley, [name], have reviewed all documentation for this visit. The documentation on 11/25/19 for the exam, diagnosis, procedures, and orders are all accurate and complete.  THE PATIENT IS ENCOURAGED TO PRACTICE SOCIAL DISTANCING DUE TO THE COVID-19 PANDEMIC.

## 2019-11-26 LAB — SARS-COV-2, NAA 2 DAY TAT

## 2019-11-26 LAB — NOVEL CORONAVIRUS, NAA: SARS-CoV-2, NAA: NOT DETECTED

## 2019-11-28 ENCOUNTER — Other Ambulatory Visit: Payer: Self-pay | Admitting: Nurse Practitioner

## 2019-11-28 DIAGNOSIS — C61 Malignant neoplasm of prostate: Secondary | ICD-10-CM

## 2019-12-02 DIAGNOSIS — E782 Mixed hyperlipidemia: Secondary | ICD-10-CM | POA: Diagnosis not present

## 2019-12-02 DIAGNOSIS — E119 Type 2 diabetes mellitus without complications: Secondary | ICD-10-CM | POA: Diagnosis not present

## 2019-12-03 LAB — LIPID PANEL
Chol/HDL Ratio: 1.8 ratio (ref 0.0–5.0)
Cholesterol, Total: 115 mg/dL (ref 100–199)
HDL: 65 mg/dL (ref >39)
LDL Chol Calc (NIH): 34 mg/dL (ref 0–99)
Triglycerides: 80 mg/dL (ref 0–149)
VLDL Cholesterol Cal: 16 mg/dL (ref 5–40)

## 2019-12-03 LAB — HEMOGLOBIN A1C
Est. average glucose Bld gHb Est-mCnc: 134 mg/dL
Hgb A1c MFr Bld: 6.3 % — ABNORMAL HIGH (ref 4.8–5.6)

## 2019-12-06 ENCOUNTER — Other Ambulatory Visit: Payer: Self-pay | Admitting: Nurse Practitioner

## 2019-12-09 ENCOUNTER — Ambulatory Visit: Payer: Medicare Other | Admitting: Nurse Practitioner

## 2019-12-09 ENCOUNTER — Ambulatory Visit: Payer: Medicare Other

## 2019-12-09 ENCOUNTER — Other Ambulatory Visit: Payer: Self-pay | Admitting: Nurse Practitioner

## 2019-12-09 DIAGNOSIS — C61 Malignant neoplasm of prostate: Secondary | ICD-10-CM

## 2019-12-11 ENCOUNTER — Other Ambulatory Visit: Payer: Self-pay | Admitting: Oncology

## 2019-12-11 DIAGNOSIS — C61 Malignant neoplasm of prostate: Secondary | ICD-10-CM

## 2019-12-13 ENCOUNTER — Other Ambulatory Visit: Payer: Self-pay | Admitting: Nurse Practitioner

## 2019-12-13 DIAGNOSIS — E782 Mixed hyperlipidemia: Secondary | ICD-10-CM

## 2019-12-23 MED FILL — ABIRATERONE ACETATE 250 MG: 250 | 30 days supply | Qty: 120 | Fill #0

## 2019-12-31 ENCOUNTER — Other Ambulatory Visit: Payer: Self-pay

## 2019-12-31 DIAGNOSIS — E782 Mixed hyperlipidemia: Secondary | ICD-10-CM

## 2019-12-31 MED ORDER — ROSUVASTATIN CALCIUM 10 MG PO TABS: 10.0000 mg | ORAL_TABLET | Freq: Every day | ORAL | 1 refills | Status: DC

## 2020-01-08 ENCOUNTER — Other Ambulatory Visit: Payer: Self-pay | Admitting: Nurse Practitioner

## 2020-01-17 ENCOUNTER — Other Ambulatory Visit: Payer: Self-pay | Admitting: Oncology

## 2020-01-17 DIAGNOSIS — C61 Malignant neoplasm of prostate: Secondary | ICD-10-CM

## 2020-01-19 ENCOUNTER — Telehealth: Payer: Self-pay

## 2020-01-19 NOTE — Telephone Encounter (Signed)
Oral Oncology Patient Advocate Encounter   Was successful in securing patient a $[id number] grant from [employer] to provide copayment coverage for Zytiga.  This will keep the out of pocket expense at $0.        The billing information is as follows and has been shared with [place].   Member ID: [id number] Group ID: [id number] RxBin: [id number] PCN: [id number] Dates of Eligibility: 01/14/20 through 01/13/21  Fund name:  Prostate.  [place] Patient [place] Phone [phone] Fax [phone] 01/19/2020 12:03 PM

## 2020-01-22 ENCOUNTER — Other Ambulatory Visit: Payer: Self-pay | Admitting: Oncology

## 2020-01-22 DIAGNOSIS — C61 Malignant neoplasm of prostate: Secondary | ICD-10-CM

## 2020-01-22 MED FILL — ABIRATERONE ACETATE 250 MG: 250 | 30 days supply | Qty: 120 | Fill #0

## 2020-02-06 ENCOUNTER — Other Ambulatory Visit: Payer: Self-pay | Admitting: Nurse Practitioner

## 2020-02-13 ENCOUNTER — Inpatient Hospital Stay: Payer: Medicare PPO

## 2020-02-13 ENCOUNTER — Inpatient Hospital Stay: Payer: Medicare PPO | Attending: Oncology | Admitting: Oncology

## 2020-02-13 ENCOUNTER — Other Ambulatory Visit: Payer: Self-pay | Admitting: Oncology

## 2020-02-13 ENCOUNTER — Telehealth: Payer: Self-pay | Admitting: Pharmacist

## 2020-02-13 ENCOUNTER — Other Ambulatory Visit: Payer: Self-pay

## 2020-02-13 ENCOUNTER — Telehealth: Payer: Self-pay

## 2020-02-13 VITALS — BP 135/67 | HR 69 | Temp 97.7°F | Resp 18 | Ht 67.0 in | Wt 212.1 lb

## 2020-02-13 DIAGNOSIS — I10 Essential (primary) hypertension: Secondary | ICD-10-CM | POA: Insufficient documentation

## 2020-02-13 DIAGNOSIS — R591 Generalized enlarged lymph nodes: Secondary | ICD-10-CM | POA: Diagnosis not present

## 2020-02-13 DIAGNOSIS — C61 Malignant neoplasm of prostate: Secondary | ICD-10-CM

## 2020-02-13 DIAGNOSIS — Z7952 Long term (current) use of systemic steroids: Secondary | ICD-10-CM | POA: Insufficient documentation

## 2020-02-13 DIAGNOSIS — C7951 Secondary malignant neoplasm of bone: Secondary | ICD-10-CM | POA: Diagnosis not present

## 2020-02-13 DIAGNOSIS — Z7982 Long term (current) use of aspirin: Secondary | ICD-10-CM | POA: Diagnosis not present

## 2020-02-13 DIAGNOSIS — Z192 Hormone resistant malignancy status: Secondary | ICD-10-CM | POA: Insufficient documentation

## 2020-02-13 LAB — CBC WITH DIFFERENTIAL (CANCER CENTER ONLY)
Abs Immature Granulocytes: 0.02 10*3/uL (ref 0.00–0.07)
Basophils Absolute: 0 10*3/uL (ref 0.0–0.1)
Basophils Relative: 1 %
Eosinophils Absolute: 0.2 10*3/uL (ref 0.0–0.5)
Eosinophils Relative: 3 %
HCT: 37 % — ABNORMAL LOW (ref 39.0–52.0)
Hemoglobin: 12.2 g/dL — ABNORMAL LOW (ref 13.0–17.0)
Immature Granulocytes: 0 %
Lymphocytes Relative: 18 %
Lymphs Abs: 1.5 10*3/uL (ref 0.7–4.0)
MCH: 34.2 pg — ABNORMAL HIGH (ref 26.0–34.0)
MCHC: 33 g/dL (ref 30.0–36.0)
MCV: 103.6 fL — ABNORMAL HIGH (ref 80.0–100.0)
Monocytes Absolute: 0.4 10*3/uL (ref 0.1–1.0)
Monocytes Relative: 5 %
Neutro Abs: 6.2 10*3/uL (ref 1.7–7.7)
Neutrophils Relative %: 73 %
Platelet Count: 154 10*3/uL (ref 150–400)
RBC: 3.57 MIL/uL — ABNORMAL LOW (ref 4.22–5.81)
RDW: 14.4 % (ref 11.5–15.5)
WBC Count: 8.4 10*3/uL (ref 4.0–10.5)
nRBC: 0 % (ref 0.0–0.2)

## 2020-02-13 LAB — CMP (CANCER CENTER ONLY)
ALT: 8 U/L (ref 0–44)
AST: 13 U/L — ABNORMAL LOW (ref 15–41)
Albumin: 3.7 g/dL (ref 3.5–5.0)
Alkaline Phosphatase: 69 U/L (ref 38–126)
Anion gap: 9 (ref 5–15)
BUN: 26 mg/dL — ABNORMAL HIGH (ref 8–23)
CO2: 25 mmol/L (ref 22–32)
Calcium: 9.2 mg/dL (ref 8.9–10.3)
Chloride: 105 mmol/L (ref 98–111)
Creatinine: 1.07 mg/dL (ref 0.61–1.24)
GFR, Estimated: >60 mL/min (ref >60)
Glucose, Bld: 121 mg/dL — ABNORMAL HIGH (ref 70–99)
Potassium: 4.4 mmol/L (ref 3.5–5.1)
Sodium: 139 mmol/L (ref 135–145)
Total Bilirubin: 0.3 mg/dL (ref 0.3–1.2)
Total Protein: 7.2 g/dL (ref 6.5–8.1)

## 2020-02-13 MED ORDER — XTANDI 40 MG PO CAPS: 160.0000 mg | ORAL_CAPSULE | Freq: Every day | ORAL | 0 refills | Status: DC

## 2020-02-13 MED ORDER — LEUPROLIDE ACETATE (4 MONTH) 30 MG ~~LOC~~ KIT
30.0000 mg | PACK | Freq: Once | SUBCUTANEOUS | Status: AC
  Administered 2020-02-13: 30 mg via SUBCUTANEOUS

## 2020-02-13 MED ORDER — LEUPROLIDE ACETATE (4 MONTH) 30 MG ~~LOC~~ KIT
PACK | SUBCUTANEOUS | Status: AC
  Filled 2020-02-13: qty 30

## 2020-02-13 NOTE — Telephone Encounter (Addendum)
Oral Oncology Pharmacist Encounter  Received new prescription for Xtandi (enzalutamide) for the treatment of metastatic castration-resistant prostate cancer in conjunction with ADT, planned duration until disease progression or unacceptable drug toxicity.  Prescription dose and frequency assessed for appropriateness. Capsule formulation of Xtandi changed to tablets. Appropriate for therapy initiation.   CBC w/ Diff and CMP from 02/13/20 assessed, overall labs stable for treatment initiation.  Current medication list in Epic reviewed, DDIs with Xtandi identified:  Category C DDI between [place] and Amlodipine - Gillermina Phy is a strong CYP3A4 inducer and may decrease efficacy of amlodipine - will counsel patient to monitor for increase in BP while on therapy. Noted BP from 02/13/20 135/57 mmHg.   Evaluated chart and no patient barriers to medication adherence noted.   Prescription has been e-scribed to the [facility] for benefits analysis and approval.  Oral Oncology Clinic will continue to follow for insurance authorization, copayment issues, initial counseling and start date.  Leron Croak, PharmD, BCPS Hematology/Oncology Clinical Pharmacist [facility] [phone] 02/13/2020 12:18 PM

## 2020-02-13 NOTE — Patient Instructions (Signed)

## 2020-02-13 NOTE — Progress Notes (Signed)
Hematology and Oncology Follow Up Visit  Kyle George [id number] [birth year] 70 y.o. 02/13/2020 11:31 AM Kyle George, FNPM[place], [name], [name]   Principle Diagnosis: 70 year old man with castration-resistant prostate cancer presented with Gleason score 4+5 = 9  and PSA of 397 in 2013.  He has with disease to the bone and lymphadenopathy currently.  Prior Therapy: Status post androgen deprivation and initially with [place] subsequently with Lupron with a PSA dropping down to 500 and December 2015.  He failed to follow-up with the PSA rising up to 1400 and April 2018.  Current therapy:  Eligard 30 mg every 4 months.  He will receive Eligard today and repeated every 4 months.  Zytiga 1000 mg daily with prednisone at 5 mg daily started in August 2018.  Interim History:  Kyle George presents today for repeat follow-up.  Since the last visit, he reports no major changes in his health.  He has tolerated Zytiga without any complaints at this time.  He denies any bone pain or pathological fractures.  He denies any hospitalization or illnesses.  His performance status quality of life remained excellent.  He continues to work full-time although planning to retire at the end of this year.     Medications: Updated on review. Current Outpatient Medications  Medication Sig Dispense Refill  . abiraterone acetate (ZYTIGA) 250 MG tablet TAKE 4 TABLETS (1,000 MG TOTAL) BY MOUTH DAILY. TAKE ON AN EMPTY STOMACH 1 HOUR BEFORE OR 2 HOURS AFTER A MEAL 120 tablet 0  . aspirin 81 MG tablet Take 81 mg by mouth daily.    . B-D ULTRAFINE III SHORT PEN 31G X 8 MM MISC USE AS DIRECTED WITH LEVEMIR PEN 100 each 5  . Blood Pressure Monitoring (BLOOD PRESSURE CUFF) MISC by Does not apply route. Check blood pressure at least 4 times per week 3 hours after taking medication (Patient not taking: Reported on 11/25/2019)    . Calcium Carbonate-Vitamin D (CALCIUM 600+D PO) Take 1 tablet by mouth daily.    . carvedilol (COREG)  6.25 MG tablet TAKE 1 TABLET BY MOUTH TWICE A DAY WITH FOOD 180 tablet 0  . cholecalciferol (VITAMIN D) 1000 UNITS tablet Take 5,000 Units by mouth daily.    [place] Kitchen glucose blood (ONETOUCH VERIO) test strip 1 each by Other route as needed for other. Use as instructed to check blood sugars    . insulin detemir (LEVEMIR FLEXTOUCH) 100 UNIT/ML FlexPen Inject 15 Units into the skin daily. 15 mL 1  . Insulin Syringe-Needle U-100 (BD INSULIN SYRINGE U/F) 31G X 5/16" 0.3 ML MISC by Does not apply route. Use as directed with levemir pen    . JANUVIA 100 MG tablet TAKE 1 TABLET BY MOUTH EVERY DAY 90 tablet 0  . Lancets (ONETOUCH DELICA PLUS [id number]) MISC by Does not apply route. Use as directed to check blood  Sugars 2 times per day dx E11.65    . Multiple Vitamin (MULTIVITAMIN WITH MINERALS) TABS Take 1 tablet by mouth daily.    . NON FORMULARY Take 1 tablet by mouth daily. Thyroid  action (Patient not taking: Reported on 11/25/2019)    . Olmesartan-amLODIPine-HCTZ 40-10-25 MG TABS TAKE 1 TABLET BY MOUTH EVERY DAY 30 tablet 2  . Omega-3 Fatty Acids (FISH OIL PO) Take 1 tablet by mouth once.    . pioglitazone-metformin (ACTOPLUS MET) 15-850 MG tablet Take 1 tablet by mouth 2 (two) times daily. 180 tablet 1  . predniSONE (DELTASONE) 5 MG tablet TAKE 1 TABLET BY  MOUTH EVERY DAY WITH BREAKFAST 30 tablet 1  . rosuvastatin (CRESTOR) 10 MG tablet Take 1 tablet (10 mg total) by mouth daily. 90 tablet 1  . spironolactone (ALDACTONE) 25 MG tablet TAKE 1 TABLET BY MOUTH EVERY DAY 90 tablet 1   No current facility-administered medications for this visit.     Allergies:  No Known Allergies     Physical Exam:   Blood pressure 135/67, pulse 69, temperature 97.7 F (36.5 C), temperature source Tympanic, resp. rate 18, height '5\' 7"'  (1.702 m), weight 212 lb 1.6 oz (96.2 kg), SpO2 100 %.     ECOG: 0    General appearance: Alert, awake without any distress. Head: Atraumatic without  abnormalities Oropharynx: Without any thrush or ulcers. Eyes: No scleral icterus. Lymph nodes: No lymphadenopathy noted in the cervical, supraclavicular, or axillary nodes Heart:regular rate and rhythm, without any murmurs or gallops.   Lung: Clear to auscultation without any rhonchi, wheezes or dullness to percussion. Abdomin: Soft, nontender without any shifting dullness or ascites. Musculoskeletal: No clubbing or cyanosis. Neurological: No motor or sensory deficits. Skin: No rashes or lesions.          Lab Results: Lab Results  Component Value Date   WBC 8.3 10/17/2019   HGB 12.3 (L) 10/17/2019   HCT 37.2 (L) 10/17/2019   MCV 101.4 (H) 10/17/2019   PLT 164 10/17/2019                  Results for Kyle George (MRN [id number]) as of 10/17/2019 11:50  Ref. Range 02/18/2019 09:30 06/18/2019 09:14  Prostate Specific Ag, Serum Latest Ref Range: 0.0 - 4.0 ng/mL 8.8 (H) 8.5 (H)       70 year old man with:  1.  Advanced prostate cancer with disease to the bone and lymphadenopathy diagnosed in 2013.  He is developing castration-resistant disease at this time.   Treatment options of moving forward were discussed at this time.  He is currently on Zytiga and his PSA is starting to rise rapidly with PSA up to 20 in October 2021.  Treatment options that include Xtandi, systemic chemotherapy for a PARP inhibitor if he harbors appropriate mutation.   After discussion today, we will update his staging scans and tentatively switch to [facility] pending these results.  Kyle George could be also an option if he has predominantly bone disease.    2. Androgen depravation: I recommended continuing this indefinitely.  Long-term complications occluding hot flashes and weight gain were reviewed.   3. Bone health: He is currently on calcium and vitamin D supplements.  Delton See has been deferred at this time till he obtains dental clearance.  5.  Hypertension: Blood pressure is all within normal  range at this time.  6.  Prognosis and goals of care: His disease is incurable although aggressive measures are warranted given his recent fall status.  7. Follow-up: In 4 months for repeat follow-up.   30 minutes were spent on this visit.  The time was dedicated to reviewing his disease status, laboratory data review and future plan of care discussion.  Zola Button, MD 2/4/202211:31 AM

## 2020-02-13 NOTE — Telephone Encounter (Signed)
Oral Oncology Patient Advocate Encounter  Prior Authorization for Kyle George has been approved.    PA# [id number] Effective dates: 02/13/20 through 01/08/21  Patients co-pay is $100  Oral Oncology Clinic will continue to follow.   [place] Patient [place] Phone [phone] Fax [phone] 02/13/2020 1:37 PM

## 2020-02-13 NOTE — Telephone Encounter (Signed)
Oral Oncology Patient Advocate Encounter  Received notification from [facility] that prior authorization for Kyle George is required.  PA submitted on CoverMyMeds Key [id number] Status is pending  Oral Oncology Clinic will continue to follow.   [place] Patient [name] Phone [phone] Fax [phone] 02/13/2020 12:19 PM

## 2020-02-14 LAB — PROSTATE-SPECIFIC AG, SERUM (LABCORP): Prostate Specific Ag, Serum: 27.3 ng/mL — ABNORMAL HIGH (ref 0.0–4.0)

## 2020-02-23 ENCOUNTER — Other Ambulatory Visit: Payer: Self-pay | Admitting: Oncology

## 2020-02-23 MED ORDER — ENZALUTAMIDE 40 MG PO TABS: 160.0000 mg | ORAL_TABLET | Freq: Every day | ORAL | 0 refills | Status: DC

## 2020-02-25 ENCOUNTER — Ambulatory Visit: Payer: Medicare PPO | Admitting: Nurse Practitioner

## 2020-02-25 MED FILL — XTANDI 40 MG TABS: 40 | 30 days supply | Qty: 120 | Fill #0

## 2020-02-25 NOTE — Telephone Encounter (Signed)
Oral Chemotherapy Pharmacist Encounter   Attempted to reach patient to provide update and offer for initial counseling on oral medication: Xtandi (enzalutamide).   No answer. Left voicemail for patient to call back to discuss details of medication acquisition and initial counseling session.  Leron Croak, PharmD, BCPS Hematology/Oncology Clinical Pharmacist [facility] [phone] 02/25/2020 1:48 PM

## 2020-02-25 NOTE — Telephone Encounter (Signed)
Oral Chemotherapy Pharmacist Encounter  I spoke with patient for overview of: Xtandi for the treatment of metastatic, castration-resistant prostate cancer in conjunction with ADT, planned duration until disease progression or unacceptable toxicity.   Counseled patient on administration, dosing, side effects, monitoring, drug-food interactions, safe handling, storage, and disposal.  Patient will take Xtandi 40mg  tablets, 4 tablets (160mg ) by mouth once daily without regard to food.  Xtandi start date: 03/01/20  Adverse effects include but are not limited to: peripheral edema, GI upset, hypertension, hot flashes, fatigue, falls/fractures, and arthralgias.   Patient instructed about small risk of seizures with Xtandi treatment.  Reviewed with patient importance of keeping a medication schedule and plan for any missed doses. No barriers to medication adherence identified.  Medication reconciliation performed and medication/allergy list updated. Patient will finish Zytiga on 02/28/20. Discussed with patient risk for decrease efficacy of amlodipine with concomitant use with Xtandi. Patient knows to monitor for s/sx of increased blood pressure.   Insurance authorization for Gillermina Phy has been obtained. Test claim at the pharmacy revealed copayment $0 for 1st fill of Xtandi. This will ship from the [place] on 02/25/20 to deliver to patient's home on 02/26/20.  Patient informed the pharmacy will reach out 5-7 days prior to needing next fill of Xtandi to coordinate continued medication acquisition to prevent break in therapy.  All questions answered.  Mr. Beale voiced understanding and appreciation.   Medication education handout placed in mail for patient. Patient knows to call the office with questions or concerns. Oral Chemotherapy Clinic phone number provided to patient.   Leron Croak, PharmD, BCPS Hematology/Oncology Clinical Pharmacist [facility] [phone] 02/25/2020 2:16 PM

## 2020-03-09 ENCOUNTER — Other Ambulatory Visit: Payer: Self-pay | Admitting: Nurse Practitioner

## 2020-03-09 DIAGNOSIS — C61 Malignant neoplasm of prostate: Secondary | ICD-10-CM

## 2020-03-14 ENCOUNTER — Other Ambulatory Visit: Payer: Self-pay | Admitting: Oncology

## 2020-03-14 DIAGNOSIS — C61 Malignant neoplasm of prostate: Secondary | ICD-10-CM

## 2020-03-18 ENCOUNTER — Other Ambulatory Visit: Payer: Self-pay | Admitting: Oncology

## 2020-03-18 ENCOUNTER — Telehealth: Payer: Self-pay

## 2020-03-18 DIAGNOSIS — C61 Malignant neoplasm of prostate: Secondary | ICD-10-CM

## 2020-03-18 NOTE — Telephone Encounter (Signed)
Received message that patient was confused about his upcoming scan and had questions about the contrast. TCT patient explaining his appt. dates, times and instructions on NPO and times to drink the contrast. Patient verbalized understanding and promised to stop by the registration desk to pick up contrast at 1:30 pm on his way from work tomorrow Friday 3/11.

## 2020-03-22 ENCOUNTER — Telehealth: Payer: Self-pay | Admitting: *Deleted

## 2020-03-22 NOTE — Telephone Encounter (Signed)
TCT patient regarding an add lab appt to his schedule on 03/24/20 that is now scheduled prior to his scans

## 2020-03-24 ENCOUNTER — Ambulatory Visit: Payer: Medicare PPO | Admitting: Nurse Practitioner

## 2020-03-24 ENCOUNTER — Encounter (HOSPITAL_COMMUNITY)
Admission: RE | Admit: 2020-03-24 | Discharge: 2020-03-24 | Disposition: A | Discharge status: Home / Self Care | Payer: Medicare PPO | Source: Ambulatory Visit | Attending: Oncology | Admitting: Oncology

## 2020-03-24 ENCOUNTER — Other Ambulatory Visit: Payer: Self-pay

## 2020-03-24 ENCOUNTER — Encounter (HOSPITAL_COMMUNITY): Payer: Self-pay

## 2020-03-24 ENCOUNTER — Ambulatory Visit (HOSPITAL_COMMUNITY)
Admission: RE | Admit: 2020-03-24 | Discharge: 2020-03-24 | Disposition: A | Discharge status: Home / Self Care | Payer: Medicare PPO | Source: Ambulatory Visit | Attending: Oncology | Admitting: Oncology

## 2020-03-24 ENCOUNTER — Encounter: Payer: Self-pay | Admitting: Nurse Practitioner

## 2020-03-24 ENCOUNTER — Inpatient Hospital Stay: Payer: Medicare PPO | Attending: Oncology

## 2020-03-24 VITALS — BP 122/76 | HR 79 | Temp 98.0°F | Ht 67.0 in | Wt 212.0 lb

## 2020-03-24 DIAGNOSIS — Z0001 Encounter for general adult medical examination with abnormal findings: Secondary | ICD-10-CM

## 2020-03-24 DIAGNOSIS — C7951 Secondary malignant neoplasm of bone: Secondary | ICD-10-CM

## 2020-03-24 DIAGNOSIS — I10 Essential (primary) hypertension: Secondary | ICD-10-CM

## 2020-03-24 DIAGNOSIS — Z720 Tobacco use: Secondary | ICD-10-CM

## 2020-03-24 DIAGNOSIS — I2510 Atherosclerotic heart disease of native coronary artery without angina pectoris: Secondary | ICD-10-CM | POA: Diagnosis not present

## 2020-03-24 DIAGNOSIS — E119 Type 2 diabetes mellitus without complications: Secondary | ICD-10-CM

## 2020-03-24 DIAGNOSIS — C61 Malignant neoplasm of prostate: Secondary | ICD-10-CM | POA: Diagnosis not present

## 2020-03-24 DIAGNOSIS — N281 Cyst of kidney, acquired: Secondary | ICD-10-CM | POA: Diagnosis not present

## 2020-03-24 DIAGNOSIS — R9431 Abnormal electrocardiogram [ECG] [EKG]: Secondary | ICD-10-CM

## 2020-03-24 DIAGNOSIS — J432 Centrilobular emphysema: Secondary | ICD-10-CM | POA: Diagnosis not present

## 2020-03-24 DIAGNOSIS — I358 Other nonrheumatic aortic valve disorders: Secondary | ICD-10-CM | POA: Diagnosis not present

## 2020-03-24 DIAGNOSIS — Z23 Encounter for immunization: Secondary | ICD-10-CM | POA: Diagnosis not present

## 2020-03-24 DIAGNOSIS — S42001A Fracture of unspecified part of right clavicle, initial encounter for closed fracture: Secondary | ICD-10-CM | POA: Diagnosis not present

## 2020-03-24 DIAGNOSIS — E782 Mixed hyperlipidemia: Secondary | ICD-10-CM

## 2020-03-24 DIAGNOSIS — Z0000 Encounter for general adult medical examination without abnormal findings: Secondary | ICD-10-CM

## 2020-03-24 LAB — POCT URINALYSIS DIPSTICK
Bilirubin, UA: NEGATIVE
Glucose, UA: NEGATIVE
Ketones, UA: NEGATIVE
Leukocytes, UA: NEGATIVE
Nitrite, UA: NEGATIVE
Protein, UA: NEGATIVE
Spec Grav, UA: 1.01 (ref 1.010–1.025)
Urobilinogen, UA: 0.2 E.U./dL
pH, UA: 5.5 (ref 5.0–8.0)

## 2020-03-24 LAB — CBC WITH DIFFERENTIAL (CANCER CENTER ONLY)
Abs Immature Granulocytes: 0.02 10*3/uL (ref 0.00–0.07)
Basophils Absolute: 0.1 10*3/uL (ref 0.0–0.1)
Basophils Relative: 1 %
Eosinophils Absolute: 0.3 10*3/uL (ref 0.0–0.5)
Eosinophils Relative: 4 %
HCT: 41.9 % (ref 39.0–52.0)
Hemoglobin: 13.8 g/dL (ref 13.0–17.0)
Immature Granulocytes: 0 %
Lymphocytes Relative: 17 %
Lymphs Abs: 1.3 10*3/uL (ref 0.7–4.0)
MCH: 33.7 pg (ref 26.0–34.0)
MCHC: 32.9 g/dL (ref 30.0–36.0)
MCV: 102.4 fL — ABNORMAL HIGH (ref 80.0–100.0)
Monocytes Absolute: 0.5 10*3/uL (ref 0.1–1.0)
Monocytes Relative: 7 %
Neutro Abs: 5.4 10*3/uL (ref 1.7–7.7)
Neutrophils Relative %: 71 %
Platelet Count: 178 10*3/uL (ref 150–400)
RBC: 4.09 MIL/uL — ABNORMAL LOW (ref 4.22–5.81)
RDW: 13.9 % (ref 11.5–15.5)
WBC Count: 7.6 10*3/uL (ref 4.0–10.5)
nRBC: 0 % (ref 0.0–0.2)

## 2020-03-24 LAB — CMP (CANCER CENTER ONLY)
ALT: 10 U/L (ref 0–44)
AST: 13 U/L — ABNORMAL LOW (ref 15–41)
Albumin: 3.4 g/dL — ABNORMAL LOW (ref 3.5–5.0)
Alkaline Phosphatase: 66 U/L (ref 38–126)
Anion gap: 9 (ref 5–15)
BUN: 17 mg/dL (ref 8–23)
CO2: 25 mmol/L (ref 22–32)
Calcium: 9.3 mg/dL (ref 8.9–10.3)
Chloride: 105 mmol/L (ref 98–111)
Creatinine: 1.03 mg/dL (ref 0.61–1.24)
GFR, Estimated: >60 mL/min (ref >60)
Glucose, Bld: 88 mg/dL (ref 70–99)
Potassium: 4 mmol/L (ref 3.5–5.1)
Sodium: 139 mmol/L (ref 135–145)
Total Bilirubin: 0.3 mg/dL (ref 0.3–1.2)
Total Protein: 7.5 g/dL (ref 6.5–8.1)

## 2020-03-24 LAB — POCT UA - MICROALBUMIN
Albumin/Creatinine Ratio, Urine, POC: 30
Creatinine, POC: 200 mg/dL
Microalbumin Ur, POC: 30 mg/L

## 2020-03-24 MED ORDER — TECHNETIUM TC 99M MEDRONATE IV KIT
21.1000 | PACK | Freq: Once | INTRAVENOUS | Status: AC | PRN
  Administered 2020-03-24: 21.1 via INTRAVENOUS

## 2020-03-24 MED ORDER — BLOOD GLUCOSE MONITOR KIT: PACK | 0 refills | Status: DC

## 2020-03-24 MED ORDER — PNEUMOCOCCAL 13-VAL CONJ VACC IM SUSP: 0.5000 mL | INTRAMUSCULAR | 0 refills | Status: AC

## 2020-03-24 MED ORDER — IOHEXOL 300 MG/ML  SOLN
100.0000 mL | Freq: Once | INTRAMUSCULAR | Status: AC | PRN
  Administered 2020-03-24: 100 mL via INTRAVENOUS

## 2020-03-24 NOTE — Progress Notes (Signed)
I,[name] [employer] as a Education administrator for [employer], [name].,have documented all relevant documentation on the behalf of [name], [name],as directed by  [name], [name] while in the presence of [name], [place]. This visit occurred during the SARS-CoV-2 public health emergency.  Safety protocols were in place, including screening questions prior to the visit, additional usage of staff PPE, and extensive cleaning of exam room while observing appropriate contact time as indicated for disinfecting solutions.  Subjective:     Patient ID: Kyle George , male    DOB: 12/19/1950 , 70 y.o.   MRN: [id number]   Chief Complaint  Patient presents with  . Annual Exam    HPI  Patient here for hm Hypertension This is a chronic problem. The current episode started more than 1 year ago. The problem is unchanged. The problem is controlled. Pertinent negatives include no chest pain, headaches, malaise/fatigue, palpitations or shortness of breath. There are no associated agents to hypertension. There are no known risk factors for coronary artery disease. Past treatments include ACE inhibitors. There are no compliance problems.  There is no history of kidney disease. There is no history of chronic renal disease.  Diabetes He presents for his follow-up diabetic visit. He has type 2 diabetes mellitus. There are no hypoglycemic associated symptoms. Pertinent negatives for hypoglycemia include no dizziness or headaches. There are no diabetic associated symptoms. Pertinent negatives for diabetes include no chest pain, no fatigue, no polydipsia, no polyphagia and no polyuria. There are no hypoglycemic complications. There are no diabetic complications. Risk factors for coronary artery disease include male sex and sedentary lifestyle. Current diabetic treatment includes oral agent (dual therapy). He is compliant with treatment some of the time. He is following a generally healthy diet. When asked about meal  planning, he reported none. He has not had a previous visit with a dietitian. Exercise: he works part time as a Retail buyer. (Does not check his blood sugar at home. He does not have a machine)     Past Medical History:  Diagnosis Date  . Cancer of prostate ([place])   . Diabetes mellitus without complication ([place])   . Hyperlipidemia   . Hypertension      Family History  Problem Relation Age of Onset  . Hypertension Mother   . Diabetes Mother   . Cancer Father   . Colon cancer Neg Hx   . Esophageal cancer Neg Hx   . Stomach cancer Neg Hx   . Rectal cancer Neg Hx      Current Outpatient Medications:  .  aspirin 81 MG tablet, Take 81 mg by mouth daily., Disp: , Rfl:  .  B-D ULTRAFINE III SHORT PEN 31G X 8 MM MISC, USE AS DIRECTED WITH LEVEMIR PEN, Disp: 100 each, Rfl: 5 .  blood glucose meter kit and supplies KIT, Dispense based on patient and insurance preference. Use up to four times daily as directed., Disp: 1 each, Rfl: 0 .  Calcium Carbonate-Vitamin D (CALCIUM 600+D PO), Take 1 tablet by mouth daily., Disp: , Rfl:  .  carvedilol (COREG) 6.25 MG tablet, TAKE 1 TABLET BY MOUTH TWICE A DAY WITH FOOD, Disp: 180 tablet, Rfl: 0 .  cholecalciferol (VITAMIN D) 1000 UNITS tablet, Take 5,000 Units by mouth daily., Disp: , Rfl:  .  glucose blood test strip, 1 each by Other route as needed for other. Use as instructed to check blood sugars, Disp: , Rfl:  .  insulin detemir (LEVEMIR FLEXTOUCH) 100 UNIT/ML FlexPen,  Inject 15 Units into the skin daily., Disp: 15 mL, Rfl: 1 .  Insulin Syringe-Needle U-100 31G X 5/16" 0.3 ML MISC, by Does not apply route. Use as directed with levemir pen, Disp: , Rfl:  .  JANUVIA 100 MG tablet, TAKE 1 TABLET BY MOUTH EVERY DAY, Disp: 90 tablet, Rfl: 0 .  Lancets (ONETOUCH DELICA PLUS [id number]) MISC, by Does not apply route. Use as directed to check blood  Sugars 2 times per day dx E11.65, Disp: , Rfl:  .  Multiple Vitamin (MULTIVITAMIN WITH MINERALS) TABS, Take 1  tablet by mouth daily., Disp: , Rfl:  .  Olmesartan-amLODIPine-HCTZ 40-10-25 MG TABS, TAKE 1 TABLET BY MOUTH EVERY DAY, Disp: 30 tablet, Rfl: 2 .  Omega-3 Fatty Acids (FISH OIL PO), Take 1 tablet by mouth once., Disp: , Rfl:  .  pioglitazone-metformin (ACTOPLUS MET) 15-850 MG tablet, Take 1 tablet by mouth 2 (two) times daily., Disp: 180 tablet, Rfl: 1 .  pneumococcal 13-valent conjugate vaccine (PREVNAR 13) SUSP injection, Inject 0.5 mLs into the muscle tomorrow at 10 am for 1 dose., Disp: 0.5 mL, Rfl: 0 .  predniSONE (DELTASONE) 5 MG tablet, TAKE 1 TABLET BY MOUTH EVERY DAY WITH BREAKFAST, Disp: 30 tablet, Rfl: 1 .  rosuvastatin (CRESTOR) 10 MG tablet, Take 1 tablet (10 mg total) by mouth daily., Disp: 90 tablet, Rfl: 1 .  spironolactone (ALDACTONE) 25 MG tablet, TAKE 1 TABLET BY MOUTH EVERY DAY, Disp: 90 tablet, Rfl: 1 .  XTANDI 40 MG tablet, TAKE 4 TABLETS (160 MG TOTAL) BY MOUTH DAILY., Disp: 120 tablet, Rfl: 0 .  Blood Pressure Monitoring (BLOOD PRESSURE CUFF) MISC, by Does not apply route. Check blood pressure at least 4 times per week 3 hours after taking medication (Patient not taking: No sig reported), Disp: , Rfl:  .  NON FORMULARY, Take 1 tablet by mouth daily. Thyroid  action (Patient not taking: No sig reported), Disp: , Rfl:    No Known Allergies   Men's preventive visit. Patient Health Questionnaire (PHQ-2) is  Flowsheet Row Clinical Support from 03/20/2019 in [facility]  PHQ-2 Total Score 0    . Patient is on a regular diet. Marital status: Married. Relevant history for alcohol use is:  Social History   Substance and Sexual Activity  Alcohol Use Yes   Comment: occassionally  . Relevant history for tobacco use is:  Social History   Tobacco Use  Smoking Status Current Some Day Smoker  . Packs/day: 1.00  . Years: 15.00  . Pack years: 15.00  . Types: Cigarettes  Smokeless Tobacco Former Systems developer  . Types: Chew  . Quit date: 1975  Tobacco Comment    smoked about 1/2 PPD for about 10 years; no at 4-5 cigarettes per day   .   Review of Systems  Constitutional: Negative.  Negative for fatigue and malaise/fatigue.  HENT: Negative.   Eyes: Negative.   Respiratory: Negative.  Negative for shortness of breath.   Cardiovascular: Negative.  Negative for chest pain and palpitations.  Gastrointestinal: Negative.   Endocrine: Negative.  Negative for polydipsia, polyphagia and polyuria.  Genitourinary: Negative.   Musculoskeletal: Negative.   Skin: Negative.   Neurological: Negative.  Negative for dizziness and headaches.  Hematological: Negative.   Psychiatric/Behavioral: Negative.      Today's Vitals   03/24/20 1527  BP: 122/76  Pulse: 79  Temp: 98 F (36.7 C)  Weight: 212 lb (96.2 kg)  Height: '5\' 7"'  (1.702 m)  PainSc: 0-No  pain   Body mass index is 33.2 kg/m.   Objective:  Physical Exam Constitutional:      General: He is not in acute distress.    Appearance: Normal appearance.  Cardiovascular:     Rate and Rhythm: Normal rate and regular rhythm.     Pulses: Normal pulses.     Heart sounds: Normal heart sounds.  Pulmonary:     Effort: Pulmonary effort is normal.     Breath sounds: Normal breath sounds.  Abdominal:     General: Abdomen is flat. Bowel sounds are normal.     Palpations: Abdomen is soft.  Musculoskeletal:        General: Normal range of motion.     Cervical back: Normal range of motion and neck supple.  Skin:    General: Skin is warm and dry.     Capillary Refill: Capillary refill takes less than 2 seconds.  Neurological:     General: No focal deficit present.     Mental Status: He is alert and oriented to person, place, and time.  Psychiatric:        Mood and Affect: Mood normal.        Behavior: Behavior normal.        Thought Content: Thought content normal.        Judgment: Judgment normal.         Assessment And Plan:    1. Encounter for general adult medical examination w/o abnormal  findings . Behavior modifications discussed and diet history reviewed.   . Pt will continue to exercise regularly and modify diet with low GI, plant based foods and decrease intake of processed foods.  . Recommend intake of daily multivitamin, Vitamin D, and calcium.  . Recommend colonoscopy for preventive screenings, as well as recommend immunizations that include influenza, TDAP, and Shingles  2. Essential hypertension . B/P is well controlled.  . CMP ordered to check renal function.  . The importance of regular exercise and dietary modification was stressed to the patient.  . Stressed importance of losing ten percent of her body weight to help with B/P control.  . The weight loss would help with decreasing cardiac and cancer risk as well.  . 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 Type 2 diabetes mellitus without complication, without long-term current use of insulin (HCC)  Chronic, stable in prediabetic range  Continue with current medications  Encouraged to limit intake of sugary foods and drinks  Encouraged to increase physical activity to 150 minutes per week  Diabetic foot exam done, Bilateral feet are extremely dry.  Encouraged to make sure he moisturizes his feet  Eye exam is not up to date and he is advised to schedule his eye exam  - blood glucose meter kit and supplies KIT; Dispense based on patient and insurance preference. Use up to four times daily as directed.  Dispense: 1 each; Refill: 0 - AMB Referral to [facility] Coordin[facility] - Hemoglobin A1c  4. Mixed hyperlipidemia  Chronic, controlled  Continue with current medications, tolerating medications well - Lipid panel - CMP14+EGFR  5. 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 Tobacco  abuse - CT CHEST LUNG CA SCREEN LOW DOSE W/O CM; Future  8. Encounter for immunization  Rx sent to pharmacy - pneumococcal 13-valent conjugate vaccine (PREVNAR 13) SUSP injection; Inject 0.5 mLs into the muscle tomorrow at 10 am for 1 dose.  Dispense: 0.5 mL; Refill: 0  9. Prostate cancer [facility])  Continue follow up with Oncology     Patient was given opportunity to ask questions. Patient verbalized understanding of the plan and was able to repeat key elements of the plan. All questions were answered to their satisfaction.   [name], [name]   I, [name], [name], have reviewed all documentation for this visit. The documentation on 03/24/20 for the exam, diagnosis, procedures, and orders are all accurate and complete.  THE PATIENT IS ENCOURAGED TO PRACTICE SOCIAL DISTANCING DUE TO THE COVID-19 PANDEMIC.

## 2020-03-24 NOTE — Patient Instructions (Signed)

## 2020-03-25 ENCOUNTER — Telehealth: Payer: Self-pay | Admitting: *Deleted

## 2020-03-25 LAB — LIPID PANEL
Chol/HDL Ratio: 1.9 ratio (ref 0.0–5.0)
Cholesterol, Total: 116 mg/dL (ref 100–199)
HDL: 60 mg/dL (ref >39)
LDL Chol Calc (NIH): 41 mg/dL (ref 0–99)
Triglycerides: 76 mg/dL (ref 0–149)
VLDL Cholesterol Cal: 15 mg/dL (ref 5–40)

## 2020-03-25 LAB — CMP14+EGFR
ALT: 9 IU/L (ref 0–44)
AST: 14 IU/L (ref 0–40)
Albumin/Globulin Ratio: 1.4 (ref 1.2–2.2)
Albumin: 4.2 g/dL (ref 3.7–4.7)
Alkaline Phosphatase: 72 IU/L (ref 44–121)
BUN/Creatinine Ratio: 13 (ref 10–24)
BUN: 15 mg/dL (ref 8–27)
Bilirubin Total: 0.2 mg/dL (ref 0.0–1.2)
CO2: 23 mmol/L (ref 20–29)
Calcium: 9.6 mg/dL (ref 8.6–10.2)
Chloride: 102 mmol/L (ref 96–106)
Creatinine, Ser: 1.17 mg/dL (ref 0.76–1.27)
Globulin, Total: 3 g/dL (ref 1.5–4.5)
Glucose: 105 mg/dL — ABNORMAL HIGH (ref 65–99)
Potassium: 3.9 mmol/L (ref 3.5–5.2)
Sodium: 144 mmol/L (ref 134–144)
Total Protein: 7.2 g/dL (ref 6.0–8.5)
eGFR: 67 mL/min/{1.73_m2} (ref >59)

## 2020-03-25 LAB — PROSTATE-SPECIFIC AG, SERUM (LABCORP): Prostate Specific Ag, Serum: 0.8 ng/mL (ref 0.0–4.0)

## 2020-03-25 LAB — HEMOGLOBIN A1C
Est. average glucose Bld gHb Est-mCnc: 126 mg/dL
Hgb A1c MFr Bld: 6 % — ABNORMAL HIGH (ref 4.8–5.6)

## 2020-03-25 NOTE — Chronic Care Management (AMB) (Signed)
        Chronic Care Management   Outreach Note  03/25/2020 Name: Kyle George MRN: [id number] DOB: 18-Oct-1950  Kyle George is a 70 y.o. year old male who is a primary care patient of [name], [place]. I reached out to Kyle George by phone today in response to a referral sent by Kyle George's PCP, [name], [name]     An unsuccessful telephone outreach was attempted today. The patient was referred to the case management team for assistance with care management and care coordination.   Follow Up Plan: A HIPAA compliant phone message was left for the patient providing contact information and requesting a return call.  The care management team will reach out to the patient again over the next 7 days. If patient returns call to provider office, please advise to call [place] Lysle Morales at [phone].  [facility]

## 2020-03-28 ENCOUNTER — Encounter: Payer: Self-pay | Admitting: Nurse Practitioner

## 2020-03-31 NOTE — Chronic Care Management (AMB) (Signed)
  Chronic Care Management   Outreach Note  03/31/2020 Name: ENNIS HEAVNER MRN: [id number] DOB: 1950/03/19  Alvina Filbert Holster is a 70 y.o. year old male who is a primary care patient of [name], [place]. I reached out to [name] by phone today in response to a referral sent by Mr. Rad Gramling Longino's PCP, Laurance Flatten.Doreene Burke, [name]     A second unsuccessful telephone outreach was attempted today. The patient was referred to the case management team for assistance with care management and care coordination.   Follow Up Plan: A HIPAA compliant phone message was left for the patient providing contact information and requesting a return call.  The care management team will reach out to the patient again over the next 7 days.  If patient returns call to provider office, please advise to call [place] Lysle Morales at [facility]

## 2020-04-06 NOTE — Chronic Care Management (AMB) (Signed)
  Chronic Care Management   Outreach Note  04/06/2020 Name: Kyle George MRN: [id number] DOB: [birth year]  [name] is a 70 y.o. year old male who is a primary care patient of [name], [place]. I reached out to [name] by phone today in response to a referral sent by Mr. Toryn Mcclinton Lopezgarcia's PCP, [name], [name]     Third unsuccessful telephone outreach was attempted today. The patient was referred to the case management team for assistance with care management and care coordination. The patient's primary care provider has been notified of our unsuccessful attempts to make or maintain contact with the patient. The care management team is pleased to engage with this patient at any time in the future should he/she be interested in assistance from the care management team.   Follow Up Plan: A HIPAA compliant phone message was left for the patient providing contact information and requesting a return call. We have been unable to make contact with the patient . The care management team is available to follow up with the patient after provider conversation with the patient regarding recommendation for care management engagement and subsequent re-referral to the care management team.  If patient returns call to provider office, please advise to call [place] Lysle Morales at [facility]

## 2020-04-08 ENCOUNTER — Other Ambulatory Visit (HOSPITAL_COMMUNITY): Payer: Self-pay

## 2020-04-13 ENCOUNTER — Other Ambulatory Visit: Payer: Self-pay | Admitting: Nurse Practitioner

## 2020-04-13 DIAGNOSIS — E119 Type 2 diabetes mellitus without complications: Secondary | ICD-10-CM

## 2020-04-13 DIAGNOSIS — C61 Malignant neoplasm of prostate: Secondary | ICD-10-CM

## 2020-04-19 ENCOUNTER — Other Ambulatory Visit: Payer: Self-pay | Admitting: Oncology

## 2020-04-19 ENCOUNTER — Other Ambulatory Visit (HOSPITAL_COMMUNITY): Payer: Self-pay

## 2020-04-19 DIAGNOSIS — C61 Malignant neoplasm of prostate: Secondary | ICD-10-CM

## 2020-04-20 ENCOUNTER — Other Ambulatory Visit (HOSPITAL_COMMUNITY): Payer: Self-pay

## 2020-04-20 MED ORDER — ENZALUTAMIDE 40 MG PO TABS
ORAL_TABLET | ORAL | 0 refills | Status: DC
  Filled 2020-04-20: qty 120, 30d supply, fill #0

## 2020-04-22 ENCOUNTER — Other Ambulatory Visit (HOSPITAL_COMMUNITY): Payer: Self-pay

## 2020-04-29 ENCOUNTER — Inpatient Hospital Stay: Payer: Medicare PPO | Attending: Oncology

## 2020-04-29 ENCOUNTER — Other Ambulatory Visit: Payer: Self-pay

## 2020-04-29 ENCOUNTER — Inpatient Hospital Stay: Payer: Medicare PPO | Admitting: Oncology

## 2020-04-29 VITALS — BP 126/60 | HR 71 | Temp 98.2°F | Resp 18 | Wt 209.8 lb

## 2020-04-29 DIAGNOSIS — Z79899 Other long term (current) drug therapy: Secondary | ICD-10-CM | POA: Insufficient documentation

## 2020-04-29 DIAGNOSIS — C61 Malignant neoplasm of prostate: Secondary | ICD-10-CM

## 2020-04-29 DIAGNOSIS — Z192 Hormone resistant malignancy status: Secondary | ICD-10-CM | POA: Diagnosis not present

## 2020-04-29 DIAGNOSIS — I10 Essential (primary) hypertension: Secondary | ICD-10-CM | POA: Insufficient documentation

## 2020-04-29 DIAGNOSIS — Z7982 Long term (current) use of aspirin: Secondary | ICD-10-CM | POA: Diagnosis not present

## 2020-04-29 LAB — CBC WITH DIFFERENTIAL (CANCER CENTER ONLY)
Abs Immature Granulocytes: 0.02 10*3/uL (ref 0.00–0.07)
Basophils Absolute: 0.1 10*3/uL (ref 0.0–0.1)
Basophils Relative: 1 %
Eosinophils Absolute: 0.4 10*3/uL (ref 0.0–0.5)
Eosinophils Relative: 6 %
HCT: 38.8 % — ABNORMAL LOW (ref 39.0–52.0)
Hemoglobin: 12.9 g/dL — ABNORMAL LOW (ref 13.0–17.0)
Immature Granulocytes: 0 %
Lymphocytes Relative: 22 %
Lymphs Abs: 1.5 10*3/uL (ref 0.7–4.0)
MCH: 33.8 pg (ref 26.0–34.0)
MCHC: 33.2 g/dL (ref 30.0–36.0)
MCV: 101.6 fL — ABNORMAL HIGH (ref 80.0–100.0)
Monocytes Absolute: 0.5 10*3/uL (ref 0.1–1.0)
Monocytes Relative: 8 %
Neutro Abs: 4.3 10*3/uL (ref 1.7–7.7)
Neutrophils Relative %: 63 %
Platelet Count: 167 10*3/uL (ref 150–400)
RBC: 3.82 MIL/uL — ABNORMAL LOW (ref 4.22–5.81)
RDW: 13.6 % (ref 11.5–15.5)
WBC Count: 6.8 10*3/uL (ref 4.0–10.5)
nRBC: 0 % (ref 0.0–0.2)

## 2020-04-29 LAB — CMP (CANCER CENTER ONLY)
ALT: <6 U/L (ref 0–44)
AST: 12 U/L — ABNORMAL LOW (ref 15–41)
Albumin: 3.5 g/dL (ref 3.5–5.0)
Alkaline Phosphatase: 72 U/L (ref 38–126)
Anion gap: 12 (ref 5–15)
BUN: 21 mg/dL (ref 8–23)
CO2: 26 mmol/L (ref 22–32)
Calcium: 9.3 mg/dL (ref 8.9–10.3)
Chloride: 104 mmol/L (ref 98–111)
Creatinine: 1.02 mg/dL (ref 0.61–1.24)
GFR, Estimated: >60 mL/min (ref >60)
Glucose, Bld: 85 mg/dL (ref 70–99)
Potassium: 3.9 mmol/L (ref 3.5–5.1)
Sodium: 142 mmol/L (ref 135–145)
Total Bilirubin: 0.3 mg/dL (ref 0.3–1.2)
Total Protein: 7.3 g/dL (ref 6.5–8.1)

## 2020-04-29 NOTE — Progress Notes (Signed)
Hematology and Oncology Follow Up Visit  Kyle George [id number] [birth year] 70 y.o. 04/29/2020 2:41 PM Kyle George, [place], [name], Kyle George   Principle Diagnosis: 70 year old man with advanced prostate cancer with lymphadenopathy diagnosed in 2013.  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  Prior Therapy: Status post androgen deprivation and initially with Kyle George subsequently with Kyle George with a PSA dropping down to 500 and December 2015.  He failed to follow-up with the PSA rising up to 1400 and April 2018.   Kyle George 1000 mg daily with prednisone at 5 mg daily started in August 2018.   Current therapy:  Kyle George 30 mg every 4 months.  He received Kyle George in February and will be repeated in June 2022.  Kyle George 160 mg daily started on March 01, 2020.  Interim History:  Kyle George returns today for repeat evaluation.  Since the last visit, he started Kyle George without any complications.  He denies any nausea, fatigue or bone pain.  He denies any recent hospitalization or illnesses.  He continues to be active and works full-time.  He denies any bone pain or pathological fractures.  He denies any urinary complaints.     Medications: Unchanged on review. Current Outpatient Medications  Medication Sig Dispense Refill  . aspirin 81 MG tablet Take 81 mg by mouth daily.    . B-D ULTRAFINE III SHORT PEN 31G X 8 MM MISC USE AS DIRECTED WITH LEVEMIR PEN 100 each 5  . blood glucose meter kit and supplies KIT Dispense based on patient and insurance preference. Use up to four times daily as directed. 1 each 0  . Blood Pressure Monitoring (BLOOD PRESSURE CUFF) MISC by Does not apply route. Check blood pressure at least 4 times per week 3 hours after taking medication (Patient not taking: No sig reported)    . Calcium Carbonate-Vitamin D (CALCIUM 600+D PO) Take 1 tablet by mouth daily.    . carvedilol (COREG) 6.25 MG tablet TAKE 1 TABLET BY MOUTH TWICE  A DAY WITH FOOD 180 tablet 0  . cholecalciferol (VITAMIN D) 1000 UNITS tablet Take 5,000 Units by mouth daily.    . enzalutamide (Kyle George) 40 MG tablet TAKE 4 TABLETS (160 MG TOTAL) BY MOUTH DAILY. 120 tablet 0  . glucose blood test strip 1 each by Other route as needed for other. Use as instructed to check blood sugars    . insulin detemir (LEVEMIR FLEXTOUCH) 100 UNIT/ML FlexPen Inject 15 Units into the skin daily. 15 mL 1  . Insulin Syringe-Needle U-100 31G X 5/16" 0.3 ML MISC by Does not apply route. Use as directed with levemir pen    . JANUVIA 100 MG tablet TAKE 1 TABLET BY MOUTH EVERY DAY 90 tablet 0  . Lancets (ONETOUCH DELICA PLUS [id number]) MISC by Does not apply route. Use as directed to check blood  Sugars 2 times per day dx E11.65    . Multiple Vitamin (MULTIVITAMIN WITH MINERALS) TABS Take 1 tablet by mouth daily.    . NON FORMULARY Take 1 tablet by mouth daily. Thyroid  action (Patient not taking: No sig reported)    . Olmesartan-amLODIPine-HCTZ 40-10-25 MG TABS TAKE 1 TABLET BY MOUTH EVERY DAY 90 tablet 1  . Omega-3 Fatty Acids (FISH OIL PO) Take 1 tablet by mouth once.    . pioglitazone-metformin (ACTOPLUS MET) 15-850 MG tablet TAKE 1 TABLET BY MOUTH TWICE A DAY 180 tablet 1  . predniSONE (DELTASONE) 5 MG tablet TAKE  1 TABLET BY MOUTH EVERY DAY WITH BREAKFAST 30 tablet 1  . rosuvastatin (CRESTOR) 10 MG tablet Take 1 tablet (10 mg total) by mouth daily. 90 tablet 1  . spironolactone (ALDACTONE) 25 MG tablet TAKE 1 TABLET BY MOUTH EVERY DAY 90 tablet 1   No current facility-administered medications for this visit.     Allergies:  No Known Allergies     Physical Exam:   Blood pressure 126/60, pulse 71, temperature 98.2 F (36.8 C), temperature source Tympanic, resp. rate 18, weight 209 lb 12.8 oz (95.2 kg), SpO2 100 %.      ECOG: 0    General appearance: Comfortable appearing without any discomfort Head: Normocephalic without any trauma Oropharynx: Mucous  membranes are moist and pink without any thrush or ulcers. Eyes: Pupils are equal and round reactive to light. Lymph nodes: No cervical, supraclavicular, inguinal or axillary lymphadenopathy.   Heart:regular rate and rhythm.  S1 and S2 without leg edema. Lung: Clear without any rhonchi or wheezes.  No dullness to percussion. Abdomin: Soft, nontender, nondistended with good bowel sounds.  No hepatosplenomegaly. Musculoskeletal: No joint deformity or effusion.  Full range of motion noted. Neurological: No deficits noted on motor, sensory and deep tendon reflex exam. Skin: No petechial rash or dryness.  Appeared moist.            Lab Results: Lab Results  Component Value Date   WBC 7.6 03/24/2020   HGB 13.8 03/24/2020   HCT 41.9 03/24/2020   MCV 102.4 (H) 03/24/2020   PLT 178 03/24/2020                  Results for Kyle George, Kyle George (MRN [id number]) as of 04/29/2020 14:42  Ref. Range 10/17/2019 11:42 02/13/2020 11:21 03/24/2020 08:46  Prostate Specific Ag, Serum Latest Ref Range: 0.0 - 4.0 ng/mL 20.3 (H) 27.3 (H) 0.56        70 year old man with:  1.  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    His disease status was updated at this time and laboratory data reviewed.  His PSA showed a reasonable response after switching from [place] to [place].  His PSA on March 16 was 0.8.  Risks and benefits of continuing this treatment were discussed at this time.  Potential complication clinic hypertension, fatigue and tiredness among others.  Future treatment options including systemic chemotherapy, Xofigo, PARP inhibitor among others were reiterated.   He is agreeable to continue at this time and will update his PSA from today.    2. Androgen depravation: This will be repeated in June 2022.   3. Bone health: I recommended calcium and vitamin D supplements.  Delton See has been deferred till dental clearance.  5.   Hypertension: His blood pressure is within normal range and will monitor on Kyle George.  6.  Prognosis and goals of care: Therapy remains palliative although aggressive measures are warranted given his excellent performance status.  7. Follow-up: He will return in June for repeat follow-up.   30 minutes were dedicated to this encounter.  The time was spent on reviewing laboratory data, disease status update and future treatment options.  Zola Button, MD 4/21/20222:41 PM

## 2020-04-30 ENCOUNTER — Telehealth: Payer: Self-pay | Admitting: *Deleted

## 2020-04-30 LAB — PROSTATE-SPECIFIC AG, SERUM (LABCORP): Prostate Specific Ag, Serum: 0.1 ng/mL (ref 0.0–4.0)

## 2020-04-30 NOTE — Telephone Encounter (Signed)
-----   Message from Wyatt Portela, MD sent at 04/30/2020  8:40 AM EDT ----- Please let him know his PSA is coming down again.

## 2020-04-30 NOTE — Telephone Encounter (Signed)
PC to patient, informed him PSA is <0.1   Patient verbalizes understanding.

## 2020-05-08 DIAGNOSIS — C61 Malignant neoplasm of prostate: Secondary | ICD-10-CM | POA: Diagnosis not present

## 2020-05-10 ENCOUNTER — Encounter: Payer: Self-pay | Admitting: Oncology

## 2020-05-12 LAB — GUARDANT 360

## 2020-05-15 ENCOUNTER — Other Ambulatory Visit: Payer: Self-pay | Admitting: Oncology

## 2020-05-15 DIAGNOSIS — C61 Malignant neoplasm of prostate: Secondary | ICD-10-CM

## 2020-05-20 ENCOUNTER — Other Ambulatory Visit (HOSPITAL_COMMUNITY): Payer: Self-pay

## 2020-05-24 ENCOUNTER — Other Ambulatory Visit (HOSPITAL_COMMUNITY): Payer: Self-pay

## 2020-05-24 ENCOUNTER — Other Ambulatory Visit: Payer: Self-pay | Admitting: *Deleted

## 2020-05-24 ENCOUNTER — Other Ambulatory Visit: Payer: Self-pay | Admitting: Oncology

## 2020-05-24 DIAGNOSIS — C61 Malignant neoplasm of prostate: Secondary | ICD-10-CM

## 2020-05-24 MED ORDER — ENZALUTAMIDE 40 MG PO TABS
ORAL_TABLET | ORAL | 0 refills | Status: DC
  Filled 2020-05-24: qty 120, 30d supply, fill #0

## 2020-05-25 ENCOUNTER — Other Ambulatory Visit (HOSPITAL_COMMUNITY): Payer: Self-pay

## 2020-05-27 ENCOUNTER — Ambulatory Visit (INDEPENDENT_AMBULATORY_CARE_PROVIDER_SITE_OTHER): Payer: Medicare PPO

## 2020-05-27 VITALS — Ht 68.0 in | Wt 210.0 lb

## 2020-05-27 DIAGNOSIS — Z0000 Encounter for general adult medical examination without abnormal findings: Secondary | ICD-10-CM

## 2020-05-27 NOTE — Progress Notes (Signed)
I connected with  Kyle George today via telehealth video enabled device and verified that I am speaking with the correct person using two identifiers.   Location: Patient: home Provider: work  Persons participating in virtual visit: Kyle George, Kyle Durand LPN  I discussed the limitations, risks, security and privacy concerns of performing an evaluation and management service by telephone and the availability of in person appointments. The patient expressed understanding and agreed to proceed.   Some vital signs may be absent or patient reported.      Subjective:   Kyle George is a 70 y.o. male who presents for Medicare Annual/Subsequent preventive examination.  Review of Systems     Cardiac Risk Factors include: advanced age (>40mn, >>43women);diabetes mellitus;dyslipidemia;hypertension;male gender;smoking/ tobacco exposure;obesity (BMI >30kg/m2)     Objective:    Today's Vitals   05/27/20 1611 05/27/20 1612  Weight: 210 lb (95.3 kg)   Height: _0  (1.727 m)   PainSc:  3    Body mass index is 31.93 kg/m.  Advanced Directives 05/27/2020 10/17/2019 06/18/2019 03/20/2019 02/18/2019 12/03/2018 11/07/2017  Does Patient Have a Medical Advance Directive? _1  No No  Would patient like information on creating a medical advance directive? - No - Patient declined - Yes (MAU/Ambulatory/Procedural Areas - Information given) No - Patient declined No - Patient declined Yes (MAU/Ambulatory/Procedural Areas - Information given)  Pre-existing out of facility DNR order (yellow form or pink MOST form) - - - - - - -    Current Medications (verified) Outpatient Encounter Medications as of 05/27/2020  Medication Sig  . aspirin 81 MG tablet Take 81 mg by mouth daily.  . B-D ULTRAFINE III SHORT PEN 31G X 8 MM MISC USE AS DIRECTED WITH LEVEMIR PEN  . blood glucose meter kit and supplies KIT Dispense based on patient and insurance preference. Use up to four times daily as directed.   . Calcium Carbonate-Vitamin D (CALCIUM 600+D PO) Take 1 tablet by mouth daily.  . carvedilol (COREG) 6.25 MG tablet TAKE 1 TABLET BY MOUTH TWICE A DAY WITH FOOD  . cholecalciferol (VITAMIN D) 1000 UNITS tablet Take 5,000 Units by mouth daily.  . enzalutamide (XTANDI) 40 MG tablet TAKE 4 TABLETS (160 MG TOTAL) BY MOUTH DAILY.  .Marland Kitchenglucose blood test strip 1 each by Other route as needed for other. Use as instructed to check blood sugars  . insulin detemir (LEVEMIR FLEXTOUCH) 100 UNIT/ML FlexPen Inject 15 Units into the skin daily.  . Insulin Syringe-Needle U-100 31G X 5/16" 0.3 ML MISC by Does not apply route. Use as directed with levemir pen  . JANUVIA 100 MG tablet TAKE 1 TABLET BY MOUTH EVERY DAY  . Lancets (ONETOUCH DELICA PLUS LCHENID78E MISC by Does not apply route. Use as directed to check blood  Sugars 2 times per day dx E11.65  . Multiple Vitamin (MULTIVITAMIN WITH MINERALS) TABS Take 1 tablet by mouth daily.  . Olmesartan-amLODIPine-HCTZ 40-10-25 MG TABS TAKE 1 TABLET BY MOUTH EVERY DAY  . Omega-3 Fatty Acids (FISH OIL PO) Take 1 tablet by mouth once.  . pioglitazone-metformin (ACTOPLUS MET) 15-850 MG tablet TAKE 1 TABLET BY MOUTH TWICE A DAY  . rosuvastatin (CRESTOR) 10 MG tablet Take 1 tablet (10 mg total) by mouth daily.  .Marland Kitchenspironolactone (ALDACTONE) 25 MG tablet TAKE 1 TABLET BY MOUTH EVERY DAY  . Blood Pressure Monitoring (BLOOD PRESSURE CUFF) MISC by Does not apply route. Check blood pressure at least 4 times per  week 3 hours after taking medication (Patient not taking: No sig reported)  . NON FORMULARY Take 1 tablet by mouth daily. Thyroid  action (Patient not taking: No sig reported)  . predniSONE (DELTASONE) 5 MG tablet TAKE 1 TABLET BY MOUTH EVERY DAY WITH BREAKFAST (Patient not taking: Reported on 05/27/2020)   No facility-administered encounter medications on file as of 05/27/2020.    Allergies (verified) Patient has no known allergies.   History: Past Medical History:   Diagnosis Date  . Cancer of prostate ([place])   . Diabetes mellitus without complication ([place])   . Hyperlipidemia   . Hypertension    Past Surgical History:  Procedure Laterality Date  . COLONOSCOPY    . HEMORRHOID SURGERY     Family History  Problem Relation Age of Onset  . Hypertension Mother   . Diabetes Mother   . Cancer Father   . Colon cancer Neg Hx   . Esophageal cancer Neg Hx   . Stomach cancer Neg Hx   . Rectal cancer Neg Hx    Social History   Socioeconomic History  . Marital status: Married    Spouse name: Not on file  . Number of children: Not on file  . Years of education: Not on file  . Highest education level: Not on file  Occupational History  . Not on file  Tobacco Use  . Smoking status: Current Some Day Smoker    Packs/day: 0.25    Years: 15.00    Pack years: 3.75    Types: Cigarettes  . Smokeless tobacco: Former Systems developer    Types: Chew    Quit date: 40  . Tobacco comment: smoked about 1/2 PPD for about 10 years; now at 4-5 cigarettes per day   Vaping Use  . Vaping Use: Never used  Substance and Sexual Activity  . Alcohol use: Yes    Comment: occassionally  . Drug use: Never  . Sexual activity: Yes  Other Topics Concern  . Not on file  Social History Narrative  . Not on file   Social Determinants of Health   Financial Resource Strain: Low Risk   . Difficulty of Paying Living Expenses: Not hard at all  Food Insecurity: No Food Insecurity  . Worried About Charity fundraiser in the Last Year: Never true  . Ran Out of Food in the Last Year: Never true  Transportation Needs: No Transportation Needs  . Lack of Transportation (Medical): No  . Lack of Transportation (Non-Medical): No  Physical Activity: Inactive  . Days of Exercise per Week: 0 days  . Minutes of Exercise per Session: 0 min  Stress: No Stress Concern Present  . Feeling of Stress : Not at all  Social Connections: Not on file    Tobacco Counseling Ready to quit:  Yes Counseling given: Not Answered Comment: smoked about 1/2 PPD for about 10 years; now at 4-5 cigarettes per day    Clinical Intake:  Pre-visit preparation completed: Yes  Pain : 0-10 Pain Score: 3  Pain Type: Acute pain Pain Orientation: Right Pain Descriptors / Indicators: Sore Pain Onset: In the past 7 days Pain Frequency: Constant     Nutritional Status: BMI > 30  Obese Nutritional Risks: None Diabetes: Yes  How often do you need to have someone help you when you read instructions, pamphlets, or other written materials from your doctor or pharmacy?: 1 - Never What is the last grade level you completed in school?: college  Diabetic? Yes  Nutrition Risk Assessment:  Has the patient had any N/V/D within the last 2 months?  No  Does the patient have any non-healing wounds?  No  Has the patient had any unintentional weight loss or weight gain?  Yes   Diabetes:  Is the patient diabetic?  Yes  If diabetic, was a CBG obtained today?  No  Did the patient bring in their glucometer from home?  No  How often do you monitor your CBG's? occasionally.   Financial Strains and Diabetes Management:  Are you having any financial strains with the device, your supplies or your medication? No .  Does the patient want to be seen by Chronic Care Management for management of their diabetes?  No  Would the patient like to be referred to a Nutritionist or for Diabetic Management?  No   Diabetic Exams:  Diabetic Eye Exam: Overdue for diabetic eye exam. Pt has been advised about the importance in completing this exam. Patient advised to call and schedule an eye exam. Diabetic Foot Exam: Overdue, Pt has been advised about the importance in completing this exam. Pt is scheduled for diabetic foot exam on next appointment.   Interpreter Needed?: No  Information entered by :: NAllen LPN   Activities of Daily Living In your present state of health, do you have any difficulty performing  the following activities: 05/27/2020  Hearing? N  Vision? N  Difficulty concentrating or making decisions? N  Walking or climbing stairs? N  Dressing or bathing? N  Doing errands, shopping? N  Preparing Food and eating ? N  Using the Toilet? N  In the past six months, have you accidently leaked urine? N  Do you have problems with loss of bowel control? N  Managing your Medications? N  Managing your Finances? N  Housekeeping or managing your Housekeeping? N  Some recent data might be hidden    Patient Care Team: [name], [name] as PCP - General (General Practice)  Indicate any recent Medical Services you may have received from other than [facility] providers in the past year (date may be approximate).     Assessment:   This is a routine wellness examination for Elmin.  Hearing/Vision screen  Hearing Screening   _0  _1  _2  _3  _4  _5  _6  _7  _8   Right ear:           Left ear:           Vision Screening Comments: No regular eye exams,   Dietary issues and exercise activities discussed: Current Exercise Habits: The patient has a physically strenuous job, but has no regular exercise apart from work.  Goals Addressed            This Visit's Progress   . Patient Stated       05/27/2020, wants to retire      Depression Screen PHQ 2/9 Scores 05/27/2020 03/20/2019 12/03/2018 05/16/2018 11/07/2017 12/12/2013  PHQ - 2 Score 0 0 0 0 0 0  PHQ- 9 Score - 0 0 - - -    Fall Risk Fall Risk  05/27/2020 03/20/2019 12/03/2018 05/16/2018 11/07/2017  Falls in the past year? 0 1 0 0 No  Comment - tripped over computer cord - - -  Number falls in past yr: - 0 - - -  Injury with Fall? - 0 - - -  Risk for fall due to : Medication side effect Medication side effect Medication side effect - Medication side effect  Follow up Falls evaluation completed;Education  provided;Falls prevention discussed Falls evaluation completed;Education provided;Falls prevention discussed Falls  evaluation completed;Education provided;Falls prevention discussed - -    FALL RISK PREVENTION PERTAINING TO THE HOME:  Any stairs in or around the home? Yes  If so, are there any without handrails? No  Home free of loose throw rugs in walkways, pet beds, electrical cords, etc? Yes  Adequate lighting in your home to reduce risk of falls? Yes   ASSISTIVE DEVICES UTILIZED TO PREVENT FALLS:  Life alert? No  Use of a cane, walker or w/c? No  Grab bars in the bathroom? No  Shower chair or bench in shower? No  Elevated toilet seat or a handicapped toilet? No   TIMED UP AND GO:  Was the test performed? No .  .   Cognitive Function:     6CIT Screen 05/27/2020 03/20/2019 12/03/2018 11/07/2017  What Year? 0 points 0 points 0 points 0 points  What month? 0 points 0 points 0 points 0 points  What time? 0 points 0 points 0 points 0 points  Count back from 20 0 points 0 points 0 points 0 points  Months in reverse 0 points 0 points 0 points 0 points  Repeat phrase 0 points 0 points 0 points 0 points  Total Score 0 0 0 0    Immunizations Immunization History  Administered Date(s) Administered  . Fluad Quad(high Dose 65+) 11/25/2019  . Influenza, High Dose Seasonal PF 12/03/2018  . PFIZER(Purple Top)SARS-COV-2 Vaccination 04/11/2019, 05/09/2019    TDAP status: Up to date  Flu Vaccine status: Up to date  Pneumococcal vaccine status: Declined,  Education has been provided regarding the importance of this vaccine but patient still declined. Advised may receive this vaccine at local pharmacy or Health Dept. Aware to provide a copy of the vaccination record if obtained from local pharmacy or Health Dept. Verbalized acceptance and understanding.   Covid-19 vaccine status: Completed vaccines  Qualifies for Shingles Vaccine? Yes   Zostavax completed No   Shingrix Completed?: No.    Education has been provided regarding the importance of this vaccine. Patient has been advised to call  insurance company to determine out of pocket expense if they have not yet received this vaccine. Advised may also receive vaccine at local pharmacy or Health Dept. Verbalized acceptance and understanding.  Screening Tests Health Maintenance  Topic Date Due  . PNA vac Low Risk Adult (1 of 2 - PCV13) Never done  . COVID-19 Vaccine (3 - Pfizer risk 4-dose series) 06/06/2019  . FOOT EXAM  12/03/2019  . OPHTHALMOLOGY EXAM  12/13/2019  . INFLUENZA VACCINE  08/09/2020  . HEMOGLOBIN A1C  09/24/2020  . COLONOSCOPY (Pts 45-38yr Insurance coverage will need to be confirmed)  05/23/2022  . TETANUS/TDAP  04/26/2023  . Hepatitis C Screening  Completed  . HPV VACCINES  Aged Out    Health Maintenance  Health Maintenance Due  Topic Date Due  . PNA vac Low Risk Adult (1 of 2 - PCV13) Never done  . COVID-19 Vaccine (3 - Pfizer risk 4-dose series) 06/06/2019  . FOOT EXAM  12/03/2019  . OPHTHALMOLOGY EXAM  12/13/2019    Colorectal cancer screening: Type of screening: Colonoscopy. Completed 05/23/2019. Repeat every 3 years  Lung Cancer Screening: (Low Dose CT Chest recommended if Age 71-80years, 30 pack-year currently smoking OR have quit w/in 15years.) does not qualify.   Lung Cancer Screening Referral: no  Additional Screening:  Hepatitis C Screening: does qualify; Completed 12/03/2018  Vision Screening: Recommended  annual ophthalmology exams for early detection of glaucoma and other disorders of the eye. Is the patient up to date with their annual eye exam?  No  Who is the provider or what is the name of the office in which the patient attends annual eye exams? none If pt is not established with a provider, would they like to be referred to a provider to establish care? No .   Dental Screening: Recommended annual dental exams for proper oral hygiene  Community Resource Referral / Chronic Care Management: CRR required this visit?  No   CCM required this visit?  No      Plan:     I  have personally reviewed and noted the following in the patient's chart:   . Medical and social history . Use of alcohol, tobacco or illicit drugs  . Current medications and supplements including opioid prescriptions. Patient is not currently taking opioid prescriptions. . Functional ability and status . Nutritional status . Physical activity . Advanced directives . List of other physicians . Hospitalizations, surgeries, and ER visits in previous 12 months . Vitals . Screenings to include cognitive, depression, and falls . Referrals and appointments  In addition, I have reviewed and discussed with patient certain preventive protocols, quality metrics, and best practice recommendations. A written personalized care plan for preventive services as well as general preventive health recommendations were provided to patient.     Kellie Simmering, LPN   09/12/90   Nurse Notes:

## 2020-05-27 NOTE — Patient Instructions (Signed)
Kyle George , Thank you for taking time to come for your Medicare Wellness Visit. I appreciate your ongoing commitment to your health goals. Please review the following plan we discussed and let me know if I can assist you in the future.   Screening recommendations/referrals: Colonoscopy: completed 05/23/2019, due 05/23/2022 Recommended yearly ophthalmology/optometry visit for glaucoma screening and checkup Recommended yearly dental visit for hygiene and checkup  Vaccinations: Influenza vaccine: completed 11/25/2019, due 08/09/2020 Pneumococcal vaccine: decline Tdap vaccine: completed 04/25/2013, due 04/26/2023 Shingles vaccine: discussed   Covid-19:  01/01/2020, 05/09/2019, 04/11/2019  Advanced directives: Advance directive discussed with you today.   Conditions/risks identified: smoking  Next appointment: Follow up in one year for your annual wellness visit.   Preventive Care 52 Years and Older, Male Preventive care refers to lifestyle choices and visits with your health care provider that can promote health and wellness. What does preventive care include?  A yearly physical exam. This is also called an annual well check.  Dental exams once or twice a year.  Routine eye exams. Ask your health care provider how often you should have your eyes checked.  Personal lifestyle choices, including:  Daily care of your teeth and gums.  Regular physical activity.  Eating a healthy diet.  Avoiding tobacco and drug use.  Limiting alcohol use.  Practicing safe sex.  Taking low doses of aspirin every day.  Taking vitamin and mineral supplements as recommended by your health care provider. What happens during an annual well check? The services and screenings done by your health care provider during your annual well check will depend on your age, overall health, lifestyle risk factors, and family history of disease. Counseling  Your health care provider may ask you questions about  your:  Alcohol use.  Tobacco use.  Drug use.  Emotional well-being.  Home and relationship well-being.  Sexual activity.  Eating habits.  History of falls.  Memory and ability to understand (cognition).  Work and work Statistician. Screening  You may have the following tests or measurements:  Height, weight, and BMI.  Blood pressure.  Lipid and cholesterol levels. These may be checked every 5 years, or more frequently if you are over 25 years old.  Skin check.  Lung cancer screening. You may have this screening every year starting at age 29 if you have a 30-pack-year history of smoking and currently smoke or have quit within the past 15 years.  Fecal occult blood test (FOBT) of the stool. You may have this test every year starting at age 34.  Flexible sigmoidoscopy or colonoscopy. You may have a sigmoidoscopy every 5 years or a colonoscopy every 10 years starting at age 94.  Prostate cancer screening. Recommendations will vary depending on your family history and other risks.  Hepatitis C blood test.  Hepatitis B blood test.  Sexually transmitted disease (STD) testing.  Diabetes screening. This is done by checking your blood sugar (glucose) after you have not eaten for a while (fasting). You may have this done every 1-3 years.  Abdominal aortic aneurysm (AAA) screening. You may need this if you are a current or former smoker.  Osteoporosis. You may be screened starting at age 85 if you are at high risk. Talk with your health care provider about your test results, treatment options, and if necessary, the need for more tests. Vaccines  Your health care provider may recommend certain vaccines, such as:  Influenza vaccine. This is recommended every year.  Tetanus, diphtheria, and acellular pertussis (  Tdap, Td) vaccine. You may need a Td booster every 10 years.  Zoster vaccine. You may need this after age 71.  Pneumococcal 13-valent conjugate (PCV13) vaccine.  One dose is recommended after age 23.  Pneumococcal polysaccharide (PPSV23) vaccine. One dose is recommended after age 18. Talk to your health care provider about which screenings and vaccines you need and how often you need them. This information is not intended to replace advice given to you by your health care provider. Make sure you discuss any questions you have with your health care provider. Document Released: 01/22/2015 Document Revised: 09/15/2015 Document Reviewed: 10/27/2014 Elsevier Interactive Patient Education  2017 [place] Prevention in the Home Falls can cause injuries. They can happen to people of all ages. There are many things you can do to make your home safe and to help prevent falls. What can I do on the outside of my home?  Regularly fix the edges of walkways and driveways and fix any cracks.  Remove anything that might make you trip as you walk through a door, such as a raised step or threshold.  Trim any bushes or trees on the path to your home.  Use bright outdoor lighting.  Clear any walking paths of anything that might make someone trip, such as rocks or tools.  Regularly check to see if handrails are loose or broken. Make sure that both sides of any steps have handrails.  Any raised decks and porches should have guardrails on the edges.  Have any leaves, snow, or ice cleared regularly.  Use sand or salt on walking paths during winter.  Clean up any spills in your garage right away. This includes oil or grease spills. What can I do in the bathroom?  Use night lights.  Install grab bars by the toilet and in the tub and shower. Do not use towel bars as grab bars.  Use non-skid mats or decals in the tub or shower.  If you need to sit down in the shower, use a plastic, non-slip stool.  Keep the floor dry. Clean up any water that spills on the floor as soon as it happens.  Remove soap buildup in the tub or shower regularly.  Attach bath  mats securely with double-sided non-slip rug tape.  Do not have throw rugs and other things on the floor that can make you trip. What can I do in the bedroom?  Use night lights.  Make sure that you have a light by your bed that is easy to reach.  Do not use any sheets or blankets that are too big for your bed. They should not hang down onto the floor.  Have a firm chair that has side arms. You can use this for support while you get dressed.  Do not have throw rugs and other things on the floor that can make you trip. What can I do in the kitchen?  Clean up any spills right away.  Avoid walking on wet floors.  Keep items that you use a lot in easy-to-reach places.  If you need to reach something above you, use a strong step stool that has a grab bar.  Keep electrical cords out of the way.  Do not use floor polish or wax that makes floors slippery. If you must use wax, use non-skid floor wax.  Do not have throw rugs and other things on the floor that can make you trip. What can I do with my stairs?  Do  not leave any items on the stairs.  Make sure that there are handrails on both sides of the stairs and use them. Fix handrails that are broken or loose. Make sure that handrails are as long as the stairways.  Check any carpeting to make sure that it is firmly attached to the stairs. Fix any carpet that is loose or worn.  Avoid having throw rugs at the top or bottom of the stairs. If you do have throw rugs, attach them to the floor with carpet tape.  Make sure that you have a light switch at the top of the stairs and the bottom of the stairs. If you do not have them, ask someone to add them for you. What else can I do to help prevent falls?  Wear shoes that:  Do not have high heels.  Have rubber bottoms.  Are comfortable and fit you well.  Are closed at the toe. Do not wear sandals.  If you use a stepladder:  Make sure that it is fully opened. Do not climb a closed  stepladder.  Make sure that both sides of the stepladder are locked into place.  Ask someone to hold it for you, if possible.  Clearly mark and make sure that you can see:  Any grab bars or handrails.  First and last steps.  Where the edge of each step is.  Use tools that help you move around (mobility aids) if they are needed. These include:  Canes.  Walkers.  Scooters.  Crutches.  Turn on the lights when you go into a dark area. Replace any light bulbs as soon as they burn out.  Set up your furniture so you have a clear path. Avoid moving your furniture around.  If any of your floors are uneven, fix them.  If there are any pets around you, be aware of where they are.  Review your medicines with your doctor. Some medicines can make you feel dizzy. This can increase your chance of falling. Ask your doctor what other things that you can do to help prevent falls. This information is not intended to replace advice given to you by your health care provider. Make sure you discuss any questions you have with your health care provider. Document Released: 10/22/2008 Document Revised: 06/03/2015 Document Reviewed: 01/30/2014 Elsevier Interactive Patient Education  2017 [employer].

## 2020-06-22 ENCOUNTER — Inpatient Hospital Stay: Payer: Medicare PPO | Attending: Oncology

## 2020-06-22 ENCOUNTER — Other Ambulatory Visit (HOSPITAL_COMMUNITY): Payer: Self-pay

## 2020-06-22 ENCOUNTER — Other Ambulatory Visit: Payer: Self-pay | Admitting: Oncology

## 2020-06-22 ENCOUNTER — Encounter: Payer: Self-pay | Admitting: Oncology

## 2020-06-22 ENCOUNTER — Inpatient Hospital Stay: Payer: Medicare PPO | Admitting: Oncology

## 2020-06-22 ENCOUNTER — Inpatient Hospital Stay: Payer: Medicare PPO

## 2020-06-22 DIAGNOSIS — C61 Malignant neoplasm of prostate: Secondary | ICD-10-CM | POA: Insufficient documentation

## 2020-06-22 DIAGNOSIS — Z79818 Long term (current) use of other agents affecting estrogen receptors and estrogen levels: Secondary | ICD-10-CM | POA: Insufficient documentation

## 2020-06-22 MED ORDER — ENZALUTAMIDE 40 MG PO TABS
ORAL_TABLET | ORAL | 0 refills | Status: DC
  Filled 2020-06-22: qty 120, 30d supply, fill #0

## 2020-06-25 ENCOUNTER — Other Ambulatory Visit: Payer: Self-pay | Admitting: *Deleted

## 2020-06-25 ENCOUNTER — Inpatient Hospital Stay: Payer: Medicare PPO

## 2020-06-25 ENCOUNTER — Telehealth: Payer: Self-pay | Admitting: Oncology

## 2020-06-25 VITALS — BP 163/82 | HR 73 | Temp 97.8°F | Resp 18

## 2020-06-25 DIAGNOSIS — Z79818 Long term (current) use of other agents affecting estrogen receptors and estrogen levels: Secondary | ICD-10-CM | POA: Diagnosis not present

## 2020-06-25 DIAGNOSIS — C61 Malignant neoplasm of prostate: Secondary | ICD-10-CM

## 2020-06-25 LAB — CBC WITH DIFFERENTIAL (CANCER CENTER ONLY)
Abs Immature Granulocytes: 0.01 10*3/uL (ref 0.00–0.07)
Basophils Absolute: 0 10*3/uL (ref 0.0–0.1)
Basophils Relative: 1 %
Eosinophils Absolute: 0.3 10*3/uL (ref 0.0–0.5)
Eosinophils Relative: 4 %
HCT: 40.2 % (ref 39.0–52.0)
Hemoglobin: 13.1 g/dL (ref 13.0–17.0)
Immature Granulocytes: 0 %
Lymphocytes Relative: 20 %
Lymphs Abs: 1.6 10*3/uL (ref 0.7–4.0)
MCH: 32.5 pg (ref 26.0–34.0)
MCHC: 32.6 g/dL (ref 30.0–36.0)
MCV: 99.8 fL (ref 80.0–100.0)
Monocytes Absolute: 0.5 10*3/uL (ref 0.1–1.0)
Monocytes Relative: 6 %
Neutro Abs: 5.5 10*3/uL (ref 1.7–7.7)
Neutrophils Relative %: 69 %
Platelet Count: 183 10*3/uL (ref 150–400)
RBC: 4.03 MIL/uL — ABNORMAL LOW (ref 4.22–5.81)
RDW: 14.1 % (ref 11.5–15.5)
WBC Count: 8 10*3/uL (ref 4.0–10.5)
nRBC: 0 % (ref 0.0–0.2)

## 2020-06-25 LAB — CMP (CANCER CENTER ONLY)
ALT: 11 U/L (ref 0–44)
AST: 15 U/L (ref 15–41)
Albumin: 3.7 g/dL (ref 3.5–5.0)
Alkaline Phosphatase: 60 U/L (ref 38–126)
Anion gap: 11 (ref 5–15)
BUN: 24 mg/dL — ABNORMAL HIGH (ref 8–23)
CO2: 26 mmol/L (ref 22–32)
Calcium: 9.4 mg/dL (ref 8.9–10.3)
Chloride: 102 mmol/L (ref 98–111)
Creatinine: 1.11 mg/dL (ref 0.61–1.24)
GFR, Estimated: >60 mL/min (ref >60)
Glucose, Bld: 102 mg/dL — ABNORMAL HIGH (ref 70–99)
Potassium: 3.8 mmol/L (ref 3.5–5.1)
Sodium: 139 mmol/L (ref 135–145)
Total Bilirubin: 0.5 mg/dL (ref 0.3–1.2)
Total Protein: 7.6 g/dL (ref 6.5–8.1)

## 2020-06-25 MED ORDER — LEUPROLIDE ACETATE (4 MONTH) 30 MG ~~LOC~~ KIT
30.0000 mg | PACK | Freq: Once | SUBCUTANEOUS | Status: AC
  Administered 2020-06-25: 30 mg via SUBCUTANEOUS

## 2020-06-25 MED ORDER — LEUPROLIDE ACETATE (4 MONTH) 30 MG ~~LOC~~ KIT
PACK | SUBCUTANEOUS | Status: AC
  Filled 2020-06-25: qty 30

## 2020-06-25 NOTE — Telephone Encounter (Signed)
Sch per 6/15 sch msg, left message

## 2020-06-26 LAB — PROSTATE-SPECIFIC AG, SERUM (LABCORP): Prostate Specific Ag, Serum: <0.1 ng/mL (ref 0.0–4.0)

## 2020-06-28 ENCOUNTER — Telehealth: Payer: Self-pay | Admitting: Oncology

## 2020-06-28 NOTE — Telephone Encounter (Signed)
Made appt on 7/1 a phone visit per 6/17 sch msg. Called pt, no answer. Left msg letting pt know about change.

## 2020-07-09 ENCOUNTER — Other Ambulatory Visit: Payer: Medicare PPO

## 2020-07-09 ENCOUNTER — Other Ambulatory Visit: Payer: Self-pay

## 2020-07-09 ENCOUNTER — Inpatient Hospital Stay: Payer: Medicare PPO | Attending: Oncology | Admitting: Oncology

## 2020-07-09 ENCOUNTER — Ambulatory Visit: Payer: Medicare PPO

## 2020-07-09 VITALS — BP 151/90 | HR 79 | Temp 98.1°F | Resp 18 | Ht 68.0 in | Wt 208.9 lb

## 2020-07-09 DIAGNOSIS — Z192 Hormone resistant malignancy status: Secondary | ICD-10-CM | POA: Insufficient documentation

## 2020-07-09 DIAGNOSIS — Z79899 Other long term (current) drug therapy: Secondary | ICD-10-CM | POA: Diagnosis not present

## 2020-07-09 DIAGNOSIS — I10 Essential (primary) hypertension: Secondary | ICD-10-CM | POA: Insufficient documentation

## 2020-07-09 DIAGNOSIS — C61 Malignant neoplasm of prostate: Secondary | ICD-10-CM | POA: Insufficient documentation

## 2020-07-09 DIAGNOSIS — Z7982 Long term (current) use of aspirin: Secondary | ICD-10-CM | POA: Insufficient documentation

## 2020-07-09 NOTE — Progress Notes (Signed)
Hematology and Oncology Follow Up Visit  Kyle George [id number] [birth year] 70 y.o. 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  He was found to have Gleason score 4+5 = 9  and PSA of 397 at the time of diagnosis.  Prior Therapy: Status post androgen deprivation and initially with [place] subsequently with Lupron with a PSA dropping down to 500 and December 2015.  He failed to follow-up with the PSA rising up to 1400 and April 2018.   Zytiga 1000 mg daily with prednisone at 5 mg daily started in August 2018.   Current therapy:  Eligard 30 mg every 4 months.  He received Eligard in June and will be repeated in October 2022.  Xtandi 160 mg daily started on March 01, 2020.  Interim History:  Mr. Beg is here for a follow-up visit.  Since the last visit, he reports feeling well without any major complaints.  He has tolerated Xtandi without any major issues.  He denies any nausea, fatigue or excessive tiredness.  He continues to work close to full-time although getting ready to retire.  He denies any worsening bone pain or pathological fractures.  Denies any hospitalization or illnesses.     Medications: Updated on review. Current Outpatient Medications  Medication Sig Dispense Refill   aspirin 81 MG tablet Take 81 mg by mouth daily.     B-D ULTRAFINE III SHORT PEN 31G X 8 MM MISC USE AS DIRECTED WITH LEVEMIR PEN 100 each 5   blood glucose meter kit and supplies KIT Dispense based on patient and insurance preference. Use up to four times daily as directed. 1 each 0   Blood Pressure Monitoring (BLOOD PRESSURE CUFF) MISC by Does not apply route. Check blood pressure at least 4 times per week 3 hours after taking medication (Patient not taking: No sig reported)     Calcium Carbonate-Vitamin D (CALCIUM 600+D PO) Take 1 tablet by mouth daily.      carvedilol (COREG) 6.25 MG tablet TAKE 1 TABLET BY MOUTH TWICE A DAY WITH FOOD 180 tablet 0   cholecalciferol (VITAMIN D) 1000 UNITS tablet Take 5,000 Units by mouth daily.     enzalutamide (XTANDI) 40 MG tablet TAKE 4 TABLETS (160 MG TOTAL) BY MOUTH DAILY. 120 tablet 0   glucose blood test strip 1 each by Other route as needed for other. Use as instructed to check blood sugars     insulin detemir (LEVEMIR FLEXTOUCH) 100 UNIT/ML FlexPen Inject 15 Units into the skin daily. 15 mL 1   Insulin Syringe-Needle U-100 31G X 5/16" 0.3 ML MISC by Does not apply route. Use as directed with levemir pen     JANUVIA 100 MG tablet TAKE 1 TABLET BY MOUTH EVERY DAY 90 tablet 0   Lancets (ONETOUCH DELICA PLUS [id number]) MISC by Does not apply route. Use as directed to check blood  Sugars 2 times per day dx E11.65     Multiple Vitamin (MULTIVITAMIN WITH MINERALS) TABS Take 1 tablet by mouth daily.     NON FORMULARY Take 1 tablet by mouth daily. Thyroid  action (Patient not taking: No sig reported)     Olmesartan-amLODIPine-HCTZ 40-10-25 MG TABS TAKE 1 TABLET BY MOUTH EVERY DAY 90 tablet 1   Omega-3 Fatty Acids (FISH OIL PO) Take 1 tablet by mouth once.     pioglitazone-metformin (ACTOPLUS MET) 15-850 MG tablet TAKE 1 TABLET BY MOUTH  TWICE A DAY 180 tablet 1   predniSONE (DELTASONE) 5 MG tablet TAKE 1 TABLET BY MOUTH EVERY DAY WITH BREAKFAST (Patient not taking: Reported on 05/27/2020) 30 tablet 1   rosuvastatin (CRESTOR) 10 MG tablet Take 1 tablet (10 mg total) by mouth daily. 90 tablet 1   spironolactone (ALDACTONE) 25 MG tablet TAKE 1 TABLET BY MOUTH EVERY DAY 90 tablet 1   No current facility-administered medications for this visit.     Allergies:  No Known Allergies     Physical Exam:   Blood pressure (!) 151/90, pulse 79, temperature 98.1 F (36.7 C), temperature source Tympanic, resp. rate 18, height '5\' 8"'  (1.727 m), weight 208 lb 14.4 oz (94.8 kg), SpO2 97 %.       ECOG: 0     General  appearance: Alert, awake without any distress. Head: Atraumatic without abnormalities Oropharynx: Without any thrush or ulcers. Eyes: No scleral icterus. Lymph nodes: No lymphadenopathy noted in the cervical, supraclavicular, or axillary nodes Heart:regular rate and rhythm, without any murmurs or gallops.   Lung: Clear to auscultation without any rhonchi, wheezes or dullness to percussion. Abdomin: Soft, nontender without any shifting dullness or ascites. Musculoskeletal: No clubbing or cyanosis. Neurological: No motor or sensory deficits. Skin: No rashes or lesions.             Lab Results: Lab Results  Component Value Date   WBC 8.0 06/25/2020   HGB 13.1 06/25/2020   HCT 40.2 06/25/2020   MCV 99.8 06/25/2020   PLT 183 06/25/2020              Results for Kyle George, Kyle George (MRN [id number]) as of 07/09/2020 15:18  Ref. Range 03/24/2020 08:46 04/29/2020 14:38 06/25/2020 15:24  Prostate Specific Ag, Serum Latest Ref Range: 0.0 - 4.0 ng/mL 0.8 0.1 <0.12         70 year old man with:  1.  Advanced prostate cancer with disease to the bone and lymphadenopathy diagnosed in 2013.  He has castration-resistant disease at this time.   He is currently on Xtandi and has experienced excellent response despite previous exposure to [place].  His PSA on June 25, 2020 was undetectable after rise up to 27 in February 2022.  Risks and benefits of continuing this treatment were discussed at this time.  Complications that include hypertension,, fatigue, rash and rarely seizures were reviewed.  Alternative treatment options such as systemic chemotherapy will be deferred at this time.  He is agreeable to continue.   2. Androgen depravation: This will be repeated in 4 months.  Complications including weight gain, hot flashes among others were reiterated.   3. Bone health: He is currently on calcium and vitamin D supplements which I recommended continuing.  Delton See has been deferred for dental  issues.  5.  Hypertension: His blood pressure is mildly elevated but overall under control visits.  We will continue to monitor on Xtandi.  6.  Prognosis and goals of care: His disease is incurable although aggressive measures are warranted given his excellent performance status.  7. Follow-up: In 4 months for repeat follow-up.   30 minutes were spent on this visit.  Time was dedicated to reviewing laboratory data, disease status update and future plan of care discussion.  Zola Button, MD 7/1/20223:17 PM

## 2020-07-12 DIAGNOSIS — M542 Cervicalgia: Secondary | ICD-10-CM | POA: Diagnosis not present

## 2020-07-14 ENCOUNTER — Other Ambulatory Visit: Payer: Self-pay | Admitting: Nurse Practitioner

## 2020-07-14 DIAGNOSIS — E782 Mixed hyperlipidemia: Secondary | ICD-10-CM

## 2020-07-15 ENCOUNTER — Other Ambulatory Visit: Payer: Self-pay | Admitting: Nurse Practitioner

## 2020-07-15 DIAGNOSIS — M9901 Segmental and somatic dysfunction of cervical region: Secondary | ICD-10-CM | POA: Diagnosis not present

## 2020-07-15 DIAGNOSIS — M546 Pain in thoracic spine: Secondary | ICD-10-CM | POA: Diagnosis not present

## 2020-07-15 DIAGNOSIS — C61 Malignant neoplasm of prostate: Secondary | ICD-10-CM

## 2020-07-15 DIAGNOSIS — M9902 Segmental and somatic dysfunction of thoracic region: Secondary | ICD-10-CM | POA: Diagnosis not present

## 2020-07-15 DIAGNOSIS — M542 Cervicalgia: Secondary | ICD-10-CM | POA: Diagnosis not present

## 2020-07-15 DIAGNOSIS — M9903 Segmental and somatic dysfunction of lumbar region: Secondary | ICD-10-CM | POA: Diagnosis not present

## 2020-07-16 DIAGNOSIS — M542 Cervicalgia: Secondary | ICD-10-CM | POA: Diagnosis not present

## 2020-07-16 DIAGNOSIS — M9902 Segmental and somatic dysfunction of thoracic region: Secondary | ICD-10-CM | POA: Diagnosis not present

## 2020-07-16 DIAGNOSIS — M9903 Segmental and somatic dysfunction of lumbar region: Secondary | ICD-10-CM | POA: Diagnosis not present

## 2020-07-16 DIAGNOSIS — M9901 Segmental and somatic dysfunction of cervical region: Secondary | ICD-10-CM | POA: Diagnosis not present

## 2020-07-16 DIAGNOSIS — M546 Pain in thoracic spine: Secondary | ICD-10-CM | POA: Diagnosis not present

## 2020-07-19 DIAGNOSIS — M9902 Segmental and somatic dysfunction of thoracic region: Secondary | ICD-10-CM | POA: Diagnosis not present

## 2020-07-19 DIAGNOSIS — M542 Cervicalgia: Secondary | ICD-10-CM | POA: Diagnosis not present

## 2020-07-19 DIAGNOSIS — M9903 Segmental and somatic dysfunction of lumbar region: Secondary | ICD-10-CM | POA: Diagnosis not present

## 2020-07-19 DIAGNOSIS — M546 Pain in thoracic spine: Secondary | ICD-10-CM | POA: Diagnosis not present

## 2020-07-19 DIAGNOSIS — M9901 Segmental and somatic dysfunction of cervical region: Secondary | ICD-10-CM | POA: Diagnosis not present

## 2020-07-21 ENCOUNTER — Other Ambulatory Visit (HOSPITAL_COMMUNITY): Payer: Self-pay

## 2020-07-21 ENCOUNTER — Other Ambulatory Visit: Payer: Self-pay | Admitting: Oncology

## 2020-07-21 DIAGNOSIS — C61 Malignant neoplasm of prostate: Secondary | ICD-10-CM

## 2020-07-21 MED ORDER — ENZALUTAMIDE 40 MG PO TABS
ORAL_TABLET | ORAL | 0 refills | Status: DC
  Filled 2020-07-21: qty 120, 30d supply, fill #0

## 2020-07-22 ENCOUNTER — Other Ambulatory Visit (HOSPITAL_COMMUNITY): Payer: Self-pay

## 2020-07-22 ENCOUNTER — Encounter: Payer: Self-pay | Admitting: Oncology

## 2020-07-22 ENCOUNTER — Telehealth: Payer: Self-pay | Admitting: Pharmacy Technician

## 2020-07-22 DIAGNOSIS — M546 Pain in thoracic spine: Secondary | ICD-10-CM | POA: Diagnosis not present

## 2020-07-22 DIAGNOSIS — M62838 Other muscle spasm: Secondary | ICD-10-CM | POA: Diagnosis not present

## 2020-07-22 DIAGNOSIS — M9901 Segmental and somatic dysfunction of cervical region: Secondary | ICD-10-CM | POA: Diagnosis not present

## 2020-07-22 DIAGNOSIS — R600 Localized edema: Secondary | ICD-10-CM | POA: Diagnosis not present

## 2020-07-22 DIAGNOSIS — M5412 Radiculopathy, cervical region: Secondary | ICD-10-CM | POA: Diagnosis not present

## 2020-07-22 DIAGNOSIS — M9902 Segmental and somatic dysfunction of thoracic region: Secondary | ICD-10-CM | POA: Diagnosis not present

## 2020-07-22 DIAGNOSIS — M542 Cervicalgia: Secondary | ICD-10-CM | POA: Diagnosis not present

## 2020-07-22 NOTE — Telephone Encounter (Signed)
Oral Oncology Patient Advocate Encounter   Was successful in securing patient an [phone] grant from Patient [place] [facility]) to provide copayment coverage for Xtandi.  This will keep the out of pocket expense at $0.     I have spoken with the patient.    The billing information is as follows and has been shared with [place].   Member ID: [id number] Group ID: [id number] RxBin: [id number] Dates of Eligibility: 07/22/20 through 07/21/21  Fund:  [place] [facility] [place] Phone [phone] Fax [phone] 07/22/2020 9:28 AM

## 2020-08-03 ENCOUNTER — Other Ambulatory Visit (HOSPITAL_COMMUNITY): Payer: Self-pay

## 2020-08-17 ENCOUNTER — Other Ambulatory Visit (HOSPITAL_COMMUNITY): Payer: Self-pay

## 2020-08-19 ENCOUNTER — Other Ambulatory Visit: Payer: Self-pay | Admitting: Oncology

## 2020-08-19 ENCOUNTER — Other Ambulatory Visit (HOSPITAL_COMMUNITY): Payer: Self-pay

## 2020-08-19 DIAGNOSIS — C61 Malignant neoplasm of prostate: Secondary | ICD-10-CM

## 2020-08-19 MED ORDER — ENZALUTAMIDE 40 MG PO TABS
ORAL_TABLET | ORAL | 0 refills | Status: DC
  Filled 2020-08-19: qty 120, fill #0
  Filled 2020-08-23: qty 120, 30d supply, fill #0

## 2020-08-23 ENCOUNTER — Other Ambulatory Visit (HOSPITAL_COMMUNITY): Payer: Self-pay

## 2020-09-16 ENCOUNTER — Other Ambulatory Visit (HOSPITAL_COMMUNITY): Payer: Self-pay

## 2020-09-21 ENCOUNTER — Other Ambulatory Visit: Payer: Self-pay | Admitting: Oncology

## 2020-09-21 ENCOUNTER — Other Ambulatory Visit (HOSPITAL_COMMUNITY): Payer: Self-pay

## 2020-09-21 DIAGNOSIS — C61 Malignant neoplasm of prostate: Secondary | ICD-10-CM

## 2020-09-21 MED ORDER — ENZALUTAMIDE 40 MG PO TABS
ORAL_TABLET | ORAL | 0 refills | Status: DC
  Filled 2020-09-21: qty 120, 30d supply, fill #0

## 2020-09-22 ENCOUNTER — Other Ambulatory Visit (HOSPITAL_COMMUNITY): Payer: Self-pay

## 2020-10-03 ENCOUNTER — Other Ambulatory Visit: Payer: Self-pay | Admitting: Nurse Practitioner

## 2020-10-03 DIAGNOSIS — C61 Malignant neoplasm of prostate: Secondary | ICD-10-CM

## 2020-10-14 ENCOUNTER — Other Ambulatory Visit: Payer: Self-pay | Admitting: Nurse Practitioner

## 2020-10-14 DIAGNOSIS — C61 Malignant neoplasm of prostate: Secondary | ICD-10-CM

## 2020-10-18 ENCOUNTER — Other Ambulatory Visit (HOSPITAL_COMMUNITY): Payer: Self-pay

## 2020-10-18 ENCOUNTER — Other Ambulatory Visit: Payer: Self-pay | Admitting: Oncology

## 2020-10-18 DIAGNOSIS — C61 Malignant neoplasm of prostate: Secondary | ICD-10-CM

## 2020-10-19 ENCOUNTER — Other Ambulatory Visit (HOSPITAL_COMMUNITY): Payer: Self-pay

## 2020-10-19 ENCOUNTER — Encounter: Payer: Self-pay | Admitting: Oncology

## 2020-10-19 MED ORDER — ENZALUTAMIDE 40 MG PO TABS
ORAL_TABLET | ORAL | 0 refills | Status: DC
  Filled 2020-10-19: qty 120, 30d supply, fill #0

## 2020-10-21 ENCOUNTER — Other Ambulatory Visit (HOSPITAL_COMMUNITY): Payer: Self-pay

## 2020-10-22 ENCOUNTER — Other Ambulatory Visit: Payer: Self-pay | Admitting: Nurse Practitioner

## 2020-10-22 DIAGNOSIS — E119 Type 2 diabetes mellitus without complications: Secondary | ICD-10-CM

## 2020-10-31 ENCOUNTER — Other Ambulatory Visit: Payer: Self-pay | Admitting: Nurse Practitioner

## 2020-10-31 DIAGNOSIS — C61 Malignant neoplasm of prostate: Secondary | ICD-10-CM

## 2020-11-12 ENCOUNTER — Inpatient Hospital Stay: Payer: Medicare PPO

## 2020-11-12 ENCOUNTER — Other Ambulatory Visit: Payer: Self-pay

## 2020-11-12 ENCOUNTER — Inpatient Hospital Stay: Payer: Medicare PPO | Attending: Oncology

## 2020-11-12 ENCOUNTER — Inpatient Hospital Stay (HOSPITAL_BASED_OUTPATIENT_CLINIC_OR_DEPARTMENT_OTHER): Payer: Medicare PPO | Admitting: Oncology

## 2020-11-12 VITALS — BP 157/74 | HR 64 | Temp 97.0°F | Resp 20 | Wt 202.3 lb

## 2020-11-12 DIAGNOSIS — M810 Age-related osteoporosis without current pathological fracture: Secondary | ICD-10-CM | POA: Insufficient documentation

## 2020-11-12 DIAGNOSIS — Z192 Hormone resistant malignancy status: Secondary | ICD-10-CM | POA: Diagnosis not present

## 2020-11-12 DIAGNOSIS — Z79899 Other long term (current) drug therapy: Secondary | ICD-10-CM | POA: Diagnosis not present

## 2020-11-12 DIAGNOSIS — C61 Malignant neoplasm of prostate: Secondary | ICD-10-CM | POA: Insufficient documentation

## 2020-11-12 DIAGNOSIS — C779 Secondary and unspecified malignant neoplasm of lymph node, unspecified: Secondary | ICD-10-CM | POA: Diagnosis not present

## 2020-11-12 DIAGNOSIS — Z79818 Long term (current) use of other agents affecting estrogen receptors and estrogen levels: Secondary | ICD-10-CM | POA: Insufficient documentation

## 2020-11-12 DIAGNOSIS — I10 Essential (primary) hypertension: Secondary | ICD-10-CM | POA: Diagnosis not present

## 2020-11-12 DIAGNOSIS — Z7982 Long term (current) use of aspirin: Secondary | ICD-10-CM | POA: Diagnosis not present

## 2020-11-12 LAB — CMP (CANCER CENTER ONLY)
ALT: 6 U/L (ref 0–44)
AST: 12 U/L — ABNORMAL LOW (ref 15–41)
Albumin: 3.5 g/dL (ref 3.5–5.0)
Alkaline Phosphatase: 67 U/L (ref 38–126)
Anion gap: 10 (ref 5–15)
BUN: 14 mg/dL (ref 8–23)
CO2: 24 mmol/L (ref 22–32)
Calcium: 9.1 mg/dL (ref 8.9–10.3)
Chloride: 108 mmol/L (ref 98–111)
Creatinine: 0.83 mg/dL (ref 0.61–1.24)
GFR, Estimated: >60 mL/min (ref >60)
Glucose, Bld: 72 mg/dL (ref 70–99)
Potassium: 3.9 mmol/L (ref 3.5–5.1)
Sodium: 142 mmol/L (ref 135–145)
Total Bilirubin: 0.4 mg/dL (ref 0.3–1.2)
Total Protein: 7.4 g/dL (ref 6.5–8.1)

## 2020-11-12 LAB — CBC WITH DIFFERENTIAL (CANCER CENTER ONLY)
Abs Immature Granulocytes: 0 10*3/uL (ref 0.00–0.07)
Basophils Absolute: 0 10*3/uL (ref 0.0–0.1)
Basophils Relative: 0 %
Eosinophils Absolute: 0.4 10*3/uL (ref 0.0–0.5)
Eosinophils Relative: 5 %
HCT: 41 % (ref 39.0–52.0)
Hemoglobin: 13.4 g/dL (ref 13.0–17.0)
Immature Granulocytes: 0 %
Lymphocytes Relative: 25 %
Lymphs Abs: 1.8 10*3/uL (ref 0.7–4.0)
MCH: 32.7 pg (ref 26.0–34.0)
MCHC: 32.7 g/dL (ref 30.0–36.0)
MCV: 100 fL (ref 80.0–100.0)
Monocytes Absolute: 0.4 10*3/uL (ref 0.1–1.0)
Monocytes Relative: 6 %
Neutro Abs: 4.8 10*3/uL (ref 1.7–7.7)
Neutrophils Relative %: 64 %
Platelet Count: 174 10*3/uL (ref 150–400)
RBC: 4.1 MIL/uL — ABNORMAL LOW (ref 4.22–5.81)
RDW: 15.2 % (ref 11.5–15.5)
WBC Count: 7.5 10*3/uL (ref 4.0–10.5)
nRBC: 0 % (ref 0.0–0.2)

## 2020-11-12 MED ORDER — LEUPROLIDE ACETATE (4 MONTH) 30 MG ~~LOC~~ KIT
30.0000 mg | PACK | Freq: Once | SUBCUTANEOUS | Status: AC
  Administered 2020-11-12: 30 mg via SUBCUTANEOUS
  Filled 2020-11-12: qty 30

## 2020-11-12 NOTE — Progress Notes (Signed)
Hematology and Oncology Follow Up Visit  Kyle George [id number] 03/18/1950 70 y.o. 11/12/2020 9:54 AM [name], Janece, FNPMoore, Janece, [name]   Principle Diagnosis: 70-year-old man with advanced prostate cancer with disease involvement of the lymph nodes.  He has castration-resistant after presenting at time of diagnosis with Gleason score 4+5 = 9  and PSA of 397.  Prior Therapy: Status post androgen deprivation and initially with [place] subsequently with Lupron with a PSA dropping down to 500 and December 2015.  He failed to follow-up with the PSA rising up to 1400 and April 2018.   Zytiga 1000 mg daily with prednisone at 5 mg daily started in August 2018.   Current therapy:  Eligard 30 mg every 4 months.  He will receive Eligard today and repeated in 4 months.  Xtandi 160 mg daily started on March 01, 2020.  Interim History:  Mr. Decoursey returns today for repeat follow-up.  Since the last visit, he reports no major changes in his health.  He denies any recent hospitalizations or illnesses.  He denies any bone pain or pathological fractures.  He denies any complications related to Xtandi including his fatigue or tiredness.  Performance status quality of life remain excellent.     Medications: Reviewed without changes. Current Outpatient Medications  Medication Sig Dispense Refill   aspirin 81 MG tablet Take 81 mg by mouth daily.     B-D ULTRAFINE III SHORT PEN 31G X 8 MM MISC USE AS DIRECTED WITH LEVEMIR PEN 100 each 5   blood glucose meter kit and supplies KIT Dispense based on patient and insurance preference. Use up to four times daily as directed. 1 each 0   Blood Pressure Monitoring (BLOOD PRESSURE CUFF) MISC by Does not apply route. Check blood pressure at least 4 times per week 3 hours after taking medication (Patient not taking: No sig reported)     Calcium Carbonate-Vitamin D (CALCIUM 600+D PO) Take 1 tablet by mouth daily.     carvedilol (COREG) 6.25 MG tablet TAKE 1  TABLET BY MOUTH TWICE A DAY WITH FOOD 60 tablet 0   cholecalciferol (VITAMIN D) 1000 UNITS tablet Take 5,000 Units by mouth daily.     enzalutamide (XTANDI) 40 MG tablet TAKE 4 TABLETS (160 MG TOTAL) BY MOUTH DAILY. 120 tablet 0   glucose blood test strip 1 each by Other route as needed for other. Use as instructed to check blood sugars     insulin detemir (LEVEMIR FLEXTOUCH) 100 UNIT/ML FlexPen Inject 15 Units into the skin daily. 15 mL 1   Insulin Syringe-Needle U-100 31G X 5/16" 0.3 ML MISC by Does not apply route. Use as directed with levemir pen     JANUVIA 100 MG tablet TAKE 1 TABLET BY MOUTH EVERY DAY 90 tablet 0   Lancets (ONETOUCH DELICA PLUS LANCET33G) MISC by Does not apply route. Use as directed to check blood  Sugars 2 times per day dx E11.65     Multiple Vitamin (MULTIVITAMIN WITH MINERALS) TABS Take 1 tablet by mouth daily.     NON FORMULARY Take 1 tablet by mouth daily. Thyroid  action (Patient not taking: No sig reported)     Olmesartan-amLODIPine-HCTZ 40-10-25 MG TABS TAKE 1 TABLET BY MOUTH EVERY DAY 90 tablet 1   Omega-3 Fatty Acids (FISH OIL PO) Take 1 tablet by mouth once.     pioglitazone-metformin (ACTOPLUS MET) 15-850 MG tablet TAKE 1 TABLET BY MOUTH TWICE A DAY 180 tablet 1   predniSONE (DELTASONE) 5   MG tablet TAKE 1 TABLET BY MOUTH EVERY DAY WITH BREAKFAST (Patient not taking: Reported on 05/27/2020) 30 tablet 1   rosuvastatin (CRESTOR) 10 MG tablet TAKE 1 TABLET BY MOUTH EVERY DAY 90 tablet 1   spironolactone (ALDACTONE) 25 MG tablet TAKE 1 TABLET BY MOUTH EVERY DAY 90 tablet 1   No current facility-administered medications for this visit.     Allergies:  No Known Allergies     Physical Exam:     Blood pressure (!) 157/74, pulse 64, temperature (!) 97 F (36.1 C), resp. rate 20, weight 202 lb 4.8 oz (91.8 kg), SpO2 98 %.      ECOG: 0    General appearance: Comfortable appearing without any discomfort Head: Normocephalic without any  trauma Oropharynx: Mucous membranes are moist and pink without any thrush or ulcers. Eyes: Pupils are equal and round reactive to light. Lymph nodes: No cervical, supraclavicular, inguinal or axillary lymphadenopathy.   Heart:regular rate and rhythm.  S1 and S2 without leg edema. Lung: Clear without any rhonchi or wheezes.  No dullness to percussion. Abdomin: Soft, nontender, nondistended with good bowel sounds.  No hepatosplenomegaly. Musculoskeletal: No joint deformity or effusion.  Full range of motion noted. Neurological: No deficits noted on motor, sensory and deep tendon reflex exam. Skin: No petechial rash or dryness.  Appeared moist.               Lab Results: Lab Results  Component Value Date   WBC 8.0 06/25/2020   HGB 13.1 06/25/2020   HCT 40.2 06/25/2020   MCV 99.8 06/25/2020   PLT 183 06/25/2020             Results for Dreyfuss, Marquett G (MRN [id number]) as of 11/12/2020 09:56  Ref. Range 04/29/2020 14:38 06/25/2020 15:24  Prostate Specific Ag, Serum Latest Ref Range: 0.0 - 4.0 ng/mL 0.1 <0.1           70-year-old man with:  1.  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     Treatment choices moving forward for his cancer were discussed at this time.  He continues to tolerate Xtandi with reasonable response.  His PSA continues to be undetectable with excellent tolerance.  Systemic chemotherapy, Xofigo among others were reiterated.  At this time he is agreeable to continue.  Additional therapy will be deferred if he developed progression of disease.   2. Androgen depravation: Risks and benefits of receiving Eligard today were discussed.  Complication occluding weight gain, hot flashes and osteoporosis were reviewed.   3. Bone health: I recommended calcium and vitamin D supplements to maintain his dental clearance.  Continues to have dental issues and Xgeva has been deferred.  5.   Hypertension: Blood pressure is mildly elevated but overall manageable.  6.  Prognosis and goals of care: Therapy remains palliative although aggressive measures are warranted at this time.  7. Follow-up: He will return in 4 months for a follow-up visit.   30 minutes were dedicated to this encounter.  The time spent on reviewing laboratory data, disease status update, treatment choices and outlining future plan of care.  Firas Shadad, MD 11/4/20229:54 AM 

## 2020-11-13 LAB — PROSTATE-SPECIFIC AG, SERUM (LABCORP): Prostate Specific Ag, Serum: <0.1 ng/mL (ref 0.0–4.0)

## 2020-11-15 ENCOUNTER — Telehealth: Payer: Self-pay | Admitting: *Deleted

## 2020-11-15 ENCOUNTER — Other Ambulatory Visit: Payer: Self-pay | Admitting: Nurse Practitioner

## 2020-11-15 DIAGNOSIS — C61 Malignant neoplasm of prostate: Secondary | ICD-10-CM

## 2020-11-15 NOTE — Telephone Encounter (Signed)
PSA labs were given to patient via phone.  No further questions at this time.

## 2020-11-15 NOTE — Telephone Encounter (Signed)
-----   Message from Patton Salles, RN sent at 11/15/2020  9:50 AM EST -----  ----- Message ----- From: Wyatt Portela, MD Sent: 11/15/2020   8:50 AM EST To: Patton Salles, RN  Please let him know his PSA is very low

## 2020-11-18 ENCOUNTER — Other Ambulatory Visit (HOSPITAL_COMMUNITY): Payer: Self-pay

## 2020-11-18 ENCOUNTER — Other Ambulatory Visit: Payer: Self-pay | Admitting: Oncology

## 2020-11-18 DIAGNOSIS — C61 Malignant neoplasm of prostate: Secondary | ICD-10-CM

## 2020-11-18 MED ORDER — ENZALUTAMIDE 40 MG PO TABS
ORAL_TABLET | ORAL | 0 refills | Status: DC
  Filled 2020-11-18: qty 120, 30d supply, fill #0

## 2020-11-19 ENCOUNTER — Other Ambulatory Visit (HOSPITAL_COMMUNITY): Payer: Self-pay

## 2020-12-13 ENCOUNTER — Other Ambulatory Visit: Payer: Self-pay | Admitting: Nurse Practitioner

## 2020-12-15 ENCOUNTER — Other Ambulatory Visit: Payer: Self-pay

## 2020-12-15 ENCOUNTER — Ambulatory Visit: Payer: Medicare PPO | Admitting: Nurse Practitioner

## 2020-12-15 ENCOUNTER — Encounter: Payer: Self-pay | Admitting: Nurse Practitioner

## 2020-12-15 VITALS — BP 128/80 | HR 73 | Temp 98.8°F | Ht 68.0 in | Wt 204.6 lb

## 2020-12-15 DIAGNOSIS — E119 Type 2 diabetes mellitus without complications: Secondary | ICD-10-CM | POA: Diagnosis not present

## 2020-12-15 DIAGNOSIS — Z23 Encounter for immunization: Secondary | ICD-10-CM | POA: Diagnosis not present

## 2020-12-15 DIAGNOSIS — I10 Essential (primary) hypertension: Secondary | ICD-10-CM

## 2020-12-15 DIAGNOSIS — C7951 Secondary malignant neoplasm of bone: Secondary | ICD-10-CM | POA: Diagnosis not present

## 2020-12-15 DIAGNOSIS — E1169 Type 2 diabetes mellitus with other specified complication: Secondary | ICD-10-CM | POA: Diagnosis not present

## 2020-12-15 MED ORDER — SHINGRIX 50 MCG/0.5ML IM SUSR: 0.5000 mL | Freq: Once | INTRAMUSCULAR | 0 refills | Status: AC

## 2020-12-15 NOTE — Patient Instructions (Signed)

## 2020-12-15 NOTE — Progress Notes (Signed)
I,Victoria T Hamilton,acting as a Education administrator for [name], [name].,have documented all relevant documentation on the behalf of [name], [name],as directed by  [name], [name] while in the presence of [name], [place].   This visit occurred during the SARS-CoV-2 public health emergency.  Safety protocols were in place, including screening questions prior to the visit, additional usage of staff PPE, and extensive cleaning of exam room while observing appropriate contact time as indicated for disinfecting solutions.  Subjective:     Patient ID: Kyle George , male    DOB: 03/14/1950 , 70 y.o.   MRN: [id number]   Chief Complaint  Patient presents with   Diabetes   Hypertension    HPI  Patient here for a blood pressure and diabetes check. Pt is willing to complete foot exam today.   Diabetes He presents for his follow-up diabetic visit. He has type 2 diabetes mellitus. There are no hypoglycemic associated symptoms. Pertinent negatives for hypoglycemia include no dizziness or headaches. There are no diabetic associated symptoms. Pertinent negatives for diabetes include no chest pain, no fatigue, no polydipsia, no polyphagia and no polyuria. There are no hypoglycemic complications. There are no diabetic complications. Risk factors for coronary artery disease include male sex and sedentary lifestyle. Current diabetic treatment includes oral agent (dual therapy). He is compliant with treatment some of the time. He is following a generally healthy diet. When asked about meal planning, he reported none. He has not had a previous visit with a dietitian. Exercise: he works part time as a Retail buyer. (Does not check his blood sugar at home. He does not have a machine) He does not see a podiatrist.Eye exam is not current.  Hypertension This is a chronic problem. The current episode started more than 1 year ago. The problem is unchanged. The problem is controlled. Pertinent negatives include no chest pain,  headaches, malaise/fatigue, palpitations or shortness of breath. There are no associated agents to hypertension. There are no known risk factors for coronary artery disease. Past treatments include ACE inhibitors. There are no compliance problems.  There is no history of kidney disease. There is no history of chronic renal disease.    Past Medical History:  Diagnosis Date   Cancer of prostate ([place])    Diabetes mellitus without complication ([place])    Hyperlipidemia    Hypertension      Family History  Problem Relation Age of Onset   Hypertension Mother    Diabetes Mother    Cancer Father    Colon cancer Neg Hx    Esophageal cancer Neg Hx    Stomach cancer Neg Hx    Rectal cancer Neg Hx      Current Outpatient Medications:    aspirin 81 MG tablet, Take 81 mg by mouth daily., Disp: , Rfl:    B-D ULTRAFINE III SHORT PEN 31G X 8 MM MISC, USE AS DIRECTED WITH LEVEMIR PEN, Disp: 100 each, Rfl: 5   blood glucose meter kit and supplies KIT, Dispense based on patient and insurance preference. Use up to four times daily as directed., Disp: 1 each, Rfl: 0   Blood Pressure Monitoring (BLOOD PRESSURE CUFF) MISC, by Does not apply route. Check blood pressure at least 4 times per week 3 hours after taking medication, Disp: , Rfl:    Calcium Carbonate-Vitamin D (CALCIUM 600+D PO), Take 1 tablet by mouth daily., Disp: , Rfl:    carvedilol (COREG) 6.25 MG tablet, TAKE 1 TABLET BY MOUTH TWICE A DAY  WITH FOOD, Disp: 60 tablet, Rfl: 0   cholecalciferol (VITAMIN D) 1000 UNITS tablet, Take 5,000 Units by mouth daily., Disp: , Rfl:    enzalutamide (XTANDI) 40 MG tablet, TAKE 4 TABLETS (160 MG TOTAL) BY MOUTH DAILY., Disp: 120 tablet, Rfl: 0   glucose blood test strip, 1 each by Other route as needed for other. Use as instructed to check blood sugars, Disp: , Rfl:    insulin detemir (LEVEMIR FLEXTOUCH) 100 UNIT/ML FlexPen, Inject 15 Units into the skin daily., Disp: 15 mL, Rfl: 1   Insulin Syringe-Needle U-100  31G X 5/16" 0.3 ML MISC, by Does not apply route. Use as directed with levemir pen, Disp: , Rfl:    JANUVIA 100 MG tablet, TAKE 1 TABLET BY MOUTH EVERY DAY, Disp: 90 tablet, Rfl: 0   Lancets (ONETOUCH DELICA PLUS [id number]) MISC, by Does not apply route. Use as directed to check blood  Sugars 2 times per day dx E11.65, Disp: , Rfl:    Multiple Vitamin (MULTIVITAMIN WITH MINERALS) TABS, Take 1 tablet by mouth daily., Disp: , Rfl:    Olmesartan-amLODIPine-HCTZ 40-10-25 MG TABS, TAKE 1 TABLET BY MOUTH EVERY DAY, Disp: 90 tablet, Rfl: 1   Omega-3 Fatty Acids (FISH OIL PO), Take 1 tablet by mouth once., Disp: , Rfl:    pioglitazone-metformin (ACTOPLUS MET) 15-850 MG tablet, TAKE 1 TABLET BY MOUTH TWICE A DAY, Disp: 180 tablet, Rfl: 1   rosuvastatin (CRESTOR) 10 MG tablet, TAKE 1 TABLET BY MOUTH EVERY DAY, Disp: 90 tablet, Rfl: 1   spironolactone (ALDACTONE) 25 MG tablet, TAKE 1 TABLET BY MOUTH EVERY DAY, Disp: 90 tablet, Rfl: 1   Zoster Vaccine Adjuvanted (SHINGRIX) injection, Inject 0.5 mLs into the muscle once for 1 dose. Repeat in 2-6 months, Disp: 0.5 mL, Rfl: 0   NON FORMULARY, Take 1 tablet by mouth daily. Thyroid  action, Disp: , Rfl:    predniSONE (DELTASONE) 5 MG tablet, TAKE 1 TABLET BY MOUTH EVERY DAY WITH BREAKFAST (Patient not taking: Reported on 12/15/2020), Disp: 30 tablet, Rfl: 1   No Known Allergies   Review of Systems  Constitutional:  Negative for fatigue and malaise/fatigue.  Respiratory:  Negative for shortness of breath.   Cardiovascular:  Negative for chest pain and palpitations.  Endocrine: Negative for polydipsia, polyphagia and polyuria.  Neurological:  Negative for dizziness and headaches.    Today's Vitals   12/15/20 1546  BP: 128/80  Pulse: 73  Temp: 98.8 F (37.1 C)  Weight: 204 lb 9.6 oz (92.8 kg)  Height: '5\' 8"'  (1.727 m)  PainSc: 0-No pain   Body mass index is 31.11 kg/m.  Wt Readings from Last 3 Encounters:  12/15/20 204 lb 9.6 oz (92.8 kg)  11/12/20 202  lb 4.8 oz (91.8 kg)  07/09/20 208 lb 14.4 oz (94.8 kg)    Objective:  Physical Exam Vitals reviewed.  Constitutional:      General: He is not in acute distress.    Appearance: Normal appearance. He is obese.  Cardiovascular:     Rate and Rhythm: Normal rate and regular rhythm.     Pulses: Normal pulses.     Heart sounds: Normal heart sounds. No murmur heard. Pulmonary:     Effort: Pulmonary effort is normal. No respiratory distress.     Breath sounds: Normal breath sounds. No wheezing.  Skin:    General: Skin is warm and dry.     Capillary Refill: Capillary refill takes less than 2 seconds.  Neurological:  General: No focal deficit present.     Mental Status: He is alert and oriented to person, place, and time.     Cranial Nerves: No cranial nerve deficit.     Motor: No weakness.  Psychiatric:        Mood and Affect: Mood normal.        Behavior: Behavior normal.        Thought Content: Thought content normal.        Judgment: Judgment normal.        Assessment And Plan:     1. Essential hypertension Comments: Blood pressure is well controlled, continue current medications  2. Type 2 diabetes mellitus without complication, without long-term current use of insulin ([facility]) Comments: Stable, tolerating medications well. Continue current medications. 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 Immunization due Influenza vaccine administered Encouraged to take Tylenol as needed for fever or muscle aches. He will get his shingrix done in 2 weeks.  - Flu Vaccine QUAD High Dose(Fluad) - Zoster Vaccine Adjuvanted [facility]) injection; Inject 0.5 mLs into the muscle once for 1 dose. Repeat in 2-6 months  Dispense: 0.5 mL; Refill: 0    Patient was given opportunity to ask questions. Patient verbalized understanding of the plan and  was able to repeat key elements of the plan. All questions were answered to their satisfaction.  [name], [name]   I, [name], [name], have reviewed all documentation for this visit. The documentation on 12/15/20 for the exam, diagnosis, procedures, and orders are all accurate and complete.   IF YOU HAVE BEEN REFERRED TO A SPECIALIST, IT MAY TAKE 1-2 WEEKS TO SCHEDULE/PROCESS THE REFERRAL. IF YOU HAVE NOT HEARD FROM US/SPECIALIST IN TWO WEEKS, PLEASE GIVE [place] A CALL AT [phone] X [phone].   THE PATIENT IS ENCOURAGED TO PRACTICE SOCIAL DISTANCING DUE TO THE COVID-19 PANDEMIC.

## 2020-12-16 LAB — HEMOGLOBIN A1C
Est. average glucose Bld gHb Est-mCnc: 126 mg/dL
Hgb A1c MFr Bld: 6 % — ABNORMAL HIGH (ref 4.8–5.6)

## 2020-12-20 ENCOUNTER — Telehealth: Payer: Self-pay | Admitting: Pharmacy Technician

## 2020-12-20 NOTE — Telephone Encounter (Signed)
Received notification from [facility] regarding a prior authorization for  Xtandi 40mg  . Authorization has been APPROVED from 12/20/20 to 06/18/21.   Authorization # [id number]

## 2020-12-21 ENCOUNTER — Other Ambulatory Visit: Payer: Self-pay | Admitting: Oncology

## 2020-12-21 ENCOUNTER — Other Ambulatory Visit (HOSPITAL_COMMUNITY): Payer: Self-pay

## 2020-12-21 DIAGNOSIS — C61 Malignant neoplasm of prostate: Secondary | ICD-10-CM

## 2020-12-21 MED ORDER — ENZALUTAMIDE 40 MG PO TABS
ORAL_TABLET | ORAL | 0 refills | Status: DC
  Filled 2020-12-21: qty 120, 30d supply, fill #0

## 2020-12-22 ENCOUNTER — Other Ambulatory Visit (HOSPITAL_COMMUNITY): Payer: Self-pay

## 2021-01-03 ENCOUNTER — Other Ambulatory Visit: Payer: Self-pay | Admitting: Nurse Practitioner

## 2021-01-03 DIAGNOSIS — E782 Mixed hyperlipidemia: Secondary | ICD-10-CM

## 2021-01-14 ENCOUNTER — Other Ambulatory Visit (HOSPITAL_COMMUNITY): Payer: Self-pay

## 2021-01-14 ENCOUNTER — Other Ambulatory Visit: Payer: Self-pay | Admitting: Oncology

## 2021-01-14 ENCOUNTER — Other Ambulatory Visit: Payer: Self-pay | Admitting: Nurse Practitioner

## 2021-01-14 DIAGNOSIS — C61 Malignant neoplasm of prostate: Secondary | ICD-10-CM

## 2021-01-14 MED ORDER — ENZALUTAMIDE 40 MG PO TABS
ORAL_TABLET | ORAL | 0 refills | Status: DC
  Filled 2021-01-14: qty 120, 30d supply, fill #0

## 2021-01-18 ENCOUNTER — Other Ambulatory Visit (HOSPITAL_COMMUNITY): Payer: Self-pay

## 2021-01-22 ENCOUNTER — Other Ambulatory Visit: Payer: Self-pay | Admitting: Nurse Practitioner

## 2021-01-22 DIAGNOSIS — C61 Malignant neoplasm of prostate: Secondary | ICD-10-CM

## 2021-02-09 ENCOUNTER — Other Ambulatory Visit: Payer: Self-pay | Admitting: Oncology

## 2021-02-09 ENCOUNTER — Other Ambulatory Visit (HOSPITAL_COMMUNITY): Payer: Self-pay

## 2021-02-09 DIAGNOSIS — C61 Malignant neoplasm of prostate: Secondary | ICD-10-CM

## 2021-02-09 MED ORDER — ENZALUTAMIDE 40 MG PO TABS
ORAL_TABLET | ORAL | 0 refills | Status: DC
  Filled 2021-02-09: qty 120, 30d supply, fill #0

## 2021-02-15 ENCOUNTER — Other Ambulatory Visit (HOSPITAL_COMMUNITY): Payer: Self-pay

## 2021-02-19 ENCOUNTER — Other Ambulatory Visit: Payer: Self-pay | Admitting: Nurse Practitioner

## 2021-02-19 DIAGNOSIS — C61 Malignant neoplasm of prostate: Secondary | ICD-10-CM

## 2021-03-09 ENCOUNTER — Other Ambulatory Visit (HOSPITAL_COMMUNITY): Payer: Self-pay

## 2021-03-11 ENCOUNTER — Inpatient Hospital Stay: Payer: Medicare PPO

## 2021-03-11 ENCOUNTER — Other Ambulatory Visit: Payer: Self-pay | Admitting: *Deleted

## 2021-03-11 ENCOUNTER — Inpatient Hospital Stay: Payer: Medicare PPO | Admitting: Oncology

## 2021-03-11 ENCOUNTER — Other Ambulatory Visit (HOSPITAL_COMMUNITY): Payer: Self-pay

## 2021-03-11 NOTE — Progress Notes (Signed)
Called pt to f/u about missing appt's. Pt stated he forget. Scheduling message was sent to reschedule appt.  ?

## 2021-03-14 ENCOUNTER — Telehealth: Payer: Self-pay | Admitting: Oncology

## 2021-03-14 ENCOUNTER — Other Ambulatory Visit (HOSPITAL_COMMUNITY): Payer: Self-pay

## 2021-03-14 ENCOUNTER — Other Ambulatory Visit: Payer: Self-pay | Admitting: Oncology

## 2021-03-14 DIAGNOSIS — C61 Malignant neoplasm of prostate: Secondary | ICD-10-CM

## 2021-03-14 MED ORDER — ENZALUTAMIDE 40 MG PO TABS
ORAL_TABLET | ORAL | 0 refills | Status: DC
  Filled 2021-03-21: qty 120, 30d supply, fill #0

## 2021-03-14 NOTE — Telephone Encounter (Signed)
Scheduled per 3/6 in basket, pt has been called and confirmed new appt ?

## 2021-03-21 ENCOUNTER — Other Ambulatory Visit (HOSPITAL_COMMUNITY): Payer: Self-pay

## 2021-03-23 ENCOUNTER — Other Ambulatory Visit: Payer: Self-pay | Admitting: Nurse Practitioner

## 2021-03-23 DIAGNOSIS — C61 Malignant neoplasm of prostate: Secondary | ICD-10-CM

## 2021-03-26 ENCOUNTER — Other Ambulatory Visit: Payer: Self-pay | Admitting: Nurse Practitioner

## 2021-03-29 ENCOUNTER — Other Ambulatory Visit (HOSPITAL_COMMUNITY): Payer: Self-pay

## 2021-03-29 ENCOUNTER — Encounter: Payer: Self-pay | Admitting: Nurse Practitioner

## 2021-03-29 ENCOUNTER — Ambulatory Visit (INDEPENDENT_AMBULATORY_CARE_PROVIDER_SITE_OTHER): Payer: Medicare PPO | Admitting: Nurse Practitioner

## 2021-03-29 ENCOUNTER — Other Ambulatory Visit: Payer: Self-pay

## 2021-03-29 VITALS — BP 130/74 | HR 85 | Temp 98.1°F | Ht 68.0 in | Wt 204.6 lb

## 2021-03-29 DIAGNOSIS — Z0000 Encounter for general adult medical examination without abnormal findings: Secondary | ICD-10-CM | POA: Diagnosis not present

## 2021-03-29 DIAGNOSIS — E782 Mixed hyperlipidemia: Secondary | ICD-10-CM | POA: Diagnosis not present

## 2021-03-29 DIAGNOSIS — C7951 Secondary malignant neoplasm of bone: Secondary | ICD-10-CM | POA: Diagnosis not present

## 2021-03-29 DIAGNOSIS — M7989 Other specified soft tissue disorders: Secondary | ICD-10-CM | POA: Diagnosis not present

## 2021-03-29 DIAGNOSIS — E1169 Type 2 diabetes mellitus with other specified complication: Secondary | ICD-10-CM | POA: Diagnosis not present

## 2021-03-29 DIAGNOSIS — C61 Malignant neoplasm of prostate: Secondary | ICD-10-CM | POA: Diagnosis not present

## 2021-03-29 DIAGNOSIS — Z79899 Other long term (current) drug therapy: Secondary | ICD-10-CM | POA: Diagnosis not present

## 2021-03-29 DIAGNOSIS — I10 Essential (primary) hypertension: Secondary | ICD-10-CM

## 2021-03-29 DIAGNOSIS — R9431 Abnormal electrocardiogram [ECG] [EKG]: Secondary | ICD-10-CM | POA: Diagnosis not present

## 2021-03-29 DIAGNOSIS — E6609 Other obesity due to excess calories: Secondary | ICD-10-CM | POA: Diagnosis not present

## 2021-03-29 DIAGNOSIS — E119 Type 2 diabetes mellitus without complications: Secondary | ICD-10-CM

## 2021-03-29 DIAGNOSIS — Z6831 Body mass index (BMI) 31.0-31.9, adult: Secondary | ICD-10-CM

## 2021-03-29 LAB — POCT URINALYSIS DIPSTICK
Bilirubin, UA: NEGATIVE
Glucose, UA: NEGATIVE
Ketones, UA: NEGATIVE
Leukocytes, UA: NEGATIVE
Nitrite, UA: NEGATIVE
Protein, UA: NEGATIVE
Spec Grav, UA: 1.025 (ref 1.010–1.025)
Urobilinogen, UA: 1 E.U./dL
pH, UA: 5.5 (ref 5.0–8.0)

## 2021-03-29 NOTE — Progress Notes (Signed)
?I,Victoria T Hamilton,acting as a scribe for [name], [name].,have documented all relevant documentation on the behalf of [name], [name],as directed by  [name], [name] while in the presence of [name], [place].  ? ?This visit occurred during the SARS-CoV-2 public health emergency.  Safety protocols were in place, including screening questions prior to the visit, additional usage of staff PPE, and extensive cleaning of exam room while observing appropriate contact time as indicated for disinfecting solutions. ? ?Subjective:  ?  ? Patient ID: Kyle George , male    DOB: 05-Jul-1950 , 71 y.o.   MRN: [id number] ? ? ?Chief Complaint  ?Patient presents with  ? Annual Exam  ? ? ?HPI ? ?Patient here today for HM. He has no specific questions or concerns. Denies chest pain, SOB, blurred vision. Continues to work with Musician.  Continues to see the Urologist.  Most recent PSA was less than 0.1 done 11/2020.   ? ?Wt Readings from Last 3 Encounters: ?03/29/21 : 204 lb 9.6 oz (92.8 kg) ?12/15/20 : 204 lb 9.6 oz (92.8 kg) ?11/12/20 : 202 lb 4.8 oz (91.8 kg) ?  ?  ? ?Past Medical History:  ?Diagnosis Date  ? Cancer of prostate [facility])   ? Diabetes mellitus without complication ([place])   ? Hyperlipidemia   ? Hypertension   ?  ? ?Family History  ?Problem Relation Age of Onset  ? Hypertension Mother   ? Diabetes Mother   ? Cancer Father   ? Colon cancer Neg Hx   ? Esophageal cancer Neg Hx   ? Stomach cancer Neg Hx   ? Rectal cancer Neg Hx   ? ? ? ?Current Outpatient Medications:  ?  aspirin 81 MG tablet, Take 81 mg by mouth daily., Disp: , Rfl:  ?  B-D ULTRAFINE III SHORT PEN 31G X 8 MM MISC, USE AS DIRECTED WITH LEVEMIR PEN, Disp: 100 each, Rfl: 5 ?  blood glucose meter kit and supplies KIT, Dispense based on patient and insurance preference. Use up to four times daily as directed., Disp: 1 each, Rfl: 0 ?  Blood Pressure Monitoring (BLOOD PRESSURE CUFF) MISC, by Does not apply route. Check blood pressure at least 4  times per week 3 hours after taking medication, Disp: , Rfl:  ?  Calcium Carbonate-Vitamin D (CALCIUM 600+D PO), Take 1 tablet by mouth daily., Disp: , Rfl:  ?  carvedilol (COREG) 6.25 MG tablet, TAKE 1 TABLET BY MOUTH TWICE A DAY WITH FOOD, Disp: 60 tablet, Rfl: 0 ?  cholecalciferol (VITAMIN D) 1000 UNITS tablet, Take 5,000 Units by mouth daily., Disp: , Rfl:  ?  glucose blood test strip, 1 each by Other route as needed for other. Use as instructed to check blood sugars, Disp: , Rfl:  ?  insulin detemir (LEVEMIR FLEXTOUCH) 100 UNIT/ML FlexPen, Inject 15 Units into the skin daily., Disp: 15 mL, Rfl: 1 ?  Insulin Syringe-Needle U-100 31G X 5/16" 0.3 ML MISC, by Does not apply route. Use as directed with levemir pen, Disp: , Rfl:  ?  JANUVIA 100 MG tablet, TAKE 1 TABLET BY MOUTH EVERY DAY, Disp: 90 tablet, Rfl: 0 ?  Lancets (ONETOUCH DELICA PLUS HFGBMS11D) MISC, by Does not apply route. Use as directed to check blood  Sugars 2 times per day dx E11.65, Disp: , Rfl:  ?  Multiple Vitamin (MULTIVITAMIN WITH MINERALS) TABS, Take 1 tablet by mouth daily., Disp: , Rfl:  ?  NON FORMULARY, Take 1 tablet by mouth  daily. Thyroid  action, Disp: , Rfl:  ?  Olmesartan-amLODIPine-HCTZ 40-10-25 MG TABS, TAKE 1 TABLET BY MOUTH EVERY DAY, Disp: 90 tablet, Rfl: 1 ?  Omega-3 Fatty Acids (FISH OIL PO), Take 1 tablet by mouth once., Disp: , Rfl:  ?  pioglitazone-metformin (ACTOPLUS MET) 15-850 MG tablet, TAKE 1 TABLET BY MOUTH TWICE A DAY, Disp: 180 tablet, Rfl: 1 ?  rosuvastatin (CRESTOR) 10 MG tablet, TAKE 1 TABLET BY MOUTH EVERY DAY, Disp: 90 tablet, Rfl: 1 ?  spironolactone (ALDACTONE) 25 MG tablet, TAKE 1 TABLET BY MOUTH EVERY DAY, Disp: 90 tablet, Rfl: 1 ?  enzalutamide (XTANDI) 40 MG tablet, TAKE 4 TABLETS (160 MG TOTAL) BY MOUTH DAILY., Disp: 120 tablet, Rfl: 0 ?  predniSONE (DELTASONE) 5 MG tablet, TAKE 1 TABLET BY MOUTH EVERY DAY WITH BREAKFAST (Patient not taking: Reported on 12/15/2020), Disp: 30 tablet, Rfl: 1  ? ?No Known  Allergies  ? ?Men's preventive visit. Patient Health Questionnaire (PHQ-2) is  ?Flowsheet Row Clinical Support from 05/27/2020 in [facility]  ?PHQ-2 Total Score 0  ? ?  ? ?Patient is on a Regular diet, feels like his appetite has changed in the last 10 months. No extra exercise outside of work. Marital status: Married. Relevant history for alcohol use is:  ?Social History  ? ?Substance and Sexual Activity  ?Alcohol Use Yes  ? Comment: occassionally  ? ?Relevant history for tobacco use is:  ?Social History  ? ?Tobacco Use  ?Smoking Status Some Days  ? Packs/day: 0.25  ? Years: 15.00  ? Pack years: 3.75  ? Types: Cigarettes  ?Smokeless Tobacco Former  ? Types: Chew  ? Quit date: 61  ?Tobacco Comments  ? smoked about 1/2 PPD for about 10 years; now at 4-5 cigarettes per day   ?.  ? ?Review of Systems  ?Constitutional:  Positive for appetite change (decreased appetite).  ?HENT: Negative.    ?Eyes: Negative.   ?Respiratory: Negative.    ?Cardiovascular: Negative.   ?Gastrointestinal: Negative.   ?Endocrine: Negative.   ?Genitourinary: Negative.   ?Skin: Negative.   ?Allergic/Immunologic: Negative.   ?Neurological: Negative.   ?Psychiatric/Behavioral: Negative.     ? ?Today's Vitals  ? 03/29/21 1414  ?BP: 130/74  ?Pulse: 85  ?Temp: 98.1 ?F (36.7 ?C)  ?SpO2: 98%  ?Weight: 204 lb 9.6 oz (92.8 kg)  ?Height: _0  (1.727 m)  ? ?Body mass index is 31.11 kg/m?.  ?Wt Readings from Last 3 Encounters:  ?03/29/21 204 lb 9.6 oz (92.8 kg)  ?12/15/20 204 lb 9.6 oz (92.8 kg)  ?11/12/20 202 lb 4.8 oz (91.8 kg)  ?  ?Objective:  ?Physical Exam ?Vitals reviewed.  ?Constitutional:   ?   General: He is not in acute distress. ?   Appearance: Normal appearance. He is obese.  ?HENT:  ?   Head: Normocephalic and atraumatic.  ?   Right Ear: Tympanic membrane, ear canal and external ear normal. There is no impacted cerumen.  ?   Left Ear: Tympanic membrane, ear canal and external ear normal. There is no impacted cerumen.   ?   Nose:  ?   Comments: Deferred - masked ?   Mouth/Throat:  ?   Comments: Deferred - masked ?Eyes:  ?   Pupils: Pupils are equal, round, and reactive to light.  ?Cardiovascular:  ?   Rate and Rhythm: Normal rate and regular rhythm.  ?   Pulses:     ?     Popliteal pulses are 1+  on the right side and 1+ on the left side.  ?     Dorsalis pedis pulses are 1+ on the right side and 1+ on the left side.  ?   Heart sounds: Normal heart sounds. No murmur heard. ?Pulmonary:  ?   Effort: Pulmonary effort is normal. No respiratory distress.  ?   Breath sounds: Normal breath sounds. No wheezing.  ?Abdominal:  ?   General: Abdomen is flat. Bowel sounds are normal.  ?   Palpations: Abdomen is soft.  ?Genitourinary: ?   Comments: Being followed by urology ?Musculoskeletal:     ?   General: Swelling (left lower extremity with hyperpigmented skin) present. No tenderness. Normal range of motion.  ?   Cervical back: Normal range of motion and neck supple.  ?Skin: ?   General: Skin is warm and dry.  ?   Capillary Refill: Capillary refill takes less than 2 seconds.  ?Neurological:  ?   General: No focal deficit present.  ?   Mental Status: He is alert and oriented to person, place, and time.  ?   Cranial Nerves: No cranial nerve deficit.  ?   Motor: No weakness.  ?Psychiatric:     ?   Mood and Affect: Mood normal.     ?   Behavior: Behavior normal.     ?   Thought Content: Thought content normal.     ?   Judgment: Judgment normal.  ?  ? ?   ?Assessment And Plan:  ?  ?1. Encounter for general adult medical examination w/o abnormal findings ?Behavior modifications discussed and diet history reviewed.   ?Pt will continue to exercise regularly and modify diet with low GI, plant based foods and decrease intake of processed foods.  ?Recommend intake of daily multivitamin, Vitamin D, and calcium.  ?Recommend colonoscopy for preventive screenings, as well as recommend immunizations that include influenza, TDAP, and Shingles ?- Hemoglobin  A1c ? ?2. Class 1 obesity due to excess calories without serious comorbidity with body mass index (BMI) of 31.0 to 31.9 in adult ?Chronic ?Discussed healthy diet and regular exercise options in addition to h

## 2021-03-29 NOTE — Patient Instructions (Addendum)
Health Maintenance, Male ?Adopting a healthy lifestyle and getting preventive care are important in promoting health and wellness. Ask your health care provider about: ?The right schedule for you to have regular tests and exams. ?Things you can do on your own to prevent diseases and keep yourself healthy. ?What should I know about diet, weight, and exercise? ?Eat a healthy diet ? ?Eat a diet that includes plenty of vegetables, fruits, low-fat dairy products, and lean protein. ?Do not eat a lot of foods that are high in solid fats, added sugars, or sodium. ?Maintain a healthy weight ?Body mass index (BMI) is a measurement that can be used to identify possible weight problems. It estimates body fat based on height and weight. Your health care provider can help determine your BMI and help you achieve or maintain a healthy weight. ?Get regular exercise ?Get regular exercise. This is one of the most important things you can do for your health. Most adults should: ?Exercise for at least 150 minutes each week. The exercise should increase your heart rate and make you sweat (moderate-intensity exercise). ?Do strengthening exercises at least twice a week. This is in addition to the moderate-intensity exercise. ?Spend less time sitting. Even light physical activity can be beneficial. ?Watch cholesterol and blood lipids ?Have your blood tested for lipids and cholesterol at 72 years of age, then have this test every 5 years. ?You may need to have your cholesterol levels checked more often if: ?Your lipid or cholesterol levels are high. ?You are older than 72 years of age. ?You are at high risk for heart disease. ?What should I know about cancer screening? ?Many types of cancers can be detected early and may often be prevented. Depending on your health history and family history, you may need to have cancer screening at various ages. This may include screening for: ?Colorectal cancer. ?Prostate cancer. ?Skin cancer. ?Lung  cancer. ?What should I know about heart disease, diabetes, and high blood pressure? ?Blood pressure and heart disease ?High blood pressure causes heart disease and increases the risk of stroke. This is more likely to develop in people who have high blood pressure readings or are overweight. ?Talk with your health care provider about your target blood pressure readings. ?Have your blood pressure checked: ?Every 3-5 years if you are 42-43 years of age. ?Every year if you are 47 years old or older. ?If you are between the ages of 11 and 57 and are a current or former smoker, ask your health care provider if you should have a one-time screening for abdominal aortic aneurysm (AAA). ?Diabetes ?Have regular diabetes screenings. This checks your fasting blood sugar level. Have the screening done: ?Once every three years after age 8 if you are at a normal weight and have a low risk for diabetes. ?More often and at a younger age if you are overweight or have a high risk for diabetes. ?What should I know about preventing infection? ?Hepatitis B ?If you have a higher risk for hepatitis B, you should be screened for this virus. Talk with your health care provider to find out if you are at risk for hepatitis B infection. ?Hepatitis C ?Blood testing is recommended for: ?Everyone born from [birth year] through 1966. ?Anyone with known risk factors for hepatitis C. ?Sexually transmitted infections (STIs) ?You should be screened each year for STIs, including gonorrhea and chlamydia, if: ?You are sexually active and are younger than 72 years of age. ?You are older than 72 years of age and your  health care provider tells you that you are at risk for this type of infection. ?Your sexual activity has changed since you were last screened, and you are at increased risk for chlamydia or gonorrhea. Ask your health care provider if you are at risk. ?Ask your health care provider about whether you are at high risk for HIV. Your health care provider  may recommend a prescription medicine to help prevent HIV infection. If you choose to take medicine to prevent HIV, you should first get tested for HIV. You should then be tested every 3 months for as long as you are taking the medicine. ?Follow these instructions at home: ?Alcohol use ?Do not drink alcohol if your health care provider tells you not to drink. ?If you drink alcohol: ?Limit how much you have to 0-2 drinks a day. ?Know how much alcohol is in your drink. In the [place] one drink equals one 12 oz bottle of beer (355 mL), one 5 oz glass of wine (148 mL), or one 1? oz glass of hard liquor (44 mL). ?Lifestyle ?Do not use any products that contain nicotine or tobacco. These products include cigarettes, chewing tobacco, and vaping devices, such as e-cigarettes. If you need help quitting, ask your health care provider. ?Do not use street drugs. ?Do not share needles. ?Ask your health care provider for help if you need support or information about quitting drugs. ?General instructions ?Schedule regular health, dental, and eye exams. ?Stay current with your vaccines. ?Tell your health care provider if: ?You often feel depressed. ?You have ever been abused or do not feel safe at home. ?Summary ?Adopting a healthy lifestyle and getting preventive care are important in promoting health and wellness. ?Follow your health care provider's instructions about healthy diet, exercising, and getting tested or screened for diseases. ?Follow your health care provider's instructions on monitoring your cholesterol and blood pressure. ?This information is not intended to replace advice given to you by your health care provider. Make sure you discuss any questions you have with your health care provider. ?Document Revised: 05/17/2020 Document Reviewed: 05/17/2020 ?Elsevier Patient Education ? [place]. ? ?[facility] ?Address: [address], [place], [place] [address] ?Phone: [phone] ? ?DR Lyndon Code   ?[address] [place]  [address] ? [phone] ?

## 2021-03-30 LAB — LIPID PANEL
Chol/HDL Ratio: 1.9 ratio (ref 0.0–5.0)
Cholesterol, Total: 122 mg/dL (ref 100–199)
HDL: 63 mg/dL (ref >39)
LDL Chol Calc (NIH): 40 mg/dL (ref 0–99)
Triglycerides: 103 mg/dL (ref 0–149)
VLDL Cholesterol Cal: 19 mg/dL (ref 5–40)

## 2021-03-30 LAB — CMP14+EGFR
ALT: 7 IU/L (ref 0–44)
AST: 15 IU/L (ref 0–40)
Albumin/Globulin Ratio: 1.5 (ref 1.2–2.2)
Albumin: 4 g/dL (ref 3.7–4.7)
Alkaline Phosphatase: 75 IU/L (ref 44–121)
BUN/Creatinine Ratio: 20 (ref 10–24)
BUN: 19 mg/dL (ref 8–27)
Bilirubin Total: 0.2 mg/dL (ref 0.0–1.2)
CO2: 25 mmol/L (ref 20–29)
Calcium: 9 mg/dL (ref 8.6–10.2)
Chloride: 100 mmol/L (ref 96–106)
Creatinine, Ser: 0.97 mg/dL (ref 0.76–1.27)
Globulin, Total: 2.6 g/dL (ref 1.5–4.5)
Glucose: 84 mg/dL (ref 70–99)
Potassium: 3.8 mmol/L (ref 3.5–5.2)
Sodium: 138 mmol/L (ref 134–144)
Total Protein: 6.6 g/dL (ref 6.0–8.5)
eGFR: 83 mL/min/{1.73_m2} (ref >59)

## 2021-03-30 LAB — CBC
Hematocrit: 39 % (ref 37.5–51.0)
Hemoglobin: 13.5 g/dL (ref 13.0–17.7)
MCH: 33.7 pg — ABNORMAL HIGH (ref 26.6–33.0)
MCHC: 34.6 g/dL (ref 31.5–35.7)
MCV: 97 fL (ref 79–97)
Platelets: 138 10*3/uL — ABNORMAL LOW (ref 150–450)
RBC: 4.01 x10E6/uL — ABNORMAL LOW (ref 4.14–5.80)
RDW: 13.6 % (ref 11.6–15.4)
WBC: 6.1 10*3/uL (ref 3.4–10.8)

## 2021-03-30 LAB — MICROALBUMIN / CREATININE URINE RATIO
Creatinine, Urine: 93.8 mg/dL
Microalb/Creat Ratio: 18 mg/g creat (ref 0–29)
Microalbumin, Urine: 16.8 ug/mL

## 2021-03-30 LAB — HEMOGLOBIN A1C
Est. average glucose Bld gHb Est-mCnc: 111 mg/dL
Hgb A1c MFr Bld: 5.5 % (ref 4.8–5.6)

## 2021-04-14 ENCOUNTER — Other Ambulatory Visit (HOSPITAL_COMMUNITY): Payer: Self-pay

## 2021-04-16 IMAGING — CT CT CHEST-ABD-PELV W/ CM
3 of 5 series · 14 of 36 positions shown, 15 images · IV contrast (OMNIPAQUE)
Comparison: 06/05/2018

CLINICAL DATA: Restaging of prostate cancer. Eligard in progress.
Zytega therapy completed.

EXAM:
CT CHEST, ABDOMEN, AND PELVIS WITH CONTRAST
TECHNIQUE: Multidetector CT imaging of the chest, abdomen and pelvis was
performed following the standard protocol during bolus
administration of intravenous contrast.
CONTRAST:  100mL OMNIPAQUE IOHEXOL 300 MG/ML  SOLN

[Series 2: cap with · axial · 0.84mm/px · z∈[-587,-87]mm · 8 of 130 slices shown, 9 images]
[im 15/130  mediastinal]
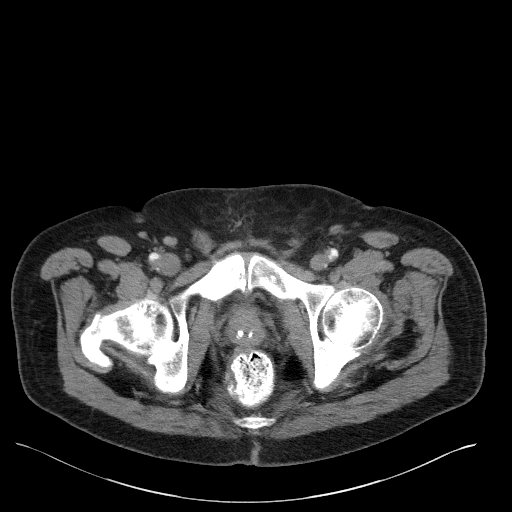
[im 15/130  bone]
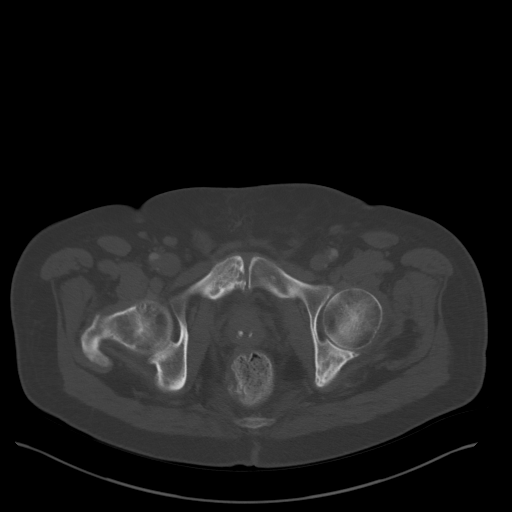
[im 29/130  mediastinal]
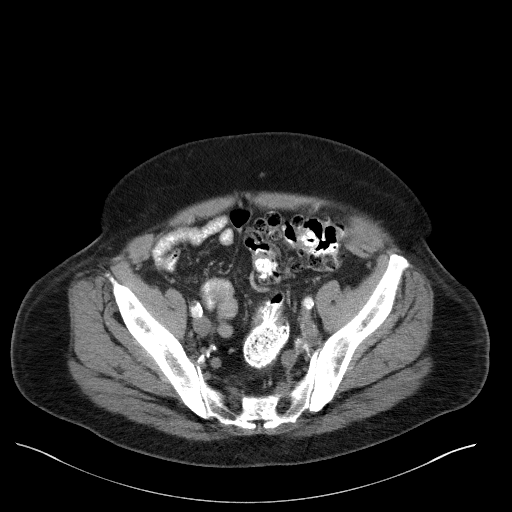
[im 44/130  mediastinal]
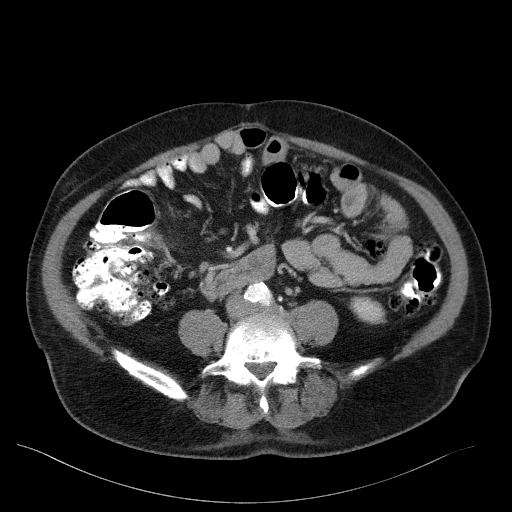
[im 58/130  mediastinal]
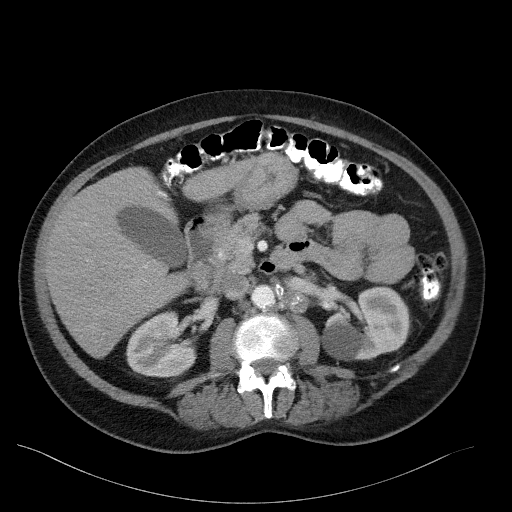
[im 72/130  mediastinal]
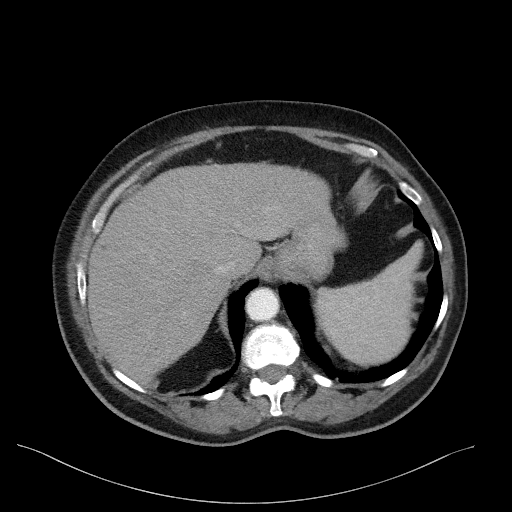
[im 87/130  mediastinal]
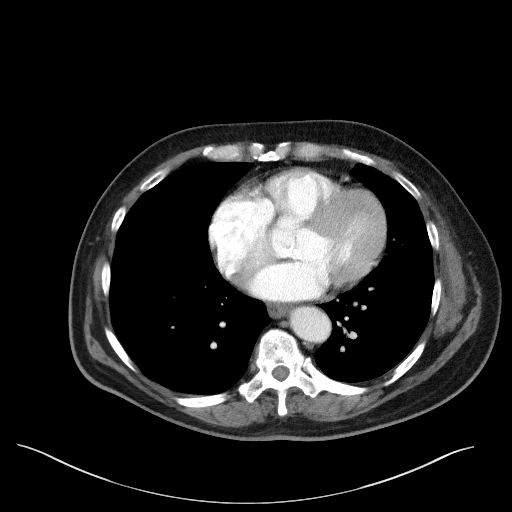
[im 101/130  mediastinal]
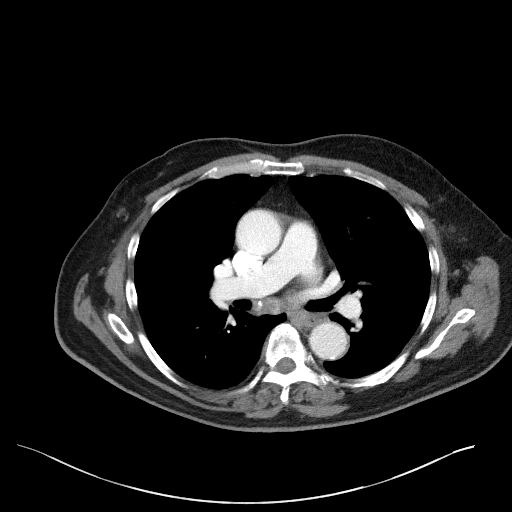
[im 115/130  mediastinal]
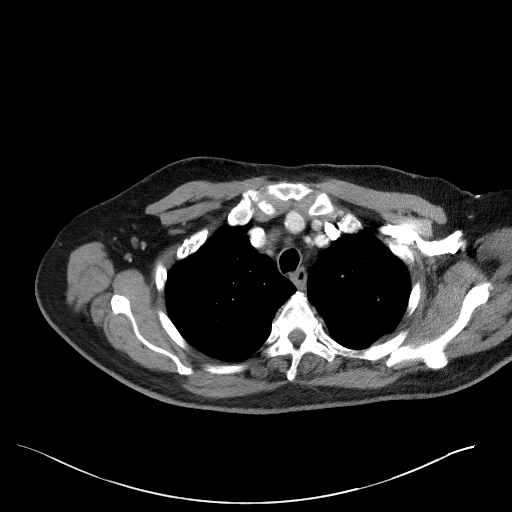

[Series 4: coronals · coronal · 0.90mm/px · 3 of 160 slices shown]
[im 32/160  mediastinal]
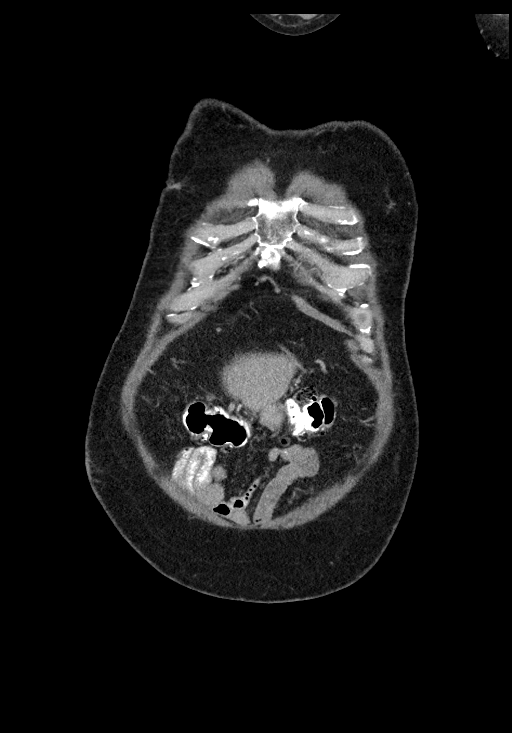
[im 64/160  mediastinal]
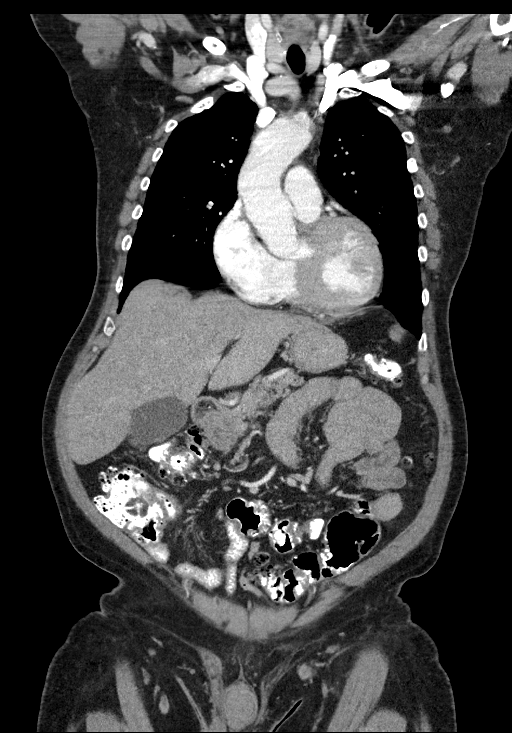
[im 96/160  mediastinal]
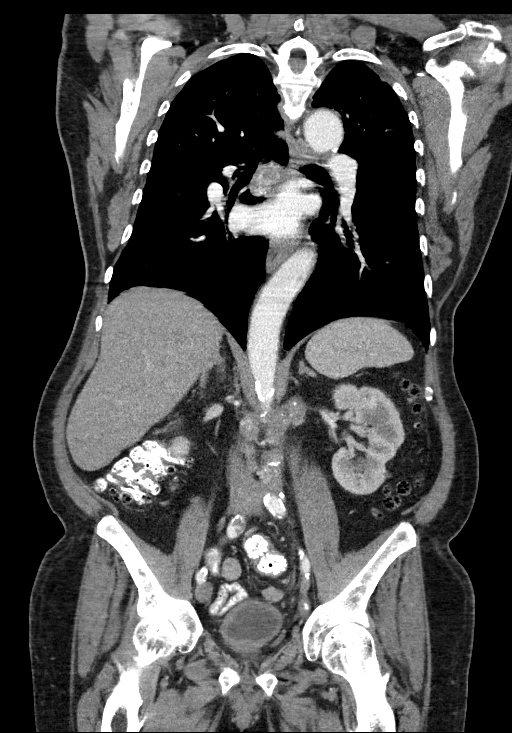

[Series 6: lung · axial · 0.84mm/px · z∈[-310,-236]mm · 3 of 162 slices shown]
[im 13/162  bone]
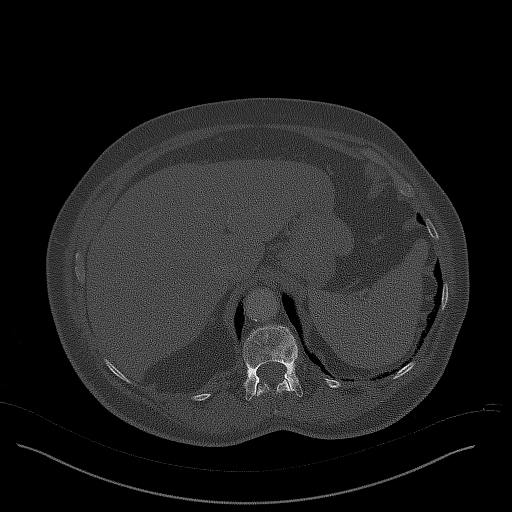
[im 38/162  bone]
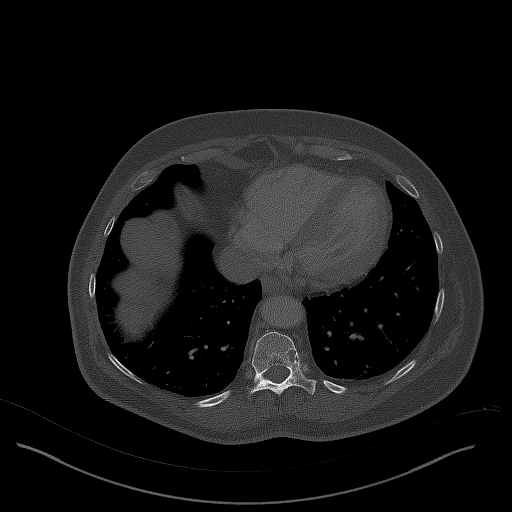
[im 50/162  bone]
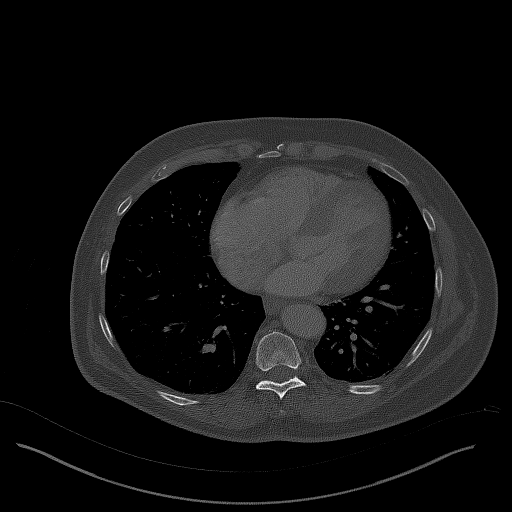

[14 of 36 positions shown; findings below may reference images not displayed]

FINDINGS: CT CHEST FINDINGS

Cardiovascular: Coronary, aortic arch, and branch vessel
atherosclerotic vascular disease. Mild aortic valve calcifications.

Mediastinum/Nodes: Speckled calcifications in confluent subcarinal
lymph nodes measuring up to 1.4 cm in short axis, borderline
prominent for this location.

Lungs/Pleura: Centrilobular emphysema.

Musculoskeletal: Old healed fracture the right medial clavicle.

Abnormal confluent small lucent lesions anteriorly in the right
second rib for example on image 42 of series 6, probably with
associated accentuated activity on the prior bone scan from
06/05/2018 in this region.

Abnormal lucency and potential cortical destruction involving the
left humeral head on image 17 series 6, suspicious for metastatic
lesion.

Small sclerotic lesion anteriorly in the left fourth rib on image 73
series 6. Small sclerotic lesion posteriorly in the left ninth rib
on image 98 series 6.

Nonspecific sclerosis along the inferior endplate of T5 eccentric to
the left, which could be due to Schmorl's node and degenerative disc
disease.

Sclerotic lesion eccentric to the right along the T11 vertebral
endplate, with a large lucent lesion eccentric to the left which
could be from a large Schmorl's node and which is stable from
06/05/2018.

CT ABDOMEN PELVIS FINDINGS

Hepatobiliary: Stable 0.4 cm hypodense lesion in the right hepatic
lobe on image 59 series 2 is too small to characterize but probably
benign. Gallbladder unremarkable. No biliary dilatation.

Pancreas: Unremarkable

Spleen: Unremarkable

Adrenals/Urinary Tract: Subtle nodularity of the adrenal glands
without discrete mass, similar to the prior exam. Nondistended
urinary bladder. Left mid kidney fluid density cyst.

Stomach/Bowel: Descending and sigmoid colon diverticulosis.
Scattered diverticula elsewhere in the colon and concentrated
diverticulosis in the cecum.

As before there is some localized wall thickening in the terminal
ileum as on image 92 of series 2, polyp or a mass cannot be
excluded, although the appearance is not substantially progressive.

Vascular/Lymphatic: Aortoiliac atherosclerotic vascular disease.
Partially calcified retroperitoneal lymph nodes include a left
periaortic lymph node measuring 1.8 cm in short axis on image 73
series 2 (formerly 2.1 cm) and a left lower periaortic lymph node
measuring 1.7 cm in short axis on image 84 series 2 (formerly
cm). Partially calcified left pelvic sidewall lymph node 1.6 cm in
short axis on image 107 of series 2, formerly 1.7 cm.

Reproductive: Small prostate gland noted with dystrophic central
calcification.

Other: No supplemental non-categorized findings.

Musculoskeletal: New or substantially increased sclerotic lesion of
the left ischium measuring 4.4 by 2.2 cm on image 125 series 2.

Similar sclerotic lesions in the right pubic body and right superior
pubic ramus as well as along the left femoral neck.

New or substantially increased right posterior iliac bone sclerotic
lesion, 2.3 by 1.8 cm on image 95 series 2.

Primarily lytic lesion involving the left posterior vertebral body
and posterior elements of L3 appears similar to [DATE] [DATE]. Other
small sclerotic lesions in the lumbar spine are stable.
IMPRESSION: 1. Mixed appearance, with mildly improved retroperitoneal and left
pelvic sidewall adenopathy, but with new and substantially increased
sclerotic osseous metastatic lesions compatible with progressive
malignancy.
2. Upper normal sized partially calcified subcarinal lymph nodes may
well represent metastatic disease given their similarity in
appearance to the calcified abdominal and pelvic lymph nodes. The
subcarinal region has not been previously characterized (no prior
chest CT).
3. Stable localized wall thickening in the terminal ileum, polyp or
a mass cannot be excluded, although the appearance is not
substantially progressive compared to 06/05/2018.
4. Other imaging findings of potential clinical significance:
Coronary atherosclerosis. Mild aortic valve calcifications.
Centrilobular emphysema. Descending and sigmoid colon
diverticulosis. Left mid kidney fluid density cyst. Old healed right
medial clavicle fracture.
5. Emphysema and aortic atherosclerosis.

Aortic Atherosclerosis (1UNAS-W1P.P) and Emphysema (1UNAS-M9K.J).

## 2021-04-18 ENCOUNTER — Other Ambulatory Visit (HOSPITAL_COMMUNITY): Payer: Self-pay

## 2021-04-18 ENCOUNTER — Other Ambulatory Visit: Payer: Self-pay | Admitting: Oncology

## 2021-04-18 DIAGNOSIS — C61 Malignant neoplasm of prostate: Secondary | ICD-10-CM

## 2021-04-19 ENCOUNTER — Encounter: Payer: Self-pay | Admitting: Oncology

## 2021-04-19 ENCOUNTER — Other Ambulatory Visit (HOSPITAL_COMMUNITY): Payer: Self-pay

## 2021-04-19 MED ORDER — ENZALUTAMIDE 40 MG PO TABS
ORAL_TABLET | ORAL | 0 refills | Status: DC
  Filled 2021-04-19: qty 120, 30d supply, fill #0

## 2021-04-21 ENCOUNTER — Other Ambulatory Visit: Payer: Self-pay | Admitting: Nurse Practitioner

## 2021-04-21 DIAGNOSIS — C61 Malignant neoplasm of prostate: Secondary | ICD-10-CM

## 2021-04-22 ENCOUNTER — Other Ambulatory Visit: Payer: Self-pay | Admitting: Nurse Practitioner

## 2021-04-22 ENCOUNTER — Telehealth: Payer: Self-pay | Admitting: Oncology

## 2021-04-22 DIAGNOSIS — C61 Malignant neoplasm of prostate: Secondary | ICD-10-CM

## 2021-04-22 NOTE — Telephone Encounter (Signed)
Called patient regarding upcoming appointment, patient is notified. °

## 2021-04-24 ENCOUNTER — Other Ambulatory Visit: Payer: Self-pay | Admitting: Nurse Practitioner

## 2021-04-24 DIAGNOSIS — E119 Type 2 diabetes mellitus without complications: Secondary | ICD-10-CM

## 2021-04-29 ENCOUNTER — Other Ambulatory Visit: Payer: Self-pay

## 2021-04-29 ENCOUNTER — Inpatient Hospital Stay: Payer: Medicare PPO | Admitting: Oncology

## 2021-04-29 ENCOUNTER — Inpatient Hospital Stay: Payer: Medicare PPO

## 2021-04-29 ENCOUNTER — Inpatient Hospital Stay: Payer: Medicare PPO | Attending: Oncology

## 2021-04-29 VITALS — BP 136/74 | HR 77 | Temp 97.9°F | Resp 18 | Ht 68.0 in | Wt 205.2 lb

## 2021-04-29 DIAGNOSIS — C61 Malignant neoplasm of prostate: Secondary | ICD-10-CM

## 2021-04-29 DIAGNOSIS — Z79899 Other long term (current) drug therapy: Secondary | ICD-10-CM | POA: Insufficient documentation

## 2021-04-29 DIAGNOSIS — Z79818 Long term (current) use of other agents affecting estrogen receptors and estrogen levels: Secondary | ICD-10-CM | POA: Diagnosis not present

## 2021-04-29 DIAGNOSIS — Z7982 Long term (current) use of aspirin: Secondary | ICD-10-CM | POA: Insufficient documentation

## 2021-04-29 DIAGNOSIS — C779 Secondary and unspecified malignant neoplasm of lymph node, unspecified: Secondary | ICD-10-CM | POA: Diagnosis not present

## 2021-04-29 LAB — CBC WITH DIFFERENTIAL (CANCER CENTER ONLY)
Abs Immature Granulocytes: 0.03 10*3/uL (ref 0.00–0.07)
Basophils Absolute: 0 10*3/uL (ref 0.0–0.1)
Basophils Relative: 0 %
Eosinophils Absolute: 0.3 10*3/uL (ref 0.0–0.5)
Eosinophils Relative: 3 %
HCT: 42.4 % (ref 39.0–52.0)
Hemoglobin: 13.9 g/dL (ref 13.0–17.0)
Immature Granulocytes: 0 %
Lymphocytes Relative: 20 %
Lymphs Abs: 2 10*3/uL (ref 0.7–4.0)
MCH: 33.5 pg (ref 26.0–34.0)
MCHC: 32.8 g/dL (ref 30.0–36.0)
MCV: 102.2 fL — ABNORMAL HIGH (ref 80.0–100.0)
Monocytes Absolute: 0.7 10*3/uL (ref 0.1–1.0)
Monocytes Relative: 6 %
Neutro Abs: 7.4 10*3/uL (ref 1.7–7.7)
Neutrophils Relative %: 71 %
Platelet Count: 163 10*3/uL (ref 150–400)
RBC: 4.15 MIL/uL — ABNORMAL LOW (ref 4.22–5.81)
RDW: 14.6 % (ref 11.5–15.5)
WBC Count: 10.4 10*3/uL (ref 4.0–10.5)
nRBC: 0 % (ref 0.0–0.2)

## 2021-04-29 LAB — CMP (CANCER CENTER ONLY)
ALT: 7 U/L (ref 0–44)
AST: 11 U/L — ABNORMAL LOW (ref 15–41)
Albumin: 3.9 g/dL (ref 3.5–5.0)
Alkaline Phosphatase: 71 U/L (ref 38–126)
Anion gap: 9 (ref 5–15)
BUN: 19 mg/dL (ref 8–23)
CO2: 28 mmol/L (ref 22–32)
Calcium: 9.3 mg/dL (ref 8.9–10.3)
Chloride: 103 mmol/L (ref 98–111)
Creatinine: 0.97 mg/dL (ref 0.61–1.24)
GFR, Estimated: >60 mL/min (ref >60)
Glucose, Bld: 93 mg/dL (ref 70–99)
Potassium: 3.6 mmol/L (ref 3.5–5.1)
Sodium: 140 mmol/L (ref 135–145)
Total Bilirubin: 0.3 mg/dL (ref 0.3–1.2)
Total Protein: 7.4 g/dL (ref 6.5–8.1)

## 2021-04-29 MED ORDER — LEUPROLIDE ACETATE (4 MONTH) 30 MG ~~LOC~~ KIT
30.0000 mg | PACK | Freq: Once | SUBCUTANEOUS | Status: AC
  Administered 2021-04-29: 30 mg via SUBCUTANEOUS
  Filled 2021-04-29: qty 30

## 2021-04-29 NOTE — Progress Notes (Signed)
Hematology and Oncology Follow Up Visit ? ?Kyle George ?[id number] ?[birth year] 71 y.o. ?04/29/2021 2:25 PM ?Kyle George, Kyle Fortes, [name]  ? 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  He presented with advanced disease in 2015, Gleason score 4+5 = 9  and PSA of 397. ? ?Prior Therapy: ?Status post androgen deprivation and initially with [place] subsequently with Lupron with a PSA dropping down to 500 and December 2015. ? ?He failed to follow-up with the PSA rising up to 1400 and April 2018. ? ? ?Zytiga 1000 mg daily with prednisone at 5 mg daily started in August 2018. ? ? ?Current therapy:  ?Eligard 30 mg every 4 months.  He will receive Eligard today and repeated in 4 months. ? ?Xtandi 160 mg daily started on March 01, 2020. ? ?Interim History:  [name] is here for repeat evaluation.  Since last visit, he reports feeling well without any major complaints.  He denies any recent hospitalizations or illnesses.  He denies any nausea, vomiting or abdominal pain.  He denies any complications related to [facility].  He denies excessive fatigue or weakness.  He denies any hematuria or dysuria.  He denies any changes in his mentation. ? ? ? ? ?Medications: Updated on review. ?Current Outpatient Medications  ?Medication Sig Dispense Refill  ? aspirin 81 MG tablet Take 81 mg by mouth daily.    ? B-D ULTRAFINE III SHORT PEN 31G X 8 MM MISC USE AS DIRECTED WITH LEVEMIR PEN 100 each 5  ? blood glucose meter kit and supplies KIT Dispense based on patient and insurance preference. Use up to four times daily as directed. 1 each 0  ? Blood Pressure Monitoring (BLOOD PRESSURE CUFF) MISC by Does not apply route. Check blood pressure at least 4 times per week 3 hours after taking medication    ? Calcium Carbonate-Vitamin D (CALCIUM 600+D PO) Take 1 tablet by mouth daily.    ? carvedilol (COREG) 6.25 MG tablet TAKE 1 TABLET BY MOUTH TWICE A DAY WITH  FOOD 60 tablet 0  ? cholecalciferol (VITAMIN D) 1000 UNITS tablet Take 5,000 Units by mouth daily.    ? enzalutamide (XTANDI) 40 MG tablet TAKE 4 TABLETS (160 MG TOTAL) BY MOUTH DAILY. 120 tablet 0  ? glucose blood test strip 1 each by Other route as needed for other. Use as instructed to check blood sugars    ? insulin detemir (LEVEMIR FLEXTOUCH) 100 UNIT/ML FlexPen Inject 15 Units into the skin daily. 15 mL 1  ? Insulin Syringe-Needle U-100 31G X 5/16" 0.3 ML MISC by Does not apply route. Use as directed with levemir pen    ? JANUVIA 100 MG tablet TAKE 1 TABLET BY MOUTH EVERY DAY 90 tablet 0  ? Lancets (ONETOUCH DELICA PLUS [id number]) MISC by Does not apply route. Use as directed to check blood  Sugars 2 times per day dx E11.65    ? Multiple Vitamin (MULTIVITAMIN WITH MINERALS) TABS Take 1 tablet by mouth daily.    ? NON FORMULARY Take 1 tablet by mouth daily. Thyroid  action    ? Olmesartan-amLODIPine-HCTZ 40-10-25 MG TABS TAKE 1 TABLET BY MOUTH EVERY DAY 90 tablet 1  ? Omega-3 Fatty Acids (FISH OIL PO) Take 1 tablet by mouth once.    ? pioglitazone-metformin (ACTOPLUS MET) 15-850 MG tablet TAKE 1 TABLET BY MOUTH TWICE A DAY 180 tablet 1  ? predniSONE (DELTASONE) 5 MG tablet TAKE 1 TABLET BY MOUTH EVERY DAY  WITH BREAKFAST (Patient not taking: Reported on 12/15/2020) 30 tablet 1  ? rosuvastatin (CRESTOR) 10 MG tablet TAKE 1 TABLET BY MOUTH EVERY DAY 90 tablet 1  ? spironolactone (ALDACTONE) 25 MG tablet TAKE 1 TABLET BY MOUTH EVERY DAY 90 tablet 1  ? ?No current facility-administered medications for this visit.  ? ? ? ?Allergies:  ?No Known Allergies ? ? ? ? ?Physical Exam: ? ? ? ? ?Blood pressure 136/74, pulse 77, temperature 97.9 ?F (36.6 ?C), temperature source Temporal, resp. rate 18, height '5\' 8"'  (1.727 m), weight 205 lb 3.2 oz (93.1 kg), SpO2 100 %. ? ? ? ? ? ? ?ECOG: 0 ? ? ?General appearance: Alert, awake without any distress. ?Head: Atraumatic without abnormalities ?Oropharynx: Without any thrush or  ulcers. ?Eyes: No scleral icterus. ?Lymph nodes: No lymphadenopathy noted in the cervical, supraclavicular, or axillary nodes ?Heart:regular rate and rhythm, without any murmurs or gallops.   ?Lung: Clear to auscultation without any rhonchi, wheezes or dullness to percussion. ?Abdomin: Soft, nontender without any shifting dullness or ascites. ?Musculoskeletal: No clubbing or cyanosis. ?Neurological: No motor or sensory deficits. ?Skin: No rashes or lesions. ? ? ? ? ? ? ? ? ? ? ? ? ? ? ?Lab Results: ?Lab Results  ?Component Value Date  ? WBC 6.1 03/29/2021  ? HGB 13.5 03/29/2021  ? HCT 39.0 03/29/2021  ? MCV 97 03/29/2021  ? PLT 138 (L) 03/29/2021  ? ?   ?   ?  ? ? ? ? Latest Reference Range & Units 11/12/20 09:38  ?Prostate Specific Ag, Serum 0.0 - 4.0 ng/mL <0.1  ? ? ? ? ? ? ? ? ?71 year old man with: ? ?1.  Advanced prostate cancer with lymphadenopathy diagnosed in 2015.  He has castration-resistant disease at this time. ? ? ?He continues to tolerate Xtandi reasonably well without any major complications.  His PSA is currently undetectable.  Risks and benefits of continuing this treatment were reviewed at this time.  Complications that include hypertension, fatigue among others were reiterated.  He is agreeable to continue. ? ? ?2. Androgen depravation: He will receive Eligard today and repeated in 4 months.  Complications including weight gain, hot flashes among others were reviewed. ? ? ?3. Bone health: He is to continue calcium vitamin D supplements.  Delton See has been deferred due to his preferences. ? ?5.  Hypertension: His blood pressure is within normal range at this time without any issues. ? ?6.  Prognosis and goals of care: His disease is incurable although aggressive measures are warranted given his reasonable performance status. ? ?7. Follow-up: In 4 months for repeat follow-up. ? ? ?30 minutes were spent on this visit.  The time was dedicated to reviewing laboratory data, disease status update and  outlining future plan of care discussion. ? ?Kyle Button, MD ?4/21/20232:25 PM ?

## 2021-04-30 LAB — PROSTATE-SPECIFIC AG, SERUM (LABCORP): Prostate Specific Ag, Serum: <0.1 ng/mL (ref 0.0–4.0)

## 2021-05-02 ENCOUNTER — Telehealth: Payer: Self-pay

## 2021-05-02 NOTE — Telephone Encounter (Signed)
Pt advised with VU 

## 2021-05-02 NOTE — Telephone Encounter (Signed)
-----   Message from Wyatt Portela, MD sent at 05/02/2021  9:20 AM EDT ----- ?Please let him know his PSA is still low ?

## 2021-05-10 ENCOUNTER — Other Ambulatory Visit (HOSPITAL_COMMUNITY): Payer: Self-pay

## 2021-05-12 ENCOUNTER — Other Ambulatory Visit (HOSPITAL_COMMUNITY): Payer: Self-pay

## 2021-05-16 ENCOUNTER — Other Ambulatory Visit (HOSPITAL_COMMUNITY): Payer: Self-pay

## 2021-05-16 ENCOUNTER — Other Ambulatory Visit: Payer: Self-pay | Admitting: Nurse Practitioner

## 2021-05-16 DIAGNOSIS — C61 Malignant neoplasm of prostate: Secondary | ICD-10-CM

## 2021-05-17 ENCOUNTER — Other Ambulatory Visit (HOSPITAL_COMMUNITY): Payer: Self-pay

## 2021-05-17 ENCOUNTER — Other Ambulatory Visit: Payer: Self-pay | Admitting: Oncology

## 2021-05-17 DIAGNOSIS — C61 Malignant neoplasm of prostate: Secondary | ICD-10-CM

## 2021-05-17 MED ORDER — ENZALUTAMIDE 40 MG PO TABS
ORAL_TABLET | ORAL | 0 refills | Status: DC
  Filled 2021-05-17: qty 120, 30d supply, fill #0

## 2021-05-19 ENCOUNTER — Other Ambulatory Visit (HOSPITAL_COMMUNITY): Payer: Self-pay

## 2021-05-30 ENCOUNTER — Telehealth: Payer: Self-pay | Admitting: Pharmacy Technician

## 2021-05-30 ENCOUNTER — Other Ambulatory Visit (HOSPITAL_COMMUNITY): Payer: Self-pay

## 2021-05-30 NOTE — Telephone Encounter (Signed)
Oral Oncology Patient Advocate Encounter  Prior Authorization for [facility] has been approved.    PA# [id number] Effective dates: 06/19/2021 through 11/26/2021   Received notification that prior authorization for [facility] is due for renewal.   PA submitted on 05/30/2021 PA ID: [id number] Status is pending     Lady Deutscher, CPhT-Adv Pharmacy Patient Advocate Specialist [place] Patient Advocate Team Direct Number: [phone]  Fax: [phone]

## 2021-05-31 ENCOUNTER — Other Ambulatory Visit (HOSPITAL_COMMUNITY): Payer: Self-pay

## 2021-06-08 ENCOUNTER — Other Ambulatory Visit: Payer: Self-pay | Admitting: Nurse Practitioner

## 2021-06-08 ENCOUNTER — Ambulatory Visit: Payer: Medicare PPO | Admitting: Nurse Practitioner

## 2021-06-08 ENCOUNTER — Ambulatory Visit: Payer: Medicare PPO

## 2021-06-08 DIAGNOSIS — C61 Malignant neoplasm of prostate: Secondary | ICD-10-CM

## 2021-06-13 ENCOUNTER — Other Ambulatory Visit (HOSPITAL_COMMUNITY): Payer: Self-pay

## 2021-06-13 ENCOUNTER — Other Ambulatory Visit: Payer: Self-pay | Admitting: Oncology

## 2021-06-13 DIAGNOSIS — C61 Malignant neoplasm of prostate: Secondary | ICD-10-CM

## 2021-06-14 ENCOUNTER — Other Ambulatory Visit: Payer: Self-pay | Admitting: Nurse Practitioner

## 2021-06-14 ENCOUNTER — Other Ambulatory Visit (HOSPITAL_COMMUNITY): Payer: Self-pay

## 2021-06-14 ENCOUNTER — Encounter: Payer: Self-pay | Admitting: Oncology

## 2021-06-14 MED ORDER — ENZALUTAMIDE 40 MG PO TABS
ORAL_TABLET | ORAL | 0 refills | Status: DC
  Filled 2021-06-14: qty 120, 30d supply, fill #0

## 2021-06-17 ENCOUNTER — Other Ambulatory Visit (HOSPITAL_COMMUNITY): Payer: Self-pay

## 2021-06-18 ENCOUNTER — Other Ambulatory Visit: Payer: Self-pay | Admitting: Nurse Practitioner

## 2021-06-18 DIAGNOSIS — E119 Type 2 diabetes mellitus without complications: Secondary | ICD-10-CM

## 2021-06-20 ENCOUNTER — Telehealth: Payer: Self-pay | Admitting: Nurse Practitioner

## 2021-06-20 ENCOUNTER — Other Ambulatory Visit: Payer: Self-pay | Admitting: Nurse Practitioner

## 2021-06-20 DIAGNOSIS — E119 Type 2 diabetes mellitus without complications: Secondary | ICD-10-CM

## 2021-06-20 NOTE — Telephone Encounter (Signed)
Left message for patient to call back and schedule Medicare Annual Wellness Visit (AWV) either virtually or in office.  Left both my jabber number [phone] and office number    Last AWV ;05/27/20  please schedule at anytime with [facility]

## 2021-06-27 ENCOUNTER — Telehealth: Payer: Self-pay | Admitting: Oncology

## 2021-06-27 ENCOUNTER — Other Ambulatory Visit: Payer: Self-pay | Admitting: Nurse Practitioner

## 2021-06-27 DIAGNOSIS — E782 Mixed hyperlipidemia: Secondary | ICD-10-CM

## 2021-06-27 NOTE — Telephone Encounter (Signed)
Called patient regarding upcoming August appointment, patient has been called and notified.  

## 2021-07-06 ENCOUNTER — Telehealth: Payer: Self-pay | Admitting: Oncology

## 2021-07-06 ENCOUNTER — Other Ambulatory Visit: Payer: Self-pay | Admitting: Nurse Practitioner

## 2021-07-06 DIAGNOSIS — C61 Malignant neoplasm of prostate: Secondary | ICD-10-CM

## 2021-07-06 NOTE — Telephone Encounter (Signed)
Called patient regarding upcoming August appointment, patient is notified. 

## 2021-07-07 ENCOUNTER — Other Ambulatory Visit (HOSPITAL_COMMUNITY): Payer: Self-pay

## 2021-07-07 ENCOUNTER — Other Ambulatory Visit: Payer: Self-pay | Admitting: Oncology

## 2021-07-07 DIAGNOSIS — C61 Malignant neoplasm of prostate: Secondary | ICD-10-CM

## 2021-07-07 MED ORDER — ENZALUTAMIDE 40 MG PO TABS
ORAL_TABLET | ORAL | 0 refills | Status: DC
  Filled 2021-07-07: qty 120, 30d supply, fill #0

## 2021-07-14 ENCOUNTER — Telehealth: Payer: Self-pay | Admitting: Nurse Practitioner

## 2021-07-14 NOTE — Telephone Encounter (Signed)
Left message for patient to call back and schedule Medicare Annual Wellness Visit (AWV) in office.   If not able to come in office, please offer to do virtually or by telephone.  Left office number.  Last AWV:05/27/2020  Please schedule at anytime with Nurse Health Advisor.

## 2021-07-20 ENCOUNTER — Other Ambulatory Visit (HOSPITAL_COMMUNITY): Payer: Self-pay

## 2021-07-20 ENCOUNTER — Other Ambulatory Visit: Payer: Self-pay | Admitting: Nurse Practitioner

## 2021-07-20 DIAGNOSIS — C61 Malignant neoplasm of prostate: Secondary | ICD-10-CM

## 2021-08-05 ENCOUNTER — Other Ambulatory Visit (HOSPITAL_COMMUNITY): Payer: Self-pay

## 2021-08-10 ENCOUNTER — Ambulatory Visit (INDEPENDENT_AMBULATORY_CARE_PROVIDER_SITE_OTHER): Payer: Medicare PPO

## 2021-08-10 ENCOUNTER — Other Ambulatory Visit (HOSPITAL_COMMUNITY): Payer: Self-pay

## 2021-08-10 ENCOUNTER — Other Ambulatory Visit: Payer: Self-pay | Admitting: Oncology

## 2021-08-10 VITALS — Ht 69.0 in | Wt 200.0 lb

## 2021-08-10 DIAGNOSIS — Z0000 Encounter for general adult medical examination without abnormal findings: Secondary | ICD-10-CM | POA: Diagnosis not present

## 2021-08-10 DIAGNOSIS — C61 Malignant neoplasm of prostate: Secondary | ICD-10-CM

## 2021-08-10 MED ORDER — ENZALUTAMIDE 40 MG PO TABS
ORAL_TABLET | ORAL | 0 refills | Status: DC
  Filled 2021-08-10: qty 120, 30d supply, fill #0

## 2021-08-10 NOTE — Patient Instructions (Signed)
[name] , Thank you for taking time to come for your Medicare Wellness Visit. I appreciate your ongoing commitment to your health goals. Please review the following plan we discussed and let me know if I can assist you in the future.   Screening recommendations/referrals: Colonoscopy: completed 05/23/2019, due 05/23/2022 Recommended yearly ophthalmology/optometry visit for glaucoma screening and checkup Recommended yearly dental visit for hygiene and checkup  Vaccinations: Influenza vaccine: due  Pneumococcal vaccine: decline Tdap vaccine: completed 04/25/2013, due 04/26/2023 Shingles vaccine: discussed    Covid-19:  01/01/2020, 05/09/2019, 04/11/2019  Advanced directives: Advance directive discussed with you today.   Conditions/risks identified: smoking  Next appointment: Follow up in one year for your annual wellness visit.   Preventive Care 72 Years and Older, Male Preventive care refers to lifestyle choices and visits with your health care provider that can promote health and wellness. What does preventive care include? A yearly physical exam. This is also called an annual well check. Dental exams once or twice a year. Routine eye exams. Ask your health care provider how often you should have your eyes checked. Personal lifestyle choices, including: Daily care of your teeth and gums. Regular physical activity. Eating a healthy diet. Avoiding tobacco and drug use. Limiting alcohol use. Practicing safe sex. Taking low doses of aspirin every day. Taking vitamin and mineral supplements as recommended by your health care provider. What happens during an annual well check? The services and screenings done by your health care provider during your annual well check will depend on your age, overall health, lifestyle risk factors, and family history of disease. Counseling  Your health care provider may ask you questions about your: Alcohol use. Tobacco use. Drug use. Emotional  well-being. Home and relationship well-being. Sexual activity. Eating habits. History of falls. Memory and ability to understand (cognition). Work and work Statistician. Screening  You may have the following tests or measurements: Height, weight, and BMI. Blood pressure. Lipid and cholesterol levels. These may be checked every 5 years, or more frequently if you are over 53 years old. Skin check. Lung cancer screening. You may have this screening every year starting at age 52 if you have a 30-pack-year history of smoking and currently smoke or have quit within the past 15 years. Fecal occult blood test (FOBT) of the stool. You may have this test every year starting at age 27. Flexible sigmoidoscopy or colonoscopy. You may have a sigmoidoscopy every 5 years or a colonoscopy every 10 years starting at age 53. Prostate cancer screening. Recommendations will vary depending on your family history and other risks. Hepatitis C blood test. Hepatitis B blood test. Sexually transmitted disease (STD) testing. Diabetes screening. This is done by checking your blood sugar (glucose) after you have not eaten for a while (fasting). You may have this done every 1-3 years. Abdominal aortic aneurysm (AAA) screening. You may need this if you are a current or former smoker. Osteoporosis. You may be screened starting at age 47 if you are at high risk. Talk with your health care provider about your test results, treatment options, and if necessary, the need for more tests. Vaccines  Your health care provider may recommend certain vaccines, such as: Influenza vaccine. This is recommended every year. Tetanus, diphtheria, and acellular pertussis (Tdap, Td) vaccine. You may need a Td booster every 10 years. Zoster vaccine. You may need this after age 23. Pneumococcal 13-valent conjugate (PCV13) vaccine. One dose is recommended after age 63. Pneumococcal polysaccharide (PPSV23) vaccine. One dose  is recommended after  age 66. Talk to your health care provider about which screenings and vaccines you need and how often you need them. This information is not intended to replace advice given to you by your health care provider. Make sure you discuss any questions you have with your health care provider. Document Released: 01/22/2015 Document Revised: 09/15/2015 Document Reviewed: 10/27/2014 Elsevier Interactive Patient Education  2017 [place] Prevention in the Home Falls can cause injuries. They can happen to people of all ages. There are many things you can do to make your home safe and to help prevent falls. What can I do on the outside of my home? Regularly fix the edges of walkways and driveways and fix any cracks. Remove anything that might make you trip as you walk through a door, such as a raised step or threshold. Trim any bushes or trees on the path to your home. Use bright outdoor lighting. Clear any walking paths of anything that might make someone trip, such as rocks or tools. Regularly check to see if handrails are loose or broken. Make sure that both sides of any steps have handrails. Any raised decks and porches should have guardrails on the edges. Have any leaves, snow, or ice cleared regularly. Use sand or salt on walking paths during winter. Clean up any spills in your garage right away. This includes oil or grease spills. What can I do in the bathroom? Use night lights. Install grab bars by the toilet and in the tub and shower. Do not use towel bars as grab bars. Use non-skid mats or decals in the tub or shower. If you need to sit down in the shower, use a plastic, non-slip stool. Keep the floor dry. Clean up any water that spills on the floor as soon as it happens. Remove soap buildup in the tub or shower regularly. Attach bath mats securely with double-sided non-slip rug tape. Do not have throw rugs and other things on the floor that can make you trip. What can I do in the  bedroom? Use night lights. Make sure that you have a light by your bed that is easy to reach. Do not use any sheets or blankets that are too big for your bed. They should not hang down onto the floor. Have a firm chair that has side arms. You can use this for support while you get dressed. Do not have throw rugs and other things on the floor that can make you trip. What can I do in the kitchen? Clean up any spills right away. Avoid walking on wet floors. Keep items that you use a lot in easy-to-reach places. If you need to reach something above you, use a strong step stool that has a grab bar. Keep electrical cords out of the way. Do not use floor polish or wax that makes floors slippery. If you must use wax, use non-skid floor wax. Do not have throw rugs and other things on the floor that can make you trip. What can I do with my stairs? Do not leave any items on the stairs. Make sure that there are handrails on both sides of the stairs and use them. Fix handrails that are broken or loose. Make sure that handrails are as long as the stairways. Check any carpeting to make sure that it is firmly attached to the stairs. Fix any carpet that is loose or worn. Avoid having throw rugs at the top or bottom of the stairs.  If you do have throw rugs, attach them to the floor with carpet tape. Make sure that you have a light switch at the top of the stairs and the bottom of the stairs. If you do not have them, ask someone to add them for you. What else can I do to help prevent falls? Wear shoes that: Do not have high heels. Have rubber bottoms. Are comfortable and fit you well. Are closed at the toe. Do not wear sandals. If you use a stepladder: Make sure that it is fully opened. Do not climb a closed stepladder. Make sure that both sides of the stepladder are locked into place. Ask someone to hold it for you, if possible. Clearly mark and make sure that you can see: Any grab bars or  handrails. First and last steps. Where the edge of each step is. Use tools that help you move around (mobility aids) if they are needed. These include: Canes. Walkers. Scooters. Crutches. Turn on the lights when you go into a dark area. Replace any light bulbs as soon as they burn out. Set up your furniture so you have a clear path. Avoid moving your furniture around. If any of your floors are uneven, fix them. If there are any pets around you, be aware of where they are. Review your medicines with your doctor. Some medicines can make you feel dizzy. This can increase your chance of falling. Ask your doctor what other things that you can do to help prevent falls. This information is not intended to replace advice given to you by your health care provider. Make sure you discuss any questions you have with your health care provider. Document Released: 10/22/2008 Document Revised: 06/03/2015 Document Reviewed: 01/30/2014 Elsevier Interactive Patient Education  2017 [employer].

## 2021-08-10 NOTE — Progress Notes (Signed)
I connected with Kyle George today by telephone and verified that I am speaking with the correct person using two identifiers. Location patient: home Location provider: work Persons participating in the virtual visit: Kyle George, Kyle Durand LPN.   I discussed the limitations, risks, security and privacy concerns of performing an evaluation and management service by telephone and the availability of in person appointments. I also discussed with the patient that there may be a patient responsible charge related to this service. The patient expressed understanding and verbally consented to this telephonic visit.    Interactive audio and video telecommunications were attempted between this provider and patient, however failed, due to patient having technical difficulties OR patient did not have access to video capability.  We continued and completed visit with audio only.     Vital signs may be patient reported or missing.  Subjective:   Kyle George is a 71 y.o. male who presents for Medicare Annual/Subsequent preventive examination.  Review of Systems     Cardiac Risk Factors include: advanced age (>65mn, >>85women);diabetes mellitus;dyslipidemia;hypertension;male gender     Objective:    Today's Vitals   08/10/21 1217  Weight: 200 lb (90.7 kg)  Height: _0  (1.753 m)   Body mass index is 29.53 kg/m.     08/10/2021   12:22 PM 05/27/2020    4:19 PM 10/17/2019   12:06 PM 06/18/2019    9:34 AM 03/20/2019    3:26 PM 02/18/2019    9:47 AM 12/03/2018    3:32 PM  Advanced Directives  Does Patient Have a Medical Advance Directive? _1  No No  Would patient like information on creating a medical advance directive?   No - Patient declined  Yes (MAU/Ambulatory/Procedural Areas - Information given) No - Patient declined No - Patient declined    Current Medications (verified) Outpatient Encounter Medications as of 08/10/2021  Medication Sig   aspirin 81 MG tablet Take 81  mg by mouth daily.   B-D ULTRAFINE III SHORT PEN 31G X 8 MM MISC USE AS DIRECTED WITH LEVEMIR PEN   blood glucose meter kit and supplies KIT Dispense based on patient and insurance preference. Use up to four times daily as directed.   Blood Pressure Monitoring (BLOOD PRESSURE CUFF) MISC by Does not apply route. Check blood pressure at least 4 times per week 3 hours after taking medication   Calcium Carbonate-Vitamin D (CALCIUM 600+D PO) Take 1 tablet by mouth daily.   carvedilol (COREG) 6.25 MG tablet TAKE 1 TABLET BY MOUTH TWICE A DAY WITH FOOD   cholecalciferol (VITAMIN D) 1000 UNITS tablet Take 5,000 Units by mouth daily.   enzalutamide (XTANDI) 40 MG tablet TAKE 4 TABLETS (160 MG TOTAL) BY MOUTH DAILY.   glucose blood test strip 1 each by Other route as needed for other. Use as instructed to check blood sugars   Insulin Syringe-Needle U-100 31G X 5/16" 0.3 ML MISC by Does not apply route. Use as directed with levemir pen   JANUVIA 100 MG tablet TAKE 1 TABLET BY MOUTH EVERY DAY   Lancets (ONETOUCH DELICA PLUS [id number] MP[place]y [place] Does not apply route. Use as directed to check blood  Sugars 2 times per day dx E11.65   LEVEMIR FLEXTOUCH 100 UNIT/ML FlexTouch Pen INJECT 15 UNITS INTO THE SKIN DAILY.   Multiple Vitamin (MULTIVITAMIN WITH MINERALS) TABS Take 1 tablet by mouth daily.   NON FORMULARY Take 1 tablet by mouth daily. Thyroid  action  Olmesartan-amLODIPine-HCTZ 40-10-25 MG TABS TAKE 1 TABLET BY MOUTH EVERY DAY   Omega-3 Fatty Acids (FISH OIL PO) Take 1 tablet by mouth once.   pioglitazone-metformin (ACTOPLUS MET) 15-850 MG tablet TAKE 1 TABLET BY MOUTH TWICE A DAY   rosuvastatin (CRESTOR) 10 MG tablet TAKE 1 TABLET BY MOUTH EVERY DAY   spironolactone (ALDACTONE) 25 MG tablet TAKE 1 TABLET BY MOUTH EVERY DAY   predniSONE (DELTASONE) 5 MG tablet TAKE 1 TABLET BY MOUTH EVERY DAY WITH BREAKFAST (Patient not taking: Reported on 12/15/2020)   No facility-administered encounter medications on  file as of 08/10/2021.    Allergies (verified) Patient has no known allergies.   History: Past Medical History:  Diagnosis Date   Cancer of prostate ([place])    Diabetes mellitus without complication ([place])    Hyperlipidemia    Hypertension    Past Surgical History:  Procedure Laterality Date   COLONOSCOPY     HEMORRHOID SURGERY     Family History  Problem Relation Age of Onset   Hypertension Mother    Diabetes Mother    Cancer Father    Colon cancer Neg Hx    Esophageal cancer Neg Hx    Stomach cancer Neg Hx    Rectal cancer Neg Hx    Social History   Socioeconomic History   Marital status: Married    Spouse name: Not on file   Number of children: Not on file   Years of education: Not on file   Highest education level: Not on file  Occupational History   Not on file  Tobacco Use   Smoking status: Some Days    Packs/day: 0.25    Years: 15.00    Total pack years: 3.75    Types: Cigarettes   Smokeless tobacco: Former    Types: Chew    Quit date: 1975   Tobacco comments:    smoked about 1/2 PPD for about 10 years; now at 4-5 cigarettes per day   Vaping Use   Vaping Use: Never used  Substance and Sexual Activity   Alcohol use: Yes    Comment: occassionally   Drug use: Never   Sexual activity: Yes  Other Topics Concern   Not on file  Social History Narrative   Not on file   Social Determinants of Health   Financial Resource Strain: Low Risk  (08/10/2021)   Overall Financial Resource Strain (CARDIA)    Difficulty of Paying Living Expenses: Not hard at all  Food Insecurity: No Food Insecurity (08/10/2021)   Hunger Vital Sign    Worried About Running Out of Food in the Last Year: Never true    Ran Out of Food in the Last Year: Never true  Transportation Needs: No Transportation Needs (08/10/2021)   PRAPARE - Hydrologist (Medical): No    Lack of Transportation (Non-Medical): No  Physical Activity: Inactive (08/10/2021)   Exercise Vital  Sign    Days of Exercise per Week: 0 days    Minutes of Exercise per Session: 0 min  Stress: No Stress Concern Present (08/10/2021)   [place]    Feeling of Stress : Not at all  Social Connections: Not on file    Tobacco Counseling Ready to quit: Not Answered Counseling given: Not Answered Tobacco comments: smoked about 1/2 PPD for about 10 years; now at 4-5 cigarettes per day    Clinical Intake:  Pre-visit preparation completed: Yes  Pain : No/denies pain     Nutritional Status: BMI 25 -29 Overweight Nutritional Risks: None Diabetes: Yes  How often do you need to have someone help you when you read instructions, pamphlets, or other written materials from your doctor or pharmacy?: 1 - Never What is the last grade level you completed in school?: college  Diabetic? Yes Nutrition Risk Assessment:  Has the patient had any N/V/D within the last 2 months?  No  Does the patient have any non-healing wounds?  No  Has the patient had any unintentional weight loss or weight gain?  No   Diabetes:  Is the patient diabetic?  Yes  If diabetic, was a CBG obtained today?  No  Did the patient bring in their glucometer from home?  No  How often do you monitor your CBG's? Twice monthly.   Financial Strains and Diabetes Management:  Are you having any financial strains with the device, your supplies or your medication? No .  Does the patient want to be seen by Chronic Care Management for management of their diabetes?  No  Would the patient like to be referred to a Nutritionist or for Diabetic Management?  No   Diabetic Exams:  Diabetic Eye Exam: Overdue for diabetic eye exam. Pt has been advised about the importance in completing this exam. Patient advised to call and schedule an eye exam. Diabetic Foot Exam: Completed 12/15/2020   Interpreter Needed?: No  Information entered by :: NAllen LPN   Activities of  Daily Living    08/10/2021   12:24 PM  In your present state of health, do you have any difficulty performing the following activities:  Hearing? 0  Vision? 0  Difficulty concentrating or making decisions? 0  Walking or climbing stairs? 0  Dressing or bathing? 0  Doing errands, shopping? 0  Preparing Food and eating ? N  Using the Toilet? N  In the past six months, have you accidently leaked urine? N  Do you have problems with loss of bowel control? N  Managing your Medications? N  Managing your Finances? N  Housekeeping or managing your Housekeeping? N    Patient Care Team: [name], [name] as PCP - General (General Practice)  Indicate any recent Medical Services you may have received from other than [facility] providers in the past year (date may be approximate).     Assessment:   This is a routine wellness examination for [place].  Hearing/Vision screen Vision Screening - Comments:: No regular eye exams  Dietary issues and exercise activities discussed: Current Exercise Habits: The patient does not participate in regular exercise at present   Goals Addressed             This Visit's Progress    Patient Stated       08/10/2021, no goals       Depression Screen    08/10/2021   12:24 PM 05/27/2020    4:21 PM 03/20/2019    3:30 PM 12/03/2018    3:36 PM 05/16/2018    3:23 PM 11/07/2017    3:11 PM 12/12/2013    2:17 PM  PHQ 2/9 Scores  PHQ - 2 Score 0 0 0 0 0 0 0  PHQ- 9 Score   0 0       Fall Risk    08/10/2021   12:24 PM 05/27/2020    4:21 PM 03/20/2019    3:29 PM 12/03/2018    3:36 PM 05/16/2018    3:23 PM  Fall Risk   Falls in the past year? 0 0 1 0 0  Comment   tripped over computer cord    Number falls in past yr: 0  0    Injury with Fall? 0  0    Risk for fall due to : Medication side effect Medication side effect Medication side effect Medication side effect   Follow up Falls evaluation completed;Education provided;Falls prevention discussed Falls evaluation  completed;Education provided;Falls prevention discussed Falls evaluation completed;Education provided;Falls prevention discussed Falls evaluation completed;Education provided;Falls prevention discussed     FALL RISK PREVENTION PERTAINING TO THE HOME:  Any stairs in or around the home? Yes  If so, are there any without handrails? No  Home free of loose throw rugs in walkways, pet beds, electrical cords, etc? Yes  Adequate lighting in your home to reduce risk of falls? Yes   ASSISTIVE DEVICES UTILIZED TO PREVENT FALLS:  Life alert? No  Use of a cane, walker or w/c? No  Grab bars in the bathroom? Yes  Shower chair or bench in shower? No  Elevated toilet seat or a handicapped toilet? Yes   TIMED UP AND GO:  Was the test performed? No .      Cognitive Function:        08/10/2021   12:26 PM 05/27/2020    4:22 PM 03/20/2019    3:33 PM 12/03/2018    3:37 PM 11/07/2017    3:13 PM  6CIT Screen  What Year? 0 points 0 points 0 points 0 points 0 points  What month? 0 points 0 points 0 points 0 points 0 points  What time? 0 points 0 points 0 points 0 points 0 points  Count back from 20 0 points 0 points 0 points 0 points 0 points  Months in reverse 0 points 0 points 0 points 0 points 0 points  Repeat phrase 0 points 0 points 0 points 0 points 0 points  Total Score 0 points 0 points 0 points 0 points 0 points    Immunizations Immunization History  Administered Date(s) Administered   Fluad Quad(high Dose 65+) 11/25/2019, 12/15/2020   Influenza, High Dose Seasonal PF 12/03/2018   PFIZER(Purple Top)SARS-COV-2 Vaccination 04/11/2019, 05/09/2019, 01/01/2020    TDAP status: Up to date  Flu Vaccine status: Up to date  Pneumococcal vaccine status: Declined,  Education has been provided regarding the importance of this vaccine but patient still declined. Advised may receive this vaccine at local pharmacy or Health Dept. Aware to provide a copy of the vaccination record if obtained from  local pharmacy or Health Dept. Verbalized acceptance and understanding.   Covid-19 vaccine status: Completed vaccines  Qualifies for Shingles Vaccine? Yes   Zostavax completed No   Shingrix Completed?: No.    Education has been provided regarding the importance of this vaccine. Patient has been advised to call insurance company to determine out of pocket expense if they have not yet received this vaccine. Advised may also receive vaccine at local pharmacy or Health Dept. Verbalized acceptance and understanding.  Screening Tests Health Maintenance  Topic Date Due   Zoster Vaccines- Shingrix (1 of 2) Never done   OPHTHALMOLOGY EXAM  12/13/2019   COVID-19 Vaccine (4 - Booster for Pfizer series) 02/26/2020   INFLUENZA VACCINE  08/09/2021   Pneumonia Vaccine 64+ Years old (1 - PCV) 03/30/2022 (Originally 03/09/1956)   HEMOGLOBIN A1C  09/29/2021   FOOT EXAM  12/15/2021   COLONOSCOPY (Pts 45-37yr Insurance coverage will need to be confirmed)  05/23/2022   TETANUS/TDAP  04/26/2023   Hepatitis C Screening  Completed   HPV VACCINES  Aged Out    Health Maintenance  Health Maintenance Due  Topic Date Due   Zoster Vaccines- Shingrix (1 of 2) Never done   OPHTHALMOLOGY EXAM  12/13/2019   COVID-19 Vaccine (4 - Booster for Pfizer series) 02/26/2020   INFLUENZA VACCINE  08/09/2021    Colorectal cancer screening: Type of screening: Colonoscopy. Completed 05/23/2019. Repeat every 3 years  Lung Cancer Screening: (Low Dose CT Chest recommended if Age 28-80 years, 30 pack-year currently smoking OR have quit w/in 15years.) does not qualify.   Lung Cancer Screening Referral: no  Additional Screening:  Hepatitis C Screening: does qualify; Completed 12/03/2018  Vision Screening: Recommended annual ophthalmology exams for early detection of glaucoma and other disorders of the eye. Is the patient up to date with their annual eye exam?  No  Who is the provider or what is the name of the office in  which the patient attends annual eye exams? none If pt is not established with a provider, would they like to be referred to a provider to establish care? No .   Dental Screening: Recommended annual dental exams for proper oral hygiene  Community Resource Referral / Chronic Care Management: CRR required this visit?  No   CCM required this visit?  No      Plan:     I have personally reviewed and noted the following in the patient's chart:   Medical and social history Use of alcohol, tobacco or illicit drugs  Current medications and supplements including opioid prescriptions. Patient is not currently taking opioid prescriptions. Functional ability and status Nutritional status Physical activity Advanced directives List of other physicians Hospitalizations, surgeries, and ER visits in previous 12 months Vitals Screenings to include cognitive, depression, and falls Referrals and appointments  In addition, I have reviewed and discussed with patient certain preventive protocols, quality metrics, and best practice recommendations. A written personalized care plan for preventive services as well as general preventive health recommendations were provided to patient.     Kellie Simmering, LPN   08/12/4619   Nurse Notes: none  Due to this being a virtual visit, the after visit summary with patients personalized plan was offered to patient via mail or my-chart.  to pick up at office at next visit

## 2021-08-11 ENCOUNTER — Other Ambulatory Visit: Payer: Self-pay | Admitting: Nurse Practitioner

## 2021-08-11 DIAGNOSIS — C61 Malignant neoplasm of prostate: Secondary | ICD-10-CM

## 2021-08-16 ENCOUNTER — Other Ambulatory Visit (HOSPITAL_COMMUNITY): Payer: Self-pay

## 2021-09-02 ENCOUNTER — Other Ambulatory Visit: Payer: Self-pay

## 2021-09-02 ENCOUNTER — Inpatient Hospital Stay: Payer: Medicare PPO | Attending: Hematology

## 2021-09-02 ENCOUNTER — Inpatient Hospital Stay: Payer: Medicare PPO | Admitting: Oncology

## 2021-09-02 ENCOUNTER — Inpatient Hospital Stay: Payer: Medicare PPO

## 2021-09-02 VITALS — BP 140/85 | HR 64 | Temp 98.2°F | Resp 17 | Ht 69.0 in | Wt 197.2 lb

## 2021-09-02 DIAGNOSIS — C61 Malignant neoplasm of prostate: Secondary | ICD-10-CM

## 2021-09-02 DIAGNOSIS — I10 Essential (primary) hypertension: Secondary | ICD-10-CM | POA: Diagnosis not present

## 2021-09-02 DIAGNOSIS — Z5111 Encounter for antineoplastic chemotherapy: Secondary | ICD-10-CM | POA: Diagnosis not present

## 2021-09-02 LAB — CMP (CANCER CENTER ONLY)
ALT: 5 U/L (ref 0–44)
AST: 10 U/L — ABNORMAL LOW (ref 15–41)
Albumin: 3.9 g/dL (ref 3.5–5.0)
Alkaline Phosphatase: 67 U/L (ref 38–126)
Anion gap: 5 (ref 5–15)
BUN: 25 mg/dL — ABNORMAL HIGH (ref 8–23)
CO2: 29 mmol/L (ref 22–32)
Calcium: 9.4 mg/dL (ref 8.9–10.3)
Chloride: 108 mmol/L (ref 98–111)
Creatinine: 0.72 mg/dL (ref 0.61–1.24)
GFR, Estimated: >60 mL/min (ref >60)
Glucose, Bld: 74 mg/dL (ref 70–99)
Potassium: 3.8 mmol/L (ref 3.5–5.1)
Sodium: 142 mmol/L (ref 135–145)
Total Bilirubin: 0.4 mg/dL (ref 0.3–1.2)
Total Protein: 7 g/dL (ref 6.5–8.1)

## 2021-09-02 LAB — CBC WITH DIFFERENTIAL (CANCER CENTER ONLY)
Abs Immature Granulocytes: 0.01 10*3/uL (ref 0.00–0.07)
Basophils Absolute: 0 10*3/uL (ref 0.0–0.1)
Basophils Relative: 1 %
Eosinophils Absolute: 0.4 10*3/uL (ref 0.0–0.5)
Eosinophils Relative: 6 %
HCT: 38.9 % — ABNORMAL LOW (ref 39.0–52.0)
Hemoglobin: 13.2 g/dL (ref 13.0–17.0)
Immature Granulocytes: 0 %
Lymphocytes Relative: 26 %
Lymphs Abs: 1.7 10*3/uL (ref 0.7–4.0)
MCH: 34.2 pg — ABNORMAL HIGH (ref 26.0–34.0)
MCHC: 33.9 g/dL (ref 30.0–36.0)
MCV: 100.8 fL — ABNORMAL HIGH (ref 80.0–100.0)
Monocytes Absolute: 0.4 10*3/uL (ref 0.1–1.0)
Monocytes Relative: 6 %
Neutro Abs: 4 10*3/uL (ref 1.7–7.7)
Neutrophils Relative %: 61 %
Platelet Count: 167 10*3/uL (ref 150–400)
RBC: 3.86 MIL/uL — ABNORMAL LOW (ref 4.22–5.81)
RDW: 14.7 % (ref 11.5–15.5)
WBC Count: 6.4 10*3/uL (ref 4.0–10.5)
nRBC: 0 % (ref 0.0–0.2)

## 2021-09-02 MED ORDER — LEUPROLIDE ACETATE (4 MONTH) 30 MG ~~LOC~~ KIT
30.0000 mg | PACK | Freq: Once | SUBCUTANEOUS | Status: AC
  Administered 2021-09-02: 30 mg via SUBCUTANEOUS
  Filled 2021-09-02: qty 30

## 2021-09-02 NOTE — Progress Notes (Signed)
Hematology and Oncology Follow Up Visit  Kyle George [id number] 1950/12/11 71 y.o. 09/02/2021 1:14 PM Kyle George, FNPMoore, [place], [name]   Principle Diagnosis: 71 year old man with advanced prostate cancer with lymphadenopathy diagnosed in team.  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   Prior Therapy: Status post androgen deprivation and initially with [place] subsequently with Lupron with a PSA dropping down to 500 and December 2015.  He failed to follow-up with the PSA rising up to 1400 and April 2018.   Zytiga 1000 mg daily with prednisone at 5 mg daily started in August 2018.   Current therapy:   Eligard 30 mg every 4 months.  He will receive Eligard today and repeated in 4 months.  Xtandi 160 mg daily started on March 01, 2020.  Interim History:  Kyle George returns today for a follow-up.  Since last visit, he reports feeling well without any major complaints.  He denies any nausea, vomiting or abdominal pain.  He denies any bone pain or pathological fractures he denies any hospitalizations or illnesses.  Continues to tolerate Xtandi without any issues or complaints.  He denies excessive fatigue or weight loss.     Medications: Reviewed without changes. Current Outpatient Medications  Medication Sig Dispense Refill   aspirin 81 MG tablet Take 81 mg by mouth daily.     B-D ULTRAFINE III SHORT PEN 31G X 8 MM MISC USE AS DIRECTED WITH LEVEMIR PEN 100 each 5   blood glucose meter kit and supplies KIT Dispense based on patient and insurance preference. Use up to four times daily as directed. 1 each 0   Blood Pressure Monitoring (BLOOD PRESSURE CUFF) MISC by Does not apply route. Check blood pressure at least 4 times per week 3 hours after taking medication     Calcium Carbonate-Vitamin D (CALCIUM 600+D PO) Take 1 tablet by mouth daily.     carvedilol (COREG) 6.25 MG tablet TAKE 1 TABLET BY MOUTH TWICE A DAY WITH FOOD 60 tablet 0    cholecalciferol (VITAMIN D) 1000 UNITS tablet Take 5,000 Units by mouth daily.     enzalutamide (XTANDI) 40 MG tablet TAKE 4 TABLETS (160 MG TOTAL) BY MOUTH DAILY. 120 tablet 0   glucose blood test strip 1 each by Other route as needed for other. Use as instructed to check blood sugars     Insulin Syringe-Needle U-100 31G X 5/16" 0.3 ML MISC by Does not apply route. Use as directed with levemir pen     JANUVIA 100 MG tablet TAKE 1 TABLET BY MOUTH EVERY DAY 90 tablet 0   Lancets (ONETOUCH DELICA PLUS [id number]) MISC by Does not apply route. Use as directed to check blood  Sugars 2 times per day dx E11.65     LEVEMIR FLEXTOUCH 100 UNIT/ML FlexTouch Pen INJECT 15 UNITS INTO THE SKIN DAILY. 15 mL 1   Multiple Vitamin (MULTIVITAMIN WITH MINERALS) TABS Take 1 tablet by mouth daily.     NON FORMULARY Take 1 tablet by mouth daily. Thyroid  action     Olmesartan-amLODIPine-HCTZ 40-10-25 MG TABS TAKE 1 TABLET BY MOUTH EVERY DAY 90 tablet 1   Omega-3 Fatty Acids (FISH OIL PO) Take 1 tablet by mouth once.     pioglitazone-metformin (ACTOPLUS MET) 15-850 MG tablet TAKE 1 TABLET BY MOUTH TWICE A DAY 180 tablet 1   predniSONE (DELTASONE) 5 MG tablet TAKE 1 TABLET BY MOUTH EVERY DAY WITH BREAKFAST (Patient not taking: Reported on 12/15/2020)  30 tablet 1   rosuvastatin (CRESTOR) 10 MG tablet TAKE 1 TABLET BY MOUTH EVERY DAY 90 tablet 1   spironolactone (ALDACTONE) 25 MG tablet TAKE 1 TABLET BY MOUTH EVERY DAY 90 tablet 1   No current facility-administered medications for this visit.     Allergies:  No Known Allergies     Physical Exam:    Blood pressure (!) 140/85, pulse 64, temperature 98.2 F (36.8 C), temperature source Temporal, resp. rate 17, height '5\' 9"'  (1.753 m), weight 197 lb 3.2 oz (89.4 kg), SpO2 98 %.         ECOG: 0    General appearance: Comfortable appearing without any discomfort Head: Normocephalic without any trauma Oropharynx: Mucous membranes are moist and pink without  any thrush or ulcers. Eyes: Pupils are equal and round reactive to light. Lymph nodes: No cervical, supraclavicular, inguinal or axillary lymphadenopathy.   Heart:regular rate and rhythm.  S1 and S2 without leg edema. Lung: Clear without any rhonchi or wheezes.  No dullness to percussion. Abdomin: Soft, nontender, nondistended with good bowel sounds.  No hepatosplenomegaly. Musculoskeletal: No joint deformity or effusion.  Full range of motion noted. Neurological: No deficits noted on motor, sensory and deep tendon reflex exam. Skin: No petechial rash or dryness.  Appeared moist.                Lab Results: Lab Results  Component Value Date   WBC 10.4 04/29/2021   HGB 13.9 04/29/2021   HCT 42.4 04/29/2021   MCV 102.2 (H) 04/29/2021   PLT 163 04/29/2021   PSA 78.69 (H) 03/13/2014                 Latest Reference Range & Units 11/12/20 09:38 04/29/21 14:17  Prostate Specific Ag, Serum 0.0 - 4.0 ng/mL <0.1 <0.58        71 year old man with:  1.  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    His disease status was updated at this time and treatment choices were reviewed.  His PSA continues to be undetectable with excellent response to [place].  Risks and benefits of continuing this treatment were discussed.  Complications that include nausea, fatigue and hypertension were reiterated.  He is agreeable to continue.  2. Androgen depravation: He is currently on Eligard and will continue every 4 months.  Complications: Weight gain and hot flashes among others were reiterated.   3. Bone health: He is to continue calcium and vitamin D supplements at this time.  Delton See will be deferred to obtain dental clearance or he develops disease.  5.  Hypertension: His blood pressure is close to normal range she will continue to monitor at future visits on Xtandi.  6.  Prognosis and goals of care: His disease is incurable although  aggressive measures are warranted given his excellent form status.  7. Follow-up: He will return in 4 months for a follow-up.   30 minutes were dedicated to this encounter.  The time spent on reviewing laboratory data, disease status update and outlining future plan of care discussion.  Zola Button, MD 8/25/20231:14 PM

## 2021-09-03 LAB — PROSTATE-SPECIFIC AG, SERUM (LABCORP): Prostate Specific Ag, Serum: <0.1 ng/mL (ref 0.0–4.0)

## 2021-09-06 ENCOUNTER — Other Ambulatory Visit: Payer: Self-pay | Admitting: Nurse Practitioner

## 2021-09-06 DIAGNOSIS — C61 Malignant neoplasm of prostate: Secondary | ICD-10-CM

## 2021-09-08 ENCOUNTER — Encounter: Payer: Self-pay | Admitting: Nurse Practitioner

## 2021-09-08 ENCOUNTER — Ambulatory Visit: Payer: Medicare PPO | Admitting: Nurse Practitioner

## 2021-09-08 ENCOUNTER — Other Ambulatory Visit: Payer: Self-pay | Admitting: Oncology

## 2021-09-08 ENCOUNTER — Other Ambulatory Visit (HOSPITAL_COMMUNITY): Payer: Self-pay

## 2021-09-08 VITALS — BP 132/74 | HR 67 | Temp 97.7°F | Ht 69.0 in | Wt 196.0 lb

## 2021-09-08 DIAGNOSIS — J438 Other emphysema: Secondary | ICD-10-CM | POA: Diagnosis not present

## 2021-09-08 DIAGNOSIS — C61 Malignant neoplasm of prostate: Secondary | ICD-10-CM

## 2021-09-08 DIAGNOSIS — Z794 Long term (current) use of insulin: Secondary | ICD-10-CM

## 2021-09-08 DIAGNOSIS — I10 Essential (primary) hypertension: Secondary | ICD-10-CM | POA: Diagnosis not present

## 2021-09-08 DIAGNOSIS — C7951 Secondary malignant neoplasm of bone: Secondary | ICD-10-CM

## 2021-09-08 DIAGNOSIS — I700 Atherosclerosis of aorta: Secondary | ICD-10-CM | POA: Diagnosis not present

## 2021-09-08 DIAGNOSIS — E782 Mixed hyperlipidemia: Secondary | ICD-10-CM

## 2021-09-08 DIAGNOSIS — E11628 Type 2 diabetes mellitus with other skin complications: Secondary | ICD-10-CM | POA: Diagnosis not present

## 2021-09-08 DIAGNOSIS — Z720 Tobacco use: Secondary | ICD-10-CM | POA: Diagnosis not present

## 2021-09-08 LAB — HEMOGLOBIN A1C
Est. average glucose Bld gHb Est-mCnc: 117 mg/dL
Hgb A1c MFr Bld: 5.7 % — ABNORMAL HIGH (ref 4.8–5.6)

## 2021-09-08 MED ORDER — ENZALUTAMIDE 40 MG PO TABS
ORAL_TABLET | ORAL | 0 refills | Status: DC
  Filled 2021-09-08: qty 120, 30d supply, fill #0

## 2021-09-08 NOTE — Progress Notes (Signed)
I,Tianna Badgett,acting as a Education administrator for [employer], [name].,have documented all relevant documentation on the behalf of [name], [name],as directed by  [name], [name] while in the presence of [name], [place].  Subjective:     Patient ID: Kyle George , male    DOB: 29-Jan-1950 , 71 y.o.   MRN: [id number]   Chief Complaint  Patient presents with   Hypertension    HPI  Patient here for a blood pressure and diabetes check. Pt is willing to complete foot exam today. He is no longer working at [employer] - they did not renew his contract.   Wt Readings from Last 3 Encounters: 09/08/21 : 196 lb (88.9 kg) 09/02/21 : 197 lb 3.2 oz (89.4 kg) 08/10/21 : 200 lb (90.7 kg)    Hypertension This is a chronic problem. The current episode started more than 1 year ago. The problem is unchanged. The problem is controlled. Pertinent negatives include no chest pain, headaches, malaise/fatigue, palpitations or shortness of breath. There are no associated agents to hypertension. There are no known risk factors for coronary artery disease. Past treatments include ACE inhibitors. There are no compliance problems.  There is no history of kidney disease. There is no history of chronic renal disease.  Diabetes He presents for his follow-up diabetic visit. He has type 2 diabetes mellitus. There are no hypoglycemic associated symptoms. Pertinent negatives for hypoglycemia include no dizziness or headaches. There are no diabetic associated symptoms. Pertinent negatives for diabetes include no chest pain, no fatigue, no polydipsia, no polyphagia and no polyuria. There are no hypoglycemic complications. There are no diabetic complications. Risk factors for coronary artery disease include male sex and sedentary lifestyle. Current diabetic treatment includes oral agent (dual therapy). He is compliant with treatment some of the time. He is following a generally healthy diet. When asked about meal  planning, he reported none. He has not had a previous visit with a dietitian. Exercise: he works part time as a Retail buyer. (Does not check his blood sugar at home. He does not have a machine) He does not see a podiatrist.Eye exam is not current.     Past Medical History:  Diagnosis Date   Cancer of prostate ([place])    Diabetes mellitus without complication ([place])    Hyperlipidemia    Hypertension      Family History  Problem Relation Age of Onset   Hypertension Mother    Diabetes Mother    Cancer Father    Colon cancer Neg Hx    Esophageal cancer Neg Hx    Stomach cancer Neg Hx    Rectal cancer Neg Hx      Current Outpatient Medications:    aspirin 81 MG tablet, Take 81 mg by mouth daily., Disp: , Rfl:    B-D ULTRAFINE III SHORT PEN 31G X 8 MM MISC, USE AS DIRECTED WITH LEVEMIR PEN, Disp: 100 each, Rfl: 5   blood glucose meter kit and supplies KIT, Dispense based on patient and insurance preference. Use up to four times daily as directed., Disp: 1 each, Rfl: 0   Blood Pressure Monitoring (BLOOD PRESSURE CUFF) MISC, by Does not apply route. Check blood pressure at least 4 times per week 3 hours after taking medication, Disp: , Rfl:    Calcium Carbonate-Vitamin D (CALCIUM 600+D PO), Take 1 tablet by mouth daily., Disp: , Rfl:    carvedilol (COREG) 6.25 MG tablet, TAKE 1 TABLET BY MOUTH TWICE A DAY WITH FOOD, Disp:  60 tablet, Rfl: 0   cholecalciferol (VITAMIN D) 1000 UNITS tablet, Take 5,000 Units by mouth daily., Disp: , Rfl:    enzalutamide (XTANDI) 40 MG tablet, TAKE 4 TABLETS (160 MG TOTAL) BY MOUTH DAILY., Disp: 120 tablet, Rfl: 0   glucose blood test strip, 1 each by Other route as needed for other. Use as instructed to check blood sugars, Disp: , Rfl:    Insulin Syringe-Needle U-100 31G X 5/16" 0.3 ML MISC, by Does not apply route. Use as directed with levemir pen, Disp: , Rfl:    JANUVIA 100 MG tablet, TAKE 1 TABLET BY MOUTH EVERY DAY, Disp: 90 tablet, Rfl: 0   Lancets (ONETOUCH  DELICA PLUS MNOTRR11A) MISC, by Does not apply route. Use as directed to check blood  Sugars 2 times per day dx E11.65, Disp: , Rfl:    LEVEMIR FLEXTOUCH 100 UNIT/ML FlexTouch Pen, INJECT 15 UNITS INTO THE SKIN DAILY., Disp: 15 mL, Rfl: 1   Multiple Vitamin (MULTIVITAMIN WITH MINERALS) TABS, Take 1 tablet by mouth daily., Disp: , Rfl:    NON FORMULARY, Take 1 tablet by mouth daily. Thyroid  action, Disp: , Rfl:    Olmesartan-amLODIPine-HCTZ 40-10-25 MG TABS, TAKE 1 TABLET BY MOUTH EVERY DAY, Disp: 90 tablet, Rfl: 1   Omega-3 Fatty Acids (FISH OIL PO), Take 1 tablet by mouth once., Disp: , Rfl:    pioglitazone-metformin (ACTOPLUS MET) 15-850 MG tablet, TAKE 1 TABLET BY MOUTH TWICE A DAY, Disp: 180 tablet, Rfl: 1   predniSONE (DELTASONE) 5 MG tablet, TAKE 1 TABLET BY MOUTH EVERY DAY WITH BREAKFAST (Patient not taking: Reported on 12/15/2020), Disp: 30 tablet, Rfl: 1   rosuvastatin (CRESTOR) 10 MG tablet, TAKE 1 TABLET BY MOUTH EVERY DAY, Disp: 90 tablet, Rfl: 1   spironolactone (ALDACTONE) 25 MG tablet, TAKE 1 TABLET BY MOUTH EVERY DAY, Disp: 90 tablet, Rfl: 1   No Known Allergies   Review of Systems  Constitutional: Negative.  Negative for fatigue and malaise/fatigue.  Respiratory: Negative.  Negative for shortness of breath.   Cardiovascular: Negative.  Negative for chest pain and palpitations.  Gastrointestinal: Negative.   Endocrine: Negative for polydipsia, polyphagia and polyuria.  Neurological: Negative.  Negative for dizziness and headaches.     Today's Vitals   09/08/21 0957  BP: 132/74  Pulse: 67  Temp: 97.7 F (36.5 C)  TempSrc: Oral  Weight: 196 lb (88.9 kg)  Height: _0  (1.753 m)   Body mass index is 28.94 kg/m.   Objective:  Physical Exam Vitals reviewed.  Constitutional:      General: He is not in acute distress.    Appearance: Normal appearance.  Cardiovascular:     Rate and Rhythm: Normal rate and regular rhythm.     Pulses: Normal pulses.     Heart sounds:  Normal heart sounds. No murmur heard. Pulmonary:     Effort: Pulmonary effort is normal. No respiratory distress.     Breath sounds: Normal breath sounds. No wheezing.  Skin:    General: Skin is warm and dry.     Capillary Refill: Capillary refill takes less than 2 seconds.  Neurological:     General: No focal deficit present.     Mental Status: He is alert and oriented to person, place, and time.     Cranial Nerves: No cranial nerve deficit.     Motor: No weakness.  Psychiatric:        Mood and Affect: Mood normal.  Behavior: Behavior normal.        Thought Content: Thought content normal.        Judgment: Judgment normal.         Assessment And Plan:     1. Essential hypertension Comments: [place] pressure is controlled, continue current medications  2. Type 2 diabetes mellitus with other skin complication, with long-term current use of insulin (HCC) Comments: HgbA1c stable, continue current medications. Will refer to Podiatry. Diabetes foot exam done with dry skin. Educated on diabetes foot care - Hemoglobin A1c - Ambulatory referral to Podiatry  3. Mixed hyperlipidemia Comments: Cholesterol levels are stable, continue statin, tolerating well.  4. Aortic atherosclerosis (HCC) Comments: Seen on CT scan, will start on statin  5. Other emphysema ([place]) Comments: No current coughing or shortness of breath, at this time not interested in quitting smoking. This was found on CT scan - CT CHEST LUNG CA SCREEN LOW DOSE W/O CM; Future  6. 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 Patient verbalized understanding of  the plan and was able to repeat key elements of the plan. All questions were answered to their satisfaction.  [name], [name]   I, [name], [name], have reviewed all documentation for this visit. The documentation on 09/08/21 for the exam, diagnosis, procedures, and orders are all accurate and complete.   IF YOU HAVE BEEN REFERRED TO A SPECIALIST, IT MAY TAKE 1-2 WEEKS TO SCHEDULE/PROCESS THE REFERRAL. IF YOU HAVE NOT HEARD FROM US/SPECIALIST IN TWO WEEKS, PLEASE GIVE [place] A CALL AT [phone].   THE PATIENT IS ENCOURAGED TO PRACTICE SOCIAL DISTANCING DUE TO THE COVID-19 PANDEMIC.

## 2021-09-08 NOTE — Patient Instructions (Signed)
Hypertension, Adult High blood pressure (hypertension) is when the force of blood pumping through the arteries is too strong. The arteries are the blood vessels that carry blood from the heart throughout the body. Hypertension forces the heart to work harder to pump blood and may cause arteries to become narrow or stiff. Untreated or uncontrolled hypertension can lead to a heart attack, heart failure, a stroke, kidney disease, and other problems. A blood pressure reading consists of a higher number over a lower number. Ideally, your blood pressure should be below 120/80. The first ("top") number is called the systolic pressure. It is a measure of the pressure in your arteries as your heart beats. The second ("bottom") number is called the diastolic pressure. It is a measure of the pressure in your arteries as the heart relaxes. What are the causes? The exact cause of this condition is not known. There are some conditions that result in high blood pressure. What increases the risk? Certain factors may make you more likely to develop high blood pressure. Some of these risk factors are under your control, including: Smoking. Not getting enough exercise or physical activity. Being overweight. Having too much fat, sugar, calories, or salt (sodium) in your diet. Drinking too much alcohol. Other risk factors include: Having a personal history of heart disease, diabetes, high cholesterol, or kidney disease. Stress. Having a family history of high blood pressure and high cholesterol. Having obstructive sleep apnea. Age. The risk increases with age. What are the signs or symptoms? High blood pressure may not cause symptoms. Very high blood pressure (hypertensive crisis) may cause: Headache. Fast or irregular heartbeats (palpitations). Shortness of breath. Nosebleed. Nausea and vomiting. Vision changes. Severe chest pain, dizziness, and seizures. How is this diagnosed? This condition is diagnosed by  measuring your blood pressure while you are seated, with your arm resting on a flat surface, your legs uncrossed, and your feet flat on the floor. The cuff of the blood pressure monitor will be placed directly against the skin of your upper arm at the level of your heart. Blood pressure should be measured at least twice using the same arm. Certain conditions can cause a difference in blood pressure between your right and left arms. If you have a high blood pressure reading during one visit or you have normal blood pressure with other risk factors, you may be asked to: Return on a different day to have your blood pressure checked again. Monitor your blood pressure at home for 1 week or longer. If you are diagnosed with hypertension, you may have other blood or imaging tests to help your health care provider understand your overall risk for other conditions. How is this treated? This condition is treated by making healthy lifestyle changes, such as eating healthy foods, exercising more, and reducing your alcohol intake. You may be referred for counseling on a healthy diet and physical activity. Your health care provider may prescribe medicine if lifestyle changes are not enough to get your blood pressure under control and if: Your systolic blood pressure is above 130. Your diastolic blood pressure is above 80. Your personal target blood pressure may vary depending on your medical conditions, your age, and other factors. Follow these instructions at home: Eating and drinking  Eat a diet that is high in fiber and potassium, and low in sodium, added sugar, and fat. An example of this eating plan is called the DASH diet. DASH stands for Dietary Approaches to Stop Hypertension. To eat this way: Eat   plenty of fresh fruits and vegetables. Try to fill one half of your plate at each meal with fruits and vegetables. Eat whole grains, such as whole-wheat pasta, brown rice, or whole-grain bread. Fill about one  fourth of your plate with whole grains. Eat or drink low-fat dairy products, such as skim milk or low-fat yogurt. Avoid fatty cuts of meat, processed or cured meats, and poultry with skin. Fill about one fourth of your plate with lean proteins, such as fish, chicken without skin, beans, eggs, or tofu. Avoid pre-made and processed foods. These tend to be higher in sodium, added sugar, and fat. Reduce your daily sodium intake. Many people with hypertension should eat less than 1,500 mg of sodium a day. Do not drink alcohol if: Your health care provider tells you not to drink. You are pregnant, may be pregnant, or are planning to become pregnant. If you drink alcohol: Limit how much you have to: 0-1 drink a day for women. 0-2 drinks a day for men. Know how much alcohol is in your drink. In the [place] one drink equals one 12 oz bottle of beer (355 mL), one 5 oz glass of wine (148 mL), or one 1 oz glass of hard liquor (44 mL). Lifestyle  Work with your health care provider to maintain a healthy body weight or to lose weight. Ask what an ideal weight is for you. Get at least 30 minutes of exercise that causes your heart to beat faster (aerobic exercise) most days of the week. Activities may include walking, swimming, or biking. Include exercise to strengthen your muscles (resistance exercise), such as Pilates or lifting weights, as part of your weekly exercise routine. Try to do these types of exercises for 30 minutes at least 3 days a week. Do not use any products that contain nicotine or tobacco. These products include cigarettes, chewing tobacco, and vaping devices, such as e-cigarettes. If you need help quitting, ask your health care provider. Monitor your blood pressure at home as told by your health care provider. Keep all follow-up visits. This is important. Medicines Take over-the-counter and prescription medicines only as told by your health care provider. Follow directions carefully. Blood  pressure medicines must be taken as prescribed. Do not skip doses of blood pressure medicine. Doing this puts you at risk for problems and can make the medicine less effective. Ask your health care provider about side effects or reactions to medicines that you should watch for. Contact a health care provider if you: Think you are having a reaction to a medicine you are taking. Have headaches that keep coming back (recurring). Feel dizzy. Have swelling in your ankles. Have trouble with your vision. Get help right away if you: Develop a severe headache or confusion. Have unusual weakness or numbness. Feel faint. Have severe pain in your chest or abdomen. Vomit repeatedly. Have trouble breathing. These symptoms may be an emergency. Get help right away. Call 911. Do not wait to see if the symptoms will go away. Do not drive yourself to the hospital. Summary Hypertension is when the force of blood pumping through your arteries is too strong. If this condition is not controlled, it may put you at risk for serious complications. Your personal target blood pressure may vary depending on your medical conditions, your age, and other factors. For most people, a normal blood pressure is less than 120/80. Hypertension is treated with lifestyle changes, medicines, or a combination of both. Lifestyle changes include losing weight, eating a healthy,   low-sodium diet, exercising more, and limiting alcohol. This information is not intended to replace advice given to you by your health care provider. Make sure you discuss any questions you have with your health care provider. Document Revised: 11/02/2020 Document Reviewed: 11/02/2020 Elsevier Patient Education  2023 Elsevier Inc.  

## 2021-09-14 ENCOUNTER — Other Ambulatory Visit (HOSPITAL_COMMUNITY): Payer: Self-pay

## 2021-09-20 ENCOUNTER — Other Ambulatory Visit (INDEPENDENT_AMBULATORY_CARE_PROVIDER_SITE_OTHER): Payer: Self-pay | Admitting: Nurse Practitioner

## 2021-09-20 DIAGNOSIS — M7989 Other specified soft tissue disorders: Secondary | ICD-10-CM

## 2021-09-21 ENCOUNTER — Encounter (INDEPENDENT_AMBULATORY_CARE_PROVIDER_SITE_OTHER): Payer: Medicare PPO

## 2021-09-21 ENCOUNTER — Encounter (INDEPENDENT_AMBULATORY_CARE_PROVIDER_SITE_OTHER): Payer: Medicare PPO | Admitting: Nurse Practitioner

## 2021-10-05 ENCOUNTER — Other Ambulatory Visit: Payer: Self-pay | Admitting: Nurse Practitioner

## 2021-10-05 DIAGNOSIS — C61 Malignant neoplasm of prostate: Secondary | ICD-10-CM

## 2021-10-07 ENCOUNTER — Other Ambulatory Visit (HOSPITAL_COMMUNITY): Payer: Self-pay

## 2021-10-10 ENCOUNTER — Other Ambulatory Visit (HOSPITAL_COMMUNITY): Payer: Self-pay

## 2021-10-12 ENCOUNTER — Other Ambulatory Visit: Payer: Self-pay | Admitting: Oncology

## 2021-10-12 ENCOUNTER — Other Ambulatory Visit (HOSPITAL_COMMUNITY): Payer: Self-pay

## 2021-10-12 DIAGNOSIS — C61 Malignant neoplasm of prostate: Secondary | ICD-10-CM

## 2021-10-12 MED ORDER — ENZALUTAMIDE 40 MG PO TABS
ORAL_TABLET | ORAL | 0 refills | Status: DC
  Filled 2021-10-12: qty 120, 30d supply, fill #0

## 2021-10-18 ENCOUNTER — Other Ambulatory Visit (HOSPITAL_COMMUNITY): Payer: Self-pay

## 2021-10-23 ENCOUNTER — Other Ambulatory Visit: Payer: Self-pay | Admitting: Nurse Practitioner

## 2021-10-23 DIAGNOSIS — E119 Type 2 diabetes mellitus without complications: Secondary | ICD-10-CM

## 2021-11-03 ENCOUNTER — Other Ambulatory Visit: Payer: Self-pay | Admitting: Nurse Practitioner

## 2021-11-03 DIAGNOSIS — C61 Malignant neoplasm of prostate: Secondary | ICD-10-CM

## 2021-11-08 ENCOUNTER — Other Ambulatory Visit (HOSPITAL_COMMUNITY): Payer: Self-pay

## 2021-11-08 ENCOUNTER — Telehealth: Payer: Self-pay | Admitting: Pharmacy Technician

## 2021-11-08 NOTE — Telephone Encounter (Signed)
Oral Oncology Patient Advocate Encounter   Received notification that prior authorization for Gillermina Phy is due for renewal.   PA submitted on 11/08/2021 Key [id number] Status is pending     Lady Deutscher, CPhT-Adv Oncology Pharmacy Patient [place] Direct Number: [phone]  Fax: [phone]

## 2021-11-08 NOTE — Telephone Encounter (Signed)
Oral Oncology Patient Advocate Encounter  Prior Authorization for Gillermina Phy has been approved until further notice or plan changes.  Lady Deutscher, CPhT-Adv Oncology Pharmacy Patient [place] Direct Number: [phone]  Fax: [phone]

## 2021-11-10 ENCOUNTER — Encounter: Payer: Self-pay | Admitting: Oncology

## 2021-11-10 ENCOUNTER — Telehealth: Payer: Self-pay | Admitting: Pharmacy Technician

## 2021-11-10 ENCOUNTER — Other Ambulatory Visit (HOSPITAL_COMMUNITY): Payer: Self-pay

## 2021-11-10 NOTE — Telephone Encounter (Signed)
Oral Oncology Patient Advocate Encounter  Was successful in securing patient a $8,000 grant from [employer] to provide copayment coverage for [place].  This will keep the out of pocket expense at $0.     [facility] ID: [id number]  I have spoken with the patient.   The billing information is as follows and has been shared with [facility].    RxBin: [id number] PCN: [id number] Member ID: [id number] Group ID: [id number] Dates of Eligibility: 10/11/21 through 10/11/22  Fund:  [place], CPhT-Adv Oncology Pharmacy Patient [place] Direct Number: [phone]  Fax: [phone]

## 2021-11-11 ENCOUNTER — Other Ambulatory Visit: Payer: Self-pay | Admitting: Oncology

## 2021-11-11 ENCOUNTER — Other Ambulatory Visit (HOSPITAL_COMMUNITY): Payer: Self-pay

## 2021-11-11 DIAGNOSIS — C61 Malignant neoplasm of prostate: Secondary | ICD-10-CM

## 2021-11-11 MED ORDER — ENZALUTAMIDE 40 MG PO TABS
ORAL_TABLET | ORAL | 0 refills | Status: DC
  Filled 2021-11-11: qty 120, 30d supply, fill #0

## 2021-11-14 DIAGNOSIS — H524 Presbyopia: Secondary | ICD-10-CM | POA: Diagnosis not present

## 2021-11-14 DIAGNOSIS — H2513 Age-related nuclear cataract, bilateral: Secondary | ICD-10-CM | POA: Diagnosis not present

## 2021-11-14 DIAGNOSIS — E113293 Type 2 diabetes mellitus with mild nonproliferative diabetic retinopathy without macular edema, bilateral: Secondary | ICD-10-CM | POA: Diagnosis not present

## 2021-11-16 ENCOUNTER — Other Ambulatory Visit (HOSPITAL_COMMUNITY): Payer: Self-pay

## 2021-11-17 ENCOUNTER — Ambulatory Visit: Payer: Self-pay

## 2021-11-17 NOTE — Patient Instructions (Signed)
Visit Information  Thank you for taking time to visit with me today. Please don't hesitate to contact me if I can be of assistance to you.   Following are the goals we discussed today:   Goals Addressed             This Visit's Progress    COMPLETED: Care Coordination Activities       Care Coordination Interventions: SDoH screening completed - no acute resource challenges identified at this time Discussed the patient does experience difficulty affording Xtandi Performed chart review to note patient has been approved for a grant until 10/11/22 which will make this medication a $0 copay Encouraged the patient to contact Dr. Hazeline Junker office for more details Education provided on the role of the care coordination team - no follow up desired at this time Instructed the patient to contact his primary care provider as needed         If you are experiencing a Mental Health or [place] or need someone to talk to, please call 1-800-273-TALK (toll free, 24 hour hotline)  The patient verbalized understanding of instructions, educational materials, and care plan provided today and DECLINED offer to receive copy of patient instructions, educational materials, and care plan.   No further follow up required: Please contact your primary care provider as needed  Daneen Schick, Arita Miss, CDP Social Worker, Certified Dementia Practitioner [place] Coordination [phone]

## 2021-11-17 NOTE — Patient Outreach (Signed)
  Care Coordination   Initial Visit Note   11/17/2021 Name: Kyle George MRN: [id number] DOB: [birth year]  Kyle George is a 71 y.o. year old male who sees [name], [place] for primary care. I spoke with  [name] by phone today.  What matters to the patients health and wellness today?  I have a hard time affording [place]             This Visit's Progress    COMPLETED: Care Coordination Activities       Care Coordination Interventions: SDoH screening completed - no acute resource challenges identified at this time Discussed the patient does experience difficulty affording Xtandi Performed chart review to note patient has been approved for a grant until 10/11/22 which will make this medication a $0 copay Encouraged the patient to contact Dr. Hazeline Junker office for more details Education provided on the role of the care coordination team - no follow up desired at this time Instructed the patient to contact his primary care provider as needed         SDOH assessments and interventions completed:  Yes  SDOH Interventions Today    Flowsheet Row Most Recent Value  SDOH Interventions   Food Insecurity Interventions Intervention Not Indicated  Housing Interventions Intervention Not Indicated  Transportation Interventions Intervention Not Indicated  Utilities Interventions Intervention Not Indicated        Care Coordination Interventions Activated:  Yes  Care Coordination Interventions:  Yes, provided   Follow up plan: No further intervention required.   Encounter Outcome:  Pt. Visit Completed   Daneen Schick, BSW, CDP Social Worker, Certified Dementia Practitioner [place] [facility]  Care Coordination [phone]

## 2021-11-22 DIAGNOSIS — L603 Nail dystrophy: Secondary | ICD-10-CM | POA: Diagnosis not present

## 2021-11-22 DIAGNOSIS — B353 Tinea pedis: Secondary | ICD-10-CM | POA: Diagnosis not present

## 2021-11-22 DIAGNOSIS — E119 Type 2 diabetes mellitus without complications: Secondary | ICD-10-CM | POA: Diagnosis not present

## 2021-11-22 DIAGNOSIS — L608 Other nail disorders: Secondary | ICD-10-CM | POA: Diagnosis not present

## 2021-11-22 DIAGNOSIS — M2011 Hallux valgus (acquired), right foot: Secondary | ICD-10-CM | POA: Diagnosis not present

## 2021-11-22 DIAGNOSIS — M2012 Hallux valgus (acquired), left foot: Secondary | ICD-10-CM | POA: Diagnosis not present

## 2021-11-22 DIAGNOSIS — B351 Tinea unguium: Secondary | ICD-10-CM | POA: Diagnosis not present

## 2021-11-22 DIAGNOSIS — M2010 Hallux valgus (acquired), unspecified foot: Secondary | ICD-10-CM | POA: Diagnosis not present

## 2021-12-06 ENCOUNTER — Other Ambulatory Visit (HOSPITAL_COMMUNITY): Payer: Self-pay

## 2021-12-08 ENCOUNTER — Other Ambulatory Visit: Payer: Self-pay | Admitting: Nurse Practitioner

## 2021-12-09 ENCOUNTER — Other Ambulatory Visit (HOSPITAL_COMMUNITY): Payer: Self-pay

## 2021-12-09 ENCOUNTER — Other Ambulatory Visit: Payer: Self-pay | Admitting: Oncology

## 2021-12-09 DIAGNOSIS — C61 Malignant neoplasm of prostate: Secondary | ICD-10-CM

## 2021-12-09 MED ORDER — ENZALUTAMIDE 40 MG PO TABS
ORAL_TABLET | ORAL | 0 refills | Status: DC
  Filled 2021-12-09 – 2021-12-14 (×2): qty 120, 30d supply, fill #0

## 2021-12-14 ENCOUNTER — Other Ambulatory Visit (HOSPITAL_COMMUNITY): Payer: Self-pay

## 2021-12-15 ENCOUNTER — Other Ambulatory Visit (HOSPITAL_COMMUNITY): Payer: Self-pay

## 2021-12-29 ENCOUNTER — Inpatient Hospital Stay: Payer: Medicare PPO

## 2021-12-29 ENCOUNTER — Other Ambulatory Visit (HOSPITAL_COMMUNITY): Payer: Self-pay

## 2021-12-29 ENCOUNTER — Inpatient Hospital Stay: Payer: Medicare PPO | Admitting: Oncology

## 2021-12-29 ENCOUNTER — Inpatient Hospital Stay: Payer: Medicare PPO | Attending: Nurse Practitioner

## 2022-01-03 ENCOUNTER — Other Ambulatory Visit: Payer: Self-pay

## 2022-01-03 ENCOUNTER — Other Ambulatory Visit (HOSPITAL_COMMUNITY): Payer: Self-pay

## 2022-01-03 ENCOUNTER — Other Ambulatory Visit: Payer: Self-pay | Admitting: Oncology

## 2022-01-03 DIAGNOSIS — C61 Malignant neoplasm of prostate: Secondary | ICD-10-CM

## 2022-01-03 MED ORDER — ENZALUTAMIDE 40 MG PO TABS
ORAL_TABLET | ORAL | 0 refills | Status: DC
  Filled 2022-01-03: qty 120, 30d supply, fill #0

## 2022-01-04 ENCOUNTER — Encounter: Payer: Self-pay | Admitting: Oncology

## 2022-01-04 ENCOUNTER — Other Ambulatory Visit (HOSPITAL_COMMUNITY): Payer: Self-pay

## 2022-01-04 ENCOUNTER — Telehealth: Payer: Self-pay | Admitting: Pharmacy Technician

## 2022-01-04 DIAGNOSIS — M2010 Hallux valgus (acquired), unspecified foot: Secondary | ICD-10-CM | POA: Diagnosis not present

## 2022-01-04 DIAGNOSIS — B351 Tinea unguium: Secondary | ICD-10-CM | POA: Diagnosis not present

## 2022-01-04 DIAGNOSIS — B353 Tinea pedis: Secondary | ICD-10-CM | POA: Diagnosis not present

## 2022-01-04 DIAGNOSIS — E119 Type 2 diabetes mellitus without complications: Secondary | ICD-10-CM | POA: Diagnosis not present

## 2022-01-04 NOTE — Telephone Encounter (Signed)
Oral Oncology Patient Advocate Encounter   Was successful in securing patient an $7,000 grant from Patient [facility]) to provide copayment coverage for Xtandi.  This will keep the out of pocket expense at $0.      The billing information is as follows and has been shared with [place].   Member ID: [id number] Group ID: [id number] RxBin: [id number] Dates of Eligibility: 10/06/21 through 10/06/22  Fund:  [place], CPhT-Adv Oncology Pharmacy Patient [place] Direct Number: [phone]  Fax: [phone]

## 2022-01-10 ENCOUNTER — Other Ambulatory Visit (HOSPITAL_COMMUNITY): Payer: Self-pay

## 2022-01-12 ENCOUNTER — Other Ambulatory Visit (HOSPITAL_COMMUNITY): Payer: Self-pay

## 2022-01-13 ENCOUNTER — Other Ambulatory Visit: Payer: Self-pay | Admitting: Nurse Practitioner

## 2022-01-13 DIAGNOSIS — C61 Malignant neoplasm of prostate: Secondary | ICD-10-CM

## 2022-01-13 DIAGNOSIS — E119 Type 2 diabetes mellitus without complications: Secondary | ICD-10-CM

## 2022-01-25 ENCOUNTER — Telehealth: Payer: Self-pay | Admitting: Oncology

## 2022-01-25 NOTE — Telephone Encounter (Signed)
Called patient per Dr. Alen Blew transition. Patient notified and forwarding information to [facility] to get patient scheduled soon with Dr. Delton Coombes.

## 2022-01-26 ENCOUNTER — Other Ambulatory Visit: Payer: Self-pay

## 2022-01-26 DIAGNOSIS — C61 Malignant neoplasm of prostate: Secondary | ICD-10-CM

## 2022-01-26 NOTE — Progress Notes (Signed)
Lab orders placed per Dr. Delton Coombes

## 2022-02-01 ENCOUNTER — Other Ambulatory Visit (HOSPITAL_COMMUNITY): Payer: Self-pay

## 2022-02-03 ENCOUNTER — Other Ambulatory Visit: Payer: Self-pay | Admitting: Oncology

## 2022-02-03 ENCOUNTER — Other Ambulatory Visit (HOSPITAL_COMMUNITY): Payer: Self-pay

## 2022-02-03 DIAGNOSIS — C61 Malignant neoplasm of prostate: Secondary | ICD-10-CM

## 2022-02-03 MED ORDER — ENZALUTAMIDE 40 MG PO TABS
ORAL_TABLET | ORAL | 0 refills | Status: DC
  Filled 2022-02-03: qty 120, 30d supply, fill #0

## 2022-02-07 ENCOUNTER — Inpatient Hospital Stay: Payer: Medicare PPO | Attending: Nurse Practitioner

## 2022-02-07 ENCOUNTER — Inpatient Hospital Stay (HOSPITAL_BASED_OUTPATIENT_CLINIC_OR_DEPARTMENT_OTHER): Payer: Medicare PPO | Admitting: Hematology

## 2022-02-07 ENCOUNTER — Encounter: Payer: Self-pay | Admitting: Hematology

## 2022-02-07 ENCOUNTER — Other Ambulatory Visit (HOSPITAL_COMMUNITY): Payer: Self-pay

## 2022-02-07 ENCOUNTER — Inpatient Hospital Stay: Payer: Medicare PPO

## 2022-02-07 VITALS — BP 171/95 | HR 57 | Temp 97.3°F | Resp 16 | Ht 69.0 in | Wt 195.9 lb

## 2022-02-07 DIAGNOSIS — C775 Secondary and unspecified malignant neoplasm of intrapelvic lymph nodes: Secondary | ICD-10-CM | POA: Diagnosis not present

## 2022-02-07 DIAGNOSIS — C61 Malignant neoplasm of prostate: Secondary | ICD-10-CM

## 2022-02-07 DIAGNOSIS — Z5111 Encounter for antineoplastic chemotherapy: Secondary | ICD-10-CM | POA: Diagnosis not present

## 2022-02-07 DIAGNOSIS — E119 Type 2 diabetes mellitus without complications: Secondary | ICD-10-CM | POA: Diagnosis not present

## 2022-02-07 DIAGNOSIS — I10 Essential (primary) hypertension: Secondary | ICD-10-CM | POA: Insufficient documentation

## 2022-02-07 DIAGNOSIS — C7951 Secondary malignant neoplasm of bone: Secondary | ICD-10-CM | POA: Insufficient documentation

## 2022-02-07 LAB — CBC WITH DIFFERENTIAL/PLATELET
Abs Immature Granulocytes: 0.02 10*3/uL (ref 0.00–0.07)
Basophils Absolute: 0 10*3/uL (ref 0.0–0.1)
Basophils Relative: 0 %
Eosinophils Absolute: 0.4 10*3/uL (ref 0.0–0.5)
Eosinophils Relative: 5 %
HCT: 45.4 % (ref 39.0–52.0)
Hemoglobin: 14.9 g/dL (ref 13.0–17.0)
Immature Granulocytes: 0 %
Lymphocytes Relative: 24 %
Lymphs Abs: 1.7 10*3/uL (ref 0.7–4.0)
MCH: 33.8 pg (ref 26.0–34.0)
MCHC: 32.8 g/dL (ref 30.0–36.0)
MCV: 102.9 fL — ABNORMAL HIGH (ref 80.0–100.0)
Monocytes Absolute: 0.4 10*3/uL (ref 0.1–1.0)
Monocytes Relative: 6 %
Neutro Abs: 4.5 10*3/uL (ref 1.7–7.7)
Neutrophils Relative %: 65 %
Platelets: 164 10*3/uL (ref 150–400)
RBC: 4.41 MIL/uL (ref 4.22–5.81)
RDW: 14.2 % (ref 11.5–15.5)
WBC: 7 10*3/uL (ref 4.0–10.5)
nRBC: 0 % (ref 0.0–0.2)

## 2022-02-07 LAB — COMPREHENSIVE METABOLIC PANEL
ALT: 10 U/L (ref 0–44)
AST: 15 U/L (ref 15–41)
Albumin: 3.6 g/dL (ref 3.5–5.0)
Alkaline Phosphatase: 69 U/L (ref 38–126)
Anion gap: 10 (ref 5–15)
BUN: 12 mg/dL (ref 8–23)
CO2: 28 mmol/L (ref 22–32)
Calcium: 9.1 mg/dL (ref 8.9–10.3)
Chloride: 101 mmol/L (ref 98–111)
Creatinine, Ser: 0.84 mg/dL (ref 0.61–1.24)
GFR, Estimated: >60 mL/min (ref >60)
Glucose, Bld: 80 mg/dL (ref 70–99)
Potassium: 4 mmol/L (ref 3.5–5.1)
Sodium: 139 mmol/L (ref 135–145)
Total Bilirubin: 0.6 mg/dL (ref 0.3–1.2)
Total Protein: 7.5 g/dL (ref 6.5–8.1)

## 2022-02-07 LAB — MAGNESIUM: Magnesium: 1.7 mg/dL (ref 1.7–2.4)

## 2022-02-07 LAB — PSA: Prostatic Specific Antigen: <0.01 ng/mL (ref 0.00–4.00)

## 2022-02-07 MED ORDER — LEUPROLIDE ACETATE (4 MONTH) 30 MG ~~LOC~~ KIT
30.0000 mg | PACK | Freq: Once | SUBCUTANEOUS | Status: AC
  Administered 2022-02-07: 30 mg via SUBCUTANEOUS
  Filled 2022-02-07: qty 30

## 2022-02-07 NOTE — Progress Notes (Signed)
[facility] [address], [place] [address]   CLINIC:  Medical Oncology/Hematology  Patient Care Team: [name], [name] as PCP - General (General Practice) Derek Jack, MD as Medical Oncologist (Medical Oncology)      CHIEF COMPLAINTS/PURPOSE OF CONSULTATION:  Transfer of care from Dr. Zola Button for prostate cancer.  PRIOR THERAPY: Androgen deprivation therapy initially with Mills Koller, subsequently switched to Lupron Abiraterone with prednisone from August 2018 through January 2022  CURRENT THERAPY: Xtandi 160 mg daily started on March 01, 2020.  Eligard 30 mg every 4 months.   HISTORY OF PRESENTING ILLNESS:  Kyle George 72 y.o. male is here for transfer of care and follow up of prostate cancer.  Today, he reports that he is doing well overall. His appetite is at 70% and his energy levels are at 80%. He is taking Xtandi without any negative side effects. He denies any pain, leg swelling, numbness/tingling, hot flashes, urinary symptoms, fatigue, or weight loss. He states that he is functioning well and performing ADLs without any difficulty. He reports missing his last Eligard injection due to confusion with his transfer of care.  His father and paternal uncle had cancer but patient is unsure what types of cancer. He knows that they did not have prostate cancer. He denies any other family history of cancer.  He was smoking 1ppd for 30+ years but is now smoking 3-4 cigarettes per day. He denies any Hx of heart attacks or stroke.   MEDICAL HISTORY:  Past Medical History:  Diagnosis Date   Cancer of prostate ([place])    Diabetes mellitus without complication ([place])    Hyperlipidemia    Hypertension     SURGICAL HISTORY: Past Surgical History:  Procedure Laterality Date   COLONOSCOPY     HEMORRHOID SURGERY      SOCIAL HISTORY: Social History   Socioeconomic History   Marital status: Married    Spouse name: Not on file   Number  of children: Not on file   Years of education: Not on file   Highest education level: Not on file  Occupational History   Not on file  Tobacco Use   Smoking status: Some Days    Packs/day: 0.25    Years: 50.00    Total pack years: 12.50    Types: Cigarettes   Smokeless tobacco: Former    Types: Chew    Quit date: 1975   Tobacco comments:    smoked about 1/2 PPD for about 10 years; now at 4-5 cigarettes per day   Vaping Use   Vaping Use: Never used  Substance and Sexual Activity   Alcohol use: Yes    Comment: occassionally   Drug use: Never   Sexual activity: Yes  Other Topics Concern   Not on file  Social History Narrative   He previously work in Nurse, 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   Social Determinants of Health   Financial Resource Strain: Low Risk  (08/10/2021)   Overall Financial Resource Strain (CARDIA)    Difficulty of Paying Living Expenses: Not hard at all  Food Insecurity: No Food Insecurity (02/07/2022)   Hunger Vital Sign    Worried About Running Out of Food in the Last Year: Never true    Ran Out of Food in the Last Year: Never true  Transportation Needs: No Transportation Needs (02/07/2022)   PRAPARE - Hydrologist (Medical): No    Lack of Transportation (  Non-Medical): No  Physical Activity: Inactive (08/10/2021)   Exercise Vital Sign    Days of Exercise per Week: 0 days    Minutes of Exercise per Session: 0 min  Stress: No Stress Concern Present (08/10/2021)   [place]    Feeling of Stress : Not at all  Social Connections: Not on file  Intimate Partner Violence: Not At Risk (02/07/2022)   Humiliation, Afraid, Rape, and Kick questionnaire    Fear of Current or Ex-Partner: No    Emotionally Abused: No    Physically Abused: No    Sexually Abused: No    FAMILY HISTORY: Family History  Problem Relation Age of Onset   Hypertension Mother    Diabetes Mother     Cancer Father        He is unsure of what type of cancer but it was not prostate cancer   Cancer Paternal Uncle        Pt is unsure of what type of cancer   Colon cancer Neg Hx    Esophageal cancer Neg Hx    Stomach cancer Neg Hx    Rectal cancer Neg Hx     ALLERGIES:  has No Known Allergies.  MEDICATIONS:  Current Outpatient Medications  Medication Sig Dispense Refill   aspirin 81 MG tablet Take 81 mg by mouth daily.     B-D ULTRAFINE III SHORT PEN 31G X 8 MM MISC USE AS DIRECTED WITH LEVEMIR PEN 100 each 5   blood glucose meter kit and supplies KIT Dispense based on patient and insurance preference. Use up to four times daily as directed. 1 each 0   Blood Pressure Monitoring (BLOOD PRESSURE CUFF) MISC by Does not apply route. Check blood pressure at least 4 times per week 3 hours after taking medication     Calcium Carbonate-Vitamin D (CALCIUM 600+D PO) Take 1 tablet by mouth daily.     carvedilol (COREG) 6.25 MG tablet TAKE 1 TABLET BY MOUTH TWICE A DAY WITH FOOD 180 tablet 1   cholecalciferol (VITAMIN D) 1000 UNITS tablet Take 5,000 Units by mouth daily.     enzalutamide (XTANDI) 40 MG tablet TAKE 4 TABLETS (160 MG TOTAL) BY MOUTH DAILY. 120 tablet 0   glucose blood test strip 1 each by Other route as needed for other. Use as instructed to check blood sugars     insulin detemir (LEVEMIR FLEXPEN) 100 UNIT/ML FlexPen INJECT 15 UNITS INTO THE SKIN DAILY. 15 mL 1   Insulin Syringe-Needle U-100 31G X 5/16" 0.3 ML MISC by Does not apply route. Use as directed with levemir pen     JANUVIA 100 MG tablet TAKE 1 TABLET BY MOUTH EVERY DAY 90 tablet 0   Lancets (ONETOUCH DELICA PLUS FOYDXA12I) MISC by Does not apply route. Use as directed to check blood  Sugars 2 times per day dx E11.65     Multiple Vitamin (MULTIVITAMIN WITH MINERALS) TABS Take 1 tablet by mouth daily.     NON FORMULARY Take 1 tablet by mouth daily. Thyroid  action     Olmesartan-amLODIPine-HCTZ 40-10-25 MG TABS TAKE 1  TABLET BY MOUTH EVERY DAY 90 tablet 1   Omega-3 Fatty Acids (FISH OIL PO) Take 1 tablet by mouth once.     pioglitazone-metformin (ACTOPLUS MET) 15-850 MG tablet TAKE 1 TABLET BY MOUTH TWICE A DAY 180 tablet 1   predniSONE (DELTASONE) 5 MG tablet TAKE 1 TABLET BY MOUTH EVERY DAY WITH BREAKFAST  30 tablet 1   rosuvastatin (CRESTOR) 10 MG tablet TAKE 1 TABLET BY MOUTH EVERY DAY 90 tablet 1   spironolactone (ALDACTONE) 25 MG tablet TAKE 1 TABLET BY MOUTH EVERY DAY 90 tablet 1   No current facility-administered medications for this visit.   Facility-Administered Medications Ordered in Other Visits  Medication Dose Route Frequency Provider Last Rate Last Admin   Leuprolide Acetate (4 Month) (ELIGARD) injection 30 mg  30 mg Subcutaneous Once Derek Jack, MD        REVIEW OF SYSTEMS:   Review of Systems  Constitutional:  Negative for chills, fatigue and fever.  HENT:   Negative for lump/mass, mouth sores, nosebleeds, sore throat and trouble swallowing.   Respiratory:  Negative for cough and shortness of breath.   Cardiovascular:  Negative for chest pain, leg swelling and palpitations.  Gastrointestinal:  Negative for abdominal pain, constipation, diarrhea, nausea and vomiting.  Genitourinary:  Negative for bladder incontinence, difficulty urinating, dysuria, frequency, hematuria and nocturia.   Musculoskeletal:  Negative for arthralgias, back pain, flank pain, myalgias and neck pain.  Skin:  Negative for itching and rash.  Neurological:  Negative for dizziness, headaches and numbness.  Hematological:  Does not bruise/bleed easily.  Psychiatric/Behavioral:  Negative for depression, sleep disturbance and suicidal ideas. The patient is not nervous/anxious.   All other systems reviewed and are negative.    PHYSICAL EXAMINATION: ECOG PERFORMANCE STATUS: 1 - Symptomatic but completely ambulatory  Vitals:   02/07/22 1326  BP: (!) 171/95  Pulse: (!) 57  Resp: 16  Temp: (!) 97.3 F  (36.3 C)  SpO2: 98%   Filed Weights   02/07/22 1326  Weight: 88.9 kg (195 lb 14.4 oz)    Physical Exam Vitals reviewed.  Constitutional:      Appearance: Normal appearance.  Cardiovascular:     Rate and Rhythm: Normal rate and regular rhythm.     Pulses: Normal pulses.     Heart sounds: Normal heart sounds.  Pulmonary:     Effort: Pulmonary effort is normal.     Breath sounds: Normal breath sounds.  Abdominal:     Palpations: Abdomen is soft. There is no hepatomegaly, splenomegaly or mass.     Tenderness: There is no abdominal tenderness.  Musculoskeletal:     Right lower leg: No edema.     Left lower leg: No edema.  Lymphadenopathy:     Cervical:     Right cervical: No superficial cervical adenopathy.    Left cervical: No superficial cervical adenopathy.     Upper Body:     Right upper body: No supraclavicular, axillary or pectoral adenopathy.     Left upper body: No supraclavicular, axillary or pectoral adenopathy.  Neurological:     General: No focal deficit present.     Mental Status: He is alert and oriented to person, place, and time.  Psychiatric:        Mood and Affect: Mood normal.        Behavior: Behavior normal.      LABORATORY DATA:  I have reviewed the data as listed Lab Results  Component Value Date   WBC 7.0 02/07/2022   HGB 14.9 02/07/2022   HCT 45.4 02/07/2022   MCV 102.9 (H) 02/07/2022   PLT 164 02/07/2022      Component Value Date/Time   NA 139 02/07/2022 1224   NA 138 03/29/2021 1523   NA 140 11/14/2016 1545   K 4.0 02/07/2022 1224   K 3.6 11/14/2016 1545  CL 101 02/07/2022 1224   CO2 28 02/07/2022 1224   CO2 26 11/14/2016 1545   GLUCOSE 80 02/07/2022 1224   GLUCOSE 110 11/14/2016 1545   BUN 12 02/07/2022 1224   BUN 19 03/29/2021 1523   BUN 12.8 11/14/2016 1545   CREATININE 0.84 02/07/2022 1224   CREATININE 0.72 09/02/2021 1303   CREATININE 0.8 11/14/2016 1545   CALCIUM 9.1 02/07/2022 1224   CALCIUM 9.4 11/14/2016 1545    PROT 7.5 02/07/2022 1224   PROT 6.6 03/29/2021 1523   PROT 7.5 11/14/2016 1545   ALBUMIN 3.6 02/07/2022 1224   ALBUMIN 4.0 03/29/2021 1523   ALBUMIN 3.6 11/14/2016 1545   AST 15 02/07/2022 1224   AST 10 (L) 09/02/2021 1303   AST 17 11/14/2016 1545   ALT 10 02/07/2022 1224   ALT 5 09/02/2021 1303   ALT 16 11/14/2016 1545   ALKPHOS 69 02/07/2022 1224   ALKPHOS 86 11/14/2016 1545   BILITOT 0.6 02/07/2022 1224   BILITOT 0.4 09/02/2021 1303   BILITOT 0.30 11/14/2016 1545   GFRNONAA >60 02/07/2022 1224   GFRNONAA >60 09/02/2021 1303   GFRAA >60 06/18/2019 0914   Lab Results  Component Value Date   PSA1 <0.1 09/02/2021   PSA1 <0.1 04/29/2021   PSA1 <0.1 11/12/2020   PSA 78.69 (H) 03/13/2014     RADIOGRAPHIC STUDIES: I have personally reviewed the radiological images as listed and agreed with the findings in the report. No results found.   ASSESSMENT:  1.  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 - CT CAP (03/24/2020): Mildly improved retroperitoneal and left pelvic sidewall adenopathy but with new and substantially increased sclerosis metastatic lesions.  Upper normal partially calcified subcarinal lymph nodes. - Xtandi 160 mg daily started on 03/01/2020.  2.  Social/family history: - Lives at home with his wife.  Independent of ADLs and IADLs.  Does gardening.  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 - Current active smoker, 3 to 4 cigarettes/day.  Smoked 1 pack/day for more than 30 years. - Father and paternal uncle had  cancers, type not known to the patient.   PLAN:  1.  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 - He is tolerating enzalutamide 160 mg daily very well. - We reviewed labs today which showed normal chemistries and electrolytes.  CBC was grossly normal.  PSA is still undetectable. - Continue enzalutamide at the same dose.  We will give him Eligard injection 30 mg today. - We discussed germline mutation testing.  He is agreeable.  Will make referral to our geneticist. - RTC 4 months for follow-up with repeat PSA.  If PSA trends up, consider repeat imaging.  2.  Metastatic bone disease: - Continue calcium and vitamin D supplements. - I will talk to him about denosumab to decrease SRE at next visit.   Orders Placed This Encounter  Procedures   CBC with Differential    Standing Status:   Future    Standing Expiration Date:   02/07/2023   Comprehensive metabolic panel  Standing Status:   Future    Standing Expiration Date:   02/07/2023   PSA    Standing Status:   Future    Standing Expiration Date:   02/07/2023   Vitamin D 25 hydroxy    Standing Status:   Future    Standing Expiration Date:   02/07/2023   Ambulatory referral to Social Work    Referral Priority:   Routine    Referral Type:   Consultation    Referral Reason:   Specialty Services Required    Number of Visits Requested:   1   Ambulatory referral to Genetics    Referral Priority:   Routine    Referral Type:   Consultation    Referral Reason:   Specialty Services Required    Number of Visits Requested:   1     All questions were answered. The patient knows to call the clinic with any problems, questions or concerns.     I,Alexis Herring,acting as a Education administrator for [employer], MD.,have documented all relevant documentation on the behalf of Derek Jack, MD,as directed by  Derek Jack, MD while in the presence of Derek Jack, MD.  I, Derek Jack MD, have reviewed the above documentation for accuracy and completeness, and I agree with the above.     Alexis Herring 02/07/2022 1:53 PM

## 2022-02-07 NOTE — Progress Notes (Signed)
Patient has been assessed, vital signs and labs have been reviewed by Dr. Delton Coombes. ANC, Creatinine, LFTs, and Platelets are within treatment parameters per Dr. Delton Coombes. The patient is good to proceed with treatment at this time. Primary RN and pharmacy aware.  Patient is taking Xtandi as prescribed. He has not missed any doses and reports no side effects at this time.

## 2022-02-07 NOTE — Progress Notes (Signed)
Kyle George presents today for injection per the provider's orders.  Eligard  administration without incident; injection site WNL; see MAR for injection details.  Patient tolerated procedure well and without incident. Discharged from clinic ambulatory in stable condition. Alert and oriented x 3. F/U with [facility] as scheduled.

## 2022-02-07 NOTE — Patient Instructions (Signed)
Kyle George  Discharge Instructions: Thank you for choosing [facility] to provide your oncology and hematology care.  If you have a lab appointment with the [facility], please come in thru the Main Entrance and check in at the main information desk.  Wear comfortable clothing and clothing appropriate for easy access to any Portacath or PICC line.   We strive to give you quality time with your provider. You may need to reschedule your appointment if you arrive late (15 or more minutes).  Arriving late affects you and other patients whose appointments are after yours.  Also, if you miss three or more appointments without notifying the office, you may be dismissed from the clinic at the provider's discretion.      For prescription refill requests, have your pharmacy contact our office and allow 72 hours for refills to be completed.    Today you received the following chemotherapy and/or immunotherapy agents Eligard. Leuprolide Suspension for Injection (Prostate Cancer) What is this medication? LEUPROLIDE (loo PROE lide) reduces the symptoms of prostate cancer. It works by decreasing levels of the hormone testosterone in the body. This prevents prostate cancer cells from spreading or growing. This medicine may be used for other purposes; ask your health care provider or pharmacist if you have questions. COMMON BRAND NAME(S): Eligard, Lupron Depot, Lupron Depot-Ped, Lutrate Depot, Viadur What should I tell my care team before I take this medication? They need to know if you have any of these conditions: Diabetes Heart disease Heart failure High or low levels of electrolytes, such as magnesium, potassium, or sodium in your blood Irregular heartbeat or rhythm Seizures An unusual or allergic reaction to leuprolide, other medications, foods, dyes, or preservatives Pregnant or trying to get pregnant Breast-feeding How should I use this medication? This  medication is injected under the skin or into a muscle. It is given by your care team in a hospital or clinic setting. Talk to your care team about the use of this medication in children. Special care may be needed. Overdosage: If you think you have taken too much of this medicine contact a poison control center or emergency room at once. NOTE: This medicine is only for you. Do not share this medicine with others. What if I miss a dose? Keep appointments for follow-up doses. It is important not to miss your dose. Call your care team if you are unable to keep an appointment. What may interact with this medication? Do not take this medication with any of the following: Cisapride Dronedarone Ketoconazole Levoketoconazole Pimozide Thioridazine This medication may also interact with the following: Other medications that cause heart rhythm changes This list may not describe all possible interactions. Give your health care provider a list of all the medicines, herbs, non-prescription drugs, or dietary supplements you use. Also tell them if you smoke, drink alcohol, or use illegal drugs. Some items may interact with your medicine. What should I watch for while using this medication? Visit your care team for regular checks on your progress. Tell your care team if your symptoms do not start to get better or if they get worse. This medication may increase blood sugar. The risk may be higher in patients who already have diabetes. Ask your care team what you can do to lower the risk of diabetes while taking this medication. This medication may cause infertility. Talk to your care team if you are concerned about your fertility. Heart attacks and strokes have been reported  with the use of this medication. Get emergency help if you develop signs or symptoms of a heart attack or stroke. Talk to your care team about the risks and benefits of this medication. What side effects may I notice from receiving this  medication? Side effects that you should report to your care team as soon as possible: Allergic reactions--skin rash, itching, hives, swelling of the face, lips, tongue, or throat Heart attack--pain or tightness in the chest, shoulders, arms, or jaw, nausea, shortness of breath, cold or clammy skin, feeling faint or lightheaded Heart rhythm changes--fast or irregular heartbeat, dizziness, feeling faint or lightheaded, chest pain, trouble breathing High blood sugar (hyperglycemia)--increased thirst or amount of urine, unusual weakness or fatigue, blurry vision Mood swings, irritability, hostility Seizures Stroke--sudden numbness or weakness of the face, arm, or leg, trouble speaking, confusion, trouble walking, loss of balance or coordination, dizziness, severe headache, change in vision Thoughts of suicide or self-harm, worsening mood, feelings of depression Side effects that usually do not require medical attention (report to your care team if they continue or are bothersome): Bone pain Change in sex drive or performance General discomfort and fatigue Hot flashes Muscle pain Pain, redness, or irritation at injection site Swelling of the ankles, hands, or feet This list may not describe all possible side effects. Call your doctor for medical advice about side effects. You may report side effects to FDA at 1-800-FDA-1088. Where should I keep my medication? This medication is given in a hospital or clinic. It will not be stored at home. NOTE: This sheet is a summary. It may not cover all possible information. If you have questions about this medicine, talk to your doctor, pharmacist, or health care provider.  2023 Elsevier/Gold Standard (2021-03-09 00:00:00)       To help prevent nausea and vomiting after your treatment, we encourage you to take your nausea medication as directed.  BELOW ARE SYMPTOMS THAT SHOULD BE REPORTED IMMEDIATELY: *FEVER GREATER THAN 100.4 F (38 C) OR  HIGHER *CHILLS OR SWEATING *NAUSEA AND VOMITING THAT IS NOT CONTROLLED WITH YOUR NAUSEA MEDICATION *UNUSUAL SHORTNESS OF BREATH *UNUSUAL BRUISING OR BLEEDING *URINARY PROBLEMS (pain or burning when urinating, or frequent urination) *BOWEL PROBLEMS (unusual diarrhea, constipation, pain near the anus) TENDERNESS IN MOUTH AND THROAT WITH OR WITHOUT PRESENCE OF ULCERS (sore throat, sores in mouth, or a toothache) UNUSUAL RASH, SWELLING OR PAIN  UNUSUAL VAGINAL DISCHARGE OR ITCHING   Items with * indicate a potential emergency and should be followed up as soon as possible or go to the Emergency Department if any problems should occur.  Please show the CHEMOTHERAPY ALERT CARD or IMMUNOTHERAPY ALERT CARD at check-in to the Emergency Department and triage nurse.  Should you have questions after your visit or need to cancel or reschedule your appointment, please contact [facility] [phone]  and follow the prompts.  Office hours are 8:00 a.m. to 4:30 p.m. Monday - Friday. Please note that voicemails left after 4:00 p.m. may not be returned until the following business day.  We are closed weekends and major holidays. You have access to a nurse at all times for urgent questions. Please call the main number to the clinic [phone] and follow the prompts.  For any non-urgent questions, you may also contact your provider using MyChart. We now offer e-Visits for anyone 41 and older to request care online for non-urgent symptoms. For details visit [email or web address].   Also download the MyChart app! Go to the app store,  search "MyChart", open the app, select [facility], and log in with your MyChart username and password.

## 2022-02-07 NOTE — Patient Instructions (Addendum)
[facility]  Discharge Instructions  You were seen and examined today by Dr. Delton Coombes. Dr. Delton Coombes is a medical oncologist, meaning that he specializes in the treatment of cancer diagnoses. Dr. Delton Coombes discussed your past medical history, family history of cancers, and the events that led to you being here today.  You were referred to Dr. Delton Coombes for ongoing care of your Prostate Cancer.  Continue Xtandi as prescribed. You will get your injection today.  Follow-up as scheduled.  Thank you for choosing [facility] to provide your oncology and hematology care.   To afford each patient quality time with our provider, please arrive at least 15 minutes before your scheduled appointment time. You may need to reschedule your appointment if you arrive late (10 or more minutes). Arriving late affects you and other patients whose appointments are after yours.  Also, if you miss three or more appointments without notifying the office, you may be dismissed from the clinic at the provider's discretion.    Again, thank you for choosing [facility].  Our hope is that these requests will decrease the amount of time that you wait before being seen by our physicians.   If you have a lab appointment with the [facility] please come in thru the Main Entrance and check in at the main information desk.           _____________________________________________________________  Should you have questions after your visit to [facility], please contact our office at [phone] and follow the prompts.  Our office hours are 8:00 a.m. to 4:30 p.m. Monday - Thursday and 8:00 a.m. to 2:30 p.m. Friday.  Please note that voicemails left after 4:00 p.m. may not be returned until the following business day.  We are closed weekends and all major holidays.  You do have access to a nurse 24-7, just call the main number to the clinic  [phone] and do not press any options, hold on the line and a nurse will answer the phone.    For prescription refill requests, have your pharmacy contact our office and allow 72 hours.    Masks are optional in the cancer centers. If you would like for your care team to wear a mask while they are taking care of you, please let them know. You may have one support person who is at least 73 years old accompany you for your appointments.

## 2022-02-07 NOTE — Progress Notes (Signed)
Patient is taking Xstandi as prescribed.  He has not missed any doses and reports no side effects at this time.

## 2022-02-09 ENCOUNTER — Other Ambulatory Visit (HOSPITAL_COMMUNITY): Payer: Self-pay

## 2022-02-09 LAB — TESTOSTERONE: Testosterone: 87 ng/dL — ABNORMAL LOW (ref 264–916)

## 2022-02-16 ENCOUNTER — Inpatient Hospital Stay: Payer: Medicare PPO | Attending: Nurse Practitioner | Admitting: Licensed Clinical Social Worker

## 2022-02-16 DIAGNOSIS — C61 Malignant neoplasm of prostate: Secondary | ICD-10-CM

## 2022-02-16 NOTE — Progress Notes (Signed)
[place] Work  Initial Assessment   ATHANASIUS KESLING is a 72 y.o. year old male contacted by phone. Clinical Social Work was referred by medical provider for assessment of psychosocial needs.   SDOH (Social Determinants of Health) assessments performed: Yes SDOH Interventions    Flowsheet Row Care Coordination from 11/17/2021 in [place] from 03/20/2019 in [facility] Clinical Support from 12/03/2018 in [facility]  SDOH Interventions     Food Insecurity Interventions Intervention Not Indicated -- --  Housing Interventions Intervention Not Indicated -- --  Transportation Interventions Intervention Not Indicated -- --  Utilities Interventions Intervention Not Indicated -- --  Depression Interventions/Treatment  -- ZOX0-9 Score <4 Follow-up Not Indicated PHQ2-9 Score <4 Follow-up Not Indicated       SDOH Screenings   Food Insecurity: No Food Insecurity (02/07/2022)  Housing: Low Risk  (02/07/2022)  Transportation Needs: No Transportation Needs (02/07/2022)  Utilities: Not At Risk (02/07/2022)  Depression (PHQ2-9): Low Risk  (02/07/2022)  Financial Resource Strain: Low Risk  (08/10/2021)  Physical Activity: Inactive (08/10/2021)  Stress: No Stress Concern Present (08/10/2021)  Tobacco Use: High Risk (02/07/2022)     Distress Screen completed: No     No data to display            Family/Social Information:  Housing Arrangement: patient lives with his wife and 1 of their daughters. Family members/support persons in your life? Pt has 6 children who reside in [place] and [place].  Pt's family is able to offer support as needed.  Pt has not needed support up to this point. 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  Income source: Paediatric nurse concerns: Yes, current concerns Type of  concern: Medical bills Food access concerns: no Religious or spiritual practice: Publishing copy Currently in place:  none  Coping/ Adjustment to diagnosis: Patient understands treatment plan and what happens next? yes Concerns about diagnosis and/or treatment: I'm not especially worried about anything Patient reported stressors: Veterinary surgeon and/or priorities: Pt's priority is to continue treatment w/ the hope of continued stability Patient enjoys gardening and time with family/ friends Current coping skills/ strengths: Capable of independent living , Motivation for treatment/growth , Physical Health , Religious Affiliation , and Supportive family/friends     SUMMARY: Current SDOH Barriers:  Financial constraints related to fixed income  Clinical Social Work Clinical Goal(s):  Explore community resource options for unmet needs related to:  Financial Strain   Interventions: Discussed common feeling and emotions when being diagnosed with cancer, and the importance of support during treatment Informed patient of the support team roles and support services at [facility] Provided [place] contact information and encouraged patient to call with any questions or concerns Provided patient with information about the [facility] for transportation reimbursement.  Discussed the [employer] w/ pt to provide additional financial support.  Pt is presently receiving a grant from [place] to assist w/ the cost of medication which will be active through October 2024.   Follow Up Plan: Patient will contact CSW with any support or resource needs Patient verbalizes understanding of plan: Yes    Henriette Combs, LCSW

## 2022-03-07 ENCOUNTER — Other Ambulatory Visit (HOSPITAL_COMMUNITY): Payer: Self-pay

## 2022-03-14 ENCOUNTER — Other Ambulatory Visit: Payer: Self-pay | Admitting: Hematology

## 2022-03-14 ENCOUNTER — Other Ambulatory Visit: Payer: Self-pay

## 2022-03-14 ENCOUNTER — Other Ambulatory Visit (HOSPITAL_COMMUNITY): Payer: Self-pay

## 2022-03-14 ENCOUNTER — Other Ambulatory Visit: Payer: Self-pay | Admitting: *Deleted

## 2022-03-14 DIAGNOSIS — C61 Malignant neoplasm of prostate: Secondary | ICD-10-CM

## 2022-03-14 MED ORDER — ENZALUTAMIDE 40 MG PO TABS
ORAL_TABLET | ORAL | 0 refills | Status: DC
  Filled 2022-03-14: qty 120, 30d supply, fill #0

## 2022-03-14 NOTE — Telephone Encounter (Signed)
Xtandi refill approved.  Patient is tolerating and is to continue therapy.

## 2022-03-15 ENCOUNTER — Other Ambulatory Visit (HOSPITAL_COMMUNITY): Payer: Self-pay

## 2022-03-23 ENCOUNTER — Inpatient Hospital Stay: Payer: Medicare PPO | Attending: Nurse Practitioner | Admitting: Licensed Clinical Social Worker

## 2022-03-23 NOTE — Progress Notes (Deleted)
REFERRING PROVIDER: Derek Jack, MD [address] [place],  [place] [address]  PRIMARY PROVIDER:  [name], [name]  PRIMARY REASON FOR VISIT:  1. Prostate cancer ([place])   2. 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 on 03/23/2022 at *** EDT by MyChart video conference and verified that I am speaking with the correct person using two identifiers.    Patient location: [place] Provider location: [place]:   Mr. Pigue, a 72 y.o. male, was seen for a [place] cancer genetics consultation at the request of Dr. Delton Coombes due to a personal history of metastatic prostate cancer.  Mr. Wenner presents to clinic today to discuss the possibility of a hereditary predisposition to cancer, genetic testing, and to further clarify his future cancer risks, as well as potential cancer risks for family members.   In 2013, at the age of 30, Mr. Street was diagnosed with metastatic prostate cancer, Gleason 4+4=8. 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  Significant diagnoses are listed below: Family History  Problem Relation Age of Onset   Hypertension Mother     Diabetes Mother    Cancer Father        He is unsure of what type of cancer but it was not prostate cancer   Cancer Paternal Uncle        Pt is unsure of what type of cancer   Colon cancer Neg Hx    Esophageal cancer Neg Hx    Stomach cancer Neg Hx    Rectal cancer Neg Hx     Mr. Dinges is unaware of previous family history of genetic testing for hereditary cancer risks. There is no reported Ashkenazi Jewish ancestry. There is no known consanguinity.  GENETIC COUNSELING ASSESSMENT: Mr. Cuny is a 72 y.o. male with a personal history of metastatic prostate cancer which is somewhat suggestive of a hereditary cancer syndrome and predisposition to cancer. We, therefore, discussed and recommended the following at today's visit.   DISCUSSION: We discussed that approximately 5-10% of prostate cancer is hereditary. Most cases of hereditary prostate cancer are associated with BRCA1/BRCA2 genes, although there are other genes associated with hereditary cancer as well. Cancers and risks are gene specific. We discussed that testing is beneficial for several reasons including knowing about cancer risks, identifying potential screening and risk-reduction options that may be appropriate, and to understand if other family members could be at risk for cancer and allow them to undergo genetic testing.   We reviewed the characteristics, features and inheritance patterns of hereditary cancer syndromes. We also discussed genetic testing, including the appropriate  family members to test, the process of testing, insurance coverage and turn-around-time for results. We discussed the implications of a negative, positive and/or variant of uncertain significant result. We recommended Mr. Cliett pursue genetic testing for the [facility] Multi-Cancer+RNA gene panel.   The Multi-Cancer + RNA Panel offered by [facility] includes sequencing and/or deletion/duplication analysis of the following 70 genes:  AIP*, ALK, APC*, ATM*, AXIN2*,  BAP1*, BARD1*, BLM*, BMPR1A*, BRCA1*, BRCA2*, BRIP1*, CDC73*, CDH1*, CDK4, CDKN1B*, CDKN2A, CHEK2*, CTNNA1*, DICER1*, EPCAM, EGFR, FH*, FLCN*, GREM1, HOXB13, KIT, LZTR1, MAX*, MBD4, MEN1*, MET, MITF, MLH1*, MSH2*, MSH3*, MSH6*, MUTYH*, NF1*, NF2*, NTHL1*, PALB2*, PDGFRA, PMS2*, POLD1*, POLE*, POT1*, PRKAR1A*, PTCH1*, PTEN*, RAD51C*, RAD51D*, RB1*, RET, SDHA*, SDHAF2*, SDHB*, SDHC*, SDHD*, SMAD4*, SMARCA4*, SMARCB1*, SMARCE1*, STK11*, SUFU*, TMEM127*, TP53*, TSC1*, TSC2*, VHL*. RNA analysis is performed for * genes.  Based on Mr. Schuessler's personal history of cancer, he meets medical criteria for genetic testing. Despite that he meets criteria, he may still have an out of pocket cost. We discussed that if his out of pocket cost for testing is over $100, the laboratory will call and confirm whether he wants to proceed with testing.  If the out of pocket cost of testing is less than $100 he will be billed by the genetic testing laboratory.   PLAN: After considering the risks, benefits, and limitations, Mr. Cordray provided informed consent to pursue genetic testing and the blood sample was sent to [facility] for analysis of the Multi-Cancer+RNA panel. Results should be available within approximately 2-3 weeks' time, at which point they will be disclosed by telephone to Mr. Dixson, as will any additional recommendations warranted by these results. Mr. Schleeter will receive a summary of his genetic counseling visit and a copy of his results once available. This information will also be available in Epic.   *** Despite our recommendation, Mr. Zecher did not wish to pursue genetic testing at today's visit. We understand this decision and remain available to coordinate genetic testing at any time in the future. We, therefore, recommend Mr. Thor continue to follow the cancer screening guidelines given by his primary healthcare provider.  Mr. Wobig questions were answered to his satisfaction today. Our  contact information was provided should additional questions or concerns arise. Thank you for the referral and allowing [place] to share in the care of your patient.   Faith Rogue, MS, [facility] Genetic Counselor [email or web address] Phone: [phone]  The patient was seen for a total of *** minutes in virtual genetic counseling.  Dr. Grayland Ormond was available for discussion regarding this case.   _______________________________________________________________________ For Office Staff:  Number of people involved in session:  Was an Intern/ student involved with case: no

## 2022-04-03 ENCOUNTER — Encounter: Payer: Self-pay | Admitting: Nurse Practitioner

## 2022-04-03 ENCOUNTER — Ambulatory Visit (INDEPENDENT_AMBULATORY_CARE_PROVIDER_SITE_OTHER): Payer: Medicare PPO | Admitting: Nurse Practitioner

## 2022-04-03 VITALS — BP 142/82 | HR 72 | Ht 69.0 in | Wt 196.0 lb

## 2022-04-03 DIAGNOSIS — E782 Mixed hyperlipidemia: Secondary | ICD-10-CM

## 2022-04-03 DIAGNOSIS — I700 Atherosclerosis of aorta: Secondary | ICD-10-CM | POA: Diagnosis not present

## 2022-04-03 DIAGNOSIS — E11628 Type 2 diabetes mellitus with other skin complications: Secondary | ICD-10-CM | POA: Diagnosis not present

## 2022-04-03 DIAGNOSIS — I10 Essential (primary) hypertension: Secondary | ICD-10-CM

## 2022-04-03 DIAGNOSIS — Z23 Encounter for immunization: Secondary | ICD-10-CM | POA: Diagnosis not present

## 2022-04-03 DIAGNOSIS — C61 Malignant neoplasm of prostate: Secondary | ICD-10-CM | POA: Diagnosis not present

## 2022-04-03 DIAGNOSIS — Z720 Tobacco use: Secondary | ICD-10-CM

## 2022-04-03 DIAGNOSIS — Z0001 Encounter for general adult medical examination with abnormal findings: Secondary | ICD-10-CM

## 2022-04-03 DIAGNOSIS — Z2821 Immunization not carried out because of patient refusal: Secondary | ICD-10-CM

## 2022-04-03 DIAGNOSIS — Z0000 Encounter for general adult medical examination without abnormal findings: Secondary | ICD-10-CM

## 2022-04-03 DIAGNOSIS — C7951 Secondary malignant neoplasm of bone: Secondary | ICD-10-CM | POA: Diagnosis not present

## 2022-04-03 DIAGNOSIS — Z794 Long term (current) use of insulin: Secondary | ICD-10-CM

## 2022-04-03 NOTE — Progress Notes (Signed)
I,Sheena H Holbrook,acting as a Neurosurgeon for Arnette Felts, [name].,have documented all relevant documentation on the behalf of Arnette Felts, [name],as directed by  Arnette Felts, [name] while in the presence of Arnette Felts, [name].   Subjective:     Patient ID: Kyle George , male    DOB: 1950-11-21 , 72 y.o.   MRN: [id number]   Chief Complaint  Patient presents with   Annual Exam    HPI  Patient presents today for annual exam. Patient had labs completed 02/07/2022. Patient declines further COVID vaccines at this time.   He is now chewing tobacco to cut back on his cigarettes     Past Medical History:  Diagnosis Date   Cancer of prostate (HCC)    Diabetes mellitus without complication (HCC)    Hyperlipidemia    Hypertension      Family History  Problem Relation Age of Onset   Hypertension Mother    Diabetes Mother    Cancer Father        He is unsure of what type of cancer but it was not prostate cancer   Cancer Paternal Uncle        Pt is unsure of what type of cancer   Colon cancer Neg Hx    Esophageal cancer Neg Hx    Stomach cancer Neg Hx    Rectal cancer Neg Hx      Current Outpatient Medications:    aspirin 81 MG tablet, Take 81 mg by mouth daily., Disp: , Rfl:    blood glucose meter kit and supplies KIT, Dispense based on patient and insurance preference. Use up to four times daily as directed., Disp: 1 each, Rfl: 0   Blood Pressure Monitoring (BLOOD PRESSURE CUFF) MISC, by Does not apply route. Check blood pressure at least 4 times per week 3 hours after taking medication, Disp: , Rfl:    Calcium Carbonate-Vitamin D (CALCIUM 600+D PO), Take 1 tablet by mouth daily., Disp: , Rfl:    carvedilol (COREG) 6.25 MG tablet, TAKE 1 TABLET BY MOUTH TWICE A DAY WITH FOOD, Disp: 180 tablet, Rfl: 1   cholecalciferol (VITAMIN D) 1000 UNITS tablet, Take 5,000 Units by mouth daily., Disp: , Rfl:    glucose blood test strip, 1 each by Other route as needed for other. Use as instructed to  check blood sugars, Disp: , Rfl:    Lancets (ONETOUCH DELICA PLUS LANCET33G) MISC, by Does not apply route. Use as directed to check blood  Sugars 2 times per day dx E11.65, Disp: , Rfl:    Multiple Vitamin (MULTIVITAMIN WITH MINERALS) TABS, Take 1 tablet by mouth daily., Disp: , Rfl:    NON FORMULARY, Take 1 tablet by mouth daily. Thyroid  action, Disp: , Rfl:    Olmesartan-amLODIPine-HCTZ 40-10-25 MG TABS, TAKE 1 TABLET BY MOUTH EVERY DAY, Disp: 90 tablet, Rfl: 1   Omega-3 Fatty Acids (FISH OIL PO), Take 1 tablet by mouth once., Disp: , Rfl:    pioglitazone-metformin (ACTOPLUS MET) 15-850 MG tablet, TAKE 1 TABLET BY MOUTH TWICE A DAY, Disp: 180 tablet, Rfl: 1   rosuvastatin (CRESTOR) 10 MG tablet, TAKE 1 TABLET BY MOUTH EVERY DAY, Disp: 90 tablet, Rfl: 1   spironolactone (ALDACTONE) 25 MG tablet, TAKE 1 TABLET BY MOUTH EVERY DAY (Patient not taking: Reported on 04/04/2022), Disp: 90 tablet, Rfl: 1   enzalutamide (XTANDI) 40 MG tablet, TAKE 4 TABLETS (160 MG TOTAL) BY MOUTH DAILY., Disp: 120 tablet, Rfl: 2   Semaglutide,0.25 or  0.5MG /DOS, (OZEMPIC, 0.25 OR 0.5 MG/DOSE,) 2 MG/3ML SOPN, Inject 0.25 mg weekly for 4 weeks, then increase to 0.5 mg weekly, Disp: 3 mL, Rfl: 2   No Known Allergies   Men's preventive visit. Patient Health Questionnaire (PHQ-2) is  Flowsheet Row Office Visit from 04/03/2022 in [facility]  PHQ-2 Total Score 0     Patient is on a regular diet; appetite has picked up.  Exercising with gardening. He is doing an addition to his outside sheds.  Marital status: Married. Relevant history for alcohol use is:  Social History   Substance and Sexual Activity  Alcohol Use Yes   Comment: occassionally   Relevant history for tobacco use is:  Social History   Tobacco Use  Smoking Status Some Days   Packs/day: 0.25   Years: 50.00   Additional pack years: 0.00   Total pack years: 12.50   Types: Cigarettes  Smokeless Tobacco Former   Types:  Chew   Quit date: 1975  Tobacco Comments   smoked about 1/2 PPD for about 10 years; now at 4-5 cigarettes per day   .   Review of Systems  Constitutional: Negative.   HENT: Negative.    Eyes: Negative.   Respiratory: Negative.    Cardiovascular: Negative.   Gastrointestinal: Negative.   Endocrine: Negative.   Genitourinary: Negative.   Musculoskeletal: Negative.   Allergic/Immunologic: Negative.   Neurological: Negative.   Hematological: Negative.   Psychiatric/Behavioral: Negative.       Today's Vitals   04/03/22 1412 04/03/22 1445  BP: (!) 142/82 (!) 142/82  Pulse: 72   SpO2: 98%   Weight: 196 lb (88.9 kg)   Height: 5\' 9"  (1.753 m)    Body mass index is 28.94 kg/m.   Objective:  Physical Exam Vitals reviewed.  Constitutional:      General: He is not in acute distress.    Appearance: Normal appearance.  HENT:     Head: Normocephalic and atraumatic.     Right Ear: Tympanic membrane, ear canal and external ear normal. There is no impacted cerumen.     Left Ear: Tympanic membrane, ear canal and external ear normal. There is no impacted cerumen.     Nose: Nose normal.     Mouth/Throat:     Mouth: Mucous membranes are moist.     Dentition: Has dentures (upper and lower).  Eyes:     Pupils: Pupils are equal, round, and reactive to light.  Cardiovascular:     Rate and Rhythm: Normal rate and regular rhythm.     Pulses:          Popliteal pulses are 1+ on the right side and 1+ on the left side.       Dorsalis pedis pulses are 1+ on the right side and 1+ on the left side.     Heart sounds: Normal heart sounds. No murmur heard. Pulmonary:     Effort: Pulmonary effort is normal. No respiratory distress.     Breath sounds: Normal breath sounds. No wheezing.  Abdominal:     General: Abdomen is flat. Bowel sounds are normal.     Palpations: Abdomen is soft.  Genitourinary:    Comments: Being followed by urology Musculoskeletal:        General: Swelling (left lower  extremity with hyperpigmented skin) present. No tenderness. Normal range of motion.     Cervical back: Normal range of motion and neck supple.  Skin:    General: Skin  is warm and dry.     Capillary Refill: Capillary refill takes less than 2 seconds.  Neurological:     General: No focal deficit present.     Mental Status: He is alert and oriented to person, place, and time.     Cranial Nerves: No cranial nerve deficit.     Motor: No weakness.  Psychiatric:        Mood and Affect: Mood normal.        Behavior: Behavior normal.        Thought Content: Thought content normal.        Judgment: Judgment normal.         Assessment And Plan:    1. Encounter for health maintenance examination Behavior modifications discussed and diet history reviewed.   Pt will continue to exercise regularly and modify diet with low GI, plant based foods and decrease intake of processed foods.  Recommend intake of daily multivitamin, Vitamin D, and calcium.  Recommend colonoscopy for preventive screenings, as well as recommend immunizations that include influenza, TDAP, and Shingles (declined)  2. Herpes zoster vaccination declined Declines shingrix, educated on disease process and is aware if he changes his mind to notify office  3. Encounter for immunization Comments: Pneumonia 20 given in office - Pneumococcal conjugate vaccine 20-valent (Prevnar 20)  4. Essential hypertension Comments: Blood pressure is fairly controlled.  He is advised to focus on lifestyle modifications. Continue current medications - EKG 12-Lead  5. Mixed hyperlipidemia Comments: Cholesterol levels are stable - Lipid panel  6. Type 2 diabetes mellitus with other skin complication, with long-term current use of insulin (HCC) Comments: Continue current medications. - Urine microalbumin-creatinine with uACR  7. Tobacco abuse Comments: Discussed risk of chewing tobacco.  8. Aortic atherosclerosis (HCC) Comments: Continue  statin, tolerating well.  9. Prostate cancer (HCC) Comments: Continue f/u with Oncology Dr. Ellin SabaKatragadda  10. Secondary malignant neoplasm of bone [facility](HCC)    Patient was given opportunity to ask questions. Patient verbalized understanding of the plan and was able to repeat key elements of the plan. All questions were answered to their satisfaction.   Arnette FeltsJanece Cornell Gaber, [name]   I, Arnette FeltsJanece Wilberth Damon, [name], have reviewed all documentation for this visit. The documentation on 04/03/22 for the exam, diagnosis, procedures, and orders are all accurate and complete.   THE PATIENT IS ENCOURAGED TO PRACTICE SOCIAL DISTANCING DUE TO THE COVID-19 PANDEMIC.

## 2022-04-04 ENCOUNTER — Other Ambulatory Visit: Payer: Self-pay | Admitting: Pharmacist

## 2022-04-04 LAB — LIPID PANEL
Chol/HDL Ratio: 1.8 ratio (ref 0.0–5.0)
Cholesterol, Total: 138 mg/dL (ref 100–199)
HDL: 78 mg/dL (ref >39)
LDL Chol Calc (NIH): 44 mg/dL (ref 0–99)
Triglycerides: 87 mg/dL (ref 0–149)
VLDL Cholesterol Cal: 16 mg/dL (ref 5–40)

## 2022-04-04 NOTE — Progress Notes (Signed)
04/04/2022 Name: Kyle George MRN: [id number] DOB: September 09, 1950  Chief Complaint  Patient presents with   Diabetes   Medication Management    Kyle George is a 72 y.o. year old male who presented for a telephone visit.   They were referred to the pharmacist by a quality report for assistance in managing upcoming discontinuation of Levemir.   Subjective:  Care Team: Primary Care Provider: [name], [name] ; Next Scheduled Visit: 08/23/22  Medication Access/Adherence  Current Pharmacy:  [id number] [place], [place] [address] Phone: [phone] Fax: [place] [address] Phone: [phone] Fax: [phone]   Patient reports affordability concerns with their medications: No  Patient reports access/transportation concerns to their pharmacy: No  Patient reports adherence concerns with their medications:  No  but through review today, noted some medications needing refills   Diabetes:  Current medications: pioglitazone/metformin 15/850 mg twice daily, Januvia 100 mg daily, Levemir 10 units daily   Patient denies hypoglycemic s/sx including dizziness, shakiness, sweating. Patient denies hyperglycemic symptoms including polyuria, polydipsia, polyphagia, nocturia, neuropathy, blurred vision.  Hypertension:  Current medications: olmesartan/amlodipine/HCTZ 40/10/25 mg daily, carvedilol 6.25 mg twice daily, spironolactone 25 mg daily - though fill history shows he has not filled this medication in several months  Patient has a wrist home BP cuff that he just purchased  Patient denies hypotensive s/sx including dizziness, lightheadedness.  Patient denies hypertensive symptoms including headache, chest pain, shortness of breath   Hyperlipidemia/ASCVD Risk Reduction  Current lipid lowering medications: rosuvastatin 10 mg daily  Antiplatelet  regimen: aspirin 81 mg daily   Objective:  Lab Results  Component Value Date   HGBA1C 5.7 (H) 09/08/2021    Lab Results  Component Value Date   CREATININE 0.84 02/07/2022   BUN 12 02/07/2022   NA 139 02/07/2022   K 4.0 02/07/2022   CL 101 02/07/2022   CO2 28 02/07/2022    Lab Results  Component Value Date   CHOL 138 04/03/2022   HDL 78 04/03/2022   LDLCALC 44 04/03/2022   TRIG 87 04/03/2022   CHOLHDL 1.8 04/03/2022    Medications Reviewed Today     Reviewed by Osker Mason, RPH-CPP (Pharmacist) on 04/04/22 at 1138  Med List Status: <None>   Medication Order Taking? Sig Documenting Provider Last Dose Status Informant  aspirin 81 MG tablet [id number] Yes Take 81 mg by mouth daily. [provider] Taking Active   B-D ULTRAFINE III SHORT PEN 31G X 8 MM MISC [id number]  USE AS DIRECTED WITH LEVEMIR PEN [name], [name]  Active   blood glucose meter kit and supplies KIT [id number] Yes Dispense based on patient and insurance preference. Use up to four times daily as directed. [name], [name] Taking Active   Blood Pressure Monitoring (BLOOD PRESSURE CUFF) MISC [id number] Yes by Does not apply route. Check blood pressure at least 4 times per week 3 hours after taking medication [provider] Taking Active   Calcium Carbonate-Vitamin D (CALCIUM 600+D PO) [id number] Yes Take 1 tablet by mouth daily. [provider] Taking Active   carvedilol (COREG) 6.25 MG tablet [id number] Yes TAKE 1 TABLET BY MOUTH TWICE A DAY WITH FOOD [name], [name] Taking Active   cholecalciferol (VITAMIN D) 1000 UNITS tablet [id number] Yes Take 5,000 Units by mouth daily. [provider] Taking Active   enzalutamide Gillermina Phy) 40  MG tablet [id number] Yes TAKE 4 TABLETS (160 MG TOTAL) BY MOUTH DAILY. Derek Jack, MD Taking Active   glucose blood test strip [id number]  1 each by Other route as needed for other. Use as instructed to check blood sugars [provider]  Active   insulin detemir (LEVEMIR FLEXPEN) 100 UNIT/ML FlexPen [id number] Yes INJECT 15 UNITS INTO THE SKIN DAILY. [name], [name] Taking Active            Med Note Jodi Mourning, Grace Bushy   Tue Apr 04, 2022 11:29 AM) 10 units daily  Insulin Syringe-Needle U-100 31G X 5/16" 0.3 ML MISC [id number]  by Does not apply route. Use as directed with levemir pen [provider]  Active   JANUVIA 100 MG tablet [id number] Yes TAKE 1 TABLET BY MOUTH EVERY DAY [name], [name] Taking Active   Lancets (ONETOUCH DELICA PLUS 123XX123) Romeo [id number]  by Does not apply route. Use as directed to check blood  Sugars 2 times per day dx E11.65 [provider]  Active   Multiple Vitamin (MULTIVITAMIN WITH MINERALS) TABS [id number] Yes Take 1 tablet by mouth daily. [provider] Taking Active Self  NON FORMULARY [id number]  Take 1 tablet by mouth daily. Thyroid  action [provider]  Active   Olmesartan-amLODIPine-HCTZ 40-10-25 MG TABS [id number] Yes TAKE 1 TABLET BY MOUTH EVERY DAY [name], [name] Taking Active   Omega-3 Fatty Acids (FISH OIL PO) [id number] Yes Take 1 tablet by mouth once. [provider] Taking Active   pioglitazone-metformin (ACTOPLUS MET) 15-850 MG tablet [id number] Yes TAKE 1 TABLET BY MOUTH TWICE A Nolene Ebbs, [name] Taking Active   rosuvastatin (CRESTOR) 10 MG tablet [id number] Yes TAKE 1 TABLET BY MOUTH EVERY DAY [name], [name] Taking Active   spironolactone (ALDACTONE) 25 MG tablet [id number] No TAKE 1 TABLET BY MOUTH EVERY DAY  Patient not taking: Reported on 04/04/2022   [name], [place] Not Taking Active   Med List Note Britt Boozer, [facility] 02/23/20 1039): Xtandi filled at [facility]              Assessment/Plan:   Diabetes: - Currently controlled but with opportunity for streamlined regimen - Reviewed long term cardiovascular and renal outcomes of uncontrolled blood sugar - Reviewed goal A1c, goal fasting, and  goal 2 hour post prandial glucose - Recommend to start GLP1, allow for discontinuation of Levemir and Januvia due to duplicative mechanism of action. Counseled on GLP1, including mechanism of action and side effects. Recommend to start Ozempic 0.25 mg weekly for 4 weeks then increase to 0.5 mg weekly. Will discuss with PCP.  - Recommend to check glucose periodically, fasting and 2 hour post prandial   Hypertension: - Currently uncontrolled - Reviewed long term cardiovascular and renal outcomes of uncontrolled blood pressure - Reviewed appropriate blood pressure monitoring technique and reviewed goal blood pressure. Recommended to check home blood pressure and heart rate daily - Recommend to send refill of spironolactone to pharmacy. Will discuss with PCP.   Hyperlipidemia/ASCVD Risk Reduction: - Currently controlled.  - Recommend to continue current regimen    Follow Up Plan: phone call in 6 weeks  Catie Hedwig Morton, PharmD, [place], [facility] [phone]

## 2022-04-05 LAB — MICROALBUMIN / CREATININE URINE RATIO
Creatinine, Urine: 88.6 mg/dL
Microalb/Creat Ratio: 356 mg/g creat — ABNORMAL HIGH (ref 0–29)
Microalbumin, Urine: 315.2 ug/mL

## 2022-04-06 ENCOUNTER — Other Ambulatory Visit (HOSPITAL_COMMUNITY): Payer: Self-pay

## 2022-04-06 ENCOUNTER — Other Ambulatory Visit: Payer: Self-pay | Admitting: Hematology

## 2022-04-06 ENCOUNTER — Other Ambulatory Visit: Payer: Self-pay

## 2022-04-06 DIAGNOSIS — C61 Malignant neoplasm of prostate: Secondary | ICD-10-CM

## 2022-04-06 MED ORDER — ENZALUTAMIDE 40 MG PO TABS
ORAL_TABLET | ORAL | 2 refills | Status: DC
  Filled 2022-04-06: qty 120, 30d supply, fill #0
  Filled 2022-05-03: qty 120, 30d supply, fill #1
  Filled 2022-06-01: qty 120, 30d supply, fill #2

## 2022-04-11 MED ORDER — OZEMPIC (0.25 OR 0.5 MG/DOSE) 2 MG/3ML ~~LOC~~ SOPN: PEN_INJECTOR | SUBCUTANEOUS | 2 refills | Status: DC

## 2022-04-11 NOTE — Progress Notes (Signed)
PCP in agreement with Ozempic start. Attempted PA via Cover My Meds; PA not required. [facility] [place]   [facility] [place]. Copay is $3.44.   Contacted patient. Left voicemail for him to return my call.   Catie Hedwig Morton, PharmD, [place], [facility] [phone]

## 2022-04-12 ENCOUNTER — Other Ambulatory Visit (HOSPITAL_COMMUNITY): Payer: Self-pay

## 2022-04-12 ENCOUNTER — Telehealth: Payer: Self-pay

## 2022-04-12 NOTE — Telephone Encounter (Signed)
[id number]  Per plan this medication is available without prior auth.   Pharmacy states patient has already picked up medication. Nothing further needed at this time.

## 2022-04-13 ENCOUNTER — Other Ambulatory Visit: Payer: Self-pay | Admitting: Nurse Practitioner

## 2022-04-17 ENCOUNTER — Other Ambulatory Visit: Payer: Self-pay | Admitting: Nurse Practitioner

## 2022-04-17 DIAGNOSIS — C61 Malignant neoplasm of prostate: Secondary | ICD-10-CM

## 2022-05-03 ENCOUNTER — Other Ambulatory Visit (HOSPITAL_COMMUNITY): Payer: Self-pay

## 2022-05-03 DIAGNOSIS — E119 Type 2 diabetes mellitus without complications: Secondary | ICD-10-CM | POA: Diagnosis not present

## 2022-05-03 DIAGNOSIS — B353 Tinea pedis: Secondary | ICD-10-CM | POA: Diagnosis not present

## 2022-05-03 DIAGNOSIS — L600 Ingrowing nail: Secondary | ICD-10-CM | POA: Diagnosis not present

## 2022-05-05 ENCOUNTER — Other Ambulatory Visit: Payer: Self-pay

## 2022-05-10 ENCOUNTER — Other Ambulatory Visit (HOSPITAL_COMMUNITY): Payer: Self-pay

## 2022-05-30 ENCOUNTER — Other Ambulatory Visit (HOSPITAL_COMMUNITY): Payer: Self-pay

## 2022-06-01 ENCOUNTER — Other Ambulatory Visit: Payer: Self-pay

## 2022-06-01 ENCOUNTER — Inpatient Hospital Stay: Payer: Medicare PPO | Attending: Nurse Practitioner

## 2022-06-01 DIAGNOSIS — Z5111 Encounter for antineoplastic chemotherapy: Secondary | ICD-10-CM | POA: Insufficient documentation

## 2022-06-01 DIAGNOSIS — C779 Secondary and unspecified malignant neoplasm of lymph node, unspecified: Secondary | ICD-10-CM | POA: Diagnosis not present

## 2022-06-01 DIAGNOSIS — F17210 Nicotine dependence, cigarettes, uncomplicated: Secondary | ICD-10-CM | POA: Diagnosis not present

## 2022-06-01 DIAGNOSIS — C61 Malignant neoplasm of prostate: Secondary | ICD-10-CM | POA: Insufficient documentation

## 2022-06-01 DIAGNOSIS — E559 Vitamin D deficiency, unspecified: Secondary | ICD-10-CM | POA: Insufficient documentation

## 2022-06-01 DIAGNOSIS — C7951 Secondary malignant neoplasm of bone: Secondary | ICD-10-CM | POA: Diagnosis not present

## 2022-06-01 DIAGNOSIS — Z809 Family history of malignant neoplasm, unspecified: Secondary | ICD-10-CM | POA: Diagnosis not present

## 2022-06-01 LAB — COMPREHENSIVE METABOLIC PANEL WITH GFR
ALT: 10 U/L (ref 0–44)
AST: 13 U/L — ABNORMAL LOW (ref 15–41)
Albumin: 3.4 g/dL — ABNORMAL LOW (ref 3.5–5.0)
Alkaline Phosphatase: 62 U/L (ref 38–126)
Anion gap: 10 (ref 5–15)
BUN: 19 mg/dL (ref 8–23)
CO2: 23 mmol/L (ref 22–32)
Calcium: 8.7 mg/dL — ABNORMAL LOW (ref 8.9–10.3)
Chloride: 102 mmol/L (ref 98–111)
Creatinine, Ser: 0.86 mg/dL (ref 0.61–1.24)
GFR, Estimated: >60 mL/min
Glucose, Bld: 99 mg/dL (ref 70–99)
Potassium: 3.7 mmol/L (ref 3.5–5.1)
Sodium: 135 mmol/L (ref 135–145)
Total Bilirubin: 0.5 mg/dL (ref 0.3–1.2)
Total Protein: 6.9 g/dL (ref 6.5–8.1)

## 2022-06-01 LAB — CBC WITH DIFFERENTIAL/PLATELET
Abs Immature Granulocytes: 0.02 K/uL (ref 0.00–0.07)
Basophils Absolute: 0 K/uL (ref 0.0–0.1)
Basophils Relative: 1 %
Eosinophils Absolute: 0.3 K/uL (ref 0.0–0.5)
Eosinophils Relative: 4 %
HCT: 43.1 % (ref 39.0–52.0)
Hemoglobin: 14.5 g/dL (ref 13.0–17.0)
Immature Granulocytes: 0 %
Lymphocytes Relative: 29 %
Lymphs Abs: 1.9 K/uL (ref 0.7–4.0)
MCH: 34.4 pg — ABNORMAL HIGH (ref 26.0–34.0)
MCHC: 33.6 g/dL (ref 30.0–36.0)
MCV: 102.1 fL — ABNORMAL HIGH (ref 80.0–100.0)
Monocytes Absolute: 0.4 K/uL (ref 0.1–1.0)
Monocytes Relative: 6 %
Neutro Abs: 4 K/uL (ref 1.7–7.7)
Neutrophils Relative %: 60 %
Platelets: 156 K/uL (ref 150–400)
RBC: 4.22 MIL/uL (ref 4.22–5.81)
RDW: 14.2 % (ref 11.5–15.5)
WBC: 6.6 K/uL (ref 4.0–10.5)
nRBC: 0 % (ref 0.0–0.2)

## 2022-06-01 LAB — VITAMIN D 25 HYDROXY (VIT D DEFICIENCY, FRACTURES): Vit D, 25-Hydroxy: 49.14 ng/mL (ref 30–100)

## 2022-06-01 LAB — PSA: Prostatic Specific Antigen: <0.01 ng/mL (ref 0.00–4.00)

## 2022-06-04 ENCOUNTER — Other Ambulatory Visit: Payer: Self-pay | Admitting: Nurse Practitioner

## 2022-06-07 ENCOUNTER — Other Ambulatory Visit (HOSPITAL_COMMUNITY): Payer: Self-pay

## 2022-06-07 NOTE — Progress Notes (Signed)
[facility] [address], [place] [address]    Clinic Day:  06/08/2022  Referring physician: Arnette Felts, [name]  Patient Care Team: Arnette Felts, [name] as PCP - General (General Practice) Doreatha Massed, MD as Medical Oncologist (Medical Oncology)   ASSESSMENT & PLAN:   Assessment: 1.  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 - CT CAP (03/24/2020): Mildly improved retroperitoneal and left pelvic sidewall adenopathy but with new and substantially increased sclerosis metastatic lesions.  Upper normal partially calcified subcarinal lymph nodes. - Xtandi 160 mg daily started on 03/01/2020.   2.  Social/family history: - Lives at home with his wife.  Independent of ADLs and IADLs.  Does gardening.  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 - Current active smoker, 3 to 4 cigarettes/day.  Smoked 1 pack/day for more than 30 years. - Father and paternal uncle had cancers, type not known to the patient.    Plan: 1.  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  He has some decreased appetite since enzalutamide was started.  He is not losing significant weight. - Reviewed labs from 06/01/2022: Normal LFTs.  Creatinine normal.  CBC grossly normal. - PSA is<0.01. - Continue enzalutamide 160 mg daily.  He will receive Eligard 30 mg injection today. - Recommend RTC 4 months with repeat labs and PSA.   2.  Metastatic bone disease: - We talked about denosumab/zoledronic  acid to decrease SRE. - He is agreeable.  We discussed side effects including hypocalcemia and rare chance of ONJ.  He does not have any dentition. - We will get approval from his insurance.  Vitamin D level is 49.  Continue vitamin D supplements daily.  Orders Placed This Encounter  Procedures   CBC with Differential/Platelet    Standing Status:   Future    Standing Expiration Date:   06/08/2023    Order Specific Question:   Release to patient    Answer:   Immediate   Comprehensive metabolic panel    Standing Status:   Future    Standing Expiration Date:   06/08/2023    Order Specific Question:   Release to patient    Answer:   Immediate   PSA    Standing Status:   Future    Standing Expiration Date:   06/08/2023   Testosterone    Standing Status:   Future    Standing Expiration Date:   06/08/2023      I,Katie Daubenspeck,acting as a scribe for Doreatha Massed, MD.,have documented all relevant documentation on the behalf of Doreatha Massed, MD,as directed by  Doreatha Massed, MD while in the presence of Doreatha Massed, MD.   I, Doreatha Massed MD, have reviewed the above documentation for accuracy and completeness, and I agree with the above.   Doreatha Massed, MD   5/30/20244:49 PM  CHIEF COMPLAINT:   Diagnosis: metastatic prostate cancer   Cancer Staging  Secondary malignant neoplasm of bone [facility]) Staging form: Bone - Appendicular Skeleton, Trunk, Skull, and Facial Bones, AJCC  8th Edition - Clinical: Stage IVB (cTX, cNX, pM1b) - Signed by Benjiman Core, MD on 04/29/2020    Prior Therapy: 1. ADT Deborra Medina, Lupron) 11/2011 - 12/2013 2. abiraterone 08/2016 - 01/2020  Current Therapy:  [name]; ADT (Eligard)   HISTORY OF PRESENT ILLNESS:   Oncology History  Secondary malignant neoplasm of bone (HCC)  11/25/2019 Initial Diagnosis   Secondary malignant neoplasm of bone (HCC)   04/29/2020 Cancer Staging   Staging form: Bone - Appendicular  Skeleton, Trunk, Skull, and Facial Bones, AJCC 8th Edition - Clinical: Stage IVB (cTX, cNX, pM1b) - Signed by Benjiman Core, MD on 04/29/2020      INTERVAL HISTORY:   Kyle George is a 72 y.o. male presenting to clinic today for follow up of metastatic prostate cancer. He was last seen by me on 02/07/22.  Today, he states that he is doing well overall. His appetite level is at 70%. His energy level is at 80%.  PAST MEDICAL HISTORY:   Past Medical History: Past Medical History:  Diagnosis Date   Cancer of prostate (HCC)    Diabetes mellitus without complication (HCC)    Hyperlipidemia    Hypertension     Surgical History: Past Surgical History:  Procedure Laterality Date   COLONOSCOPY     HEMORRHOID SURGERY      Social History: Social History   Socioeconomic History   Marital status: Married    Spouse name: Not on file   Number of children: Not on file   Years of education: Not on file   Highest education level: Not on file  Occupational History   Not on file  Tobacco Use   Smoking status: Some Days    Packs/day: 0.25    Years: 50.00    Additional pack years: 0.00    Total pack years: 12.50    Types: Cigarettes   Smokeless tobacco: Former    Types: Chew    Quit date: 1975   Tobacco comments:    smoked about 1/2 PPD for about 10 years; now at 4-5 cigarettes per day   Vaping Use   Vaping Use: Never used  Substance and Sexual Activity   Alcohol use: Yes    Comment: occassionally   Drug use: Never   Sexual activity: Yes  Other Topics Concern   Not on file  Social History Narrative   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   Social Determinants of Health   Financial Resource Strain: Low Risk  (08/10/2021)   Overall Financial Resource Strain (CARDIA)    Difficulty of Paying Living Expenses: Not hard at all  Food Insecurity: No Food Insecurity (02/07/2022)   Hunger Vital Sign    Worried About Running Out of Food in the Last Year: Never true     Ran Out of Food in the Last Year: Never true  Transportation Needs: No Transportation Needs (02/07/2022)   PRAPARE - Administrator, Civil Service (Medical): No    Lack of Transportation (Non-Medical): No  Physical Activity: Inactive (08/10/2021)   Exercise Vital Sign    Days of Exercise per Week: 0 days    Minutes of Exercise per Session: 0 min  Stress: No Stress Concern Present (08/10/2021)   [employer] of Occupational Health - Occupational Stress Questionnaire    Feeling of Stress : Not at all  Social Connections: Not on file  Intimate Partner Violence: Not At Risk (02/07/2022)   Humiliation, Afraid, Rape, and Kick  questionnaire    Fear of Current or Ex-Partner: No    Emotionally Abused: No    Physically Abused: No    Sexually Abused: No    Family History: Family History  Problem Relation Age of Onset   Hypertension Mother    Diabetes Mother    Cancer Father        He is unsure of what type of cancer but it was not prostate cancer   Cancer Paternal Uncle        Pt is unsure of what type of cancer   Colon cancer Neg Hx    Esophageal cancer Neg Hx    Stomach cancer Neg Hx    Rectal cancer Neg Hx     Current Medications:  Current Outpatient Medications:    aspirin 81 MG tablet, Take 81 mg by mouth daily., Disp: , Rfl:    blood glucose meter kit and supplies KIT, Dispense based on patient and insurance preference. Use up to four times daily as directed., Disp: 1 each, Rfl: 0   Blood Pressure Monitoring (BLOOD PRESSURE CUFF) MISC, by Does not apply route. Check blood pressure at least 4 times per week 3 hours after taking medication, Disp: , Rfl:    Calcium Carbonate-Vitamin D (CALCIUM 600+D PO), Take 1 tablet by mouth daily., Disp: , Rfl:    carvedilol (COREG) 6.25 MG tablet, TAKE 1 TABLET BY MOUTH TWICE A DAY WITH FOOD, Disp: 180 tablet, Rfl: 1   cholecalciferol (VITAMIN D) 1000 UNITS tablet, Take 5,000 Units by mouth daily., Disp: , Rfl:    enzalutamide  (XTANDI) 40 MG tablet, TAKE 4 TABLETS (160 MG TOTAL) BY MOUTH DAILY., Disp: 120 tablet, Rfl: 2   glucose blood test strip, 1 each by Other route as needed for other. Use as instructed to check blood sugars, Disp: , Rfl:    Lancets (ONETOUCH DELICA PLUS LANCET33G) MISC, by Does not apply route. Use as directed to check blood  Sugars 2 times per day dx E11.65, Disp: , Rfl:    Multiple Vitamin (MULTIVITAMIN WITH MINERALS) TABS, Take 1 tablet by mouth daily., Disp: , Rfl:    NON FORMULARY, Take 1 tablet by mouth daily. Thyroid  action, Disp: , Rfl:    Olmesartan-amLODIPine-HCTZ 40-10-25 MG TABS, TAKE 1 TABLET BY MOUTH EVERY DAY, Disp: 90 tablet, Rfl: 1   Omega-3 Fatty Acids (FISH OIL PO), Take 1 tablet by mouth once., Disp: , Rfl:    pioglitazone-metformin (ACTOPLUS MET) 15-850 MG tablet, TAKE 1 TABLET BY MOUTH TWICE A DAY, Disp: 180 tablet, Rfl: 1   rosuvastatin (CRESTOR) 10 MG tablet, TAKE 1 TABLET BY MOUTH EVERY DAY, Disp: 90 tablet, Rfl: 1   Semaglutide,0.25 or 0.5MG /DOS, (OZEMPIC, 0.25 OR 0.5 MG/DOSE,) 2 MG/3ML SOPN, Inject 0.25 mg weekly for 4 weeks, then increase to 0.5 mg weekly, Disp: 3 mL, Rfl: 2   spironolactone (ALDACTONE) 25 MG tablet, TAKE 1 TABLET BY MOUTH EVERY DAY, Disp: 90 tablet, Rfl: 1   Allergies: No Known Allergies  REVIEW OF SYSTEMS:   Review of Systems  Constitutional:  Positive for appetite change. Negative for chills, fatigue and fever.  HENT:   Negative for lump/mass, mouth sores, nosebleeds, sore throat and trouble swallowing.   Eyes:  Negative for eye problems.  Respiratory:  Negative for cough and shortness of breath.   Cardiovascular:  Negative for chest pain, leg swelling and palpitations.  Gastrointestinal:  Negative for abdominal pain, constipation, diarrhea, nausea and vomiting.  Genitourinary:  Negative for bladder  incontinence, difficulty urinating, dysuria, frequency, hematuria and nocturia.   Musculoskeletal:  Negative for arthralgias, back pain, flank  pain, myalgias and neck pain.  Skin:  Negative for itching and rash.  Neurological:  Negative for dizziness, headaches and numbness.  Hematological:  Does not bruise/bleed easily.  Psychiatric/Behavioral:  Negative for depression, sleep disturbance and suicidal ideas. The patient is not nervous/anxious.   All other systems reviewed and are negative.    VITALS:   Blood pressure 139/78, pulse 67, temperature 97.6 F (36.4 C), temperature source Oral, resp. rate 18, height 5\' 9"  (1.753 m), weight 195 lb 3.2 oz (88.5 kg), SpO2 94 %.  Wt Readings from Last 3 Encounters:  06/08/22 195 lb 3.2 oz (88.5 kg)  04/03/22 196 lb (88.9 kg)  02/07/22 195 lb 14.4 oz (88.9 kg)    Body mass index is 28.83 kg/m.  Performance status (ECOG): 1 - Symptomatic but completely ambulatory  PHYSICAL EXAM:   Physical Exam Vitals and nursing note reviewed. Exam conducted with a chaperone present.  Constitutional:      Appearance: Normal appearance.  Cardiovascular:     Rate and Rhythm: Normal rate and regular rhythm.     Pulses: Normal pulses.     Heart sounds: Normal heart sounds.  Pulmonary:     Effort: Pulmonary effort is normal.     Breath sounds: Normal breath sounds.  Abdominal:     Palpations: Abdomen is soft. There is no hepatomegaly, splenomegaly or mass.     Tenderness: There is no abdominal tenderness.  Musculoskeletal:     Right lower leg: No edema.     Left lower leg: No edema.  Lymphadenopathy:     Cervical: No cervical adenopathy.     Right cervical: No superficial, deep or posterior cervical adenopathy.    Left cervical: No superficial, deep or posterior cervical adenopathy.     Upper Body:     Right upper body: No supraclavicular or axillary adenopathy.     Left upper body: No supraclavicular or axillary adenopathy.  Neurological:     General: No focal deficit present.     Mental Status: He is alert and oriented to person, place, and time.  Psychiatric:        Mood and Affect:  Mood normal.        Behavior: Behavior normal.     LABS:      Latest Ref Rng & Units 06/01/2022    1:04 PM 02/07/2022   12:24 PM 09/02/2021    1:03 PM  CBC  WBC 4.0 - 10.5 K/uL 6.6  7.0  6.4   Hemoglobin 13.0 - 17.0 g/dL 13.0  86.5  78.4   Hematocrit 39.0 - 52.0 % 43.1  45.4  38.9   Platelets 150 - 400 K/uL 156  164  167       Latest Ref Rng & Units 06/01/2022    1:04 PM 02/07/2022   12:24 PM 09/02/2021    1:03 PM  CMP  Glucose 70 - 99 mg/dL 99  80  74   BUN 8 - 23 mg/dL 19  12  25    Creatinine 0.61 - 1.24 mg/dL 6.96  2.95  2.84   Sodium 135 - 145 mmol/L 135  139  142   Potassium 3.5 - 5.1 mmol/L 3.7  4.0  3.8   Chloride 98 - 111 mmol/L 102  101  108   CO2 22 - 32 mmol/L 23  28  29    Calcium 8.9 - 10.3  mg/dL 8.7  9.1  9.4   Total Protein 6.5 - 8.1 g/dL 6.9  7.5  7.0   Total Bilirubin 0.3 - 1.2 mg/dL 0.5  0.6  0.4   Alkaline Phos 38 - 126 U/L 62  69  67   AST 15 - 41 U/L 13  15  10    ALT 0 - 44 U/L 10  10  5       No results found for: "CEA1", "CEA" / No results found for: "CEA1", "CEA" Lab Results  Component Value Date   PSA1 <0.1 09/02/2021   No results found for: "HYQ657" No results found for: "CAN125"  No results found for: "TOTALPROTELP", "ALBUMINELP", "A1GS", "A2GS", "BETS", "BETA2SER", "GAMS", "MSPIKE", "SPEI" No results found for: "TIBC", "FERRITIN", "IRONPCTSAT" No results found for: "LDH"   STUDIES:   No results found.

## 2022-06-08 ENCOUNTER — Inpatient Hospital Stay: Payer: Medicare PPO | Admitting: Hematology

## 2022-06-08 ENCOUNTER — Inpatient Hospital Stay: Payer: Medicare PPO

## 2022-06-08 VITALS — BP 139/78 | HR 67 | Temp 97.6°F | Resp 18 | Ht 69.0 in | Wt 195.2 lb

## 2022-06-08 DIAGNOSIS — Z5111 Encounter for antineoplastic chemotherapy: Secondary | ICD-10-CM | POA: Diagnosis not present

## 2022-06-08 DIAGNOSIS — F17210 Nicotine dependence, cigarettes, uncomplicated: Secondary | ICD-10-CM | POA: Diagnosis not present

## 2022-06-08 DIAGNOSIS — C7951 Secondary malignant neoplasm of bone: Secondary | ICD-10-CM | POA: Diagnosis not present

## 2022-06-08 DIAGNOSIS — Z809 Family history of malignant neoplasm, unspecified: Secondary | ICD-10-CM | POA: Diagnosis not present

## 2022-06-08 DIAGNOSIS — C61 Malignant neoplasm of prostate: Secondary | ICD-10-CM

## 2022-06-08 DIAGNOSIS — E559 Vitamin D deficiency, unspecified: Secondary | ICD-10-CM | POA: Diagnosis not present

## 2022-06-08 DIAGNOSIS — C779 Secondary and unspecified malignant neoplasm of lymph node, unspecified: Secondary | ICD-10-CM | POA: Diagnosis not present

## 2022-06-08 MED ORDER — LEUPROLIDE ACETATE (4 MONTH) 30 MG ~~LOC~~ KIT
30.0000 mg | PACK | Freq: Once | SUBCUTANEOUS | Status: AC
  Administered 2022-06-08: 30 mg via SUBCUTANEOUS
  Filled 2022-06-08: qty 30

## 2022-06-08 NOTE — Patient Instructions (Addendum)
[facility]  Discharge Instructions  You were seen and examined today by Dr. Ellin Saba.  Dr. Ellin Saba discussed your most recent lab work which revealed that everything looks good and stable.  Continue Xstandi as prescribed. Start taking Calcium once daily.   Follow-up as scheduled in 4 months.    Thank you for choosing [facility] to provide your oncology and hematology care.   To afford each patient quality time with our provider, please arrive at least 15 minutes before your scheduled appointment time. You may need to reschedule your appointment if you arrive late (10 or more minutes). Arriving late affects you and other patients whose appointments are after yours.  Also, if you miss three or more appointments without notifying the office, you may be dismissed from the clinic at the provider's discretion.    Again, thank you for choosing [facility].  Our hope is that these requests will decrease the amount of time that you wait before being seen by our physicians.   If you have a lab appointment with the Cancer Center - please note that after April 8th, all labs will be drawn in the cancer center.  You do not have to check in or register with the main entrance as you have in the past but will complete your check-in at the cancer center.            _____________________________________________________________  Should you have questions after your visit to [facility] please contact our office at [phone] and follow the prompts.  Our office hours are 8:00 a.m. to 4:30 p.m. Monday - Thursday and 8:00 a.m. to 2:30 p.m. Friday.  Please note that voicemails left after 4:00 p.m. may not be returned until the following business day.  We are closed weekends and all major holidays.  You do have access to a nurse 24-7, just call the main number to the clinic [phone] and do not press any options, hold on  the line and a nurse will answer the phone.    For prescription refill requests, have your pharmacy contact our office and allow 72 hours.    Masks are no longer required in the cancer centers. If you would like for your care team to wear a mask while they are taking care of you, please let them know. You may have one support person who is at least 73 years old accompany you for your appointments.

## 2022-06-08 NOTE — Progress Notes (Signed)
Patient tolerated Eligard injection with no complaints voiced. Site clean and dry with no bruising or swelling noted at site. See MAR for details. Band aid applied.  Patient stable during and after injection. VSS with discharge and left in satisfactory condition with no s/s of distress noted.

## 2022-06-08 NOTE — Patient Instructions (Signed)
[facility]  Discharge Instructions: Thank you for choosing [facility] to provide your oncology and hematology care.  If you have a lab appointment with the Cancer Center - please note that after April 8th, 2024, all labs will be drawn in the cancer center.  You do not have to check in or register with the main entrance as you have in the past but will complete your check-in in the cancer center.  Wear comfortable clothing and clothing appropriate for easy access to any Portacath or PICC line.   We strive to give you quality time with your provider. You may need to reschedule your appointment if you arrive late (15 or more minutes).  Arriving late affects you and other patients whose appointments are after yours.  Also, if you miss three or more appointments without notifying the office, you may be dismissed from the clinic at the provider's discretion.      For prescription refill requests, have your pharmacy contact our office and allow 72 hours for refills to be completed.    Today you received the following Eligard, return as scheduled.   To help prevent nausea and vomiting after your treatment, we encourage you to take your nausea medication as directed.  BELOW ARE SYMPTOMS THAT SHOULD BE REPORTED IMMEDIATELY: *FEVER GREATER THAN 100.4 F (38 C) OR HIGHER *CHILLS OR SWEATING *NAUSEA AND VOMITING THAT IS NOT CONTROLLED WITH YOUR NAUSEA MEDICATION *UNUSUAL SHORTNESS OF BREATH *UNUSUAL BRUISING OR BLEEDING *URINARY PROBLEMS (pain or burning when urinating, or frequent urination) *BOWEL PROBLEMS (unusual diarrhea, constipation, pain near the anus) TENDERNESS IN MOUTH AND THROAT WITH OR WITHOUT PRESENCE OF ULCERS (sore throat, sores in mouth, or a toothache) UNUSUAL RASH, SWELLING OR PAIN  UNUSUAL VAGINAL DISCHARGE OR ITCHING   Items with * indicate a potential emergency and should be followed up as soon as possible or go to the Emergency Department if  any problems should occur.  Please show the CHEMOTHERAPY ALERT CARD or IMMUNOTHERAPY ALERT CARD at check-in to the Emergency Department and triage nurse.  Should you have questions after your visit or need to cancel or reschedule your appointment, please contact [facility] [phone]  and follow the prompts.  Office hours are 8:00 a.m. to 4:30 p.m. Monday - Friday. Please note that voicemails left after 4:00 p.m. may not be returned until the following business day.  We are closed weekends and major holidays. You have access to a nurse at all times for urgent questions. Please call the main number to the clinic [phone] and follow the prompts.  For any non-urgent questions, you may also contact your provider using MyChart. We now offer e-Visits for anyone 8 and older to request care online for non-urgent symptoms. For details visit [email or web address].   Also download the MyChart app! Go to the app store, search "MyChart", open the app, select [facility], and log in with your MyChart username and password.

## 2022-06-12 ENCOUNTER — Other Ambulatory Visit: Payer: Self-pay

## 2022-06-12 DIAGNOSIS — C61 Malignant neoplasm of prostate: Secondary | ICD-10-CM

## 2022-06-13 ENCOUNTER — Inpatient Hospital Stay: Payer: Medicare PPO | Attending: Nurse Practitioner

## 2022-06-13 ENCOUNTER — Inpatient Hospital Stay: Payer: Medicare PPO

## 2022-06-27 ENCOUNTER — Other Ambulatory Visit (HOSPITAL_COMMUNITY): Payer: Self-pay

## 2022-06-27 ENCOUNTER — Other Ambulatory Visit: Payer: Self-pay | Admitting: Hematology

## 2022-06-27 ENCOUNTER — Other Ambulatory Visit: Payer: Self-pay

## 2022-06-27 DIAGNOSIS — C61 Malignant neoplasm of prostate: Secondary | ICD-10-CM

## 2022-06-27 MED ORDER — ENZALUTAMIDE 40 MG PO TABS
ORAL_TABLET | ORAL | 2 refills | Status: AC
Start: 2022-06-27 — End: 2023-06-27
  Filled 2022-06-27: qty 120, 30d supply, fill #0
  Filled 2022-08-02: qty 120, 30d supply, fill #1
  Filled 2022-09-05: qty 120, 30d supply, fill #2

## 2022-06-29 ENCOUNTER — Encounter: Payer: Self-pay | Admitting: Gastroenterology

## 2022-07-03 ENCOUNTER — Other Ambulatory Visit: Payer: Self-pay | Admitting: Nurse Practitioner

## 2022-07-10 ENCOUNTER — Other Ambulatory Visit: Payer: Self-pay | Admitting: Nurse Practitioner

## 2022-07-10 DIAGNOSIS — E782 Mixed hyperlipidemia: Secondary | ICD-10-CM

## 2022-07-11 ENCOUNTER — Inpatient Hospital Stay: Payer: Medicare PPO

## 2022-07-11 ENCOUNTER — Inpatient Hospital Stay: Payer: Medicare PPO | Attending: Nurse Practitioner

## 2022-07-14 ENCOUNTER — Other Ambulatory Visit: Payer: Self-pay

## 2022-07-21 ENCOUNTER — Other Ambulatory Visit: Payer: Self-pay | Admitting: Nurse Practitioner

## 2022-07-21 DIAGNOSIS — E119 Type 2 diabetes mellitus without complications: Secondary | ICD-10-CM

## 2022-08-02 ENCOUNTER — Other Ambulatory Visit (HOSPITAL_COMMUNITY): Payer: Self-pay

## 2022-08-02 ENCOUNTER — Other Ambulatory Visit: Payer: Self-pay | Admitting: Nurse Practitioner

## 2022-08-02 DIAGNOSIS — C61 Malignant neoplasm of prostate: Secondary | ICD-10-CM

## 2022-08-07 ENCOUNTER — Ambulatory Visit: Payer: Medicare PPO | Admitting: Nurse Practitioner

## 2022-08-08 ENCOUNTER — Other Ambulatory Visit: Payer: Self-pay

## 2022-08-23 ENCOUNTER — Ambulatory Visit: Payer: Medicare PPO | Admitting: Nurse Practitioner

## 2022-08-23 ENCOUNTER — Ambulatory Visit (INDEPENDENT_AMBULATORY_CARE_PROVIDER_SITE_OTHER): Payer: Medicare PPO

## 2022-08-23 ENCOUNTER — Encounter: Payer: Self-pay | Admitting: Nurse Practitioner

## 2022-08-23 VITALS — BP 130/72 | HR 71 | Temp 98.2°F | Ht 67.2 in | Wt 178.0 lb

## 2022-08-23 DIAGNOSIS — I10 Essential (primary) hypertension: Secondary | ICD-10-CM | POA: Diagnosis not present

## 2022-08-23 DIAGNOSIS — E11628 Type 2 diabetes mellitus with other skin complications: Secondary | ICD-10-CM

## 2022-08-23 DIAGNOSIS — C778 Secondary and unspecified malignant neoplasm of lymph nodes of multiple regions: Secondary | ICD-10-CM

## 2022-08-23 DIAGNOSIS — E782 Mixed hyperlipidemia: Secondary | ICD-10-CM | POA: Diagnosis not present

## 2022-08-23 DIAGNOSIS — Z794 Long term (current) use of insulin: Secondary | ICD-10-CM

## 2022-08-23 DIAGNOSIS — I700 Atherosclerosis of aorta: Secondary | ICD-10-CM

## 2022-08-23 DIAGNOSIS — C61 Malignant neoplasm of prostate: Secondary | ICD-10-CM

## 2022-08-23 DIAGNOSIS — C7951 Secondary malignant neoplasm of bone: Secondary | ICD-10-CM

## 2022-08-23 DIAGNOSIS — Z0000 Encounter for general adult medical examination without abnormal findings: Secondary | ICD-10-CM | POA: Diagnosis not present

## 2022-08-23 MED ORDER — SPIRONOLACTONE 25 MG PO TABS: 25.0000 mg | ORAL_TABLET | Freq: Every day | ORAL | 1 refills | Status: DC

## 2022-08-23 NOTE — Progress Notes (Signed)
Subjective:   Kyle George is a 72 y.o. male who presents for Medicare Annual/Subsequent preventive examination.  Visit Complete: In person    Review of Systems     Cardiac Risk Factors include: advanced age (>29men, >38 women);diabetes mellitus;dyslipidemia;hypertension;male gender     Objective:    Today's Vitals   08/23/22 1116  BP: 130/72  Pulse: 71  Temp: 98.2 F (36.8 C)  TempSrc: Oral  SpO2: 96%  Weight: 178 lb (80.7 kg)  Height: 5' 7.2" (1.707 m)   Body mass index is 27.71 kg/m.     08/23/2022   11:24 AM 06/08/2022    3:11 PM 02/07/2022    1:19 PM 08/10/2021   12:22 PM 05/27/2020    4:19 PM 10/17/2019   12:06 PM 06/18/2019    9:34 AM  Advanced Directives  Does Patient Have a Medical Advance Directive? No No No No No No No  Would patient like information on creating a medical advance directive? No - Patient declined No - Patient declined Yes (MAU/Ambulatory/Procedural Areas - Information given)   No - Patient declined     Current Medications (verified) Outpatient Encounter Medications as of 08/23/2022  Medication Sig   aspirin 81 MG tablet Take 81 mg by mouth daily.   blood glucose meter kit and supplies KIT Dispense based on patient and insurance preference. Use up to four times daily as directed.   Blood Pressure Monitoring (BLOOD PRESSURE CUFF) MISC by Does not apply route. Check blood pressure at least 4 times per week 3 hours after taking medication   carvedilol (COREG) 6.25 MG tablet TAKE 1 TABLET BY MOUTH TWICE A DAY WITH FOOD   cholecalciferol (VITAMIN D) 1000 UNITS tablet Take 5,000 Units by mouth daily.   enzalutamide (XTANDI) 40 MG tablet TAKE 4 TABLETS (160 MG TOTAL) BY MOUTH DAILY.   glucose blood test strip 1 each by Other route as needed for other. Use as instructed to check blood sugars   Lancets (ONETOUCH DELICA PLUS LANCET33G) MISC by Does not apply route. Use as directed to check blood  Sugars 2 times per day dx E11.65   Multiple Vitamin  (MULTIVITAMIN WITH MINERALS) TABS Take 1 tablet by mouth daily.   Olmesartan-amLODIPine-HCTZ 40-10-25 MG TABS TAKE 1 TABLET BY MOUTH EVERY DAY   Omega-3 Fatty Acids (FISH OIL PO) Take 1 tablet by mouth once.   pioglitazone-metformin (ACTOPLUS MET) 15-850 MG tablet TAKE 1 TABLET BY MOUTH TWICE A DAY   rosuvastatin (CRESTOR) 10 MG tablet TAKE 1 TABLET BY MOUTH EVERY DAY   Semaglutide,0.25 or 0.5MG /DOS, (OZEMPIC, 0.25 OR 0.5 MG/DOSE,) 2 MG/3ML SOPN INJECT 0.25 MG WEEKLY FOR 4 WEEKS, THEN INCREASE TO 0.5 MG WEEKLY   spironolactone (ALDACTONE) 25 MG tablet TAKE 1 TABLET BY MOUTH EVERY DAY   Calcium Carbonate-Vitamin D (CALCIUM 600+D PO) Take 1 tablet by mouth daily. (Patient not taking: Reported on 08/23/2022)   NON FORMULARY Take 1 tablet by mouth daily. Thyroid  action (Patient not taking: Reported on 08/23/2022)   No facility-administered encounter medications on file as of 08/23/2022.    Allergies (verified) Patient has no known allergies.   History: Past Medical History:  Diagnosis Date   Cancer of prostate (HCC)    Diabetes mellitus without complication (HCC)    Hyperlipidemia    Hypertension    Past Surgical History:  Procedure Laterality Date   COLONOSCOPY     HEMORRHOID SURGERY     Family History  Problem Relation Age of Onset  Hypertension Mother    Diabetes Mother    Cancer Father        He is unsure of what type of cancer but it was not prostate cancer   Cancer Paternal Uncle        Pt is unsure of what type of cancer   Colon cancer Neg Hx    Esophageal cancer Neg Hx    Stomach cancer Neg Hx    Rectal cancer Neg Hx    Social History   Socioeconomic History   Marital status: Married    Spouse name: Not on file   Number of children: Not on file   Years of education: Not on file   Highest education level: Not on file  Occupational History   Not on file  Tobacco Use   Smoking status: Some Days    Current packs/day: 0.25    Average packs/day: 0.3 packs/day for  50.0 years (12.5 ttl pk-yrs)    Types: Cigarettes   Smokeless tobacco: Former    Types: Chew    Quit date: 1975   Tobacco comments:    smoked about 1/2 PPD for about 10 years; now at 4-5 cigarettes per day   Vaping Use   Vaping status: Never Used  Substance and Sexual Activity   Alcohol use: Yes    Comment: occassionally   Drug use: Never   Sexual activity: Yes  Other Topics Concern   Not on file  Social History Narrative   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   Social Determinants of Health   Financial Resource Strain: Low Risk  (08/23/2022)   Overall Financial Resource Strain (CARDIA)    Difficulty of Paying Living Expenses: Not hard at all  Food Insecurity: No Food Insecurity (08/23/2022)   Hunger Vital Sign    Worried About Running Out of Food in the Last Year: Never true    Ran Out of Food in the Last Year: Never true  Transportation Needs: No Transportation Needs (08/23/2022)   PRAPARE - Administrator, Civil Service (Medical): No    Lack of Transportation (Non-Medical): No  Physical Activity: Sufficiently Active (08/23/2022)   Exercise Vital Sign    Days of Exercise per Week: 6 days    Minutes of Exercise per Session: 90 min  Stress: No Stress Concern Present (08/23/2022)   [employer] of Occupational Health - Occupational Stress Questionnaire    Feeling of Stress : Not at all  Social Connections: Moderately Integrated (08/23/2022)   Social Connection and Isolation Panel [NHANES]    Frequency of Communication with Friends and Family: More than three times a week    Frequency of Social Gatherings with Friends and Family: Once a week    Attends Religious Services: More than 4 times per year    Active Member of [employer] or Organizations: No    Attends Engineer, structural: Never    Marital Status: Married    Tobacco Counseling Ready to quit: Not Answered Counseling given: Not Answered Tobacco comments: smoked about  1/2 PPD for about 10 years; now at 4-5 cigarettes per day    Clinical Intake:  Pre-visit preparation completed: Yes  Pain : No/denies pain     Nutritional Status: BMI 25 -29 Overweight Nutritional Risks: None Diabetes: Yes CBG done?: No Did pt. bring in CBG monitor from home?: No  How often do you need to have someone help you when you read instructions, pamphlets, or other written  materials from your doctor or pharmacy?: 1 - Never  Interpreter Needed?: No  Information entered by :: NAllen LPN   Activities of Daily Living    08/23/2022   11:17 AM  In your present state of health, do you have any difficulty performing the following activities:  Hearing? 0  Vision? 0  Difficulty concentrating or making decisions? 0  Walking or climbing stairs? 0  Dressing or bathing? 0  Doing errands, shopping? 0  Preparing Food and eating ? N  Using the Toilet? N  In the past six months, have you accidently leaked urine? N  Do you have problems with loss of bowel control? N  Managing your Medications? N  Managing your Finances? N  Housekeeping or managing your Housekeeping? N    Patient Care Team: Arnette Felts, [name] as PCP - General (General Practice) Doreatha Massed, MD as Medical Oncologist (Medical Oncology)  Indicate any recent Medical Services you may have received from other than [facility] providers in the past year (date may be approximate).     Assessment:   This is a routine wellness examination for Kyle George.  Hearing/Vision screen Hearing Screening - Comments:: Denies hearing issues Vision Screening - Comments:: No regular eye exams,   Dietary issues and exercise activities discussed:     Goals Addressed             This Visit's Progress    Patient Stated       08/23/2022, wants to quit smoking completely       Depression Screen    08/23/2022   11:26 AM 04/03/2022    2:08 PM 02/07/2022    1:37 PM 09/08/2021    9:47 AM 08/10/2021   12:24 PM 05/27/2020     4:21 PM 03/20/2019    3:30 PM  PHQ 2/9 Scores  PHQ - 2 Score 0 0 0 0 0 0 0  PHQ- 9 Score 1      0    Fall Risk    08/23/2022   11:25 AM 04/03/2022    2:08 PM 09/08/2021    9:47 AM 08/10/2021   12:24 PM 05/27/2020    4:21 PM  Fall Risk   Falls in the past year? 0 1  0 0  Number falls in past yr: 0 0 0 0   Injury with Fall? 0 0 0 0   Risk for fall due to : Medication side effect  No Fall Risks Medication side effect Medication side effect  Follow up Falls prevention discussed;Falls evaluation completed  Falls evaluation completed Falls evaluation completed;Education provided;Falls prevention discussed Falls evaluation completed;Education provided;Falls prevention discussed    MEDICARE RISK AT HOME:  Medicare Risk at Home - 08/23/22 1125     Any stairs in or around the home? Yes    If so, are there any without handrails? No    Home free of loose throw rugs in walkways, pet beds, electrical cords, etc? Yes    Adequate lighting in your home to reduce risk of falls? Yes    Life alert? No    Use of a cane, walker or w/c? No    Grab bars in the bathroom? No    Shower chair or bench in shower? No    Elevated toilet seat or a handicapped toilet? No             TIMED UP AND GO:  Was the test performed?  Yes  Length of time to ambulate 10 feet: 5 sec Gait  steady and fast without use of assistive device    Cognitive Function:        08/23/2022   11:26 AM 08/10/2021   12:26 PM 05/27/2020    4:22 PM 03/20/2019    3:33 PM 12/03/2018    3:37 PM  6CIT Screen  What Year? 0 points 0 points 0 points 0 points 0 points  What month? 0 points 0 points 0 points 0 points 0 points  What time? 0 points 0 points 0 points 0 points 0 points  Count back from 20 0 points 0 points 0 points 0 points 0 points  Months in reverse 0 points 0 points 0 points 0 points 0 points  Repeat phrase 0 points 0 points 0 points 0 points 0 points  Total Score 0 points 0 points 0 points 0 points 0 points     Immunizations Immunization History  Administered Date(s) Administered   Fluad Quad(high Dose 65+) 11/25/2019, 12/15/2020   Influenza, High Dose Seasonal PF 12/03/2018   Influenza-Unspecified 01/10/2021   PFIZER(Purple Top)SARS-COV-2 Vaccination 04/11/2019, 05/09/2019, 01/01/2020   PNEUMOCOCCAL CONJUGATE-20 04/03/2022   Tdap 04/25/2013    TDAP status: Up to date  Flu Vaccine status: Due, Education has been provided regarding the importance of this vaccine. Advised may receive this vaccine at local pharmacy or Health Dept. Aware to provide a copy of the vaccination record if obtained from local pharmacy or Health Dept. Verbalized acceptance and understanding.  Pneumococcal vaccine status: Up to date  Covid-19 vaccine status: Information provided on how to obtain vaccines.   Qualifies for Shingles Vaccine? Yes   Zostavax completed No   Shingrix Completed?: No.    Education has been provided regarding the importance of this vaccine. Patient has been advised to call insurance company to determine out of pocket expense if they have not yet received this vaccine. Advised may also receive vaccine at local pharmacy or Health Dept. Verbalized acceptance and understanding.  Screening Tests Health Maintenance  Topic Date Due   OPHTHALMOLOGY EXAM  12/13/2019   HEMOGLOBIN A1C  03/09/2022   Colonoscopy  05/23/2022   INFLUENZA VACCINE  08/10/2022   Zoster Vaccines- Shingrix (1 of 2) 11/23/2022 (Originally 03/08/1969)   FOOT EXAM  09/09/2022   Diabetic kidney evaluation - Urine ACR  04/03/2023   DTaP/Tdap/Td (2 - Td or Tdap) 04/26/2023   Diabetic kidney evaluation - eGFR measurement  06/01/2023   Medicare Annual Wellness (AWV)  08/23/2023   Pneumonia Vaccine 21+ Years old  Completed   Hepatitis C Screening  Completed   HPV VACCINES  Aged Out   COVID-19 Vaccine  Discontinued    Health Maintenance  Health Maintenance Due  Topic Date Due   OPHTHALMOLOGY EXAM  12/13/2019   HEMOGLOBIN  A1C  03/09/2022   Colonoscopy  05/23/2022   INFLUENZA VACCINE  08/10/2022    Colorectal cancer screening: Type of screening: Colonoscopy. Completed 05/23/2019. Repeat every 3 years  Lung Cancer Screening: (Low Dose CT Chest recommended if Age 42-80 years, 20 pack-year currently smoking OR have quit w/in 15years.) does not qualify.   Lung Cancer Screening Referral: no  Additional Screening:  Hepatitis C Screening: does qualify; Completed 12/03/2018  Vision Screening: Recommended annual ophthalmology exams for early detection of glaucoma and other disorders of the eye. Is the patient up to date with their annual eye exam?  No  Who is the provider or what is the name of the office in which the patient attends annual eye exams? none If pt is  not established with a provider, would they like to be referred to a provider to establish care? No .   Dental Screening: Recommended annual dental exams for proper oral hygiene  Diabetic Foot Exam: Diabetic Foot Exam: Completed 09/08/2021  Community Resource Referral / Chronic Care Management: CRR required this visit?  No   CCM required this visit?  No     Plan:     I have personally reviewed and noted the following in the patient's chart:   Medical and social history Use of alcohol, tobacco or illicit drugs  Current medications and supplements including opioid prescriptions. Patient is not currently taking opioid prescriptions. Functional ability and status Nutritional status Physical activity Advanced directives List of other physicians Hospitalizations, surgeries, and ER visits in previous 12 months Vitals Screenings to include cognitive, depression, and falls Referrals and appointments  In addition, I have reviewed and discussed with patient certain preventive protocols, quality metrics, and best practice recommendations. A written personalized care plan for preventive services as well as general preventive health recommendations  were provided to patient.     Barb Merino, LPN   [id number]   After Visit Summary: in person  Nurse Notes: none

## 2022-08-23 NOTE — Patient Instructions (Signed)
This is the eye doctor you seen DR Kerry Hough [facility] [place] [address] [place], [place]  [address] [phone]

## 2022-08-23 NOTE — Progress Notes (Signed)
Madelaine Bhat, CMA,acting as a Neurosurgeon for Arnette Felts, [name].,have documented all relevant documentation on the behalf of Arnette Felts, [name],as directed by  Arnette Felts, [name] while in the presence of Arnette Felts, [name].  Subjective:  Patient ID: Kyle George , male    DOB: 1950/03/14 , 72 y.o.   MRN: [id number]  Chief Complaint  Patient presents with   Hypertension   Diabetes    HPI  Patient presents today for a BP and DM follow up, patient repots compliance with medications. Patient denies any chest pain, SOB, or headaches. Patient has no other concerns today. He is taking ozempic at 0.25 mg and has lost approximately 17 lbs. He tends to pick up weight during the holidays.   Wt Readings from Last 3 Encounters: 08/23/22 : 178 lb (80.7 kg) 08/23/22 : 178 lb (80.7 kg) 06/08/22 : 195 lb 3.2 oz (88.5 kg)    Hypertension This is a chronic problem. The current episode started more than 1 year ago. The problem is unchanged. The problem is controlled. Pertinent negatives include no chest pain, headaches, malaise/fatigue, palpitations or shortness of breath. There are no associated agents to hypertension. There are no known risk factors for coronary artery disease. Past treatments include ACE inhibitors. There are no compliance problems.  There is no history of kidney disease. There is no history of chronic renal disease.  Diabetes He presents for his follow-up diabetic visit. He has type 2 diabetes mellitus. There are no hypoglycemic associated symptoms. Pertinent negatives for hypoglycemia include no dizziness or headaches. There are no diabetic associated symptoms. Pertinent negatives for diabetes include no chest pain, no fatigue, no polydipsia, no polyphagia and no polyuria. There are no hypoglycemic complications. There are no diabetic complications. Risk factors for coronary artery disease include male sex and sedentary lifestyle. Current diabetic treatment includes oral agent (dual therapy).  He is compliant with treatment some of the time. He is following a generally healthy diet. When asked about meal planning, he reported none. He has not had a previous visit with a dietitian. 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 (Does not check his blood sugar at home. He does not have a machine) He does not see a podiatrist.Eye exam is not current.     Past Medical History:  Diagnosis Date   Cancer of prostate (HCC)    Diabetes mellitus without complication (HCC)    Hyperlipidemia    Hypertension      Family History  Problem Relation Age of Onset   Hypertension Mother    Diabetes Mother    Cancer Father        He is unsure of what type of cancer but it was not prostate cancer   Cancer Paternal Uncle        Pt is unsure of what type of cancer   Colon cancer Neg Hx    Esophageal cancer Neg Hx    Stomach cancer Neg Hx    Rectal cancer Neg Hx      Current Outpatient Medications:    aspirin 81 MG tablet, Take 81 mg by mouth daily., Disp: , Rfl:    blood glucose meter kit and supplies KIT, Dispense based on patient and insurance preference. Use up to four times daily as directed., Disp: 1 each, Rfl: 0   Blood Pressure Monitoring (BLOOD PRESSURE CUFF) MISC, by Does not apply route. Check blood pressure at least 4 times per week 3 hours after taking medication, Disp: , Rfl:  Calcium Carbonate-Vitamin D (CALCIUM 600+D PO), Take 1 tablet by mouth daily. (Patient not taking: Reported on 08/23/2022), Disp: , Rfl:    carvedilol (COREG) 6.25 MG tablet, TAKE 1 TABLET BY MOUTH TWICE A DAY WITH FOOD, Disp: 180 tablet, Rfl: 1   cholecalciferol (VITAMIN D) 1000 UNITS tablet, Take 5,000 Units by mouth daily., Disp: , Rfl:    enzalutamide (XTANDI) 40 MG tablet, TAKE 4 TABLETS (160 MG TOTAL) BY MOUTH DAILY., Disp: 120 tablet, Rfl: 2   glucose blood test strip, 1 each by Other route as needed for other. Use as instructed to check blood sugars, Disp: , Rfl:    Lancets (ONETOUCH DELICA PLUS  LANCET33G) MISC, by Does not apply route. Use as directed to check blood  Sugars 2 times per day dx E11.65, Disp: , Rfl:    Multiple Vitamin (MULTIVITAMIN WITH MINERALS) TABS, Take 1 tablet by mouth daily., Disp: , Rfl:    NON FORMULARY, Take 1 tablet by mouth daily. Thyroid  action (Patient not taking: Reported on 08/23/2022), Disp: , Rfl:    Olmesartan-amLODIPine-HCTZ 40-10-25 MG TABS, TAKE 1 TABLET BY MOUTH EVERY DAY, Disp: 90 tablet, Rfl: 1   Omega-3 Fatty Acids (FISH OIL PO), Take 1 tablet by mouth once., Disp: , Rfl:    pioglitazone-metformin (ACTOPLUS MET) 15-850 MG tablet, TAKE 1 TABLET BY MOUTH TWICE A DAY, Disp: 180 tablet, Rfl: 1   rosuvastatin (CRESTOR) 10 MG tablet, TAKE 1 TABLET BY MOUTH EVERY DAY, Disp: 90 tablet, Rfl: 1   Semaglutide,0.25 or 0.5MG /DOS, (OZEMPIC, 0.25 OR 0.5 MG/DOSE,) 2 MG/3ML SOPN, INJECT 0.25 MG WEEKLY FOR 4 WEEKS, THEN INCREASE TO 0.5 MG WEEKLY, Disp: 3 mL, Rfl: 2   spironolactone (ALDACTONE) 25 MG tablet, Take 1 tablet (25 mg total) by mouth daily., Disp: 90 tablet, Rfl: 1   No Known Allergies   Review of Systems  Constitutional: Negative.  Negative for fatigue and malaise/fatigue.  Eyes: Negative.   Respiratory: Negative.  Negative for shortness of breath.   Cardiovascular: Negative.  Negative for chest pain, palpitations and leg swelling.  Endocrine: Negative for polydipsia, polyphagia and polyuria.  Musculoskeletal: Negative.   Skin: Negative.   Neurological: Negative.  Negative for dizziness and headaches.  Psychiatric/Behavioral: Negative.       Today's Vitals   08/23/22 1137  BP: 130/72  Pulse: 71  Temp: 98.2 F (36.8 C)  Weight: 178 lb (80.7 kg)  Height: 5' 7.2" (1.707 m)   Body mass index is 27.71 kg/m.  Wt Readings from Last 3 Encounters:  08/23/22 178 lb (80.7 kg)  08/23/22 178 lb (80.7 kg)  06/08/22 195 lb 3.2 oz (88.5 kg)    The ASCVD Risk score (Arnett DK, et al., 2019) failed to calculate for the following reasons:   The valid  total cholesterol range is 130 to 320 mg/dL  Objective:  Physical Exam Vitals reviewed.  Constitutional:      General: He is not in acute distress.    Appearance: Normal appearance.  HENT:     Head: Normocephalic.     Mouth/Throat:     Dentition: Has dentures (upper and lower).  Eyes:     Pupils: Pupils are equal, round, and reactive to light.  Cardiovascular:     Rate and Rhythm: Normal rate and regular rhythm.     Pulses: Normal pulses.     Heart sounds: Normal heart sounds. No murmur heard. Pulmonary:     Effort: Pulmonary effort is normal. No respiratory distress.  Breath sounds: Normal breath sounds. No wheezing.  Genitourinary:    Comments: Being followed by urology Skin:    General: Skin is warm and dry.     Capillary Refill: Capillary refill takes less than 2 seconds.  Neurological:     General: No focal deficit present.     Mental Status: He is alert and oriented to person, place, and time.     Cranial Nerves: No cranial nerve deficit.     Motor: No weakness.  Psychiatric:        Mood and Affect: Mood normal.        Behavior: Behavior normal.        Thought Content: Thought content normal.        Judgment: Judgment normal.         Assessment And Plan:  Essential hypertension Assessment & Plan: Blood pressure is controlled.  Continue current medications.  Orders: -     Basic metabolic panel -     Spironolactone; Take 1 tablet (25 mg total) by mouth daily.  Dispense: 90 tablet; Refill: 1  Mixed hyperlipidemia Assessment & Plan: Cholesterol levels are stable.  Continue statin, tolerating well.  Orders: -     Lipid panel  Type 2 diabetes mellitus with other skin complication, with long-term current use of insulin (HCC) Assessment & Plan: Hemoglobin A1c is stable.  Continue current medications.  Tolerating well.  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  Patient was given opportunity to ask questions. Patient verbalized understanding of the plan and was able to repeat key elements of the plan. All questions were answered to their satisfaction.   Jeanell Sparrow, [name], have reviewed all documentation for this visit. The documentation on 08/23/22 for the exam, diagnosis, procedures, and orders are all accurate and complete.   IF YOU HAVE BEEN REFERRED TO A SPECIALIST, IT MAY TAKE 1-2 WEEKS TO SCHEDULE/PROCESS THE REFERRAL. IF YOU HAVE NOT HEARD FROM US/SPECIALIST IN TWO WEEKS, PLEASE GIVE [place] A CALL AT [phone].

## 2022-08-23 NOTE — Patient Instructions (Signed)
[name] , Thank you for taking time to come for your Medicare Wellness Visit. I appreciate your ongoing commitment to your health goals. Please review the following plan we discussed and let me know if I can assist you in the future.   Referrals/Orders/Follow-Ups/Clinician Recommendations: none  This is a list of the screening recommended for you and due dates:  Health Maintenance  Topic Date Due   Eye exam for diabetics  12/13/2019   Hemoglobin A1C  03/09/2022   Colon Cancer Screening  05/23/2022   Flu Shot  08/10/2022   Zoster (Shingles) Vaccine (1 of 2) 11/23/2022*   Complete foot exam   09/09/2022   Yearly kidney health urinalysis for diabetes  04/03/2023   DTaP/Tdap/Td vaccine (2 - Td or Tdap) 04/26/2023   Yearly kidney function blood test for diabetes  06/01/2023   Medicare Annual Wellness Visit  08/23/2023   Pneumonia Vaccine  Completed   Hepatitis C Screening  Completed   HPV Vaccine  Aged Out   COVID-19 Vaccine  Discontinued  *Topic was postponed. The date shown is not the original due date.    Advanced directives: (Declined) Advance directive discussed with you today. Even though you declined this today, please call our office should you change your mind, and we can give you the proper paperwork for you to fill out.  Next Medicare Annual Wellness Visit scheduled for next year: No, office will schedule  Preventive Care 65 Years and Older, Male  Preventive care refers to lifestyle choices and visits with your health care provider that can promote health and wellness. What does preventive care include? A yearly physical exam. This is also called an annual well check. Dental exams once or twice a year. Routine eye exams. Ask your health care provider how often you should have your eyes checked. Personal lifestyle choices, including: Daily care of your teeth and gums. Regular physical activity. Eating a healthy diet. Avoiding tobacco and drug use. Limiting alcohol  use. Practicing safe sex. Taking low doses of aspirin every day. Taking vitamin and mineral supplements as recommended by your health care provider. What happens during an annual well check? The services and screenings done by your health care provider during your annual well check will depend on your age, overall health, lifestyle risk factors, and family history of disease. Counseling  Your health care provider may ask you questions about your: Alcohol use. Tobacco use. Drug use. Emotional well-being. Home and relationship well-being. Sexual activity. Eating habits. History of falls. Memory and ability to understand (cognition). Work and work Astronomer. Screening  You may have the following tests or measurements: Height, weight, and BMI. Blood pressure. Lipid and cholesterol levels. These may be checked every 5 years, or more frequently if you are over 21 years old. Skin check. Lung cancer screening. You may have this screening every year starting at age 58 if you have a 30-pack-year history of smoking and currently smoke or have quit within the past 15 years. Fecal occult blood test (FOBT) of the stool. You may have this test every year starting at age 72. Flexible sigmoidoscopy or colonoscopy. You may have a sigmoidoscopy every 5 years or a colonoscopy every 10 years starting at age 52. Prostate cancer screening. Recommendations will vary depending on your family history and other risks. Hepatitis C blood test. Hepatitis B blood test. Sexually transmitted disease (STD) testing. Diabetes screening. This is done by checking your blood sugar (glucose) after you have not eaten for a while (fasting).  You may have this done every 1-3 years. Abdominal aortic aneurysm (AAA) screening. You may need this if you are a current or former smoker. Osteoporosis. You may be screened starting at age 58 if you are at high risk. Talk with your health care provider about your test results,  treatment options, and if necessary, the need for more tests. Vaccines  Your health care provider may recommend certain vaccines, such as: Influenza vaccine. This is recommended every year. Tetanus, diphtheria, and acellular pertussis (Tdap, Td) vaccine. You may need a Td booster every 10 years. Zoster vaccine. You may need this after age 31. Pneumococcal 13-valent conjugate (PCV13) vaccine. One dose is recommended after age 64. Pneumococcal polysaccharide (PPSV23) vaccine. One dose is recommended after age 59. Talk to your health care provider about which screenings and vaccines you need and how often you need them. This information is not intended to replace advice given to you by your health care provider. Make sure you discuss any questions you have with your health care provider. Document Released: 01/22/2015 Document Revised: 09/15/2015 Document Reviewed: 10/27/2014 Elsevier Interactive Patient Education  2017 [employer].  Fall Prevention in the Home Falls can cause injuries. They can happen to people of all ages. There are many things you can do to make your home safe and to help prevent falls. What can I do on the outside of my home? Regularly fix the edges of walkways and driveways and fix any cracks. Remove anything that might make you trip as you walk through a door, such as a raised step or threshold. Trim any bushes or trees on the path to your home. Use bright outdoor lighting. Clear any walking paths of anything that might make someone trip, such as rocks or tools. Regularly check to see if handrails are loose or broken. Make sure that both sides of any steps have handrails. Any raised decks and porches should have guardrails on the edges. Have any leaves, snow, or ice cleared regularly. Use sand or salt on walking paths during winter. Clean up any spills in your garage right away. This includes oil or grease spills. What can I do in the bathroom? Use night  lights. Install grab bars by the toilet and in the tub and shower. Do not use towel bars as grab bars. Use non-skid mats or decals in the tub or shower. If you need to sit down in the shower, use a plastic, non-slip stool. Keep the floor dry. Clean up any water that spills on the floor as soon as it happens. Remove soap buildup in the tub or shower regularly. Attach bath mats securely with double-sided non-slip rug tape. Do not have throw rugs and other things on the floor that can make you trip. What can I do in the bedroom? Use night lights. Make sure that you have a light by your bed that is easy to reach. Do not use any sheets or blankets that are too big for your bed. They should not hang down onto the floor. Have a firm chair that has side arms. You can use this for support while you get dressed. Do not have throw rugs and other things on the floor that can make you trip. What can I do in the kitchen? Clean up any spills right away. Avoid walking on wet floors. Keep items that you use a lot in easy-to-reach places. If you need to reach something above you, use a strong step stool that has a grab bar. Keep  electrical cords out of the way. Do not use floor polish or wax that makes floors slippery. If you must use wax, use non-skid floor wax. Do not have throw rugs and other things on the floor that can make you trip. What can I do with my stairs? Do not leave any items on the stairs. Make sure that there are handrails on both sides of the stairs and use them. Fix handrails that are broken or loose. Make sure that handrails are as long as the stairways. Check any carpeting to make sure that it is firmly attached to the stairs. Fix any carpet that is loose or worn. Avoid having throw rugs at the top or bottom of the stairs. If you do have throw rugs, attach them to the floor with carpet tape. Make sure that you have a light switch at the top of the stairs and the bottom of the stairs. If  you do not have them, ask someone to add them for you. What else can I do to help prevent falls? Wear shoes that: Do not have high heels. Have rubber bottoms. Are comfortable and fit you well. Are closed at the toe. Do not wear sandals. If you use a stepladder: Make sure that it is fully opened. Do not climb a closed stepladder. Make sure that both sides of the stepladder are locked into place. Ask someone to hold it for you, if possible. Clearly mark and make sure that you can see: Any grab bars or handrails. First and last steps. Where the edge of each step is. Use tools that help you move around (mobility aids) if they are needed. These include: Canes. Walkers. Scooters. Crutches. Turn on the lights when you go into a dark area. Replace any light bulbs as soon as they burn out. Set up your furniture so you have a clear path. Avoid moving your furniture around. If any of your floors are uneven, fix them. If there are any pets around you, be aware of where they are. Review your medicines with your doctor. Some medicines can make you feel dizzy. This can increase your chance of falling. Ask your doctor what other things that you can do to help prevent falls. This information is not intended to replace advice given to you by your health care provider. Make sure you discuss any questions you have with your health care provider. Document Released: 10/22/2008 Document Revised: 06/03/2015 Document Reviewed: 01/30/2014 Elsevier Interactive Patient Education  2017 [employer].

## 2022-08-24 LAB — BASIC METABOLIC PANEL
BUN/Creatinine Ratio: 21 (ref 10–24)
BUN: 18 mg/dL (ref 8–27)
CO2: 23 mmol/L (ref 20–29)
Calcium: 9.1 mg/dL (ref 8.6–10.2)
Chloride: 103 mmol/L (ref 96–106)
Creatinine, Ser: 0.84 mg/dL (ref 0.76–1.27)
Glucose: 70 mg/dL (ref 70–99)
Potassium: 4.1 mmol/L (ref 3.5–5.2)
Sodium: 142 mmol/L (ref 134–144)
eGFR: 92 mL/min/{1.73_m2} (ref >59)

## 2022-08-24 LAB — LIPID PANEL
Chol/HDL Ratio: 1.8 ratio (ref 0.0–5.0)
Cholesterol, Total: 125 mg/dL (ref 100–199)
HDL: 70 mg/dL (ref >39)
LDL Chol Calc (NIH): 41 mg/dL (ref 0–99)
Triglycerides: 66 mg/dL (ref 0–149)
VLDL Cholesterol Cal: 14 mg/dL (ref 5–40)

## 2022-08-24 LAB — HEMOGLOBIN A1C
Est. average glucose Bld gHb Est-mCnc: 117 mg/dL
Hgb A1c MFr Bld: 5.7 % — ABNORMAL HIGH (ref 4.8–5.6)

## 2022-09-04 NOTE — Assessment & Plan Note (Signed)
Cholesterol levels are stable.  Continue statin, tolerating well.

## 2022-09-04 NOTE — Assessment & Plan Note (Signed)
Blood pressure is controlled. Continue current medications.

## 2022-09-04 NOTE — Assessment & Plan Note (Signed)
Hemoglobin A1c is stable.  Continue current medications.  Tolerating well.

## 2022-09-04 NOTE — Assessment & Plan Note (Signed)
Continue statin. 

## 2022-09-04 NOTE — Assessment & Plan Note (Signed)
Continue follow-up with oncology 

## 2022-09-05 ENCOUNTER — Other Ambulatory Visit (HOSPITAL_COMMUNITY): Payer: Self-pay

## 2022-09-07 DIAGNOSIS — C61 Malignant neoplasm of prostate: Secondary | ICD-10-CM | POA: Insufficient documentation

## 2022-09-12 ENCOUNTER — Other Ambulatory Visit (HOSPITAL_COMMUNITY): Payer: Self-pay

## 2022-09-21 LAB — HM DIABETES EYE EXAM

## 2022-10-03 ENCOUNTER — Inpatient Hospital Stay: Payer: Medicare PPO | Attending: Nurse Practitioner

## 2022-10-03 DIAGNOSIS — C61 Malignant neoplasm of prostate: Secondary | ICD-10-CM | POA: Diagnosis not present

## 2022-10-03 LAB — COMPREHENSIVE METABOLIC PANEL
ALT: 9 U/L (ref 0–44)
AST: 15 U/L (ref 15–41)
Albumin: 3.7 g/dL (ref 3.5–5.0)
Alkaline Phosphatase: 54 U/L (ref 38–126)
Anion gap: 10 (ref 5–15)
BUN: 28 mg/dL — ABNORMAL HIGH (ref 8–23)
CO2: 24 mmol/L (ref 22–32)
Calcium: 8.9 mg/dL (ref 8.9–10.3)
Chloride: 106 mmol/L (ref 98–111)
Creatinine, Ser: 1.15 mg/dL (ref 0.61–1.24)
GFR, Estimated: >60 mL/min (ref >60)
Glucose, Bld: 83 mg/dL (ref 70–99)
Potassium: 3.7 mmol/L (ref 3.5–5.1)
Sodium: 140 mmol/L (ref 135–145)
Total Bilirubin: 0.6 mg/dL (ref 0.3–1.2)
Total Protein: 7.4 g/dL (ref 6.5–8.1)

## 2022-10-03 LAB — CBC WITH DIFFERENTIAL/PLATELET
Abs Immature Granulocytes: 0.02 10*3/uL (ref 0.00–0.07)
Basophils Absolute: 0 10*3/uL (ref 0.0–0.1)
Basophils Relative: 1 %
Eosinophils Absolute: 0.3 10*3/uL (ref 0.0–0.5)
Eosinophils Relative: 5 %
HCT: 40.3 % (ref 39.0–52.0)
Hemoglobin: 13 g/dL (ref 13.0–17.0)
Immature Granulocytes: 0 %
Lymphocytes Relative: 26 %
Lymphs Abs: 1.5 10*3/uL (ref 0.7–4.0)
MCH: 34.3 pg — ABNORMAL HIGH (ref 26.0–34.0)
MCHC: 32.3 g/dL (ref 30.0–36.0)
MCV: 106.3 fL — ABNORMAL HIGH (ref 80.0–100.0)
Monocytes Absolute: 0.4 10*3/uL (ref 0.1–1.0)
Monocytes Relative: 6 %
Neutro Abs: 3.5 10*3/uL (ref 1.7–7.7)
Neutrophils Relative %: 62 %
Platelets: 170 10*3/uL (ref 150–400)
RBC: 3.79 MIL/uL — ABNORMAL LOW (ref 4.22–5.81)
RDW: 15.9 % — ABNORMAL HIGH (ref 11.5–15.5)
WBC: 5.8 10*3/uL (ref 4.0–10.5)
nRBC: 0 % (ref 0.0–0.2)

## 2022-10-03 LAB — PSA: Prostatic Specific Antigen: <0.01 ng/mL (ref 0.00–4.00)

## 2022-10-04 LAB — TESTOSTERONE: Testosterone: 7 ng/dL — ABNORMAL LOW (ref 264–916)

## 2022-10-05 ENCOUNTER — Other Ambulatory Visit: Payer: Self-pay | Admitting: Nurse Practitioner

## 2022-10-06 ENCOUNTER — Other Ambulatory Visit: Payer: Self-pay

## 2022-10-09 ENCOUNTER — Other Ambulatory Visit: Payer: Self-pay

## 2022-10-09 ENCOUNTER — Other Ambulatory Visit: Payer: Self-pay | Admitting: *Deleted

## 2022-10-09 ENCOUNTER — Other Ambulatory Visit: Payer: Self-pay | Admitting: Hematology

## 2022-10-09 DIAGNOSIS — C61 Malignant neoplasm of prostate: Secondary | ICD-10-CM

## 2022-10-09 MED ORDER — ENZALUTAMIDE 40 MG PO TABS
ORAL_TABLET | ORAL | 2 refills | Status: DC
  Filled 2022-10-09: qty 120, 30d supply, fill #0
  Filled 2022-11-15: qty 120, 30d supply, fill #1
  Filled 2022-12-14: qty 120, 30d supply, fill #2

## 2022-10-09 NOTE — Progress Notes (Signed)
Specialty Pharmacy Refill Coordination Note  Kyle George is a 72 y.o. male contacted today regarding refills of specialty medication(s) Enzalutamide .  Patient requested Delivery  on 10/19/22  to verified address [address], [place], [place], [address]   Medication will be filled on 10/18/2022.

## 2022-10-09 NOTE — Telephone Encounter (Signed)
Xtandi refill approved.  Patient tolerating and is to continue therapy.

## 2022-10-10 ENCOUNTER — Inpatient Hospital Stay: Payer: Medicare PPO | Attending: Nurse Practitioner | Admitting: Hematology

## 2022-10-10 ENCOUNTER — Inpatient Hospital Stay: Payer: Medicare PPO

## 2022-10-10 VITALS — BP 158/81 | HR 73 | Temp 97.5°F | Resp 16 | Wt 182.4 lb

## 2022-10-10 DIAGNOSIS — Z7982 Long term (current) use of aspirin: Secondary | ICD-10-CM | POA: Insufficient documentation

## 2022-10-10 DIAGNOSIS — C61 Malignant neoplasm of prostate: Secondary | ICD-10-CM | POA: Insufficient documentation

## 2022-10-10 DIAGNOSIS — E785 Hyperlipidemia, unspecified: Secondary | ICD-10-CM | POA: Diagnosis not present

## 2022-10-10 DIAGNOSIS — F17210 Nicotine dependence, cigarettes, uncomplicated: Secondary | ICD-10-CM | POA: Insufficient documentation

## 2022-10-10 DIAGNOSIS — Z809 Family history of malignant neoplasm, unspecified: Secondary | ICD-10-CM | POA: Diagnosis not present

## 2022-10-10 DIAGNOSIS — C779 Secondary and unspecified malignant neoplasm of lymph node, unspecified: Secondary | ICD-10-CM | POA: Insufficient documentation

## 2022-10-10 DIAGNOSIS — Z79818 Long term (current) use of other agents affecting estrogen receptors and estrogen levels: Secondary | ICD-10-CM | POA: Diagnosis not present

## 2022-10-10 DIAGNOSIS — I10 Essential (primary) hypertension: Secondary | ICD-10-CM | POA: Diagnosis not present

## 2022-10-10 DIAGNOSIS — C7951 Secondary malignant neoplasm of bone: Secondary | ICD-10-CM | POA: Diagnosis not present

## 2022-10-10 DIAGNOSIS — Z79899 Other long term (current) drug therapy: Secondary | ICD-10-CM | POA: Diagnosis not present

## 2022-10-10 DIAGNOSIS — Z7984 Long term (current) use of oral hypoglycemic drugs: Secondary | ICD-10-CM | POA: Insufficient documentation

## 2022-10-10 DIAGNOSIS — Z7985 Long-term (current) use of injectable non-insulin antidiabetic drugs: Secondary | ICD-10-CM | POA: Insufficient documentation

## 2022-10-10 MED ORDER — LEUPROLIDE ACETATE (4 MONTH) 30 MG ~~LOC~~ KIT
30.0000 mg | PACK | Freq: Once | SUBCUTANEOUS | Status: AC
  Administered 2022-10-10: 30 mg via SUBCUTANEOUS
  Filled 2022-10-10: qty 30

## 2022-10-10 NOTE — Progress Notes (Signed)
Verbal orders from Doretha Sou LPN to proceed with Eligard injection. Xgeva injection went to review per A. [employer] Firefighter.   Eligard given today per MD orders. Tolerated  without adverse affects. Vital signs stable. No complaints at this time. Discharged from clinic ambulatory in stable condition. Alert and oriented x 3. F/U with [facility] as scheduled.

## 2022-10-10 NOTE — Patient Instructions (Signed)
[facility]  Discharge Instructions  You were seen and examined today by Dr. Ellin Saba.  Dr. Ellin Saba discussed your most recent lab work which revealed that everything looks and stable.  Follow-up as scheduled in 4 months.    Thank you for choosing [facility] to provide your oncology and hematology care.   To afford each patient quality time with our provider, please arrive at least 15 minutes before your scheduled appointment time. You may need to reschedule your appointment if you arrive late (10 or more minutes). Arriving late affects you and other patients whose appointments are after yours.  Also, if you miss three or more appointments without notifying the office, you may be dismissed from the clinic at the provider's discretion.    Again, thank you for choosing [facility] [place] [facility].  Our hope is that these requests will decrease the amount of time that you wait before being seen by our physicians.   If you have a lab appointment with the Cancer Center - please note that after April 8th, all labs will be drawn in the cancer center.  You do not have to check in or register with the main entrance as you have in the past but will complete your check-in at the cancer center.            _____________________________________________________________  Should you have questions after your visit to [facility], please contact our office at [phone] and follow the prompts.  Our office hours are 8:00 a.m. to 4:30 p.m. Monday - Thursday and 8:00 a.m. to 2:30 p.m. Friday.  Please note that voicemails left after 4:00 p.m. may not be returned until the following business day.  We are closed weekends and all major holidays.  You do have access to a nurse 24-7, just call the main number to the clinic [phone] and do not press any options, hold on the line and a nurse will answer the phone.    For prescription refill  requests, have your pharmacy contact our office and allow 72 hours.    Masks are no longer required in the cancer centers. If you would like for your care team to wear a mask while they are taking care of you, please let them know. You may have one support person who is at least 74 years old accompany you for your appointments.

## 2022-10-10 NOTE — Patient Instructions (Signed)
[facility]  Discharge Instructions: Thank you for choosing [facility] to provide your oncology and hematology care.  If you have a lab appointment with the Cancer Center - please note that after April 8th, 2024, all labs will be drawn in the cancer center.  You do not have to check in or register with the main entrance as you have in the past but will complete your check-in in the cancer center.  Wear comfortable clothing and clothing appropriate for easy access to any Portacath or PICC line.   We strive to give you quality time with your provider. You may need to reschedule your appointment if you arrive late (15 or more minutes).  Arriving late affects you and other patients whose appointments are after yours.  Also, if you miss three or more appointments without notifying the office, you may be dismissed from the clinic at the provider's discretion.      For prescription refill requests, have your pharmacy contact our office and allow 72 hours for refills to be completed.    Today you received the following chemotherapy and/or immunotherapy agents Eligard.  Leuprolide Suspension for Injection (Prostate Cancer) What is this medication? LEUPROLIDE (loo PROE lide) reduces the symptoms of prostate cancer. It works by decreasing levels of the hormone testosterone in the body. This prevents prostate cancer cells from spreading or growing. This medicine may be used for other purposes; ask your health care provider or pharmacist if you have questions. COMMON BRAND NAME(S): Eligard, Lupron Depot, Lutrate Depot What should I tell my care team before I take this medication? They need to know if you have any of these conditions: Diabetes Heart disease Heart failure High or low levels of electrolytes, such as magnesium, potassium, or sodium in your blood Irregular heartbeat or rhythm Seizures An unusual or allergic reaction to leuprolide, other medications, foods,  dyes, or preservatives Pregnant or trying to get pregnant Breast-feeding How should I use this medication? This medication is injected under the skin or into a muscle. It is given by your care team in a hospital or clinic setting. Talk to your care team about the use of this medication in children. Special care may be needed. Overdosage: If you think you have taken too much of this medicine contact a poison control center or emergency room at once. NOTE: This medicine is only for you. Do not share this medicine with others. What if I miss a dose? Keep appointments for follow-up doses. It is important not to miss your dose. Call your care team if you are unable to keep an appointment. What may interact with this medication? Do not take this medication with any of the following: Cisapride Dronedarone Ketoconazole Levoketoconazole Pimozide Thioridazine This medication may also interact with the following: Other medications that cause heart rhythm changes This list may not describe all possible interactions. Give your health care provider a list of all the medicines, herbs, non-prescription drugs, or dietary supplements you use. Also tell them if you smoke, drink alcohol, or use illegal drugs. Some items may interact with your medicine. What should I watch for while using this medication? Visit your care team for regular checks on your progress. Tell your care team if your symptoms do not start to get better or if they get worse. This medication may increase blood sugar. The risk may be higher in patients who already have diabetes. Ask your care team what you can do to lower the risk of  diabetes while taking this medication. This medication may cause infertility. Talk to your care team if you are concerned about your fertility. Heart attacks and strokes have been reported with the use of this medication. Get emergency help if you develop signs or symptoms of a heart attack or stroke. Talk to  your care team about the risks and benefits of this medication. What side effects may I notice from receiving this medication? Side effects that you should report to your care team as soon as possible: Allergic reactions--skin rash, itching, hives, swelling of the face, lips, tongue, or throat Heart attack--pain or tightness in the chest, shoulders, arms, or jaw, nausea, shortness of breath, cold or clammy skin, feeling faint or lightheaded Heart rhythm changes--fast or irregular heartbeat, dizziness, feeling faint or lightheaded, chest pain, trouble breathing High blood sugar (hyperglycemia)--increased thirst or amount of urine, unusual weakness or fatigue, blurry vision New or worsening seizures Redness, blistering, peeling, or loosening of the skin, including inside the mouth Stroke--sudden numbness or weakness of the face, arm, or leg, trouble speaking, confusion, trouble walking, loss of balance or coordination, dizziness, severe headache, change in vision Swelling and pain of the tumor site or lymph nodes Side effects that usually do not require medical attention (report these to your care team if they continue or are bothersome): Change in sex drive or performance Hot flashes Joint pain Pain, redness, or irritation at injection site Swelling of the ankles, hands, or feet Unusual weakness or fatigue This list may not describe all possible side effects. Call your doctor for medical advice about side effects. You may report side effects to FDA at 1-800-FDA-1088. Where should I keep my medication? This medication is given in a hospital or clinic. It will not be stored at home. NOTE: This sheet is a summary. It may not cover all possible information. If you have questions about this medicine, talk to your doctor, pharmacist, or health care provider.  2024 Elsevier/Gold Standard (2022-04-28 00:00:00)        To help prevent nausea and vomiting after your treatment, we encourage you to take  your nausea medication as directed.  BELOW ARE SYMPTOMS THAT SHOULD BE REPORTED IMMEDIATELY: *FEVER GREATER THAN 100.4 F (38 C) OR HIGHER *CHILLS OR SWEATING *NAUSEA AND VOMITING THAT IS NOT CONTROLLED WITH YOUR NAUSEA MEDICATION *UNUSUAL SHORTNESS OF BREATH *UNUSUAL BRUISING OR BLEEDING *URINARY PROBLEMS (pain or burning when urinating, or frequent urination) *BOWEL PROBLEMS (unusual diarrhea, constipation, pain near the anus) TENDERNESS IN MOUTH AND THROAT WITH OR WITHOUT PRESENCE OF ULCERS (sore throat, sores in mouth, or a toothache) UNUSUAL RASH, SWELLING OR PAIN  UNUSUAL VAGINAL DISCHARGE OR ITCHING   Items with * indicate a potential emergency and should be followed up as soon as possible or go to the Emergency Department if any problems should occur.  Please show the CHEMOTHERAPY ALERT CARD or IMMUNOTHERAPY ALERT CARD at check-in to the Emergency Department and triage nurse.  Should you have questions after your visit or need to cancel or reschedule your appointment, please contact [facility] [phone]  and follow the prompts.  Office hours are 8:00 a.m. to 4:30 p.m. Monday - Friday. Please note that voicemails left after 4:00 p.m. may not be returned until the following business day.  We are closed weekends and major holidays. You have access to a nurse at all times for urgent questions. Please call the main number to the clinic [phone] and follow the prompts.  For any non-urgent questions,  you may also contact your provider using MyChart. We now offer e-Visits for anyone 52 and older to request care online for non-urgent symptoms. For details visit [email or web address].   Also download the MyChart app! Go to the app store, search "MyChart", open the app, select [place], and log in with your MyChart username and password.

## 2022-10-10 NOTE — Progress Notes (Signed)
[facility] [address] [place] [address]    Clinic Day:  10/10/2022  Referring physician: Arnette Felts, [name]  Patient Care Team: Kyle Felts, [name] as PCP - General (General Practice) Kyle Massed, MD as Medical Oncologist (Medical Oncology)   ASSESSMENT & PLAN:   Assessment: 1.  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 - CT CAP (03/24/2020): Mildly improved retroperitoneal and left pelvic sidewall adenopathy but with new and substantially increased sclerosis metastatic lesions.  Upper normal partially calcified subcarinal lymph nodes. - Xtandi 160 mg daily started on 03/01/2020.   2.  Social/family history: - Lives at home with his wife.  Independent of ADLs and IADLs.  Does gardening.  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 - Current active smoker, 3 to 4 cigarettes/day.  Smoked 1 pack/day for more than 30 years. - Father and paternal uncle had cancers, type not known to the patient.    Plan: 1.  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 - He has mildly decreased appetite but he has not lost any weight.  He is also on Ozempic. - Reviewed labs from 10/03/2022: Normal LFTs and creatinine.  CBC grossly normal.  PSA is less than 0.01. - He will continue enzalutamide 160 mg daily. - He will receive Eligard 30 mg today. - Recommend follow-up with 4 months with repeat labs..   2.  Metastatic bone disease: - We have talked about initiating him on  bone protecting agents with denosumab to decrease skeletal related events. - We discussed side effects including hypocalcemia and rare chance of osteonecrosis of the jaw.  He is edentulous and wears dentures. - Calcium is 8.9 with Creatinine 1.15. - We will obtain authorization from his insurance and will start him on denosumab monthly.  Orders Placed This Encounter  Procedures   CBC with Differential/Platelet    Standing Status:   Future    Standing Expiration Date:   10/10/2023    Order Specific Question:   Release to patient    Answer:   Immediate   Comprehensive metabolic panel    Standing Status:   Future    Standing Expiration Date:   10/10/2023    Order Specific Question:   Release to patient    Answer:   Immediate   VITAMIN D 25 Hydroxy (Vit-D Deficiency, Fractures)    Standing Status:   Future    Standing Expiration Date:   10/10/2023    Order Specific Question:   Release to patient    Answer:   Immediate   PSA    Standing Status:   Future    Standing Expiration Date:   10/10/2023      Kyle George,acting as a scribe for Kyle Massed, MD.,have documented all relevant documentation on the behalf of Kyle Massed, MD,as directed by  Kyle Massed, MD while in the presence of Kyle Massed, MD.  I, Kyle Massed MD, have reviewed the above documentation for accuracy and completeness, and I agree with the above.  Kyle Massed, MD   10/1/20244:34 PM  CHIEF COMPLAINT:   Diagnosis: metastatic prostate cancer   Cancer Staging  Secondary malignant neoplasm of bone [facility]) Staging form: Bone - Appendicular Skeleton, Trunk, Skull, and Facial Bones, AJCC 8th Edition - Clinical: Stage IVB (cTX, cNX, pM1b) - Signed by Benjiman Core, MD on 04/29/2020    Prior Therapy: 1. ADT Deborra Medina, Lupron) 11/2011 - 12/2013 2. abiraterone 08/2016 - 01/2020  Current Therapy:  [name]; ADT (Eligard)   HISTORY OF PRESENT ILLNESS:   Oncology  History  Secondary malignant neoplasm of bone (HCC)  11/25/2019 Initial Diagnosis   Secondary malignant neoplasm of bone (HCC)   04/29/2020 Cancer Staging   Staging form: Bone - Appendicular Skeleton, Trunk, Skull, and Facial Bones, AJCC 8th Edition - Clinical: Stage IVB (cTX, cNX, pM1b) - Signed by Benjiman Core, MD on 04/29/2020      INTERVAL HISTORY:   Kyle George is a 72 y.o. male presenting to clinic today for follow up of metastatic prostate cancer. He was last seen by me on 06/08/22.  Today, he states that he is doing well overall. His appetite level is at 50%. His energy level is at 75%.  He has been on Ozempic and notes his appetite has decreased. He denies any issues with treatment. He has upper and lower dentures.   PAST MEDICAL HISTORY:   Past Medical History: Past Medical History:  Diagnosis Date   Cancer of prostate (HCC)    Diabetes mellitus without complication (HCC)    Hyperlipidemia    Hypertension     Surgical History: Past Surgical History:  Procedure Laterality Date   COLONOSCOPY     HEMORRHOID SURGERY      Social History: Social History   Socioeconomic History   Marital status: Married    Spouse name: Not on file   Number of children: Not on file   Years of education: Not on file   Highest education level: Not on file  Occupational History   Not on file  Tobacco Use   Smoking status: Some Days    Current packs/day: 0.25    Average packs/day: 0.3 packs/day for 50.0 years (12.5 ttl pk-yrs)    Types: Cigarettes   Smokeless tobacco: Former    Types: Chew    Quit date: 1975   Tobacco comments:    smoked about 1/2 PPD for about 10 years; now at 4-5 cigarettes per day   Vaping Use   Vaping status: Never Used  Substance and Sexual Activity   Alcohol use: Yes    Comment: occassionally   Drug use: Never   Sexual activity: Yes  Other Topics Concern   Not on file  Social History Narrative   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   Social Determinants of Health   Financial Resource Strain: Low Risk  (08/23/2022)   Overall Financial Resource Strain (CARDIA)    Difficulty of Paying Living Expenses: Not hard at all  Food Insecurity: No Food Insecurity (08/23/2022)   Hunger Vital Sign    Worried About Running Out of Food in the Last Year: Never true    Ran Out of Food in the Last Year: Never true  Transportation Needs: No Transportation Needs (08/23/2022)   PRAPARE - Administrator, Civil Service (Medical): No    Lack of Transportation (Non-Medical): No  Physical Activity: Sufficiently Active (08/23/2022)   Exercise Vital Sign    Days of Exercise per Week: 6  days    Minutes of Exercise per Session: 90 min  Stress: No Stress Concern Present (08/23/2022)   [employer] of Occupational Health - Occupational Stress Questionnaire    Feeling of Stress : Not at all  Social Connections: Moderately Integrated (08/23/2022)   Social Connection and Isolation Panel [NHANES]    Frequency of Communication with Friends and Family: More than three times a week    Frequency of Social Gatherings with Friends and Family: Once a week    Attends Religious Services: More than 4 times per year    Active Member of [employer] or Organizations: No    Attends Banker Meetings: Never    Marital Status: Married  Catering manager Violence: Not At Risk (08/23/2022)   Humiliation, Afraid, Rape, and Kick questionnaire    Fear of Current or Ex-Partner: No    Emotionally Abused: No    Physically Abused: No    Sexually Abused: No    Family History: Family History  Problem Relation Age of Onset   Hypertension Mother    Diabetes Mother    Cancer Father        He is unsure of what type of cancer but it was not prostate cancer   Cancer Paternal Uncle        Pt is unsure of what type of cancer   Colon cancer Neg Hx    Esophageal cancer Neg Hx    Stomach cancer Neg Hx    Rectal cancer Neg Hx     Current  Medications:  Current Outpatient Medications:    aspirin 81 MG tablet, Take 81 mg by mouth daily., Disp: , Rfl:    blood glucose meter kit and supplies KIT, Dispense based on patient and insurance preference. Use up to four times daily as directed., Disp: 1 each, Rfl: 0   Blood Pressure Monitoring (BLOOD PRESSURE CUFF) MISC, by Does not apply route. Check blood pressure at least 4 times per week 3 hours after taking medication, Disp: , Rfl:    Calcium Carbonate-Vitamin D (CALCIUM 600+D PO), Take 1 tablet by mouth daily., Disp: , Rfl:    carvedilol (COREG) 6.25 MG tablet, TAKE 1 TABLET BY MOUTH TWICE A DAY WITH FOOD, Disp: 180 tablet, Rfl: 1   cholecalciferol (VITAMIN D) 1000 UNITS tablet, Take 5,000 Units by mouth daily., Disp: , Rfl:    enzalutamide (XTANDI) 40 MG tablet, TAKE 4 TABLETS (160 MG TOTAL) BY MOUTH DAILY., Disp: 120 tablet, Rfl: 2   glucose blood test strip, 1 each by Other route as needed for other. Use as instructed to check blood sugars, Disp: , Rfl:    Lancets (ONETOUCH DELICA PLUS LANCET33G) MISC, by Does not apply route. Use as directed to check blood  Sugars 2 times per day dx E11.65, Disp: , Rfl:    Multiple Vitamin (MULTIVITAMIN WITH MINERALS) TABS, Take 1 tablet by mouth daily., Disp: , Rfl:    NON FORMULARY, Take 1 tablet by mouth daily. Thyroid  action, Disp: , Rfl:    Olmesartan-amLODIPine-HCTZ 40-10-25 MG TABS, TAKE 1 TABLET BY MOUTH EVERY DAY, Disp: 90 tablet, Rfl: 1   Omega-3 Fatty Acids (FISH OIL PO), Take 1 tablet by mouth once., Disp: , Rfl:    pioglitazone-metformin (ACTOPLUS MET) 15-850 MG tablet, TAKE 1 TABLET BY MOUTH TWICE A DAY, Disp: 180 tablet, Rfl: 1   rosuvastatin (CRESTOR) 10 MG tablet, TAKE 1 TABLET BY MOUTH EVERY DAY, Disp: 90 tablet, Rfl: 1   Semaglutide,0.25 or 0.5MG /DOS, (OZEMPIC,  0.25 OR 0.5 MG/DOSE,) 2 MG/3ML SOPN, INJECT 0.25 MG WEEKLY FOR 4 WEEKS, THEN INCREASE TO 0.5 MG WEEKLY, Disp: 3 mL, Rfl: 2   spironolactone (ALDACTONE) 25 MG tablet, Take 1  tablet (25 mg total) by mouth daily., Disp: 90 tablet, Rfl: 1   Allergies: No Known Allergies  REVIEW OF SYSTEMS:   Review of Systems  Constitutional:  Negative for chills, fatigue and fever.  HENT:   Negative for lump/mass, mouth sores, nosebleeds, sore throat and trouble swallowing.   Eyes:  Negative for eye problems.  Respiratory:  Negative for cough and shortness of breath.   Cardiovascular:  Negative for chest pain, leg swelling and palpitations.  Gastrointestinal:  Negative for abdominal pain, constipation, diarrhea, nausea and vomiting.  Genitourinary:  Negative for bladder incontinence, difficulty urinating, dysuria, frequency, hematuria and nocturia.   Musculoskeletal:  Negative for arthralgias, back pain, flank pain, myalgias and neck pain.  Skin:  Negative for itching and rash.  Neurological:  Negative for dizziness, headaches and numbness.  Hematological:  Does not bruise/bleed easily.  Psychiatric/Behavioral:  Negative for depression, sleep disturbance and suicidal ideas. The patient is not nervous/anxious.   All other systems reviewed and are negative.    VITALS:   Blood pressure (!) 158/81, pulse 73, temperature (!) 97.5 F (36.4 C), temperature source Oral, resp. rate 16, weight 182 lb 6.4 oz (82.7 kg), SpO2 98%.  Wt Readings from Last 3 Encounters:  10/10/22 182 lb 6.4 oz (82.7 kg)  08/23/22 178 lb (80.7 kg)  08/23/22 178 lb (80.7 kg)    Body mass index is 28.4 kg/m.  Performance status (ECOG): 1 - Symptomatic but completely ambulatory  PHYSICAL EXAM:   Physical Exam Vitals and nursing note reviewed. Exam conducted with a chaperone present.  Constitutional:      Appearance: Normal appearance.  Cardiovascular:     Rate and Rhythm: Normal rate and regular rhythm.     Pulses: Normal pulses.     Heart sounds: Normal heart sounds.  Pulmonary:     Effort: Pulmonary effort is normal.     Breath sounds: Normal breath sounds.  Abdominal:     Palpations:  Abdomen is soft. There is no hepatomegaly, splenomegaly or mass.     Tenderness: There is no abdominal tenderness.  Musculoskeletal:     Right lower leg: No edema.     Left lower leg: No edema.  Lymphadenopathy:     Cervical: No cervical adenopathy.     Right cervical: No superficial, deep or posterior cervical adenopathy.    Left cervical: No superficial, deep or posterior cervical adenopathy.     Upper Body:     Right upper body: No supraclavicular or axillary adenopathy.     Left upper body: No supraclavicular or axillary adenopathy.  Neurological:     General: No focal deficit present.     Mental Status: He is alert and oriented to person, place, and time.  Psychiatric:        Mood and Affect: Mood normal.        Behavior: Behavior normal.     LABS:      Latest Ref Rng & Units 10/03/2022    2:09 PM 06/01/2022    1:04 PM 02/07/2022   12:24 PM  CBC  WBC 4.0 - 10.5 K/uL 5.8  6.6  7.0   Hemoglobin 13.0 - 17.0 g/dL 16.1  09.6  04.5   Hematocrit 39.0 - 52.0 % 40.3  43.1  45.4   Platelets 150 -  400 K/uL 170  156  164       Latest Ref Rng & Units 10/03/2022    2:09 PM 08/23/2022   12:12 PM 06/01/2022    1:04 PM  CMP  Glucose 70 - 99 mg/dL 83  70  99   BUN 8 - 23 mg/dL 28  18  19    Creatinine 0.61 - 1.24 mg/dL 4.09  8.11  9.14   Sodium 135 - 145 mmol/L 140  142  135   Potassium 3.5 - 5.1 mmol/L 3.7  4.1  3.7   Chloride 98 - 111 mmol/L 106  103  102   CO2 22 - 32 mmol/L 24  23  23    Calcium 8.9 - 10.3 mg/dL 8.9  9.1  8.7   Total Protein 6.5 - 8.1 g/dL 7.4   6.9   Total Bilirubin 0.3 - 1.2 mg/dL 0.6   0.5   Alkaline Phos 38 - 126 U/L 54   62   AST 15 - 41 U/L 15   13   ALT 0 - 44 U/L 9   10      No results found for: "CEA1", "CEA" / No results found for: "CEA1", "CEA" Lab Results  Component Value Date   PSA1 <0.1 09/02/2021   No results found for: "NWG956" No results found for: "CAN125"  No results found for: "TOTALPROTELP", "ALBUMINELP", "A1GS", "A2GS", "BETS",  "BETA2SER", "GAMS", "MSPIKE", "SPEI" No results found for: "TIBC", "FERRITIN", "IRONPCTSAT" No results found for: "LDH"   STUDIES:   No results found.

## 2022-10-11 ENCOUNTER — Other Ambulatory Visit: Payer: Self-pay

## 2022-10-11 DIAGNOSIS — C61 Malignant neoplasm of prostate: Secondary | ICD-10-CM

## 2022-10-12 ENCOUNTER — Inpatient Hospital Stay: Payer: Medicare PPO

## 2022-10-12 VITALS — BP 160/90 | HR 65 | Temp 97.2°F | Resp 18

## 2022-10-12 DIAGNOSIS — C61 Malignant neoplasm of prostate: Secondary | ICD-10-CM

## 2022-10-12 DIAGNOSIS — C779 Secondary and unspecified malignant neoplasm of lymph node, unspecified: Secondary | ICD-10-CM | POA: Diagnosis not present

## 2022-10-12 DIAGNOSIS — E785 Hyperlipidemia, unspecified: Secondary | ICD-10-CM | POA: Diagnosis not present

## 2022-10-12 DIAGNOSIS — C7951 Secondary malignant neoplasm of bone: Secondary | ICD-10-CM | POA: Diagnosis not present

## 2022-10-12 DIAGNOSIS — Z7985 Long-term (current) use of injectable non-insulin antidiabetic drugs: Secondary | ICD-10-CM | POA: Diagnosis not present

## 2022-10-12 DIAGNOSIS — Z79818 Long term (current) use of other agents affecting estrogen receptors and estrogen levels: Secondary | ICD-10-CM | POA: Diagnosis not present

## 2022-10-12 DIAGNOSIS — F17210 Nicotine dependence, cigarettes, uncomplicated: Secondary | ICD-10-CM | POA: Diagnosis not present

## 2022-10-12 DIAGNOSIS — Z79899 Other long term (current) drug therapy: Secondary | ICD-10-CM | POA: Diagnosis not present

## 2022-10-12 DIAGNOSIS — I10 Essential (primary) hypertension: Secondary | ICD-10-CM | POA: Diagnosis not present

## 2022-10-12 LAB — COMPREHENSIVE METABOLIC PANEL
ALT: 9 U/L (ref 0–44)
AST: 14 U/L — ABNORMAL LOW (ref 15–41)
Albumin: 3.7 g/dL (ref 3.5–5.0)
Alkaline Phosphatase: 54 U/L (ref 38–126)
Anion gap: 12 (ref 5–15)
BUN: 19 mg/dL (ref 8–23)
CO2: 26 mmol/L (ref 22–32)
Calcium: 9 mg/dL (ref 8.9–10.3)
Chloride: 101 mmol/L (ref 98–111)
Creatinine, Ser: 0.86 mg/dL (ref 0.61–1.24)
GFR, Estimated: >60 mL/min (ref >60)
Glucose, Bld: 103 mg/dL — ABNORMAL HIGH (ref 70–99)
Potassium: 3.3 mmol/L — ABNORMAL LOW (ref 3.5–5.1)
Sodium: 139 mmol/L (ref 135–145)
Total Bilirubin: 0.5 mg/dL (ref 0.3–1.2)
Total Protein: 7.8 g/dL (ref 6.5–8.1)

## 2022-10-12 MED ORDER — DENOSUMAB 120 MG/1.7ML ~~LOC~~ SOLN
120.0000 mg | Freq: Once | SUBCUTANEOUS | Status: AC
  Administered 2022-10-12: 120 mg via SUBCUTANEOUS
  Filled 2022-10-12: qty 1.7

## 2022-10-12 NOTE — Progress Notes (Signed)
Patient presents today for Xgeva injection per provider order.  Vital signs within parameters.  Calcium noted to be 9.0.  Patient is taking Calcium and vitamin D at home has had no jaw pain or prior or upcoming dental work.   Stable during administration without incident; injection site WNL; see MAR for injection details.  Patient tolerated procedure well and without incident.  No questions or complaints noted at this time.

## 2022-10-12 NOTE — Patient Instructions (Signed)
[facility]  Discharge Instructions: Thank you for choosing [facility] to provide your oncology and hematology care.  If you have a lab appointment with the [facility] - please note that after April 8th, 2024, all labs will be drawn in the cancer center.  You do not have to check in or register with the main entrance as you have in the past but will complete your check-in in the cancer center.  Wear comfortable clothing and clothing appropriate for easy access to any Portacath or PICC line.   We strive to give you quality time with your provider. You may need to reschedule your appointment if you arrive late (15 or more minutes).  Arriving late affects you and other patients whose appointments are after yours.  Also, if you miss three or more appointments without notifying the office, you may be dismissed from the clinic at the provider's discretion.      For prescription refill requests, have your pharmacy contact our office and allow 72 hours for refills to be completed.    Today you received the following chemotherapy and/or immunotherapy agents Xgeva      To help prevent nausea and vomiting after your treatment, we encourage you to take your nausea medication as directed.  BELOW ARE SYMPTOMS THAT SHOULD BE REPORTED IMMEDIATELY: *FEVER GREATER THAN 100.4 F (38 C) OR HIGHER *CHILLS OR SWEATING *NAUSEA AND VOMITING THAT IS NOT CONTROLLED WITH YOUR NAUSEA MEDICATION *UNUSUAL SHORTNESS OF BREATH *UNUSUAL BRUISING OR BLEEDING *URINARY PROBLEMS (pain or burning when urinating, or frequent urination) *BOWEL PROBLEMS (unusual diarrhea, constipation, pain near the anus) TENDERNESS IN MOUTH AND THROAT WITH OR WITHOUT PRESENCE OF ULCERS (sore throat, sores in mouth, or a toothache) UNUSUAL RASH, SWELLING OR PAIN  UNUSUAL VAGINAL DISCHARGE OR ITCHING   Items with * indicate a potential emergency and should be followed up as soon as possible or go to the  Emergency Department if any problems should occur.  Please show the CHEMOTHERAPY ALERT CARD or IMMUNOTHERAPY ALERT CARD at check-in to the Emergency Department and triage nurse.  Should you have questions after your visit or need to cancel or reschedule your appointment, please contact [facility] [phone]  and follow the prompts.  Office hours are 8:00 a.m. to 4:30 p.m. Monday - Friday. Please note that voicemails left after 4:00 p.m. may not be returned until the following business day.  We are closed weekends and major holidays. You have access to a nurse at all times for urgent questions. Please call the main number to the clinic [phone] and follow the prompts.  For any non-urgent questions, you may also contact your provider using MyChart. We now offer e-Visits for anyone 67 and older to request care online for non-urgent symptoms. For details visit [email or web address].   Also download the MyChart app! Go to the app store, search "MyChart", open the app, select [facility], and log in with your MyChart username and password.

## 2022-10-18 ENCOUNTER — Other Ambulatory Visit (HOSPITAL_COMMUNITY): Payer: Self-pay

## 2022-10-30 ENCOUNTER — Other Ambulatory Visit (HOSPITAL_COMMUNITY): Payer: Self-pay

## 2022-10-30 ENCOUNTER — Encounter: Payer: Self-pay | Admitting: Oncology

## 2022-10-30 ENCOUNTER — Telehealth: Payer: Self-pay | Admitting: Pharmacy Technician

## 2022-10-30 NOTE — Telephone Encounter (Signed)
Oral Oncology Patient Advocate Encounter  Was successful in securing patient a $8,000 grant from [employer] to provide copayment coverage for xtandi.  This will keep the out of pocket expense at $0.     [employer] ID: [id number]  I have spoken with the patient.   The billing information is as follows and has been shared with [facility].    RxBin: [id number] PCN: [id number] Member ID: [id number] Group ID: [id number] Dates of Eligibility: 10/12/22 through 10/11/23  Fund:  Prostate  Jinger Neighbors, CPhT-Adv Oncology Pharmacy Patient Advocate [facility] Direct Number: [phone]  Fax: [phone]

## 2022-11-07 ENCOUNTER — Inpatient Hospital Stay: Payer: Medicare PPO

## 2022-11-07 VITALS — BP 133/74 | HR 61 | Temp 97.0°F | Resp 18

## 2022-11-07 DIAGNOSIS — C7951 Secondary malignant neoplasm of bone: Secondary | ICD-10-CM | POA: Diagnosis not present

## 2022-11-07 DIAGNOSIS — I10 Essential (primary) hypertension: Secondary | ICD-10-CM | POA: Diagnosis not present

## 2022-11-07 DIAGNOSIS — C61 Malignant neoplasm of prostate: Secondary | ICD-10-CM | POA: Diagnosis not present

## 2022-11-07 DIAGNOSIS — C779 Secondary and unspecified malignant neoplasm of lymph node, unspecified: Secondary | ICD-10-CM | POA: Diagnosis not present

## 2022-11-07 DIAGNOSIS — Z79818 Long term (current) use of other agents affecting estrogen receptors and estrogen levels: Secondary | ICD-10-CM | POA: Diagnosis not present

## 2022-11-07 DIAGNOSIS — Z79899 Other long term (current) drug therapy: Secondary | ICD-10-CM | POA: Diagnosis not present

## 2022-11-07 DIAGNOSIS — F17210 Nicotine dependence, cigarettes, uncomplicated: Secondary | ICD-10-CM | POA: Diagnosis not present

## 2022-11-07 DIAGNOSIS — Z7985 Long-term (current) use of injectable non-insulin antidiabetic drugs: Secondary | ICD-10-CM | POA: Diagnosis not present

## 2022-11-07 DIAGNOSIS — E785 Hyperlipidemia, unspecified: Secondary | ICD-10-CM | POA: Diagnosis not present

## 2022-11-07 LAB — COMPREHENSIVE METABOLIC PANEL
ALT: 9 U/L (ref 0–44)
AST: 14 U/L — ABNORMAL LOW (ref 15–41)
Albumin: 3.5 g/dL (ref 3.5–5.0)
Alkaline Phosphatase: 57 U/L (ref 38–126)
Anion gap: 9 (ref 5–15)
BUN: 24 mg/dL — ABNORMAL HIGH (ref 8–23)
CO2: 27 mmol/L (ref 22–32)
Calcium: 8.9 mg/dL (ref 8.9–10.3)
Chloride: 105 mmol/L (ref 98–111)
Creatinine, Ser: 1.04 mg/dL (ref 0.61–1.24)
GFR, Estimated: >60 mL/min (ref >60)
Glucose, Bld: 83 mg/dL (ref 70–99)
Potassium: 3.7 mmol/L (ref 3.5–5.1)
Sodium: 141 mmol/L (ref 135–145)
Total Bilirubin: 0.4 mg/dL (ref 0.3–1.2)
Total Protein: 7.5 g/dL (ref 6.5–8.1)

## 2022-11-07 MED ORDER — DENOSUMAB 120 MG/1.7ML ~~LOC~~ SOLN
120.0000 mg | Freq: Once | SUBCUTANEOUS | Status: AC
  Administered 2022-11-07: 120 mg via SUBCUTANEOUS
  Filled 2022-11-07: qty 1.7

## 2022-11-07 NOTE — Patient Instructions (Signed)
[facility]  Discharge Instructions: Thank you for choosing [facility] to provide your oncology and hematology care.  If you have a lab appointment with the Cancer Center - please note that after April 8th, 2024, all labs will be drawn in the cancer center.  You do not have to check in or register with the main entrance as you have in the past but will complete your check-in in the cancer center.  Wear comfortable clothing and clothing appropriate for easy access to any Portacath or PICC line.   We strive to give you quality time with your provider. You may need to reschedule your appointment if you arrive late (15 or more minutes).  Arriving late affects you and other patients whose appointments are after yours.  Also, if you miss three or more appointments without notifying the office, you may be dismissed from the clinic at the provider's discretion.      For prescription refill requests, have your pharmacy contact our office and allow 72 hours for refills to be completed.    Today you received the following Xgeva, return as scheduled.   To help prevent nausea and vomiting after your treatment, we encourage you to take your nausea medication as directed.  BELOW ARE SYMPTOMS THAT SHOULD BE REPORTED IMMEDIATELY: *FEVER GREATER THAN 100.4 F (38 C) OR HIGHER *CHILLS OR SWEATING *NAUSEA AND VOMITING THAT IS NOT CONTROLLED WITH YOUR NAUSEA MEDICATION *UNUSUAL SHORTNESS OF BREATH *UNUSUAL BRUISING OR BLEEDING *URINARY PROBLEMS (pain or burning when urinating, or frequent urination) *BOWEL PROBLEMS (unusual diarrhea, constipation, pain near the anus) TENDERNESS IN MOUTH AND THROAT WITH OR WITHOUT PRESENCE OF ULCERS (sore throat, sores in mouth, or a toothache) UNUSUAL RASH, SWELLING OR PAIN  UNUSUAL VAGINAL DISCHARGE OR ITCHING   Items with * indicate a potential emergency and should be followed up as soon as possible or go to the Emergency Department if any  problems should occur.  Please show the CHEMOTHERAPY ALERT CARD or IMMUNOTHERAPY ALERT CARD at check-in to the Emergency Department and triage nurse.  Should you have questions after your visit or need to cancel or reschedule your appointment, please contact [facility] [phone]  and follow the prompts.  Office hours are 8:00 a.m. to 4:30 p.m. Monday - Friday. Please note that voicemails left after 4:00 p.m. may not be returned until the following business day.  We are closed weekends and major holidays. You have access to a nurse at all times for urgent questions. Please call the main number to the clinic [phone] and follow the prompts.  For any non-urgent questions, you may also contact your provider using MyChart. We now offer e-Visits for anyone 70 and older to request care online for non-urgent symptoms. For details visit [email or web address].   Also download the MyChart app! Go to the app store, search "MyChart", open the app, select [facility], and log in with your MyChart username and password.

## 2022-11-07 NOTE — Progress Notes (Addendum)
Patient taking calcium as directed. Denied tooth, jaw, and leg pain. No recent or upcoming dental visits. Labs reviewed. Patient tolerated injection with no complaints voiced. See MAR for details. Patient stable during and after injection. Site clean and dry with no bruising or swelling noted. Band aid applied. Vss with discharge and left in satisfactory condition with no s/s of distress.  

## 2022-11-13 ENCOUNTER — Other Ambulatory Visit: Payer: Self-pay

## 2022-11-15 ENCOUNTER — Other Ambulatory Visit: Payer: Self-pay

## 2022-11-15 NOTE — Progress Notes (Signed)
Specialty Pharmacy Ongoing Clinical Assessment Note  Kyle George is a 72 y.o. male who is being followed by the specialty pharmacy service for RxSp Oncology   Patient's specialty medication(s) reviewed today: Enzalutamide   Missed doses in the last 4 weeks: 0   Patient/Caregiver did not have any additional questions or concerns.   Therapeutic benefit summary: Patient is achieving benefit   Adverse events/side effects summary: No adverse events/side effects   Patient's therapy is appropriate to: Continue    Goals Addressed             This Visit's Progress    Slow Disease Progression       Patient is on track. Patient will maintain adherence         Follow up:  6 months  [name] Specialty Pharmacist

## 2022-11-15 NOTE — Progress Notes (Signed)
Specialty Pharmacy Refill Coordination Note  Kyle George is a 72 y.o. male contacted today regarding refills of specialty medication(s) Enzalutamide   Patient requested Delivery   Delivery date: 11/21/22   Verified address: [address] [place], [place] [address]   Medication will be filled on 11/20/2022.

## 2022-12-05 ENCOUNTER — Inpatient Hospital Stay: Payer: Medicare PPO | Attending: Nurse Practitioner

## 2022-12-05 ENCOUNTER — Inpatient Hospital Stay: Payer: Medicare PPO

## 2022-12-05 VITALS — BP 127/67 | HR 71

## 2022-12-05 DIAGNOSIS — C7951 Secondary malignant neoplasm of bone: Secondary | ICD-10-CM | POA: Diagnosis not present

## 2022-12-05 DIAGNOSIS — Z7984 Long term (current) use of oral hypoglycemic drugs: Secondary | ICD-10-CM | POA: Insufficient documentation

## 2022-12-05 DIAGNOSIS — Z7985 Long-term (current) use of injectable non-insulin antidiabetic drugs: Secondary | ICD-10-CM | POA: Diagnosis not present

## 2022-12-05 DIAGNOSIS — F17210 Nicotine dependence, cigarettes, uncomplicated: Secondary | ICD-10-CM | POA: Diagnosis not present

## 2022-12-05 DIAGNOSIS — I10 Essential (primary) hypertension: Secondary | ICD-10-CM | POA: Insufficient documentation

## 2022-12-05 DIAGNOSIS — C61 Malignant neoplasm of prostate: Secondary | ICD-10-CM

## 2022-12-05 DIAGNOSIS — C779 Secondary and unspecified malignant neoplasm of lymph node, unspecified: Secondary | ICD-10-CM | POA: Diagnosis not present

## 2022-12-05 DIAGNOSIS — E785 Hyperlipidemia, unspecified: Secondary | ICD-10-CM | POA: Insufficient documentation

## 2022-12-05 DIAGNOSIS — Z7982 Long term (current) use of aspirin: Secondary | ICD-10-CM | POA: Insufficient documentation

## 2022-12-05 DIAGNOSIS — Z809 Family history of malignant neoplasm, unspecified: Secondary | ICD-10-CM | POA: Diagnosis not present

## 2022-12-05 DIAGNOSIS — Z79899 Other long term (current) drug therapy: Secondary | ICD-10-CM | POA: Diagnosis not present

## 2022-12-05 DIAGNOSIS — Z79818 Long term (current) use of other agents affecting estrogen receptors and estrogen levels: Secondary | ICD-10-CM | POA: Insufficient documentation

## 2022-12-05 LAB — COMPREHENSIVE METABOLIC PANEL
ALT: 9 U/L (ref 0–44)
AST: 14 U/L — ABNORMAL LOW (ref 15–41)
Albumin: 3.3 g/dL — ABNORMAL LOW (ref 3.5–5.0)
Alkaline Phosphatase: 43 U/L (ref 38–126)
Anion gap: 11 (ref 5–15)
BUN: 24 mg/dL — ABNORMAL HIGH (ref 8–23)
CO2: 24 mmol/L (ref 22–32)
Calcium: 8.4 mg/dL — ABNORMAL LOW (ref 8.9–10.3)
Chloride: 103 mmol/L (ref 98–111)
Creatinine, Ser: 0.98 mg/dL (ref 0.61–1.24)
GFR, Estimated: >60 mL/min (ref >60)
Glucose, Bld: 124 mg/dL — ABNORMAL HIGH (ref 70–99)
Potassium: 3.5 mmol/L (ref 3.5–5.1)
Sodium: 138 mmol/L (ref 135–145)
Total Bilirubin: 0.8 mg/dL (ref <1.2)
Total Protein: 7 g/dL (ref 6.5–8.1)

## 2022-12-05 MED ORDER — DENOSUMAB 120 MG/1.7ML ~~LOC~~ SOLN
120.0000 mg | Freq: Once | SUBCUTANEOUS | Status: AC
  Administered 2022-12-05: 120 mg via SUBCUTANEOUS
  Filled 2022-12-05: qty 1.7

## 2022-12-05 NOTE — Progress Notes (Signed)
Patient calcium 8.4. Patient tolerated Xgeva injection with no complaints voiced.  Site clean and dry with no bruising or swelling noted at site.  See MAR for details.  Band aid applied.  Patient stable during and after injection.  Vss with discharge and left in satisfactory condition with no s/s of distress noted. All follow ups as scheduled.   Kam Rahimi [employer]

## 2022-12-06 ENCOUNTER — Other Ambulatory Visit: Payer: Self-pay | Admitting: Nurse Practitioner

## 2022-12-14 ENCOUNTER — Other Ambulatory Visit: Payer: Self-pay

## 2022-12-14 NOTE — Progress Notes (Signed)
Specialty Pharmacy Refill Coordination Note  Kyle George is a 72 y.o. male contacted today regarding refills of specialty medication(s) Enzalutamide   Patient requested Delivery   Delivery date: 12/22/22   Verified address: [address] [place], [place] [address]   Medication will be filled on 12/21/22.

## 2023-01-02 ENCOUNTER — Inpatient Hospital Stay: Payer: Medicare PPO

## 2023-01-02 ENCOUNTER — Inpatient Hospital Stay: Payer: Medicare PPO | Attending: Nurse Practitioner

## 2023-01-02 VITALS — BP 151/80 | HR 63 | Temp 97.6°F | Resp 18

## 2023-01-02 DIAGNOSIS — C61 Malignant neoplasm of prostate: Secondary | ICD-10-CM

## 2023-01-02 DIAGNOSIS — C7951 Secondary malignant neoplasm of bone: Secondary | ICD-10-CM | POA: Insufficient documentation

## 2023-01-02 DIAGNOSIS — Z79899 Other long term (current) drug therapy: Secondary | ICD-10-CM | POA: Insufficient documentation

## 2023-01-02 LAB — COMPREHENSIVE METABOLIC PANEL
ALT: 8 U/L (ref 0–44)
AST: 14 U/L — ABNORMAL LOW (ref 15–41)
Albumin: 3.5 g/dL (ref 3.5–5.0)
Alkaline Phosphatase: 39 U/L (ref 38–126)
Anion gap: 12 (ref 5–15)
BUN: 37 mg/dL — ABNORMAL HIGH (ref 8–23)
CO2: 21 mmol/L — ABNORMAL LOW (ref 22–32)
Calcium: 9.4 mg/dL (ref 8.9–10.3)
Chloride: 103 mmol/L (ref 98–111)
Creatinine, Ser: 1.03 mg/dL (ref 0.61–1.24)
GFR, Estimated: >60 mL/min (ref >60)
Glucose, Bld: 84 mg/dL (ref 70–99)
Potassium: 4.1 mmol/L (ref 3.5–5.1)
Sodium: 136 mmol/L (ref 135–145)
Total Bilirubin: 0.4 mg/dL (ref <1.2)
Total Protein: 7.4 g/dL (ref 6.5–8.1)

## 2023-01-02 MED ORDER — DENOSUMAB 120 MG/1.7ML ~~LOC~~ SOLN
120.0000 mg | Freq: Once | SUBCUTANEOUS | Status: AC
  Administered 2023-01-02: 120 mg via SUBCUTANEOUS
  Filled 2023-01-02: qty 1.7

## 2023-01-02 NOTE — Patient Instructions (Signed)
[facility] - A DEPT OF [facility]  Discharge Instructions: Thank you for choosing [facility] to provide your oncology and hematology care.  If you have a lab appointment with the Cancer Center - please note that after April 8th, 2024, all labs will be drawn in the cancer center.  You do not have to check in or register with the main entrance as you have in the past but will complete your check-in in the cancer center.  Wear comfortable clothing and clothing appropriate for easy access to any Portacath or PICC line.   We strive to give you quality time with your provider. You may need to reschedule your appointment if you arrive late (15 or more minutes).  Arriving late affects you and other patients whose appointments are after yours.  Also, if you miss three or more appointments without notifying the office, you may be dismissed from the clinic at the provider's discretion.      For prescription refill requests, have your pharmacy contact our office and allow 72 hours for refills to be completed.    Today you received the following  Xgeva, return as scheduled.   To help prevent nausea and vomiting after your treatment, we encourage you to take your nausea medication as directed.  BELOW ARE SYMPTOMS THAT SHOULD BE REPORTED IMMEDIATELY: *FEVER GREATER THAN 100.4 F (38 C) OR HIGHER *CHILLS OR SWEATING *NAUSEA AND VOMITING THAT IS NOT CONTROLLED WITH YOUR NAUSEA MEDICATION *UNUSUAL SHORTNESS OF BREATH *UNUSUAL BRUISING OR BLEEDING *URINARY PROBLEMS (pain or burning when urinating, or frequent urination) *BOWEL PROBLEMS (unusual diarrhea, constipation, pain near the anus) TENDERNESS IN MOUTH AND THROAT WITH OR WITHOUT PRESENCE OF ULCERS (sore throat, sores in mouth, or a toothache) UNUSUAL RASH, SWELLING OR PAIN  UNUSUAL VAGINAL DISCHARGE OR ITCHING   Items with * indicate a potential emergency and should be followed up as soon as possible or  go to the Emergency Department if any problems should occur.  Please show the CHEMOTHERAPY ALERT CARD or IMMUNOTHERAPY ALERT CARD at check-in to the Emergency Department and triage nurse.  Should you have questions after your visit or need to cancel or reschedule your appointment, please contact [facility] [place] - A DEPT OF [facility] [phone]  and follow the prompts.  Office hours are 8:00 a.m. to 4:30 p.m. Monday - Friday. Please note that voicemails left after 4:00 p.m. may not be returned until the following business day.  We are closed weekends and major holidays. You have access to a nurse at all times for urgent questions. Please call the main number to the clinic [phone] and follow the prompts.  For any non-urgent questions, you may also contact your provider using MyChart. We now offer e-Visits for anyone 56 and older to request care online for non-urgent symptoms. For details visit [email or web address].   Also download the MyChart app! Go to the app store, search "MyChart", open the app, select [facility], and log in with your MyChart username and password.

## 2023-01-02 NOTE — Progress Notes (Signed)
Patient taking calcium as directed. Denied tooth, jaw, and leg pain. No recent or upcoming dental visits. Labs reviewed. Patient tolerated injection with no complaints voiced. See MAR for details. Patient stable during and after injection. Site clean and dry with no bruising or swelling noted. Band aid applied. Vss with discharge and left in satisfactory condition with no s/s of distress.  

## 2023-01-06 ENCOUNTER — Other Ambulatory Visit: Payer: Self-pay | Admitting: Nurse Practitioner

## 2023-01-12 ENCOUNTER — Other Ambulatory Visit: Payer: Self-pay | Admitting: Nurse Practitioner

## 2023-01-12 DIAGNOSIS — C61 Malignant neoplasm of prostate: Secondary | ICD-10-CM

## 2023-01-12 DIAGNOSIS — I10 Essential (primary) hypertension: Secondary | ICD-10-CM

## 2023-01-17 ENCOUNTER — Other Ambulatory Visit: Payer: Self-pay

## 2023-01-17 ENCOUNTER — Other Ambulatory Visit: Payer: Self-pay | Admitting: Hematology

## 2023-01-17 ENCOUNTER — Other Ambulatory Visit (HOSPITAL_COMMUNITY): Payer: Self-pay

## 2023-01-17 DIAGNOSIS — C61 Malignant neoplasm of prostate: Secondary | ICD-10-CM

## 2023-01-17 MED ORDER — ENZALUTAMIDE 40 MG PO TABS
ORAL_TABLET | ORAL | 2 refills | Status: DC
  Filled 2023-01-17: qty 120, 30d supply, fill #0
  Filled 2023-02-09: qty 120, 30d supply, fill #1
  Filled 2023-03-19: qty 120, 30d supply, fill #2

## 2023-01-17 NOTE — Progress Notes (Signed)
 Specialty Pharmacy Refill Coordination Note  Kyle George is a 73 y.o. male contacted today regarding refills of specialty medication(s) Enzalutamide  (XTANDI )   Patient requested Delivery   Delivery date: 01/22/23   Verified address: [address]   [facility] [address]   Medication will be filled on 01/19/23.   Pending refill request

## 2023-01-18 ENCOUNTER — Other Ambulatory Visit: Payer: Self-pay | Admitting: Nurse Practitioner

## 2023-01-18 ENCOUNTER — Other Ambulatory Visit: Payer: Self-pay

## 2023-01-18 DIAGNOSIS — E782 Mixed hyperlipidemia: Secondary | ICD-10-CM

## 2023-01-18 NOTE — Progress Notes (Signed)
 Patient was contacted today regarding specialty medication, due to upcoming weather conditions medications will be filled 01/18/23 and will be delivered 01/19/23

## 2023-01-30 ENCOUNTER — Inpatient Hospital Stay: Payer: Medicare PPO

## 2023-01-30 ENCOUNTER — Inpatient Hospital Stay: Payer: Medicare PPO | Attending: Nurse Practitioner | Admitting: Hematology

## 2023-01-30 VITALS — BP 152/85 | HR 58 | Temp 97.1°F | Resp 16 | Wt 176.6 lb

## 2023-01-30 DIAGNOSIS — C7951 Secondary malignant neoplasm of bone: Secondary | ICD-10-CM | POA: Insufficient documentation

## 2023-01-30 DIAGNOSIS — Z809 Family history of malignant neoplasm, unspecified: Secondary | ICD-10-CM | POA: Diagnosis not present

## 2023-01-30 DIAGNOSIS — Z7984 Long term (current) use of oral hypoglycemic drugs: Secondary | ICD-10-CM | POA: Insufficient documentation

## 2023-01-30 DIAGNOSIS — Z7985 Long-term (current) use of injectable non-insulin antidiabetic drugs: Secondary | ICD-10-CM | POA: Diagnosis not present

## 2023-01-30 DIAGNOSIS — Z79818 Long term (current) use of other agents affecting estrogen receptors and estrogen levels: Secondary | ICD-10-CM | POA: Insufficient documentation

## 2023-01-30 DIAGNOSIS — C61 Malignant neoplasm of prostate: Secondary | ICD-10-CM | POA: Diagnosis not present

## 2023-01-30 DIAGNOSIS — E785 Hyperlipidemia, unspecified: Secondary | ICD-10-CM | POA: Diagnosis not present

## 2023-01-30 DIAGNOSIS — Z87891 Personal history of nicotine dependence: Secondary | ICD-10-CM | POA: Diagnosis not present

## 2023-01-30 DIAGNOSIS — Z79899 Other long term (current) drug therapy: Secondary | ICD-10-CM | POA: Diagnosis not present

## 2023-01-30 DIAGNOSIS — Z192 Hormone resistant malignancy status: Secondary | ICD-10-CM | POA: Diagnosis not present

## 2023-01-30 DIAGNOSIS — Z7982 Long term (current) use of aspirin: Secondary | ICD-10-CM | POA: Diagnosis not present

## 2023-01-30 DIAGNOSIS — I10 Essential (primary) hypertension: Secondary | ICD-10-CM | POA: Insufficient documentation

## 2023-01-30 LAB — COMPREHENSIVE METABOLIC PANEL
ALT: 8 U/L (ref 0–44)
AST: 15 U/L (ref 15–41)
Albumin: 3.5 g/dL (ref 3.5–5.0)
Alkaline Phosphatase: 47 U/L (ref 38–126)
Anion gap: 11 (ref 5–15)
BUN: 20 mg/dL (ref 8–23)
CO2: 25 mmol/L (ref 22–32)
Calcium: 9.4 mg/dL (ref 8.9–10.3)
Chloride: 102 mmol/L (ref 98–111)
Creatinine, Ser: 0.93 mg/dL (ref 0.61–1.24)
GFR, Estimated: >60 mL/min (ref >60)
Glucose, Bld: 67 mg/dL — ABNORMAL LOW (ref 70–99)
Potassium: 3.8 mmol/L (ref 3.5–5.1)
Sodium: 138 mmol/L (ref 135–145)
Total Bilirubin: 0.3 mg/dL (ref 0.0–1.2)
Total Protein: 7.4 g/dL (ref 6.5–8.1)

## 2023-01-30 LAB — CBC WITH DIFFERENTIAL/PLATELET
Abs Immature Granulocytes: 0.01 10*3/uL (ref 0.00–0.07)
Basophils Absolute: 0 10*3/uL (ref 0.0–0.1)
Basophils Relative: 1 %
Eosinophils Absolute: 0.3 10*3/uL (ref 0.0–0.5)
Eosinophils Relative: 5 %
HCT: 44.5 % (ref 39.0–52.0)
Hemoglobin: 14 g/dL (ref 13.0–17.0)
Immature Granulocytes: 0 %
Lymphocytes Relative: 28 %
Lymphs Abs: 1.7 10*3/uL (ref 0.7–4.0)
MCH: 33.4 pg (ref 26.0–34.0)
MCHC: 31.5 g/dL (ref 30.0–36.0)
MCV: 106.2 fL — ABNORMAL HIGH (ref 80.0–100.0)
Monocytes Absolute: 0.3 10*3/uL (ref 0.1–1.0)
Monocytes Relative: 5 %
Neutro Abs: 3.7 10*3/uL (ref 1.7–7.7)
Neutrophils Relative %: 61 %
Platelets: 173 10*3/uL (ref 150–400)
RBC: 4.19 MIL/uL — ABNORMAL LOW (ref 4.22–5.81)
RDW: 14.8 % (ref 11.5–15.5)
WBC: 6.1 10*3/uL (ref 4.0–10.5)
nRBC: 0 % (ref 0.0–0.2)

## 2023-01-30 LAB — VITAMIN D 25 HYDROXY (VIT D DEFICIENCY, FRACTURES): Vit D, 25-Hydroxy: 74.28 ng/mL (ref 30–100)

## 2023-01-30 LAB — PSA: Prostatic Specific Antigen: <0.01 ng/mL (ref 0.00–4.00)

## 2023-01-30 MED ORDER — LEUPROLIDE ACETATE (4 MONTH) 30 MG ~~LOC~~ KIT
30.0000 mg | PACK | Freq: Once | SUBCUTANEOUS | Status: AC
  Administered 2023-01-30: 30 mg via SUBCUTANEOUS
  Filled 2023-01-30: qty 30

## 2023-01-30 MED ORDER — DENOSUMAB 120 MG/1.7ML ~~LOC~~ SOLN
120.0000 mg | Freq: Once | SUBCUTANEOUS | Status: AC
Start: 2023-01-30 — End: 2023-01-30
  Administered 2023-01-30: 120 mg via SUBCUTANEOUS
  Filled 2023-01-30: qty 1.7

## 2023-01-30 NOTE — Progress Notes (Signed)
Patient presents today for Eligard and Xgeva, patient's glucose 67. Patient asymptomatic reports not eating yet today. Patient given apple juice and oatmeal cookie to eat in clinic.  Patient taking calcium as directed. Denied tooth, jaw, and leg pain. No recent or upcoming dental visits. Labs reviewed. Patient tolerated injection with no complaints voiced. See MAR for details. Patient stable during and after injection. Site clean and dry with no bruising or swelling noted. Band aid applied. Vss with discharge and left in satisfactory condition with no s/s of distress.

## 2023-01-30 NOTE — Patient Instructions (Signed)
[facility] Discharge Instructions   You were seen and examined today by Dr. Ellin Saba.  He reviewed the results of your lab work which are normal/stable.   Continue Xtandi as prescribed.   We will see you back in 4 months. We will repeat lab work at that time.   Return as scheduled.    Thank you for choosing  Cancer Center at [facility] to provide your oncology and hematology care.  To afford each patient quality time with our provider, please arrive at least 15 minutes before your scheduled appointment time.   If you have a lab appointment with the Cancer Center please come in thru the Main Entrance and check in at the main information desk.  You need to re-schedule your appointment should you arrive 10 or more minutes late.  We strive to give you quality time with our providers, and arriving late affects you and other patients whose appointments are after yours.  Also, if you no show three or more times for appointments you may be dismissed from the clinic at the providers discretion.     Again, thank you for choosing [facility].  Our hope is that these requests will decrease the amount of time that you wait before being seen by our physicians.       _____________________________________________________________  Should you have questions after your visit to [facility], please contact our office at [phone] and follow the prompts.  Our office hours are 8:00 a.m. and 4:30 p.m. Monday - Friday.  Please note that voicemails left after 4:00 p.m. may not be returned until the following business day.  We are closed weekends and major holidays.  You do have access to a nurse 24-7, just call the main number to the clinic [phone] and do not press any options, hold on the line and a nurse will answer the phone.    For prescription refill requests, have your pharmacy contact our office and allow 72 hours.     Due to Covid, you will need to wear a mask upon entering the hospital. If you do not have a mask, a mask will be given to you at the Main Entrance upon arrival. For doctor visits, patients may have 1 support person age 74 or older with them. For treatment visits, patients can not have anyone with them due to social distancing guidelines and our immunocompromised population.

## 2023-01-30 NOTE — Progress Notes (Signed)
[facility] [address], [place] [address]    Clinic Day:  01/30/2023  Referring physician: Arnette Felts, [name]  Patient Care Team: Kyle Felts, [name] as PCP - General (General Practice) Kyle Massed, MD as Medical Oncologist (Medical Oncology)   ASSESSMENT & PLAN:   Assessment: 1.  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 - CT CAP (03/24/2020): Mildly improved retroperitoneal and left pelvic sidewall adenopathy but with new and substantially increased sclerosis metastatic lesions.  Upper normal partially calcified subcarinal lymph nodes. - Xtandi 160 mg daily started on 03/01/2020.   2.  Social/family history: - Lives at home with his wife.  Independent of ADLs and IADLs.  Does gardening.  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 - Current active smoker, 3 to 4 cigarettes/day.  Smoked 1 pack/day for more than 30 years. - Father and paternal uncle had cancers, type not known to the patient.    Plan: 1.  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 - He lost about 30 pounds since he was started on Ozempic.  He reports decreased appetite.  Otherwise no GI adverse effects noted. - Reviewed labs today: Normal LFTs and creatinine.  CBC was normal.  Last PSA was undetectable.  Will follow-up on PSA from today. - He may proceed with Eligard 30 mg today.  Continue enzalutamide 160 mg daily. - RTC 4 months with repeat PSA levels.   2.  Metastatic bone  disease: - Denosumab started on 10/12/2022. - He does not report any adverse effects.  Calcium today is 9.4 with almond 3.5. - Proceed with denosumab today and monthly.  Orders Placed This Encounter  Procedures   CBC with Differential    Standing Status:   Future    Expected Date:   05/30/2023    Expiration Date:   01/30/2024   Comprehensive metabolic panel    Standing Status:   Future    Expected Date:   05/30/2023    Expiration Date:   01/30/2024   PSA    Standing Status:   Future    Expected Date:   05/30/2023    Expiration Date:   01/30/2024      Kyle George R Teague,acting as a scribe for Kyle Massed, MD.,have documented all relevant documentation on the behalf of Kyle Massed, MD,as directed by  Kyle Massed, MD while in the presence of Kyle Massed, MD.  I, Kyle Massed MD, have reviewed the above documentation for accuracy and completeness, and I agree with the above.     Kyle Massed, MD   1/21/20252:31 PM  CHIEF COMPLAINT:   Diagnosis: metastatic prostate cancer   Cancer Staging  Secondary malignant neoplasm of bone [facility]) Staging form: Bone - Appendicular Skeleton, Trunk, Skull, and Facial Bones, AJCC 8th Edition - Clinical: Stage IVB (cTX, cNX, pM1b) - Signed by Benjiman Core, MD on 04/29/2020    Prior Therapy: 1. ADT Deborra Medina, Lupron) 11/2011 - 12/2013 2. abiraterone 08/2016 - 01/2020  Current Therapy:  [name]; ADT (Eligard)   HISTORY  OF PRESENT ILLNESS:   Oncology History  Secondary malignant neoplasm of bone (HCC)  11/25/2019 Initial Diagnosis   Secondary malignant neoplasm of bone (HCC)   04/29/2020 Cancer Staging   Staging form: Bone - Appendicular Skeleton, Trunk, Skull, and Facial Bones, AJCC 8th Edition - Clinical: Stage IVB (cTX, cNX, pM1b) - Signed by Benjiman Core, MD on 04/29/2020      INTERVAL HISTORY:   Kyle George is a 73 y.o. male presenting to clinic today for follow up of metastatic prostate  cancer. He was last seen by me on 10/10/22.  Today, he states that he is doing well overall. His appetite level is at 60%. His energy level is at 80%. He is tolerating Xtandi well and denies any hot flashes, new onset pains, jaw pains, or dental issues. He notes he is taking Ozempic and has a decreased appetite. He has lost 30 pounds since starting Ozempic 6-8 months ago.  He denies any side effects from white cell booster shot, including flu-like symptoms.   PAST MEDICAL HISTORY:   Past Medical History: Past Medical History:  Diagnosis Date   Cancer of prostate (HCC)    Diabetes mellitus without complication (HCC)    Hyperlipidemia    Hypertension     Surgical History: Past Surgical History:  Procedure Laterality Date   COLONOSCOPY     HEMORRHOID SURGERY      Social History: Social History   Socioeconomic History   Marital status: Married    Spouse name: Not on file   Number of children: Not on file   Years of education: Not on file   Highest education level: Not on file  Occupational History   Not on file  Tobacco Use   Smoking status: Some Days    Current packs/day: 0.25    Average packs/day: 0.3 packs/day for 50.0 years (12.5 ttl pk-yrs)    Types: Cigarettes   Smokeless tobacco: Former    Types: Chew    Quit date: 1975   Tobacco comments:    smoked about 1/2 PPD for about 10 years; now at 4-5 cigarettes per day   Vaping Use   Vaping status: Never Used  Substance and Sexual Activity   Alcohol use: Yes    Comment: occassionally   Drug use: Never   Sexual activity: Yes  Other Topics Concern   Not on file  Social History Narrative   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   Social Drivers of Corporate investment banker Strain: Low Risk  (08/23/2022)   Overall Financial Resource Strain (CARDIA)    Difficulty of Paying Living Expenses: Not hard at all  Food Insecurity: No Food Insecurity (08/23/2022)   Hunger Vital Sign    Worried  About Running Out of Food in the Last Year: Never true    Ran Out of Food in the Last Year: Never true  Transportation Needs: No Transportation Needs (08/23/2022)   PRAPARE - Administrator, Civil Service (Medical): No    Lack of Transportation (Non-Medical): No  Physical Activity: Sufficiently Active (08/23/2022)   Exercise Vital Sign    Days of Exercise per Week: 6 days    Minutes of Exercise per Session: 90 min  Stress: No Stress Concern Present (08/23/2022)   [employer] of Occupational Health - Occupational Stress Questionnaire    Feeling of Stress : Not at all  Social Connections: Moderately Integrated (08/23/2022)   Social Connection and Isolation Panel [NHANES]  Frequency of Communication with Friends and Family: More than three times a week    Frequency of Social Gatherings with Friends and Family: Once a week    Attends Religious Services: More than 4 times per year    Active Member of [employer] or Organizations: No    Attends Banker Meetings: Never    Marital Status: Married  Catering manager Violence: Not At Risk (08/23/2022)   Humiliation, Afraid, Rape, and Kick questionnaire    Fear of Current or Ex-Partner: No    Emotionally Abused: No    Physically Abused: No    Sexually Abused: No    Family History: Family History  Problem Relation Age of Onset   Hypertension Mother    Diabetes Mother    Cancer Father        He is unsure of what type of cancer but it was not prostate cancer   Cancer Paternal Uncle        Pt is unsure of what type of cancer   Colon cancer Neg Hx    Esophageal cancer Neg Hx    Stomach cancer Neg Hx    Rectal cancer Neg Hx     Current Medications:  Current Outpatient Medications:    aspirin 81 MG tablet, Take 81 mg by mouth daily., Disp: , Rfl:    blood glucose meter kit and supplies KIT, Dispense based on patient and insurance preference. Use up to four times daily as directed., Disp: 1 each, Rfl: 0   Blood  Pressure Monitoring (BLOOD PRESSURE CUFF) MISC, by Does not apply route. Check blood pressure at least 4 times per week 3 hours after taking medication, Disp: , Rfl:    Calcium Carbonate-Vitamin D (CALCIUM 600+D PO), Take 1 tablet by mouth daily. Taking calcium + D3, Disp: , Rfl:    carvedilol (COREG) 6.25 MG tablet, TAKE 1 TABLET BY MOUTH TWICE A DAY WITH FOOD, Disp: 180 tablet, Rfl: 1   cholecalciferol (VITAMIN D) 1000 UNITS tablet, Take 5,000 Units by mouth daily., Disp: , Rfl:    enzalutamide (XTANDI) 40 MG tablet, TAKE 4 TABLETS (160 MG TOTAL) BY MOUTH DAILY., Disp: 120 tablet, Rfl: 2   glucose blood test strip, 1 each by Other route as needed for other. Use as instructed to check blood sugars, Disp: , Rfl:    Lancets (ONETOUCH DELICA PLUS LANCET33G) MISC, by Does not apply route. Use as directed to check blood  Sugars 2 times per day dx E11.65, Disp: , Rfl:    Multiple Vitamin (MULTIVITAMIN WITH MINERALS) TABS, Take 1 tablet by mouth daily., Disp: , Rfl:    NON FORMULARY, Take 1 tablet by mouth daily. Thyroid  action, Disp: , Rfl:    Olmesartan-amLODIPine-HCTZ 40-10-25 MG TABS, TAKE 1 TABLET BY MOUTH EVERY DAY, Disp: 90 tablet, Rfl: 1   Omega-3 Fatty Acids (FISH OIL PO), Take 1 tablet by mouth once., Disp: , Rfl:    pioglitazone-metformin (ACTOPLUS MET) 15-850 MG tablet, TAKE 1 TABLET BY MOUTH TWICE A DAY, Disp: 180 tablet, Rfl: 1   rosuvastatin (CRESTOR) 10 MG tablet, TAKE 1 TABLET BY MOUTH EVERY DAY, Disp: 90 tablet, Rfl: 1   Semaglutide,0.25 or 0.5MG /DOS, (OZEMPIC, 0.25 OR 0.5 MG/DOSE,) 2 MG/3ML SOPN, INJECT 0.25 MG WEEKLY FOR 4 WEEKS, THEN INCREASE TO 0.5 MG WEEKLY, Disp: 3 mL, Rfl: 2   spironolactone (ALDACTONE) 25 MG tablet, TAKE 1 TABLET (25 MG TOTAL) BY MOUTH DAILY., Disp: 90 tablet, Rfl: 1 No current facility-administered medications for this  visit.  Facility-Administered Medications Ordered in Other Visits:    denosumab (XGEVA) injection 120 mg, 120 mg, Subcutaneous, Once,  Kyle Massed, MD   Leuprolide Acetate (4 Month) (ELIGARD) injection 30 mg, 30 mg, Subcutaneous, Once, Kyle Massed, MD   Allergies: No Known Allergies  REVIEW OF SYSTEMS:   Review of Systems  Constitutional:  Negative for chills, fatigue and fever.  HENT:   Negative for lump/mass, mouth sores, nosebleeds, sore throat and trouble swallowing.   Eyes:  Negative for eye problems.  Respiratory:  Negative for cough and shortness of breath.   Cardiovascular:  Negative for chest pain, leg swelling and palpitations.  Gastrointestinal:  Negative for abdominal pain, constipation, diarrhea, nausea and vomiting.  Genitourinary:  Negative for bladder incontinence, difficulty urinating, dysuria, frequency, hematuria and nocturia.   Musculoskeletal:  Negative for arthralgias, back pain, flank pain, myalgias and neck pain.  Skin:  Negative for itching and rash.  Neurological:  Negative for dizziness, headaches and numbness.  Hematological:  Does not bruise/bleed easily.  Psychiatric/Behavioral:  Negative for depression, sleep disturbance and suicidal ideas. The patient is not nervous/anxious.   All other systems reviewed and are negative.    VITALS:   Blood pressure (!) 152/85, pulse (!) 58, temperature (!) 97.1 F (36.2 C), temperature source Tympanic, resp. rate 16, weight 176 lb 9.4 oz (80.1 kg), SpO2 97%.  Wt Readings from Last 3 Encounters:  01/30/23 176 lb 9.4 oz (80.1 kg)  10/10/22 182 lb 6.4 oz (82.7 kg)  08/23/22 178 lb (80.7 kg)    Body mass index is 27.49 kg/m.  Performance status (ECOG): 1 - Symptomatic but completely ambulatory  PHYSICAL EXAM:   Physical Exam Vitals and nursing note reviewed. Exam conducted with a chaperone present.  Constitutional:      Appearance: Normal appearance.  Cardiovascular:     Rate and Rhythm: Normal rate and regular rhythm.     Pulses: Normal pulses.     Heart sounds: Normal heart sounds.  Pulmonary:     Effort: Pulmonary  effort is normal.     Breath sounds: Normal breath sounds.  Abdominal:     Palpations: Abdomen is soft. There is no hepatomegaly, splenomegaly or mass.     Tenderness: There is no abdominal tenderness.  Musculoskeletal:     Right lower leg: No edema.     Left lower leg: No edema.  Lymphadenopathy:     Cervical: No cervical adenopathy.     Right cervical: No superficial, deep or posterior cervical adenopathy.    Left cervical: No superficial, deep or posterior cervical adenopathy.     Upper Body:     Right upper body: No supraclavicular or axillary adenopathy.     Left upper body: No supraclavicular or axillary adenopathy.  Neurological:     General: No focal deficit present.     Mental Status: He is alert and oriented to person, place, and time.  Psychiatric:        Mood and Affect: Mood normal.        Behavior: Behavior normal.     LABS:      Latest Ref Rng & Units 01/30/2023    1:21 PM 10/03/2022    2:09 PM 06/01/2022    1:04 PM  CBC  WBC 4.0 - 10.5 K/uL 6.1  5.8  6.6   Hemoglobin 13.0 - 17.0 g/dL 19.1  47.8  29.5   Hematocrit 39.0 - 52.0 % 44.5  40.3  43.1   Platelets 150 - 400  K/uL 173  170  156       Latest Ref Rng & Units 01/30/2023    1:21 PM 01/02/2023   10:07 AM 12/05/2022    1:00 PM  CMP  Glucose 70 - 99 mg/dL 67  84  962   BUN 8 - 23 mg/dL 20  37  24   Creatinine 0.61 - 1.24 mg/dL 9.52  8.41  3.24   Sodium 135 - 145 mmol/L 138  136  138   Potassium 3.5 - 5.1 mmol/L 3.8  4.1  3.5   Chloride 98 - 111 mmol/L 102  103  103   CO2 22 - 32 mmol/L 25  21  24    Calcium 8.9 - 10.3 mg/dL 9.4  9.4  8.4   Total Protein 6.5 - 8.1 g/dL 7.4  7.4  7.0   Total Bilirubin 0.0 - 1.2 mg/dL 0.3  0.4  0.8   Alkaline Phos 38 - 126 U/L 47  39  43   AST 15 - 41 U/L 15  14  14    ALT 0 - 44 U/L 8  8  9       No results found for: "CEA1", "CEA" / No results found for: "CEA1", "CEA" Lab Results  Component Value Date   PSA1 <0.1 09/02/2021   No results found for: "MWN027" No  results found for: "CAN125"  No results found for: "TOTALPROTELP", "ALBUMINELP", "A1GS", "A2GS", "BETS", "BETA2SER", "GAMS", "MSPIKE", "SPEI" No results found for: "TIBC", "FERRITIN", "IRONPCTSAT" No results found for: "LDH"   STUDIES:   No results found.

## 2023-01-30 NOTE — Progress Notes (Signed)
 Patient is taking Xtandi as prescribed.  He has not missed any doses and reports no side effects at this time.

## 2023-01-30 NOTE — Patient Instructions (Signed)
[facility] [place] - A DEPT OF [facility]  Discharge Instructions: Thank you for choosing [facility] to provide your oncology and hematology care.  If you have a lab appointment with the Cancer Center - please note that after April 8th, 2024, all labs will be drawn in the cancer center.  You do not have to check in or register with the main entrance as you have in the past but will complete your check-in in the cancer center.  Wear comfortable clothing and clothing appropriate for easy access to any Portacath or PICC line.   We strive to give you quality time with your provider. You may need to reschedule your appointment if you arrive late (15 or more minutes).  Arriving late affects you and other patients whose appointments are after yours.  Also, if you miss three or more appointments without notifying the office, you may be dismissed from the clinic at the provider's discretion.      For prescription refill requests, have your pharmacy contact our office and allow 72 hours for refills to be completed.    Today you received the following Eligard and Xgeva, return as scheduled.   To help prevent nausea and vomiting after your treatment, we encourage you to take your nausea medication as directed.  BELOW ARE SYMPTOMS THAT SHOULD BE REPORTED IMMEDIATELY: *FEVER GREATER THAN 100.4 F (38 C) OR HIGHER *CHILLS OR SWEATING *NAUSEA AND VOMITING THAT IS NOT CONTROLLED WITH YOUR NAUSEA MEDICATION *UNUSUAL SHORTNESS OF BREATH *UNUSUAL BRUISING OR BLEEDING *URINARY PROBLEMS (pain or burning when urinating, or frequent urination) *BOWEL PROBLEMS (unusual diarrhea, constipation, pain near the anus) TENDERNESS IN MOUTH AND THROAT WITH OR WITHOUT PRESENCE OF ULCERS (sore throat, sores in mouth, or a toothache) UNUSUAL RASH, SWELLING OR PAIN  UNUSUAL VAGINAL DISCHARGE OR ITCHING   Items with * indicate a potential emergency and should be followed up as soon as  possible or go to the Emergency Department if any problems should occur.  Please show the CHEMOTHERAPY ALERT CARD or IMMUNOTHERAPY ALERT CARD at check-in to the Emergency Department and triage nurse.  Should you have questions after your visit or need to cancel or reschedule your appointment, please contact [facility] [place] - A DEPT OF [facility] [phone]  and follow the prompts.  Office hours are 8:00 a.m. to 4:30 p.m. Monday - Friday. Please note that voicemails left after 4:00 p.m. may not be returned until the following business day.  We are closed weekends and major holidays. You have access to a nurse at all times for urgent questions. Please call the main number to the clinic [phone] and follow the prompts.  For any non-urgent questions, you may also contact your provider using MyChart. We now offer e-Visits for anyone 51 and older to request care online for non-urgent symptoms. For details visit [email or web address].   Also download the MyChart app! Go to the app store, search "MyChart", open the app, select [facility], and log in with your MyChart username and password.

## 2023-02-09 ENCOUNTER — Other Ambulatory Visit: Payer: Self-pay

## 2023-02-09 NOTE — Progress Notes (Signed)
Specialty Pharmacy Refill Coordination Note  Kyle George is a 73 y.o. male contacted today regarding refills of specialty medication(s) Enzalutamide [name])   Patient requested Delivery   Delivery date: 02/16/23   Verified address: [address]   [facility] [address]   Medication will be filled on 02/15/23.

## 2023-02-15 ENCOUNTER — Other Ambulatory Visit: Payer: Self-pay

## 2023-02-27 ENCOUNTER — Inpatient Hospital Stay: Payer: Medicare PPO

## 2023-02-27 ENCOUNTER — Inpatient Hospital Stay: Payer: Medicare PPO | Attending: Nurse Practitioner

## 2023-02-27 VITALS — BP 144/82 | HR 67 | Temp 96.7°F | Resp 18

## 2023-02-27 DIAGNOSIS — C61 Malignant neoplasm of prostate: Secondary | ICD-10-CM

## 2023-02-27 DIAGNOSIS — C775 Secondary and unspecified malignant neoplasm of intrapelvic lymph nodes: Secondary | ICD-10-CM | POA: Diagnosis not present

## 2023-02-27 DIAGNOSIS — C7951 Secondary malignant neoplasm of bone: Secondary | ICD-10-CM | POA: Insufficient documentation

## 2023-02-27 LAB — COMPREHENSIVE METABOLIC PANEL
ALT: 8 U/L (ref 0–44)
AST: 14 U/L — ABNORMAL LOW (ref 15–41)
Albumin: 3.4 g/dL — ABNORMAL LOW (ref 3.5–5.0)
Alkaline Phosphatase: 42 U/L (ref 38–126)
Anion gap: 11 (ref 5–15)
BUN: 22 mg/dL (ref 8–23)
CO2: 24 mmol/L (ref 22–32)
Calcium: 9.3 mg/dL (ref 8.9–10.3)
Chloride: 103 mmol/L (ref 98–111)
Creatinine, Ser: 0.88 mg/dL (ref 0.61–1.24)
GFR, Estimated: >60 mL/min (ref >60)
Glucose, Bld: 83 mg/dL (ref 70–99)
Potassium: 3.9 mmol/L (ref 3.5–5.1)
Sodium: 138 mmol/L (ref 135–145)
Total Bilirubin: 0.5 mg/dL (ref 0.0–1.2)
Total Protein: 7.4 g/dL (ref 6.5–8.1)

## 2023-02-27 MED ORDER — DENOSUMAB 120 MG/1.7ML ~~LOC~~ SOLN
120.0000 mg | Freq: Once | SUBCUTANEOUS | Status: AC
  Administered 2023-02-27: 120 mg via SUBCUTANEOUS
  Filled 2023-02-27: qty 1.7

## 2023-02-27 NOTE — Patient Instructions (Signed)
 Denosumab Injection (Oncology) What is this medication? DENOSUMAB (den oh SUE mab) prevents weakened bones caused by cancer. It may also be used to treat noncancerous bone tumors that cannot be removed by surgery. It can also be used to treat high calcium levels in the blood caused by cancer. It works by blocking a protein that causes bones to break down quickly. This slows down the release of calcium from bones, which lowers calcium levels in your blood. It also makes your bones stronger and less likely to break (fracture). This medicine may be used for other purposes; ask your health care provider or pharmacist if you have questions. COMMON BRAND NAME(S): XGEVA What should I tell my care team before I take this medication? They need to know if you have any of these conditions: Dental disease Having surgery or tooth extraction Infection Kidney disease Low levels of calcium or vitamin D in the blood Malnutrition On hemodialysis Skin conditions or sensitivity Thyroid or parathyroid disease An unusual reaction to denosumab, other medications, foods, dyes, or preservatives Pregnant or trying to get pregnant Breast-feeding How should I use this medication? This medication is for injection under the skin. It is given by your care team in a hospital or clinic setting. A special MedGuide will be given to you before each treatment. Be sure to read this information carefully each time. Talk to your care team about the use of this medication in children. While it may be prescribed for children as young as 13 years for selected conditions, precautions do apply. Overdosage: If you think you have taken too much of this medicine contact a poison control center or emergency room at once. NOTE: This medicine is only for you. Do not share this medicine with others. What if I miss a dose? Keep appointments for follow-up doses. It is important not to miss your dose. Call your care team if you are unable to  keep an appointment. What may interact with this medication? Do not take this medication with any of the following: Other medications containing denosumab This medication may also interact with the following: Medications that lower your chance of fighting infection Steroid medications, such as prednisone or cortisone This list may not describe all possible interactions. Give your health care provider a list of all the medicines, herbs, non-prescription drugs, or dietary supplements you use. Also tell them if you smoke, drink alcohol, or use illegal drugs. Some items may interact with your medicine. What should I watch for while using this medication? Your condition will be monitored carefully while you are receiving this medication. You may need blood work while taking this medication. This medication may increase your risk of getting an infection. Call your care team for advice if you get a fever, chills, sore throat, or other symptoms of a cold or flu. Do not treat yourself. Try to avoid being around people who are sick. You should make sure you get enough calcium and vitamin D while you are taking this medication, unless your care team tells you not to. Discuss the foods you eat and the vitamins you take with your care team. Some people who take this medication have severe bone, joint, or muscle pain. This medication may also increase your risk for jaw problems or a broken thigh bone. Tell your care team right away if you have severe pain in your jaw, bones, joints, or muscles. Tell your care team if you have any pain that does not go away or that gets worse. Talk  to your care team if you may be pregnant. Serious birth defects can occur if you take this medication during pregnancy and for 5 months after the last dose. You will need a negative pregnancy test before starting this medication. Contraception is recommended while taking this medication and for 5 months after the last dose. Your care team  can help you find the option that works for you. What side effects may I notice from receiving this medication? Side effects that you should report to your care team as soon as possible: Allergic reactions--skin rash, itching, hives, swelling of the face, lips, tongue, or throat Bone, joint, or muscle pain Low calcium level--muscle pain or cramps, confusion, tingling, or numbness in the hands or feet Osteonecrosis of the jaw--pain, swelling, or redness in the mouth, numbness of the jaw, poor healing after dental work, unusual discharge from the mouth, visible bones in the mouth Side effects that usually do not require medical attention (report to your care team if they continue or are bothersome): Cough Diarrhea Fatigue Headache Nausea This list may not describe all possible side effects. Call your doctor for medical advice about side effects. You may report side effects to FDA at 1-800-FDA-1088. Where should I keep my medication? This medication is given in a hospital or clinic. It will not be stored at home. NOTE: This sheet is a summary. It may not cover all possible information. If you have questions about this medicine, talk to your doctor, pharmacist, or health care provider.  2024 Elsevier/Gold Standard (2021-05-18 00:00:00)

## 2023-02-27 NOTE — Progress Notes (Signed)
 Patient taking calcium and vit D as directed. Labs reviewed.Patient tolerated Xgeva  injection with no complaints voiced.  Site clean and dry with no bruising or swelling noted at site.  See MAR for details.  Band aid applied.  Patient stable during and after injection.  Vss with discharge and left in satisfactory condition with no s/s of distress noted. All follow ups as noted.  Mataio Mele [employer]

## 2023-03-14 ENCOUNTER — Other Ambulatory Visit: Payer: Self-pay

## 2023-03-16 ENCOUNTER — Other Ambulatory Visit: Payer: Self-pay

## 2023-03-19 ENCOUNTER — Other Ambulatory Visit: Payer: Self-pay

## 2023-03-19 NOTE — Progress Notes (Signed)
 Specialty Pharmacy Refill Coordination Note  Kyle George is a 73 y.o. male contacted today regarding refills of specialty medication(s) Enzalutamide [name])   Patient requested Delivery   Delivery date: 03/21/23   Verified address: [address]   [facility] [address]   Medication will be filled on 03/20/23.

## 2023-03-27 ENCOUNTER — Inpatient Hospital Stay: Payer: Medicare PPO | Attending: Nurse Practitioner

## 2023-03-27 ENCOUNTER — Inpatient Hospital Stay: Payer: Medicare PPO

## 2023-03-27 VITALS — BP 135/74 | HR 60 | Temp 97.6°F | Resp 16

## 2023-03-27 DIAGNOSIS — C61 Malignant neoplasm of prostate: Secondary | ICD-10-CM

## 2023-03-27 DIAGNOSIS — C7951 Secondary malignant neoplasm of bone: Secondary | ICD-10-CM | POA: Diagnosis not present

## 2023-03-27 DIAGNOSIS — C779 Secondary and unspecified malignant neoplasm of lymph node, unspecified: Secondary | ICD-10-CM | POA: Diagnosis not present

## 2023-03-27 LAB — COMPREHENSIVE METABOLIC PANEL
ALT: 8 U/L (ref 0–44)
AST: 15 U/L (ref 15–41)
Albumin: 3.5 g/dL (ref 3.5–5.0)
Alkaline Phosphatase: 40 U/L (ref 38–126)
Anion gap: 12 (ref 5–15)
BUN: 22 mg/dL (ref 8–23)
CO2: 25 mmol/L (ref 22–32)
Calcium: 8.9 mg/dL (ref 8.9–10.3)
Chloride: 101 mmol/L (ref 98–111)
Creatinine, Ser: 1.02 mg/dL (ref 0.61–1.24)
GFR, Estimated: >60 mL/min (ref >60)
Glucose, Bld: 68 mg/dL — ABNORMAL LOW (ref 70–99)
Potassium: 3.6 mmol/L (ref 3.5–5.1)
Sodium: 138 mmol/L (ref 135–145)
Total Bilirubin: 0.5 mg/dL (ref 0.0–1.2)
Total Protein: 7.6 g/dL (ref 6.5–8.1)

## 2023-03-27 MED ORDER — DENOSUMAB 120 MG/1.7ML ~~LOC~~ SOLN
120.0000 mg | Freq: Once | SUBCUTANEOUS | Status: AC
  Administered 2023-03-27: 120 mg via SUBCUTANEOUS
  Filled 2023-03-27: qty 1.7

## 2023-03-27 NOTE — Progress Notes (Signed)
 Patient presents for Xgeva injection. Calcium 8.9. Creatinine 1.02.  Patient taking Calcium Carbonate - Vitamin D and Cholecalciferol 100 units tablet. Patient denies any jaw or mouth pain . Patient denies any major upcoming dental work.   Kyle George presents today for injection per the provider's orders.  Xgeva administration without incident; injection site WNL; see MAR for injection details.  Patient tolerated procedure well and without incident.  No questions or complaints noted at this time. \ Discharged from clinic ambulatory in stable condition. Alert and oriented x 3. F/U with [facility] as scheduled.

## 2023-03-27 NOTE — Patient Instructions (Signed)
 [facility] [place] - A DEPT OF [facility]  Discharge Instructions: Thank you for choosing [facility] to provide your oncology and hematology care.  If you have a lab appointment with the Cancer Center - please note that after April 8th, 2024, all labs will be drawn in the cancer center.  You do not have to check in or register with the main entrance as you have in the past but will complete your check-in in the cancer center.  Wear comfortable clothing and clothing appropriate for easy access to any Portacath or PICC line.   We strive to give you quality time with your provider. You may need to reschedule your appointment if you arrive late (15 or more minutes).  Arriving late affects you and other patients whose appointments are after yours.  Also, if you miss three or more appointments without notifying the office, you may be dismissed from the clinic at the provider's discretion.      For prescription refill requests, have your pharmacy contact our office and allow 72 hours for refills to be completed.    Today you received the following chemotherapy and/or immunotherapy agents Xgeva injection. Denosumab Injection (Oncology) What is this medication? DENOSUMAB (den oh SUE mab) prevents weakened bones caused by cancer. It may also be used to treat noncancerous bone tumors that cannot be removed by surgery. It can also be used to treat high calcium levels in the blood caused by cancer. It works by blocking a protein that causes bones to break down quickly. This slows down the release of calcium from bones, which lowers calcium levels in your blood. It also makes your bones stronger and less likely to break (fracture). This medicine may be used for other purposes; ask your health care provider or pharmacist if you have questions. COMMON BRAND NAME(S): XGEVA What should I tell my care team before I take this medication? They need to know if you have any of  these conditions: Dental disease Having surgery or tooth extraction Infection Kidney disease Low levels of calcium or vitamin D in the blood Malnutrition On hemodialysis Skin conditions or sensitivity Thyroid or parathyroid disease An unusual reaction to denosumab, other medications, foods, dyes, or preservatives Pregnant or trying to get pregnant Breast-feeding How should I use this medication? This medication is for injection under the skin. It is given by your care team in a hospital or clinic setting. A special MedGuide will be given to you before each treatment. Be sure to read this information carefully each time. Talk to your care team about the use of this medication in children. While it may be prescribed for children as young as 13 years for selected conditions, precautions do apply. Overdosage: If you think you have taken too much of this medicine contact a poison control center or emergency room at once. NOTE: This medicine is only for you. Do not share this medicine with others. What if I miss a dose? Keep appointments for follow-up doses. It is important not to miss your dose. Call your care team if you are unable to keep an appointment. What may interact with this medication? Do not take this medication with any of the following: Other medications containing denosumab This medication may also interact with the following: Medications that lower your chance of fighting infection Steroid medications, such as prednisone or cortisone This list may not describe all possible interactions. Give your health care provider a list of all the  medicines, herbs, non-prescription drugs, or dietary supplements you use. Also tell them if you smoke, drink alcohol, or use illegal drugs. Some items may interact with your medicine. What should I watch for while using this medication? Your condition will be monitored carefully while you are receiving this medication. You may need blood work  while taking this medication. This medication may increase your risk of getting an infection. Call your care team for advice if you get a fever, chills, sore throat, or other symptoms of a cold or flu. Do not treat yourself. Try to avoid being around people who are sick. You should make sure you get enough calcium and vitamin D while you are taking this medication, unless your care team tells you not to. Discuss the foods you eat and the vitamins you take with your care team. Some people who take this medication have severe bone, joint, or muscle pain. This medication may also increase your risk for jaw problems or a broken thigh bone. Tell your care team right away if you have severe pain in your jaw, bones, joints, or muscles. Tell your care team if you have any pain that does not go away or that gets worse. Talk to your care team if you may be pregnant. Serious birth defects can occur if you take this medication during pregnancy and for 5 months after the last dose. You will need a negative pregnancy test before starting this medication. Contraception is recommended while taking this medication and for 5 months after the last dose. Your care team can help you find the option that works for you. What side effects may I notice from receiving this medication? Side effects that you should report to your care team as soon as possible: Allergic reactions--skin rash, itching, hives, swelling of the face, lips, tongue, or throat Bone, joint, or muscle pain Low calcium level--muscle pain or cramps, confusion, tingling, or numbness in the hands or feet Osteonecrosis of the jaw--pain, swelling, or redness in the mouth, numbness of the jaw, poor healing after dental work, unusual discharge from the mouth, visible bones in the mouth Side effects that usually do not require medical attention (report to your care team if they continue or are bothersome): Cough Diarrhea Fatigue Headache Nausea This list may not  describe all possible side effects. Call your doctor for medical advice about side effects. You may report side effects to FDA at 1-800-FDA-1088. Where should I keep my medication? This medication is given in a hospital or clinic. It will not be stored at home. NOTE: This sheet is a summary. It may not cover all possible information. If you have questions about this medicine, talk to your doctor, pharmacist, or health care provider.  2024 Elsevier/Gold Standard (2021-05-18 00:00:00)      To help prevent nausea and vomiting after your treatment, we encourage you to take your nausea medication as directed.  BELOW ARE SYMPTOMS THAT SHOULD BE REPORTED IMMEDIATELY: *FEVER GREATER THAN 100.4 F (38 C) OR HIGHER *CHILLS OR SWEATING *NAUSEA AND VOMITING THAT IS NOT CONTROLLED WITH YOUR NAUSEA MEDICATION *UNUSUAL SHORTNESS OF BREATH *UNUSUAL BRUISING OR BLEEDING *URINARY PROBLEMS (pain or burning when urinating, or frequent urination) *BOWEL PROBLEMS (unusual diarrhea, constipation, pain near the anus) TENDERNESS IN MOUTH AND THROAT WITH OR WITHOUT PRESENCE OF ULCERS (sore throat, sores in mouth, or a toothache) UNUSUAL RASH, SWELLING OR PAIN  UNUSUAL VAGINAL DISCHARGE OR ITCHING   Items with * indicate a potential emergency and should be followed up as  soon as possible or go to the Emergency Department if any problems should occur.  Please show the CHEMOTHERAPY ALERT CARD or IMMUNOTHERAPY ALERT CARD at check-in to the Emergency Department and triage nurse.  Should you have questions after your visit or need to cancel or reschedule your appointment, please contact [facility] [place] - A DEPT OF [facility] [phone]  and follow the prompts.  Office hours are 8:00 a.m. to 4:30 p.m. Monday - Friday. Please note that voicemails left after 4:00 p.m. may not be returned until the following business day.  We are closed weekends and major holidays. You have access to a nurse at  all times for urgent questions. Please call the main number to the clinic [phone] and follow the prompts.  For any non-urgent questions, you may also contact your provider using MyChart. We now offer e-Visits for anyone 96 and older to request care online for non-urgent symptoms. For details visit [email or web address].   Also download the MyChart app! Go to the app store, search "MyChart", open the app, select [facility], and log in with your MyChart username and password.

## 2023-04-05 ENCOUNTER — Ambulatory Visit (INDEPENDENT_AMBULATORY_CARE_PROVIDER_SITE_OTHER): Payer: Medicare PPO | Admitting: Family Medicine

## 2023-04-05 ENCOUNTER — Encounter: Payer: Self-pay | Admitting: Family Medicine

## 2023-04-05 VITALS — BP 122/82 | HR 55 | Temp 97.5°F | Ht 67.2 in | Wt 180.0 lb

## 2023-04-05 DIAGNOSIS — Z794 Long term (current) use of insulin: Secondary | ICD-10-CM | POA: Diagnosis not present

## 2023-04-05 DIAGNOSIS — I700 Atherosclerosis of aorta: Secondary | ICD-10-CM

## 2023-04-05 DIAGNOSIS — E11628 Type 2 diabetes mellitus with other skin complications: Secondary | ICD-10-CM | POA: Diagnosis not present

## 2023-04-05 DIAGNOSIS — Z0000 Encounter for general adult medical examination without abnormal findings: Secondary | ICD-10-CM

## 2023-04-05 DIAGNOSIS — C61 Malignant neoplasm of prostate: Secondary | ICD-10-CM

## 2023-04-05 DIAGNOSIS — I10 Essential (primary) hypertension: Secondary | ICD-10-CM

## 2023-04-05 DIAGNOSIS — I452 Bifascicular block: Secondary | ICD-10-CM | POA: Diagnosis not present

## 2023-04-05 LAB — POCT URINALYSIS DIPSTICK
Bilirubin, UA: NEGATIVE
Glucose, UA: NEGATIVE
Ketones, UA: NEGATIVE
Leukocytes, UA: NEGATIVE
Nitrite, UA: NEGATIVE
Protein, UA: POSITIVE — AB
Spec Grav, UA: 1.02 (ref 1.010–1.025)
Urobilinogen, UA: 0.2 U/dL
pH, UA: 5.5 (ref 5.0–8.0)

## 2023-04-05 NOTE — Progress Notes (Signed)
 I,Jameka J Llittleton, CMA,acting as a Neurosurgeon for [employer], NP.,have documented all relevant documentation on the behalf of Kyle Hose, NP,as directed by  Kyle Hose, NP while in the presence of Kyle Hose, NP.  Subjective:   Patient ID: Kyle George , male    DOB: 07/27/1950 , 73 y.o.   MRN: [id number]  Chief Complaint  Patient presents with   Annual Exam    HPI  Patient is a 73 year old male who presents today for annual physical examination. 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 He is on Xtandi 160 mg  every day ,and he is followed by oncology monthly Dr Ellin Saba.  He is independent of his ADLs and IADLs . He is currently an active smoker,has smoker for about years and not willing to quit. Patient doesn't have any questions or concerns at this time.      Past Medical History:  Diagnosis Date   Cancer of prostate (HCC)    Diabetes mellitus without complication (HCC)    Hyperlipidemia    Hypertension      Family History  Problem Relation Age of Onset   Hypertension Mother    Diabetes Mother    Cancer Father        He is unsure of what type of cancer but it was not prostate cancer   Cancer Paternal Uncle        Pt is unsure of what type of cancer   Colon cancer Neg Hx    Esophageal cancer Neg Hx    Stomach cancer Neg Hx    Rectal cancer Neg Hx      Current Outpatient Medications:    aspirin 81 MG tablet, Take 81 mg by mouth daily., Disp: , Rfl:    blood glucose meter kit and supplies KIT, Dispense based on patient and insurance preference. Use up to four times daily as directed., Disp: 1 each, Rfl: 0   Blood Pressure Monitoring (BLOOD PRESSURE CUFF) MISC, by Does not apply route. Check blood pressure at least 4 times per week 3 hours after taking medication, Disp: , Rfl:    Calcium Carbonate-Vitamin D (CALCIUM 600+D PO), Take 1 tablet by mouth daily. Taking calcium + D3, Disp: , Rfl:    carvedilol  (COREG) 6.25 MG tablet, TAKE 1 TABLET BY MOUTH TWICE A DAY WITH FOOD, Disp: 180 tablet, Rfl: 1   cholecalciferol (VITAMIN D) 1000 UNITS tablet, Take 5,000 Units by mouth daily., Disp: , Rfl:    enzalutamide (XTANDI) 40 MG tablet, TAKE 4 TABLETS (160 MG TOTAL) BY MOUTH DAILY., Disp: 120 tablet, Rfl: 2   glucose blood test strip, 1 each by Other route as needed for other. Use as instructed to check blood sugars, Disp: , Rfl:    Lancets (ONETOUCH DELICA PLUS LANCET33G) MISC, by Does not apply route. Use as directed to check blood  Sugars 2 times per day dx E11.65, Disp: , Rfl:    Multiple Vitamin (MULTIVITAMIN WITH MINERALS) TABS, Take 1 tablet by mouth daily., Disp: , Rfl:    NON FORMULARY, Take 1 tablet by mouth daily. Thyroid  action, Disp: , Rfl:    Olmesartan-amLODIPine-HCTZ 40-10-25 MG TABS, TAKE 1 TABLET BY MOUTH EVERY DAY, Disp: 90 tablet, Rfl: 1   Omega-3 Fatty Acids (FISH OIL PO), Take 1 tablet by mouth once., Disp: , Rfl:    pioglitazone-metformin (ACTOPLUS MET) 15-850 MG tablet, TAKE 1 TABLET BY MOUTH TWICE A DAY, Disp:  180 tablet, Rfl: 1   rosuvastatin (CRESTOR) 10 MG tablet, TAKE 1 TABLET BY MOUTH EVERY DAY, Disp: 90 tablet, Rfl: 1   Semaglutide,0.25 or 0.5MG /DOS, (OZEMPIC, 0.25 OR 0.5 MG/DOSE,) 2 MG/3ML SOPN, INJECT 0.25 MG WEEKLY FOR 4 WEEKS, THEN INCREASE TO 0.5 MG WEEKLY, Disp: 3 mL, Rfl: 2   spironolactone (ALDACTONE) 25 MG tablet, TAKE 1 TABLET (25 MG TOTAL) BY MOUTH DAILY., Disp: 90 tablet, Rfl: 1   No Known Allergies    Flowsheet Row Office Visit from 04/05/2023 in [facility]  PHQ-2 Total Score 0      Social History   Substance and Sexual Activity  Alcohol Use Yes   Comment: occassionally   Social History   Tobacco Use  Smoking Status Some Days   Current packs/day: 0.25   Average packs/day: 0.3 packs/day for 50.0 years (12.5 ttl pk-yrs)   Types: Cigarettes  Smokeless Tobacco Former   Types: Chew   Quit date: 1975  Tobacco  Comments   smoked about 1/2 PPD for about 10 years; now at 4-5 cigarettes per day   .   Review of Systems  Constitutional: Negative.   HENT: Negative.    Eyes: Negative.   Respiratory: Negative.    Cardiovascular: Negative.   Gastrointestinal: Negative.   Endocrine: Negative.   Genitourinary: Negative.   Musculoskeletal: Negative.   Skin: Negative.   Neurological: Negative.   Hematological: Negative.   Psychiatric/Behavioral: Negative.       Today's Vitals   04/05/23 1137  BP: 122/82  Pulse: (!) 55  Temp: (!) 97.5 F (36.4 C)  TempSrc: Oral  Weight: 180 lb (81.6 kg)  Height: 5' 7.2" (1.707 m)  PainSc: 0-No pain   Body mass index is 28.02 kg/m.  Wt Readings from Last 3 Encounters:  04/05/23 180 lb (81.6 kg)  01/30/23 176 lb 9.4 oz (80.1 kg)  10/10/22 182 lb 6.4 oz (82.7 kg)    Objective:  Physical Exam HENT:     Head: Normocephalic.  Cardiovascular:     Rate and Rhythm: Bradycardia present.  Pulmonary:     Effort: Pulmonary effort is normal.     Breath sounds: Normal breath sounds.  Abdominal:     General: Abdomen is flat.  Musculoskeletal:        General: Normal range of motion.  Skin:    General: Skin is warm and dry.  Neurological:     General: No focal deficit present.     Mental Status: He is alert and oriented to person, place, and time.  Psychiatric:        Mood and Affect: Mood normal.        Behavior: Behavior normal.         Assessment And Plan:    Encounter for general adult medical examination w/o abnormal findings  Essential hypertension -     EKG 12-Lead -     Microalbumin / creatinine urine ratio -     POCT urinalysis dipstick -     CBC -     CMP14+EGFR  Type 2 diabetes mellitus with other skin complication, with long-term current use of insulin (HCC) Assessment & Plan: Continue Ozempic injection Q weekly  Orders: -     Hemoglobin A1c  Aortic atherosclerosis (HCC) Assessment & Plan: Continue Crestor 10 mg every  day   Orders: -     Lipid panel  Prostate cancer (HCC) Assessment & Plan: On XTANDI 160 mg daily   RBBB (right bundle  branch block with left anterior fascicular block) Assessment & Plan: EKG 04/05/2023; no changes from 04/03/2022      Return in 16 weeks (on 07/26/2023) for 1 year physical, diabetes. Patient was given opportunity to ask questions. Patient verbalized understanding of the plan and was able to repeat key elements of the plan. All questions were answered to their satisfaction.   I, Kyle Hose, NP, have reviewed all documentation for this visit. The documentation on 04/09/2023 for the exam, diagnosis, procedures, and orders are all accurate and complete.

## 2023-04-05 NOTE — Patient Instructions (Signed)
 Health Maintenance, Male  Adopting a healthy lifestyle and getting preventive care are important in promoting health and wellness. Ask your health care provider about:  The right schedule for you to have regular tests and exams.  Things you can do on your own to prevent diseases and keep yourself healthy.  What should I know about diet, weight, and exercise?  Eat a healthy diet    Eat a diet that includes plenty of vegetables, fruits, low-fat dairy products, and lean protein.  Do not eat a lot of foods that are high in solid fats, added sugars, or sodium.  Maintain a healthy weight  Body mass index (BMI) is a measurement that can be used to identify possible weight problems. It estimates body fat based on height and weight. Your health care provider can help determine your BMI and help you achieve or maintain a healthy weight.  Get regular exercise  Get regular exercise. This is one of the most important things you can do for your health. Most adults should:  Exercise for at least 150 minutes each week. The exercise should increase your heart rate and make you sweat (moderate-intensity exercise).  Do strengthening exercises at least twice a week. This is in addition to the moderate-intensity exercise.  Spend less time sitting. Even light physical activity can be beneficial.  Watch cholesterol and blood lipids  Have your blood tested for lipids and cholesterol at 74 years of age, then have this test every 5 years.  You may need to have your cholesterol levels checked more often if:  Your lipid or cholesterol levels are high.  You are older than 74 years of age.  You are at high risk for heart disease.  What should I know about cancer screening?  Many types of cancers can be detected early and may often be prevented. Depending on your health history and family history, you may need to have cancer screening at various ages. This may include screening for:  Colorectal cancer.  Prostate cancer.  Skin cancer.  Lung  cancer.  What should I know about heart disease, diabetes, and high blood pressure?  Blood pressure and heart disease  High blood pressure causes heart disease and increases the risk of stroke. This is more likely to develop in people who have high blood pressure readings or are overweight.  Talk with your health care provider about your target blood pressure readings.  Have your blood pressure checked:  Every 3-5 years if you are 74-95 years of age.  Every year if you are 85 years old or older.  If you are between the ages of 74 and 29 and are a current or former smoker, ask your health care provider if you should have a one-time screening for abdominal aortic aneurysm (AAA).  Diabetes  Have regular diabetes screenings. This checks your fasting blood sugar level. Have the screening done:  Once every three years after age 74 if you are at a normal weight and have a low risk for diabetes.  More often and at a younger age if you are overweight or have a high risk for diabetes.  What should I know about preventing infection?  Hepatitis B  If you have a higher risk for hepatitis B, you should be screened for this virus. Talk with your health care provider to find out if you are at risk for hepatitis B infection.  Hepatitis C  Blood testing is recommended for:  Everyone born from [birth year] through 1966.  Anyone  with known risk factors for hepatitis C.  Sexually transmitted infections (STIs)  You should be screened each year for STIs, including gonorrhea and chlamydia, if:  You are sexually active and are younger than 74 years of age.  You are older than 74 years of age and your health care provider tells you that you are at risk for this type of infection.  Your sexual activity has changed since you were last screened, and you are at increased risk for chlamydia or gonorrhea. Ask your health care provider if you are at risk.  Ask your health care provider about whether you are at high risk for HIV. Your health care provider  may recommend a prescription medicine to help prevent HIV infection. If you choose to take medicine to prevent HIV, you should first get tested for HIV. You should then be tested every 3 months for as long as you are taking the medicine.  Follow these instructions at home:  Alcohol use  Do not drink alcohol if your health care provider tells you not to drink.  If you drink alcohol:  Limit how much you have to 0-2 drinks a day.  Know how much alcohol is in your drink. In the [place] one drink equals one 12 oz bottle of beer (355 mL), one 5 oz glass of wine (148 mL), or one 1 oz glass of hard liquor (44 mL).  Lifestyle  Do not use any products that contain nicotine or tobacco. These products include cigarettes, chewing tobacco, and vaping devices, such as e-cigarettes. If you need help quitting, ask your health care provider.  Do not use street drugs.  Do not share needles.  Ask your health care provider for help if you need support or information about quitting drugs.  General instructions  Schedule regular health, dental, and eye exams.  Stay current with your vaccines.  Tell your health care provider if:  You often feel depressed.  You have ever been abused or do not feel safe at home.  Summary  Adopting a healthy lifestyle and getting preventive care are important in promoting health and wellness.  Follow your health care provider's instructions about healthy diet, exercising, and getting tested or screened for diseases.  Follow your health care provider's instructions on monitoring your cholesterol and blood pressure.  This information is not intended to replace advice given to you by your health care provider. Make sure you discuss any questions you have with your health care provider.  Document Revised: 05/17/2020 Document Reviewed: 05/17/2020  Elsevier Patient Education  2024 [employer].

## 2023-04-06 LAB — LIPID PANEL
Chol/HDL Ratio: 1.8 ratio (ref 0.0–5.0)
Cholesterol, Total: 130 mg/dL (ref 100–199)
HDL: 74 mg/dL (ref >39)
LDL Chol Calc (NIH): 40 mg/dL (ref 0–99)
Triglycerides: 80 mg/dL (ref 0–149)
VLDL Cholesterol Cal: 16 mg/dL (ref 5–40)

## 2023-04-06 LAB — CBC
Hematocrit: 41.9 % (ref 37.5–51.0)
Hemoglobin: 14.3 g/dL (ref 13.0–17.7)
MCH: 34.5 pg — ABNORMAL HIGH (ref 26.6–33.0)
MCHC: 34.1 g/dL (ref 31.5–35.7)
MCV: 101 fL — ABNORMAL HIGH (ref 79–97)
RBC: 4.15 x10E6/uL (ref 4.14–5.80)
RDW: 13.9 % (ref 11.6–15.4)
WBC: 7.4 10*3/uL (ref 3.4–10.8)

## 2023-04-06 LAB — CMP14+EGFR
ALT: 6 IU/L (ref 0–44)
AST: 12 IU/L (ref 0–40)
Albumin: 3.9 g/dL (ref 3.8–4.8)
Alkaline Phosphatase: 56 IU/L (ref 44–121)
BUN/Creatinine Ratio: 15 (ref 10–24)
BUN: 14 mg/dL (ref 8–27)
Bilirubin Total: 0.3 mg/dL (ref 0.0–1.2)
CO2: 22 mmol/L (ref 20–29)
Calcium: 9 mg/dL (ref 8.6–10.2)
Chloride: 104 mmol/L (ref 96–106)
Creatinine, Ser: 0.91 mg/dL (ref 0.76–1.27)
Globulin, Total: 2.9 g/dL (ref 1.5–4.5)
Glucose: 54 mg/dL — ABNORMAL LOW (ref 70–99)
Potassium: 4.3 mmol/L (ref 3.5–5.2)
Sodium: 144 mmol/L (ref 134–144)
Total Protein: 6.8 g/dL (ref 6.0–8.5)
eGFR: 88 mL/min/{1.73_m2} (ref >59)

## 2023-04-06 LAB — MICROALBUMIN / CREATININE URINE RATIO
Creatinine, Urine: 70.8 mg/dL
Microalb/Creat Ratio: 191 mg/g{creat} — ABNORMAL HIGH (ref 0–29)
Microalbumin, Urine: 134.9 ug/mL

## 2023-04-06 LAB — HEMOGLOBIN A1C
Est. average glucose Bld gHb Est-mCnc: 111 mg/dL
Hgb A1c MFr Bld: 5.5 % (ref 4.8–5.6)

## 2023-04-09 DIAGNOSIS — Z0000 Encounter for general adult medical examination without abnormal findings: Secondary | ICD-10-CM | POA: Insufficient documentation

## 2023-04-09 DIAGNOSIS — I452 Bifascicular block: Secondary | ICD-10-CM | POA: Insufficient documentation

## 2023-04-09 NOTE — Progress Notes (Signed)
 Cholesterol is 130 which is normal, A1c 5.5 which is also  normal. FBS 54 which is low .... Do you know the signs of low blood sugar? Headaches, hunger,dizziness, shakiness etc so you can recognize it if you where to experience it. Please always have a snack handy. Let me know if you have a questions  Thank you

## 2023-04-09 NOTE — Assessment & Plan Note (Signed)
 EKG 04/05/2023; no changes from 04/03/2022

## 2023-04-09 NOTE — Assessment & Plan Note (Addendum)
 Continue Crestor 10 mg every day

## 2023-04-09 NOTE — Assessment & Plan Note (Signed)
 Continue Ozempic injection Q weekly

## 2023-04-09 NOTE — Assessment & Plan Note (Signed)
 On XTANDI 160 mg daily

## 2023-04-12 ENCOUNTER — Other Ambulatory Visit: Payer: Self-pay | Admitting: Hematology

## 2023-04-12 ENCOUNTER — Other Ambulatory Visit: Payer: Self-pay | Admitting: Pharmacy Technician

## 2023-04-12 ENCOUNTER — Other Ambulatory Visit: Payer: Self-pay

## 2023-04-12 ENCOUNTER — Other Ambulatory Visit (HOSPITAL_COMMUNITY): Payer: Self-pay

## 2023-04-12 DIAGNOSIS — C61 Malignant neoplasm of prostate: Secondary | ICD-10-CM

## 2023-04-12 MED ORDER — ENZALUTAMIDE 40 MG PO TABS
ORAL_TABLET | ORAL | 2 refills | Status: DC
  Filled 2023-04-12: qty 120, 30d supply, fill #0
  Filled 2023-05-08: qty 120, 30d supply, fill #1
  Filled 2023-06-19: qty 120, 30d supply, fill #2

## 2023-04-12 NOTE — Progress Notes (Addendum)
 Specialty Pharmacy Refill Coordination Note  Kyle George is a 73 y.o. male contacted today regarding refills of specialty medication(s) Enzalutamide [name])   Patient requested Delivery   Delivery date: 04/18/23   Verified address: [address] [place]   Medication will be filled on 04/17/23.   RR sent to MD

## 2023-04-18 ENCOUNTER — Other Ambulatory Visit: Payer: Self-pay | Admitting: Nurse Practitioner

## 2023-04-19 ENCOUNTER — Other Ambulatory Visit: Payer: Self-pay

## 2023-04-19 DIAGNOSIS — E119 Type 2 diabetes mellitus without complications: Secondary | ICD-10-CM

## 2023-04-19 MED ORDER — PIOGLITAZONE HCL-METFORMIN HCL 15-850 MG PO TABS: 1.0000 | ORAL_TABLET | Freq: Two times a day (BID) | ORAL | 1 refills | Status: DC

## 2023-04-24 ENCOUNTER — Inpatient Hospital Stay: Payer: Medicare PPO

## 2023-04-24 ENCOUNTER — Inpatient Hospital Stay: Payer: Medicare PPO | Attending: Nurse Practitioner

## 2023-04-24 VITALS — BP 149/87 | HR 58 | Temp 97.5°F | Resp 18 | Wt 176.4 lb

## 2023-04-24 DIAGNOSIS — C61 Malignant neoplasm of prostate: Secondary | ICD-10-CM | POA: Diagnosis not present

## 2023-04-24 DIAGNOSIS — C7951 Secondary malignant neoplasm of bone: Secondary | ICD-10-CM | POA: Diagnosis not present

## 2023-04-24 DIAGNOSIS — Z79899 Other long term (current) drug therapy: Secondary | ICD-10-CM | POA: Insufficient documentation

## 2023-04-24 LAB — COMPREHENSIVE METABOLIC PANEL WITH GFR
ALT: 10 U/L (ref 0–44)
AST: 15 U/L (ref 15–41)
Albumin: 3.4 g/dL — ABNORMAL LOW (ref 3.5–5.0)
Alkaline Phosphatase: 41 U/L (ref 38–126)
Anion gap: 9 (ref 5–15)
BUN: 19 mg/dL (ref 8–23)
CO2: 26 mmol/L (ref 22–32)
Calcium: 8.9 mg/dL (ref 8.9–10.3)
Chloride: 105 mmol/L (ref 98–111)
Creatinine, Ser: 1.02 mg/dL (ref 0.61–1.24)
GFR, Estimated: >60 mL/min (ref >60)
Glucose, Bld: 93 mg/dL (ref 70–99)
Potassium: 3.6 mmol/L (ref 3.5–5.1)
Sodium: 140 mmol/L (ref 135–145)
Total Bilirubin: 0.6 mg/dL (ref 0.0–1.2)
Total Protein: 7.2 g/dL (ref 6.5–8.1)

## 2023-04-24 MED ORDER — DENOSUMAB 120 MG/1.7ML ~~LOC~~ SOLN
120.0000 mg | Freq: Once | SUBCUTANEOUS | Status: AC
  Administered 2023-04-24: 120 mg via SUBCUTANEOUS
  Filled 2023-04-24: qty 1.7

## 2023-04-24 NOTE — Progress Notes (Signed)
 Patient tolerated injection with no complaints voiced.  Site clean and dry with no bruising or swelling noted at site.  See MAR for details.  Band aid applied.  Patient stable during and after injection.  Vss with discharge and left in satisfactory condition with no s/s of distress noted.

## 2023-04-24 NOTE — Patient Instructions (Signed)

## 2023-05-08 ENCOUNTER — Other Ambulatory Visit: Payer: Self-pay

## 2023-05-08 NOTE — Progress Notes (Signed)
 Specialty Pharmacy Refill Coordination Note  Kyle George is a 73 y.o. male contacted today regarding refills of specialty medication(s) Enzalutamide  (XTANDI )   Patient requested Delivery   Delivery date: 05/15/23   Verified address: [address] [place]   Medication will be filled on 05/14/23.  Patient aware UPS may arrive 5/6 or 5/7.

## 2023-05-08 NOTE — Progress Notes (Signed)
 Specialty Pharmacy Ongoing Clinical Assessment Note  Kyle George is a 73 y.o. male who is being followed by the specialty pharmacy service for Rx[facility] Oncology   Patient's specialty medication(s) reviewed today: Enzalutamide  (XTANDI )   Missed doses in the last 4 weeks: 0   Patient/Caregiver did not have any additional questions or concerns.   Therapeutic benefit summary: Patient is achieving benefit   Adverse events/side effects summary: No adverse events/side effects   Patient's therapy is appropriate to: Continue    Goals Addressed             This Visit's Progress    Slow Disease Progression       Patient is on track. Patient will maintain adherence. PSA remains <0.01 as of 01/30/23 labs.         Follow up:  6 months  Kenadee Gates M Cathlin Buchan Specialty Pharmacist

## 2023-05-14 ENCOUNTER — Other Ambulatory Visit: Payer: Self-pay

## 2023-05-21 NOTE — Progress Notes (Incomplete)
 [facility] [address] [place] [address]    Clinic Day:  05/21/2023  Referring physician: Susanna Epley, [name]  Patient Care Team: Susanna Epley, [name] as PCP - General (General Practice) Paulett Boros, MD as Medical Oncologist (Medical Oncology)   ASSESSMENT & PLAN:   Assessment: 1.  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 - CT CAP (03/24/2020): Mildly improved retroperitoneal and left pelvic sidewall adenopathy but with new and substantially increased sclerosis metastatic lesions.  Upper normal partially calcified subcarinal lymph nodes. - Xtandi  160 mg daily started on 03/01/2020.   2.  Social/family history: - Lives at home with his wife.  Independent of ADLs and IADLs.  Does gardening.  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 - Current active smoker, 3 to 4 cigarettes/day.  Smoked 1 pack/day for more than 30 years. - Father and paternal uncle had cancers, type not known to the patient.    Plan: 1.  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 - He lost about 30 pounds since he was started on Ozempic .  He reports decreased appetite.  Otherwise no GI adverse effects noted. - Reviewed labs today: Normal LFTs and creatinine.  CBC was normal.  Last PSA was undetectable.  Will follow-up on PSA from today. - He may proceed with Eligard  30 mg today.  Continue enzalutamide  160 mg daily. - RTC 4 months with repeat PSA levels.   2.  Metastatic bone  disease: - Denosumab  started on 10/12/2022. - He does not report any adverse effects.  Calcium  today is 9.4 with almond 3.5. - Proceed with denosumab  today and monthly.  No orders of the defined types were placed in this encounter.     [name],acting as a Neurosurgeon for Paulett Boros, MD.,have documented all relevant documentation on the behalf of Paulett Boros, MD,as directed by  Paulett Boros, MD while in the presence of Paulett Boros, MD.  ***    Pillsbury R Teague   5/12/20254:45 PM  CHIEF COMPLAINT:   Diagnosis: metastatic prostate cancer   Cancer Staging  Secondary malignant neoplasm of bone [facility]) Staging form: Bone - Appendicular Skeleton, Trunk, Skull, and Facial Bones, AJCC 8th Edition - Clinical: Stage IVB (cTX, cNX, pM1b) - Signed by Renna Cary, MD on 04/29/2020    Prior Therapy: 1. ADT (Firmagon, Lupron ) 11/2011 - 12/2013 2. abiraterone  08/2016 - 01/2020  Current Therapy:  Xtandi ; ADT (Eligard )   HISTORY OF PRESENT ILLNESS:   Oncology History  Secondary malignant neoplasm of bone (HCC)  11/25/2019 Initial Diagnosis   Secondary malignant neoplasm of bone (HCC)   04/29/2020 Cancer Staging   Staging form: Bone - Appendicular Skeleton, Trunk, Skull, and Facial Bones, AJCC 8th Edition - Clinical: Stage IVB (cTX, cNX, pM1b) - Signed by Renna Cary, MD on 04/29/2020      INTERVAL HISTORY:   Kyle George is a 73 y.o. male presenting to clinic today for follow up of metastatic prostate cancer. He was last seen by me on 01/30/23.  Today, he  states that he is doing well overall. His appetite level is at ***%. His energy level is at ***%.   PAST MEDICAL HISTORY:   Past Medical History: Past Medical History:  Diagnosis Date   Cancer of prostate (HCC)    Diabetes mellitus without complication (HCC)    Hyperlipidemia    Hypertension     Surgical History: Past Surgical History:  Procedure Laterality Date   COLONOSCOPY      HEMORRHOID SURGERY      Social History: Social History   Socioeconomic History   Marital status: Married    Spouse name: Not on file   Number of children: Not on file   Years of education: Not on file   Highest education level: Not on file  Occupational History   Not on file  Tobacco Use   Smoking status: Some Days    Current packs/day: 0.25    Average packs/day: 0.3 packs/day for 50.0 years (12.5 ttl pk-yrs)    Types: Cigarettes   Smokeless tobacco: Former    Types: Chew    Quit date: 1975   Tobacco comments:    smoked about 1/2 PPD for about 10 years; now at 4-5 cigarettes per day   Vaping Use   Vaping status: Never Used  Substance and Sexual Activity   Alcohol use: Yes    Comment: occassionally   Drug use: Never   Sexual activity: Yes  Other Topics Concern   Not on file  Social History Narrative   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   Social Drivers of Corporate investment banker Strain: Low Risk  (08/23/2022)   Overall Financial Resource Strain (CARDIA)    Difficulty of Paying Living Expenses: Not hard at all  Food Insecurity: No Food Insecurity (08/23/2022)   Hunger Vital Sign    Worried About Running Out of Food in the Last Year: Never true    Ran Out of Food in the Last Year: Never true  Transportation Needs: No Transportation Needs (08/23/2022)   PRAPARE - Administrator, Civil Service (Medical): No    Lack of Transportation (Non-Medical): No  Physical Activity: Sufficiently Active (08/23/2022)   Exercise Vital Sign    Days of Exercise per Week: 6 days    Minutes of Exercise per Session: 90 min  Stress: No Stress Concern Present (08/23/2022)   [employer] of Occupational Health - Occupational Stress Questionnaire    Feeling of Stress : Not at all  Social Connections: Moderately Integrated (08/23/2022)   Social Connection and Isolation Panel [NHANES]    Frequency of Communication with Friends and Family: More than  three times a week    Frequency of Social Gatherings with Friends and Family: Once a week    Attends Religious Services: More than 4 times per year    Active Member of [employer] or Organizations: No    Attends Banker Meetings: Never    Marital Status: Married  Catering manager Violence: Not At Risk (08/23/2022)   Humiliation, Afraid, Rape, and Kick questionnaire    Fear of Current or Ex-Partner: No    Emotionally Abused: No    Physically Abused: No    Sexually Abused: No    Family History: Family History  Problem Relation Age of Onset   Hypertension Mother    Diabetes Mother    Cancer Father        He is unsure of what type of cancer but it was  not prostate cancer   Cancer Paternal Uncle        Pt is unsure of what type of cancer   Colon cancer Neg Hx    Esophageal cancer Neg Hx    Stomach cancer Neg Hx    Rectal cancer Neg Hx     Current Medications:  Current Outpatient Medications:    aspirin 81 MG tablet, Take 81 mg by mouth daily., Disp: , Rfl:    blood glucose meter kit and supplies KIT, Dispense based on patient and insurance preference. Use up to four times daily as directed., Disp: 1 each, Rfl: 0   Blood Pressure Monitoring (BLOOD PRESSURE CUFF) MISC, by Does not apply route. Check blood pressure at least 4 times per week 3 hours after taking medication, Disp: , Rfl:    Calcium  Carbonate-Vitamin D  (CALCIUM  600+D PO), Take 1 tablet by mouth daily. Taking calcium  + D3, Disp: , Rfl:    carvedilol  (COREG ) 6.25 MG tablet, TAKE 1 TABLET BY MOUTH TWICE A DAY WITH FOOD, Disp: 180 tablet, Rfl: 1   cholecalciferol (VITAMIN D ) 1000 UNITS tablet, Take 5,000 Units by mouth daily., Disp: , Rfl:    enzalutamide  (XTANDI ) 40 MG tablet, TAKE 4 TABLETS (160 MG TOTAL) BY MOUTH DAILY., Disp: 120 tablet, Rfl: 2   glucose blood test strip, 1 each by Other route as needed for other. Use as instructed to check blood sugars, Disp: , Rfl:    Lancets (ONETOUCH DELICA PLUS LANCET33G)  MISC, by Does not apply route. Use as directed to check blood  Sugars 2 times per day dx E11.65, Disp: , Rfl:    Multiple Vitamin (MULTIVITAMIN WITH MINERALS) TABS, Take 1 tablet by mouth daily., Disp: , Rfl:    NON FORMULARY, Take 1 tablet by mouth daily. Thyroid   action, Disp: , Rfl:    Olmesartan -amLODIPine -HCTZ 40-10-25 MG TABS, TAKE 1 TABLET BY MOUTH EVERY DAY, Disp: 90 tablet, Rfl: 1   Omega-3 Fatty Acids (FISH OIL PO), Take 1 tablet by mouth once., Disp: , Rfl:    pioglitazone -metformin  (ACTOPLUS MET ) 15-850 MG tablet, Take 1 tablet by mouth 2 (two) times daily., Disp: 180 tablet, Rfl: 1   rosuvastatin  (CRESTOR ) 10 MG tablet, TAKE 1 TABLET BY MOUTH EVERY DAY, Disp: 90 tablet, Rfl: 1   Semaglutide ,0.25 or 0.5MG /DOS, (OZEMPIC , 0.25 OR 0.5 MG/DOSE,) 2 MG/3ML SOPN, INJECT 0.25 MG WEEKLY FOR 4 WEEKS, THEN INCREASE TO 0.5 MG WEEKLY, Disp: 3 mL, Rfl: 2   spironolactone  (ALDACTONE ) 25 MG tablet, TAKE 1 TABLET (25 MG TOTAL) BY MOUTH DAILY., Disp: 90 tablet, Rfl: 1   Allergies: No Known Allergies  REVIEW OF SYSTEMS:   Review of Systems  Constitutional:  Negative for chills, fatigue and fever.  HENT:   Negative for lump/mass, mouth sores, nosebleeds, sore throat and trouble swallowing.   Eyes:  Negative for eye problems.  Respiratory:  Negative for cough and shortness of breath.   Cardiovascular:  Negative for chest pain, leg swelling and palpitations.  Gastrointestinal:  Negative for abdominal pain, constipation, diarrhea, nausea and vomiting.  Genitourinary:  Negative for bladder incontinence, difficulty urinating, dysuria, frequency, hematuria and nocturia.   Musculoskeletal:  Negative for arthralgias, back pain, flank pain, myalgias and neck pain.  Skin:  Negative for itching and rash.  Neurological:  Negative for dizziness, headaches and numbness.  Hematological:  Does not bruise/bleed easily.  Psychiatric/Behavioral:  Negative for depression, sleep disturbance and suicidal ideas. The  patient is not nervous/anxious.   All other systems reviewed  and are negative.    VITALS:   There were no vitals taken for this visit.  Wt Readings from Last 3 Encounters:  04/24/23 176 lb 5.9 oz (80 kg)  04/05/23 180 lb (81.6 kg)  01/30/23 176 lb 9.4 oz (80.1 kg)    There is no height or weight on file to calculate BMI.  Performance status (ECOG): 1 - Symptomatic but completely ambulatory  PHYSICAL EXAM:   Physical Exam Vitals and nursing note reviewed. Exam conducted with a chaperone present.  Constitutional:      Appearance: Normal appearance.  Cardiovascular:     Rate and Rhythm: Normal rate and regular rhythm.     Pulses: Normal pulses.     Heart sounds: Normal heart sounds.  Pulmonary:     Effort: Pulmonary effort is normal.     Breath sounds: Normal breath sounds.  Abdominal:     Palpations: Abdomen is soft. There is no hepatomegaly, splenomegaly or mass.     Tenderness: There is no abdominal tenderness.  Musculoskeletal:     Right lower leg: No edema.     Left lower leg: No edema.  Lymphadenopathy:     Cervical: No cervical adenopathy.     Right cervical: No superficial, deep or posterior cervical adenopathy.    Left cervical: No superficial, deep or posterior cervical adenopathy.     Upper Body:     Right upper body: No supraclavicular or axillary adenopathy.     Left upper body: No supraclavicular or axillary adenopathy.  Neurological:     General: No focal deficit present.     Mental Status: He is alert and oriented to person, place, and time.  Psychiatric:        Mood and Affect: Mood normal.        Behavior: Behavior normal.     LABS:      Latest Ref Rng & Units 04/05/2023   12:41 PM 01/30/2023    1:21 PM 10/03/2022    2:09 PM  CBC  WBC 3.4 - 10.8 x10E3/uL 7.4  6.1  5.8   Hemoglobin 13.0 - 17.7 g/dL 40.9  81.1  91.4   Hematocrit 37.5 - 51.0 % 41.9  44.5  40.3   Platelets x10E3/uL CANCELED  173  170       Latest Ref Rng & Units 04/24/2023     1:41 PM 04/05/2023   12:41 PM 03/27/2023    1:36 PM  CMP  Glucose 70 - 99 mg/dL 93  54  68   BUN 8 - 23 mg/dL 19  14  22    Creatinine 0.61 - 1.24 mg/dL 7.82  9.56  2.13   Sodium 135 - 145 mmol/L 140  144  138   Potassium 3.5 - 5.1 mmol/L 3.6  4.3  3.6   Chloride 98 - 111 mmol/L 105  104  101   CO2 22 - 32 mmol/L 26  22  25    Calcium  8.9 - 10.3 mg/dL 8.9  9.0  8.9   Total Protein 6.5 - 8.1 g/dL 7.2  6.8  7.6   Total Bilirubin 0.0 - 1.2 mg/dL 0.6  0.3  0.5   Alkaline Phos 38 - 126 U/L 41  56  40   AST 15 - 41 U/L 15  12  15    ALT 0 - 44 U/L 10  6  8       No results found for: "CEA1", "CEA" / No results found for: "CEA1", "CEA" Lab Results  Component  Value Date   PSA1 <0.1 09/02/2021   No results found for: "CAN199" No results found for: "CAN125"  No results found for: "TOTALPROTELP", "ALBUMINELP", "A1GS", "A2GS", "BETS", "BETA2SER", "GAMS", "MSPIKE", "SPEI" No results found for: "TIBC", "FERRITIN", "IRONPCTSAT" No results found for: "LDH"   STUDIES:   No results found.

## 2023-05-22 ENCOUNTER — Inpatient Hospital Stay: Payer: Medicare PPO

## 2023-05-22 ENCOUNTER — Inpatient Hospital Stay: Payer: Medicare PPO | Admitting: Hematology

## 2023-05-28 ENCOUNTER — Inpatient Hospital Stay: Admitting: Hematology

## 2023-05-28 ENCOUNTER — Inpatient Hospital Stay: Attending: Nurse Practitioner

## 2023-05-28 ENCOUNTER — Inpatient Hospital Stay

## 2023-05-28 VITALS — BP 138/78 | HR 60 | Temp 97.0°F | Wt 169.5 lb

## 2023-05-28 DIAGNOSIS — Z79818 Long term (current) use of other agents affecting estrogen receptors and estrogen levels: Secondary | ICD-10-CM | POA: Diagnosis not present

## 2023-05-28 DIAGNOSIS — C61 Malignant neoplasm of prostate: Secondary | ICD-10-CM | POA: Diagnosis not present

## 2023-05-28 DIAGNOSIS — Z7985 Long-term (current) use of injectable non-insulin antidiabetic drugs: Secondary | ICD-10-CM | POA: Insufficient documentation

## 2023-05-28 DIAGNOSIS — C7951 Secondary malignant neoplasm of bone: Secondary | ICD-10-CM | POA: Diagnosis not present

## 2023-05-28 DIAGNOSIS — I10 Essential (primary) hypertension: Secondary | ICD-10-CM | POA: Insufficient documentation

## 2023-05-28 DIAGNOSIS — C778 Secondary and unspecified malignant neoplasm of lymph nodes of multiple regions: Secondary | ICD-10-CM | POA: Diagnosis not present

## 2023-05-28 DIAGNOSIS — F17210 Nicotine dependence, cigarettes, uncomplicated: Secondary | ICD-10-CM | POA: Diagnosis not present

## 2023-05-28 DIAGNOSIS — Z192 Hormone resistant malignancy status: Secondary | ICD-10-CM | POA: Insufficient documentation

## 2023-05-28 DIAGNOSIS — E785 Hyperlipidemia, unspecified: Secondary | ICD-10-CM | POA: Insufficient documentation

## 2023-05-28 DIAGNOSIS — E119 Type 2 diabetes mellitus without complications: Secondary | ICD-10-CM | POA: Insufficient documentation

## 2023-05-28 DIAGNOSIS — Z79899 Other long term (current) drug therapy: Secondary | ICD-10-CM | POA: Insufficient documentation

## 2023-05-28 DIAGNOSIS — Z809 Family history of malignant neoplasm, unspecified: Secondary | ICD-10-CM | POA: Diagnosis not present

## 2023-05-28 DIAGNOSIS — Z7984 Long term (current) use of oral hypoglycemic drugs: Secondary | ICD-10-CM | POA: Insufficient documentation

## 2023-05-28 LAB — COMPREHENSIVE METABOLIC PANEL WITH GFR
ALT: 9 U/L (ref 0–44)
AST: 15 U/L (ref 15–41)
Albumin: 3.6 g/dL (ref 3.5–5.0)
Alkaline Phosphatase: 41 U/L (ref 38–126)
Anion gap: 10 (ref 5–15)
BUN: 24 mg/dL — ABNORMAL HIGH (ref 8–23)
CO2: 24 mmol/L (ref 22–32)
Calcium: 9 mg/dL (ref 8.9–10.3)
Chloride: 101 mmol/L (ref 98–111)
Creatinine, Ser: 0.98 mg/dL (ref 0.61–1.24)
GFR, Estimated: >60 mL/min (ref >60)
Glucose, Bld: 83 mg/dL (ref 70–99)
Potassium: 3.7 mmol/L (ref 3.5–5.1)
Sodium: 135 mmol/L (ref 135–145)
Total Bilirubin: 0.5 mg/dL (ref 0.0–1.2)
Total Protein: 7.5 g/dL (ref 6.5–8.1)

## 2023-05-28 LAB — CBC WITH DIFFERENTIAL/PLATELET
Abs Immature Granulocytes: 0.01 10*3/uL (ref 0.00–0.07)
Basophils Absolute: 0 10*3/uL (ref 0.0–0.1)
Basophils Relative: 1 %
Eosinophils Absolute: 0.3 10*3/uL (ref 0.0–0.5)
Eosinophils Relative: 4 %
HCT: 42.4 % (ref 39.0–52.0)
Hemoglobin: 14.3 g/dL (ref 13.0–17.0)
Immature Granulocytes: 0 %
Lymphocytes Relative: 29 %
Lymphs Abs: 1.9 10*3/uL (ref 0.7–4.0)
MCH: 35.4 pg — ABNORMAL HIGH (ref 26.0–34.0)
MCHC: 33.7 g/dL (ref 30.0–36.0)
MCV: 105 fL — ABNORMAL HIGH (ref 80.0–100.0)
Monocytes Absolute: 0.4 10*3/uL (ref 0.1–1.0)
Monocytes Relative: 6 %
Neutro Abs: 3.9 10*3/uL (ref 1.7–7.7)
Neutrophils Relative %: 60 %
Platelets: 187 10*3/uL (ref 150–400)
RBC: 4.04 MIL/uL — ABNORMAL LOW (ref 4.22–5.81)
RDW: 14.8 % (ref 11.5–15.5)
WBC: 6.5 10*3/uL (ref 4.0–10.5)
nRBC: 0 % (ref 0.0–0.2)

## 2023-05-28 LAB — PSA: Prostatic Specific Antigen: 0.01 ng/mL (ref 0.00–4.00)

## 2023-05-28 MED ORDER — DENOSUMAB 120 MG/1.7ML ~~LOC~~ SOLN
120.0000 mg | Freq: Once | SUBCUTANEOUS | Status: AC
  Administered 2023-05-28: 120 mg via SUBCUTANEOUS
  Filled 2023-05-28: qty 1.7

## 2023-05-28 MED ORDER — LEUPROLIDE ACETATE (4 MONTH) 30 MG ~~LOC~~ KIT
30.0000 mg | PACK | Freq: Once | SUBCUTANEOUS | Status: AC
  Administered 2023-05-28: 30 mg via SUBCUTANEOUS
  Filled 2023-05-28: qty 30

## 2023-05-28 NOTE — Progress Notes (Signed)
 [facility] [address] [place] [address]    Clinic Day:  05/28/2023  Referring physician: Susanna Epley, [name]  Patient Care Team: Susanna Epley, [name] as PCP - General (General Practice) Paulett Boros, MD as Medical Oncologist (Medical Oncology)   ASSESSMENT & PLAN:   Assessment: 1.  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 - CT CAP (03/24/2020): Mildly improved retroperitoneal and left pelvic sidewall adenopathy but with new and substantially increased sclerosis metastatic lesions.  Upper normal partially calcified subcarinal lymph nodes. - Xtandi  160 mg daily started on 03/01/2020.   2.  Social/family history: - Lives at home with his wife.  Independent of ADLs and IADLs.  Does gardening.  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 - Current active smoker, 3 to 4 cigarettes/day.  Smoked 1 pack/day for more than 30 years. - Father and paternal uncle had cancers, type not known to the patient.    Plan: 1.  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  He denies any falls or seizure activity.  He continues to lose weight as he is on Ozempic . - Labs today: Normal LFTs and creatinine.  CBC was grossly normal.  PSA is pending.  Last PSA was undetectable in January. - Continue enzalutamide  160 mg daily.  He will receive Eligard  30 Mg today.  RTC 4 months for follow-up with repeat labs.   2.  Metastatic bone disease: - Denosumab  started on 10/12/2022. - He does  not report any adverse effects.  Calcium  is 9.0 today.  No jaw pain reported.  Proceed with denosumab  today and monthly.  No orders of the defined types were placed in this encounter.     [name],acting as a Neurosurgeon for Paulett Boros, MD.,have documented all relevant documentation on the behalf of Paulett Boros, MD,as directed by  Paulett Boros, MD while in the presence of Paulett Boros, MD.  I, Paulett Boros MD, have reviewed the above documentation for accuracy and completeness, and I agree with the above.     Paulett Boros, MD   5/19/20252:32 PM  CHIEF COMPLAINT:   Diagnosis: metastatic prostate cancer   Cancer Staging  Secondary malignant neoplasm of bone [facility]) Staging form: Bone - Appendicular Skeleton, Trunk, Skull, and Facial Bones, AJCC 8th Edition - Clinical: Stage IVB (cTX, cNX, pM1b) - Signed by Renna Cary, MD on 04/29/2020    Prior Therapy: 1. ADT (Firmagon, Lupron ) 11/2011 - 12/2013 2. abiraterone  08/2016 - 01/2020  Current Therapy:  Xtandi ; ADT (Eligard )   HISTORY OF PRESENT ILLNESS:   Oncology History  Secondary malignant neoplasm of bone (HCC)  11/25/2019 Initial Diagnosis   Secondary malignant neoplasm of bone (HCC)   04/29/2020 Cancer Staging   Staging form: Bone - Appendicular Skeleton, Trunk, Skull, and Facial Bones, AJCC 8th Edition - Clinical: Stage IVB (cTX, cNX, pM1b) - Signed by Renna Cary, MD on 04/29/2020      INTERVAL HISTORY:   Kyle George is a 73 y.o. male presenting to clinic today for follow up  of metastatic prostate cancer. He was last seen by me on 01/30/23.  Today, he states that he is doing well overall. His appetite level is at 40%. His energy level is at 80%.   PAST MEDICAL HISTORY:   Past Medical History: Past Medical History:  Diagnosis Date   Cancer of prostate (HCC)    Diabetes mellitus without complication (HCC)    Hyperlipidemia    Hypertension     Surgical  History: Past Surgical History:  Procedure Laterality Date   COLONOSCOPY     HEMORRHOID SURGERY      Social History: Social History   Socioeconomic History   Marital status: Married    Spouse name: Not on file   Number of children: Not on file   Years of education: Not on file   Highest education level: Not on file  Occupational History   Not on file  Tobacco Use   Smoking status: Some Days    Current packs/day: 0.25    Average packs/day: 0.3 packs/day for 50.0 years (12.5 ttl pk-yrs)    Types: Cigarettes   Smokeless tobacco: Former    Types: Chew    Quit date: 1975   Tobacco comments:    smoked about 1/2 PPD for about 10 years; now at 4-5 cigarettes per day   Vaping Use   Vaping status: Never Used  Substance and Sexual Activity   Alcohol use: Yes    Comment: occassionally   Drug use: Never   Sexual activity: Yes  Other Topics Concern   Not on file  Social History Narrative   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   Social Drivers of Corporate investment banker Strain: Low Risk  (08/23/2022)   Overall Financial Resource Strain (CARDIA)    Difficulty of Paying Living Expenses: Not hard at all  Food Insecurity: No Food Insecurity (08/23/2022)   Hunger Vital Sign    Worried About Running Out of Food in the Last Year: Never true    Ran Out of Food in the Last Year: Never true  Transportation Needs: No Transportation Needs (08/23/2022)   PRAPARE - Administrator, Civil Service (Medical): No    Lack of Transportation (Non-Medical): No  Physical Activity: Sufficiently Active (08/23/2022)   Exercise Vital Sign    Days of Exercise per Week: 6 days    Minutes of Exercise per Session: 90 min  Stress: No Stress Concern Present (08/23/2022)   [employer] of Occupational Health - Occupational Stress Questionnaire    Feeling of Stress : Not at all  Social Connections: Moderately Integrated (08/23/2022)   Social Connection and Isolation  Panel [NHANES]    Frequency of Communication with Friends and Family: More than three times a week    Frequency of Social Gatherings with Friends and Family: Once a week    Attends Religious Services: More than 4 times per year    Active Member of [employer] or Organizations: No    Attends Banker Meetings: Never    Marital Status: Married  Catering manager Violence: Not At Risk (08/23/2022)   Humiliation, Afraid, Rape, and Kick questionnaire    Fear of Current or Ex-Partner: No    Emotionally Abused: No    Physically Abused: No    Sexually Abused: No    Family History: Family History  Problem Relation Age of Onset   Hypertension Mother    Diabetes Mother    Cancer Father  He is unsure of what type of cancer but it was not prostate cancer   Cancer Paternal Uncle        Pt is unsure of what type of cancer   Colon cancer Neg Hx    Esophageal cancer Neg Hx    Stomach cancer Neg Hx    Rectal cancer Neg Hx     Current Medications:  Current Outpatient Medications:    aspirin 81 MG tablet, Take 81 mg by mouth daily., Disp: , Rfl:    blood glucose meter kit and supplies KIT, Dispense based on patient and insurance preference. Use up to four times daily as directed., Disp: 1 each, Rfl: 0   Blood Pressure Monitoring (BLOOD PRESSURE CUFF) MISC, by Does not apply route. Check blood pressure at least 4 times per week 3 hours after taking medication, Disp: , Rfl:    Calcium  Carbonate-Vitamin D  (CALCIUM  600+D PO), Take 1 tablet by mouth daily. Taking calcium  + D3, Disp: , Rfl:    carvedilol  (COREG ) 6.25 MG tablet, TAKE 1 TABLET BY MOUTH TWICE A DAY WITH FOOD, Disp: 180 tablet, Rfl: 1   cholecalciferol (VITAMIN D ) 1000 UNITS tablet, Take 5,000 Units by mouth daily., Disp: , Rfl:    enzalutamide  (XTANDI ) 40 MG tablet, TAKE 4 TABLETS (160 MG TOTAL) BY MOUTH DAILY., Disp: 120 tablet, Rfl: 2   glucose blood test strip, 1 each by Other route as needed for other. Use as instructed to  check blood sugars, Disp: , Rfl:    Lancets (ONETOUCH DELICA PLUS LANCET33G) MISC, by Does not apply route. Use as directed to check blood  Sugars 2 times per day dx E11.65, Disp: , Rfl:    Multiple Vitamin (MULTIVITAMIN WITH MINERALS) TABS, Take 1 tablet by mouth daily., Disp: , Rfl:    NON FORMULARY, Take 1 tablet by mouth daily. Thyroid   action, Disp: , Rfl:    Olmesartan -amLODIPine -HCTZ 40-10-25 MG TABS, TAKE 1 TABLET BY MOUTH EVERY DAY, Disp: 90 tablet, Rfl: 1   Omega-3 Fatty Acids (FISH OIL PO), Take 1 tablet by mouth once., Disp: , Rfl:    pioglitazone -metformin  (ACTOPLUS MET ) 15-850 MG tablet, Take 1 tablet by mouth 2 (two) times daily., Disp: 180 tablet, Rfl: 1   rosuvastatin  (CRESTOR ) 10 MG tablet, TAKE 1 TABLET BY MOUTH EVERY DAY, Disp: 90 tablet, Rfl: 1   Semaglutide ,0.25 or 0.5MG /DOS, (OZEMPIC , 0.25 OR 0.5 MG/DOSE,) 2 MG/3ML SOPN, INJECT 0.25 MG WEEKLY FOR 4 WEEKS, THEN INCREASE TO 0.5 MG WEEKLY, Disp: 3 mL, Rfl: 2   spironolactone  (ALDACTONE ) 25 MG tablet, TAKE 1 TABLET (25 MG TOTAL) BY MOUTH DAILY., Disp: 90 tablet, Rfl: 1 No current facility-administered medications for this visit.  Facility-Administered Medications Ordered in Other Visits:    denosumab  (XGEVA ) injection 120 mg, 120 mg, Subcutaneous, Once, Nam Vossler, MD   Leuprolide  Acetate (4 Month) (ELIGARD ) injection 30 mg, 30 mg, Subcutaneous, Once, Maresha Anastos, MD   Allergies: No Known Allergies  REVIEW OF SYSTEMS:   Review of Systems  Constitutional:  Negative for chills, fatigue and fever.  HENT:   Negative for lump/mass, mouth sores, nosebleeds, sore throat and trouble swallowing.   Eyes:  Negative for eye problems.  Respiratory:  Negative for cough and shortness of breath.   Cardiovascular:  Negative for chest pain, leg swelling and palpitations.  Gastrointestinal:  Negative for abdominal pain, constipation, diarrhea, nausea and vomiting.  Genitourinary:  Negative for bladder incontinence,  difficulty urinating, dysuria, frequency, hematuria and nocturia.   Musculoskeletal:  Negative for arthralgias, back pain, flank pain, myalgias and neck pain.  Skin:  Negative for itching and rash.  Neurological:  Negative for dizziness, headaches and numbness.  Hematological:  Does not bruise/bleed easily.  Psychiatric/Behavioral:  Negative for depression, sleep disturbance and suicidal ideas. The patient is not nervous/anxious.   All other systems reviewed and are negative.    VITALS:   Blood pressure 138/78, pulse 60, temperature (!) 97 F (36.1 C), temperature source Tympanic, weight 169 lb 8.5 oz (76.9 kg), SpO2 100%.  Wt Readings from Last 3 Encounters:  05/28/23 169 lb 8.5 oz (76.9 kg)  04/24/23 176 lb 5.9 oz (80 kg)  04/05/23 180 lb (81.6 kg)    Body mass index is 26.39 kg/m.  Performance status (ECOG): 1 - Symptomatic but completely ambulatory  PHYSICAL EXAM:   Physical Exam Vitals and nursing note reviewed. Exam conducted with a chaperone present.  Constitutional:      Appearance: Normal appearance.  Cardiovascular:     Rate and Rhythm: Normal rate and regular rhythm.     Pulses: Normal pulses.     Heart sounds: Normal heart sounds.  Pulmonary:     Effort: Pulmonary effort is normal.     Breath sounds: Normal breath sounds.  Abdominal:     Palpations: Abdomen is soft. There is no hepatomegaly, splenomegaly or mass.     Tenderness: There is no abdominal tenderness.  Musculoskeletal:     Right lower leg: No edema.     Left lower leg: No edema.  Lymphadenopathy:     Cervical: No cervical adenopathy.     Right cervical: No superficial, deep or posterior cervical adenopathy.    Left cervical: No superficial, deep or posterior cervical adenopathy.     Upper Body:     Right upper body: No supraclavicular or axillary adenopathy.     Left upper body: No supraclavicular or axillary adenopathy.  Neurological:     General: No focal deficit present.     Mental Status:  He is alert and oriented to person, place, and time.  Psychiatric:        Mood and Affect: Mood normal.        Behavior: Behavior normal.     LABS:      Latest Ref Rng & Units 05/28/2023   12:55 PM 04/05/2023   12:41 PM 01/30/2023    1:21 PM  CBC  WBC 4.0 - 10.5 K/uL 6.5  7.4  6.1   Hemoglobin 13.0 - 17.0 g/dL 16.1  09.6  04.5   Hematocrit 39.0 - 52.0 % 42.4  41.9  44.5   Platelets 150 - 400 K/uL 187  CANCELED  173       Latest Ref Rng & Units 05/28/2023   12:55 PM 04/24/2023    1:41 PM 04/05/2023   12:41 PM  CMP  Glucose 70 - 99 mg/dL 83  93  54   BUN 8 - 23 mg/dL 24  19  14    Creatinine 0.61 - 1.24 mg/dL 4.09  8.11  9.14   Sodium 135 - 145 mmol/L 135  140  144   Potassium 3.5 - 5.1 mmol/L 3.7  3.6  4.3   Chloride 98 - 111 mmol/L 101  105  104   CO2 22 - 32 mmol/L 24  26  22    Calcium  8.9 - 10.3 mg/dL 9.0  8.9  9.0   Total Protein 6.5 - 8.1 g/dL 7.5  7.2  6.8   Total Bilirubin  0.0 - 1.2 mg/dL 0.5  0.6  0.3   Alkaline Phos 38 - 126 U/L 41  41  56   AST 15 - 41 U/L 15  15  12    ALT 0 - 44 U/L 9  10  6       No results found for: "CEA1", "CEA" / No results found for: "CEA1", "CEA" Lab Results  Component Value Date   PSA1 <0.1 09/02/2021   No results found for: "ZOX096" No results found for: "CAN125"  No results found for: "TOTALPROTELP", "ALBUMINELP", "A1GS", "A2GS", "BETS", "BETA2SER", "GAMS", "MSPIKE", "SPEI" No results found for: "TIBC", "FERRITIN", "IRONPCTSAT" No results found for: "LDH"   STUDIES:   No results found.

## 2023-05-28 NOTE — Progress Notes (Signed)
 Patient is taking Xtandi as prescribed.  He has not missed any doses and reports no side effects at this time.

## 2023-05-28 NOTE — Progress Notes (Signed)
 Kyle George presents today for injection per the provider's orders.  Eligard /Xgeva  administration without incident; injection site WNL; see MAR for injection details.Patient denies any tooth or jaw pain. No recent or future dental appointments at this time.Patient reports taking Calcium /Vit D supplements as directed.  Patient tolerated procedure well and without incident.  No questions or complaints noted at this time.   Discharged from clinic ambulatory in stable condition. Alert and oriented x 3. F/U with [facility] as scheduled.

## 2023-05-28 NOTE — Patient Instructions (Signed)
 [facility] [place] - A DEPT OF [facility]  Discharge Instructions: Thank you for choosing [facility] to provide your oncology and hematology care.  If you have a lab appointment with the Cancer Center - please note that after April 8th, 2024, all labs will be drawn in the cancer center.  You do not have to check in or register with the main entrance as you have in the past but will complete your check-in in the cancer center.  Wear comfortable clothing and clothing appropriate for easy access to any Portacath or PICC line.   We strive to give you quality time with your provider. You may need to reschedule your appointment if you arrive late (15 or more minutes).  Arriving late affects you and other patients whose appointments are after yours.  Also, if you miss three or more appointments without notifying the office, you may be dismissed from the clinic at the provider's discretion.      For prescription refill requests, have your pharmacy contact our office and allow 72 hours for refills to be completed.    Today you received the following chemotherapy and/or immunotherapy agents Eliard/Xgeva  120 mg    To help prevent nausea and vomiting after your treatment, we encourage you to take your nausea medication as directed.  BELOW ARE SYMPTOMS THAT SHOULD BE REPORTED IMMEDIATELY: *FEVER GREATER THAN 100.4 F (38 C) OR HIGHER *CHILLS OR SWEATING *NAUSEA AND VOMITING THAT IS NOT CONTROLLED WITH YOUR NAUSEA MEDICATION *UNUSUAL SHORTNESS OF BREATH *UNUSUAL BRUISING OR BLEEDING *URINARY PROBLEMS (pain or burning when urinating, or frequent urination) *BOWEL PROBLEMS (unusual diarrhea, constipation, pain near the anus) TENDERNESS IN MOUTH AND THROAT WITH OR WITHOUT PRESENCE OF ULCERS (sore throat, sores in mouth, or a toothache) UNUSUAL RASH, SWELLING OR PAIN  UNUSUAL VAGINAL DISCHARGE OR ITCHING   Items with * indicate a potential emergency and should be  followed up as soon as possible or go to the Emergency Department if any problems should occur.  Please show the CHEMOTHERAPY ALERT CARD or IMMUNOTHERAPY ALERT CARD at check-in to the Emergency Department and triage nurse.  Should you have questions after your visit or need to cancel or reschedule your appointment, please contact [facility] [place] - A DEPT OF [facility] [phone]  and follow the prompts.  Office hours are 8:00 a.m. to 4:30 p.m. Monday - Friday. Please note that voicemails left after 4:00 p.m. may not be returned until the following business day.  We are closed weekends and major holidays. You have access to a nurse at all times for urgent questions. Please call the main number to the clinic [phone] and follow the prompts.  For any non-urgent questions, you may also contact your provider using MyChart. We now offer e-Visits for anyone 38 and older to request care online for non-urgent symptoms. For details visit [email or web address].   Also download the MyChart app! Go to the app store, search "MyChart", open the app, select [place], and log in with your MyChart username and password.

## 2023-05-28 NOTE — Patient Instructions (Signed)
 [facility] at [facility] Discharge Instructions   You were seen and examined today by Dr. Cheree Cords.  He reviewed the results of your lab work which are normal/stable.   We will proceed with your Xgeva  injection today.   Return as scheduled.    Thank you for choosing [facility] at [facility] to provide your oncology and hematology care.  To afford each patient quality time with our provider, please arrive at least 15 minutes before your scheduled appointment time.   If you have a lab appointment with the Cancer Center please come in thru the Main Entrance and check in at the main information desk.  You need to re-schedule your appointment should you arrive 10 or more minutes late.  We strive to give you quality time with our providers, and arriving late affects you and other patients whose appointments are after yours.  Also, if you no show three or more times for appointments you may be dismissed from the clinic at the providers discretion.     Again, thank you for choosing [facility].  Our hope is that these requests will decrease the amount of time that you wait before being seen by our physicians.       _____________________________________________________________  Should you have questions after your visit to [facility], please contact our office at [phone] and follow the prompts.  Our office hours are 8:00 a.m. and 4:30 p.m. Monday - Friday.  Please note that voicemails left after 4:00 p.m. may not be returned until the following business day.  We are closed weekends and major holidays.  You do have access to a nurse 24-7, just call the main number to the clinic [phone] and do not press any options, hold on the line and a nurse will answer the phone.    For prescription refill requests, have your pharmacy contact our office and allow 72 hours.    Due to Covid, you will need to wear a mask upon  entering the hospital. If you do not have a mask, a mask will be given to you at the Main Entrance upon arrival. For doctor visits, patients may have 1 support person age 57 or older with them. For treatment visits, patients can not have anyone with them due to social distancing guidelines and our immunocompromised population.

## 2023-05-29 ENCOUNTER — Other Ambulatory Visit: Payer: Self-pay

## 2023-05-29 ENCOUNTER — Other Ambulatory Visit: Payer: Self-pay | Admitting: Pharmacy Technician

## 2023-05-29 NOTE — Progress Notes (Signed)
 Re-timing patient to 06/19/23. Started in [place] & CR to complete when patient  found he have an extra bottle that's unopened.

## 2023-06-19 ENCOUNTER — Other Ambulatory Visit: Payer: Self-pay

## 2023-06-19 ENCOUNTER — Other Ambulatory Visit: Payer: Self-pay | Admitting: Pharmacy Technician

## 2023-06-19 NOTE — Progress Notes (Signed)
 Specialty Pharmacy Refill Coordination Note  Kyle George is a 73 y.o. male contacted today regarding refills of specialty medication(s) Enzalutamide  (XTANDI )   Patient requested Delivery   Delivery date: 06/22/23   Verified address: [address]  [place]   Medication will be filled on 06/21/23.

## 2023-06-20 ENCOUNTER — Other Ambulatory Visit: Payer: Self-pay

## 2023-06-25 ENCOUNTER — Inpatient Hospital Stay

## 2023-06-25 ENCOUNTER — Inpatient Hospital Stay: Attending: Nurse Practitioner

## 2023-06-25 VITALS — BP 130/90 | HR 60 | Temp 97.2°F | Resp 16 | Wt 167.0 lb

## 2023-06-25 DIAGNOSIS — Z79818 Long term (current) use of other agents affecting estrogen receptors and estrogen levels: Secondary | ICD-10-CM | POA: Insufficient documentation

## 2023-06-25 DIAGNOSIS — E785 Hyperlipidemia, unspecified: Secondary | ICD-10-CM | POA: Insufficient documentation

## 2023-06-25 DIAGNOSIS — F17210 Nicotine dependence, cigarettes, uncomplicated: Secondary | ICD-10-CM | POA: Diagnosis not present

## 2023-06-25 DIAGNOSIS — E119 Type 2 diabetes mellitus without complications: Secondary | ICD-10-CM | POA: Insufficient documentation

## 2023-06-25 DIAGNOSIS — C61 Malignant neoplasm of prostate: Secondary | ICD-10-CM | POA: Diagnosis not present

## 2023-06-25 DIAGNOSIS — Z79899 Other long term (current) drug therapy: Secondary | ICD-10-CM | POA: Insufficient documentation

## 2023-06-25 DIAGNOSIS — Z7985 Long-term (current) use of injectable non-insulin antidiabetic drugs: Secondary | ICD-10-CM | POA: Diagnosis not present

## 2023-06-25 DIAGNOSIS — Z809 Family history of malignant neoplasm, unspecified: Secondary | ICD-10-CM | POA: Insufficient documentation

## 2023-06-25 DIAGNOSIS — Z192 Hormone resistant malignancy status: Secondary | ICD-10-CM | POA: Diagnosis not present

## 2023-06-25 DIAGNOSIS — C7951 Secondary malignant neoplasm of bone: Secondary | ICD-10-CM | POA: Diagnosis not present

## 2023-06-25 DIAGNOSIS — Z7984 Long term (current) use of oral hypoglycemic drugs: Secondary | ICD-10-CM | POA: Diagnosis not present

## 2023-06-25 DIAGNOSIS — C778 Secondary and unspecified malignant neoplasm of lymph nodes of multiple regions: Secondary | ICD-10-CM | POA: Diagnosis not present

## 2023-06-25 DIAGNOSIS — I10 Essential (primary) hypertension: Secondary | ICD-10-CM | POA: Insufficient documentation

## 2023-06-25 LAB — COMPREHENSIVE METABOLIC PANEL WITH GFR
ALT: 9 U/L (ref 0–44)
AST: 14 U/L — ABNORMAL LOW (ref 15–41)
Albumin: 3.4 g/dL — ABNORMAL LOW (ref 3.5–5.0)
Alkaline Phosphatase: 39 U/L (ref 38–126)
Anion gap: 12 (ref 5–15)
BUN: 27 mg/dL — ABNORMAL HIGH (ref 8–23)
CO2: 25 mmol/L (ref 22–32)
Calcium: 9 mg/dL (ref 8.9–10.3)
Chloride: 103 mmol/L (ref 98–111)
Creatinine, Ser: 0.96 mg/dL (ref 0.61–1.24)
GFR, Estimated: >60 mL/min (ref >60)
Glucose, Bld: 78 mg/dL (ref 70–99)
Potassium: 4.1 mmol/L (ref 3.5–5.1)
Sodium: 140 mmol/L (ref 135–145)
Total Bilirubin: 0.8 mg/dL (ref 0.0–1.2)
Total Protein: 7.4 g/dL (ref 6.5–8.1)

## 2023-06-25 MED ORDER — DENOSUMAB 120 MG/1.7ML ~~LOC~~ SOLN
120.0000 mg | Freq: Once | SUBCUTANEOUS | Status: AC
  Administered 2023-06-25: 120 mg via SUBCUTANEOUS
  Filled 2023-06-25: qty 1.7

## 2023-06-25 NOTE — Progress Notes (Signed)
 Pt here today for is xgeva  injection , patient has not taken bp med today therefore blood pressure is elevated. Patient states he will take it when he gets home.   Patient states he is taking his Calcium  and vitamin D  supplements. Denies any jaw pain and no major dental work being done.   Xgeva  injection given per orders. Patient tolerated it well without problems. Vitals stable and discharged home from clinic ambulatory. Follow up as scheduled.

## 2023-06-25 NOTE — Patient Instructions (Signed)
 [facility] [place] - A DEPT OF [facility]  Discharge Instructions: Thank you for choosing [facility] to provide your oncology and hematology care.  If you have a lab appointment with the Cancer Center - please note that after April 8th, 2024, all labs will be drawn in the cancer center.  You do not have to check in or register with the main entrance as you have in the past but will complete your check-in in the cancer center.  Wear comfortable clothing and clothing appropriate for easy access to any Portacath or PICC line.   We strive to give you quality time with your provider. You may need to reschedule your appointment if you arrive late (15 or more minutes).  Arriving late affects you and other patients whose appointments are after yours.  Also, if you miss three or more appointments without notifying the office, you may be dismissed from the clinic at the provider's discretion.      For prescription refill requests, have your pharmacy contact our office and allow 72 hours for refills to be completed.    Today you received the following injection : xgeva    To help prevent nausea and vomiting after your treatment, we encourage you to take your nausea medication as directed.  BELOW ARE SYMPTOMS THAT SHOULD BE REPORTED IMMEDIATELY: *FEVER GREATER THAN 100.4 F (38 C) OR HIGHER *CHILLS OR SWEATING *NAUSEA AND VOMITING THAT IS NOT CONTROLLED WITH YOUR NAUSEA MEDICATION *UNUSUAL SHORTNESS OF BREATH *UNUSUAL BRUISING OR BLEEDING *URINARY PROBLEMS (pain or burning when urinating, or frequent urination) *BOWEL PROBLEMS (unusual diarrhea, constipation, pain near the anus) TENDERNESS IN MOUTH AND THROAT WITH OR WITHOUT PRESENCE OF ULCERS (sore throat, sores in mouth, or a toothache) UNUSUAL RASH, SWELLING OR PAIN  UNUSUAL VAGINAL DISCHARGE OR ITCHING   Items with * indicate a potential emergency and should be followed up as soon as possible or go to the  Emergency Department if any problems should occur.  Please show the CHEMOTHERAPY ALERT CARD or IMMUNOTHERAPY ALERT CARD at check-in to the Emergency Department and triage nurse.  Should you have questions after your visit or need to cancel or reschedule your appointment, please contact [facility] [place] - A DEPT OF [facility] [phone]  and follow the prompts.  Office hours are 8:00 a.m. to 4:30 p.m. Monday - Friday. Please note that voicemails left after 4:00 p.m. may not be returned until the following business day.  We are closed weekends and major holidays. You have access to a nurse at all times for urgent questions. Please call the main number to the clinic [phone] and follow the prompts.  For any non-urgent questions, you may also contact your provider using MyChart. We now offer e-Visits for anyone 59 and older to request care online for non-urgent symptoms. For details visit [email or web address].   Also download the MyChart app! Go to the app store, search MyChart, open the app, select [place], and log in with your MyChart username and password.

## 2023-06-30 ENCOUNTER — Other Ambulatory Visit: Payer: Self-pay | Admitting: Nurse Practitioner

## 2023-07-05 ENCOUNTER — Other Ambulatory Visit: Payer: Self-pay | Admitting: Nurse Practitioner

## 2023-07-05 DIAGNOSIS — I10 Essential (primary) hypertension: Secondary | ICD-10-CM

## 2023-07-05 DIAGNOSIS — C61 Malignant neoplasm of prostate: Secondary | ICD-10-CM

## 2023-07-15 ENCOUNTER — Other Ambulatory Visit: Payer: Self-pay | Admitting: Nurse Practitioner

## 2023-07-15 DIAGNOSIS — E782 Mixed hyperlipidemia: Secondary | ICD-10-CM

## 2023-07-17 ENCOUNTER — Other Ambulatory Visit: Payer: Self-pay | Admitting: *Deleted

## 2023-07-17 ENCOUNTER — Other Ambulatory Visit (HOSPITAL_COMMUNITY): Payer: Self-pay

## 2023-07-17 ENCOUNTER — Other Ambulatory Visit: Payer: Self-pay

## 2023-07-17 ENCOUNTER — Other Ambulatory Visit: Payer: Self-pay | Admitting: Hematology

## 2023-07-17 DIAGNOSIS — C61 Malignant neoplasm of prostate: Secondary | ICD-10-CM

## 2023-07-17 MED ORDER — ENZALUTAMIDE 40 MG PO TABS
ORAL_TABLET | ORAL | 2 refills | Status: DC
  Filled 2023-07-17: qty 120, 30d supply, fill #0
  Filled 2023-08-16: qty 120, 30d supply, fill #1
  Filled 2023-09-17: qty 120, 30d supply, fill #2

## 2023-07-17 NOTE — Progress Notes (Signed)
 Specialty Pharmacy Refill Coordination Note  Kyle George is a 73 y.o. male contacted today regarding refills of specialty medication(s) Enzalutamide  (XTANDI )   Patient requested Delivery   Delivery date: 07/25/23   Verified address: [address]  [place]   Medication will be filled on 07/24/23. This fill date is pending response to refill request from provider. Patient is aware and if they have not received fill by intended date they must follow up with pharmacy.

## 2023-07-17 NOTE — Telephone Encounter (Signed)
 Refill for enzalutamide  approved.  Patient is tolerating and is to continue therapy at this time.

## 2023-07-23 ENCOUNTER — Inpatient Hospital Stay: Attending: Nurse Practitioner

## 2023-07-23 ENCOUNTER — Inpatient Hospital Stay

## 2023-07-23 VITALS — BP 135/88 | HR 69 | Temp 96.9°F | Resp 18

## 2023-07-23 DIAGNOSIS — C61 Malignant neoplasm of prostate: Secondary | ICD-10-CM | POA: Diagnosis not present

## 2023-07-23 DIAGNOSIS — C7951 Secondary malignant neoplasm of bone: Secondary | ICD-10-CM | POA: Diagnosis not present

## 2023-07-23 DIAGNOSIS — C775 Secondary and unspecified malignant neoplasm of intrapelvic lymph nodes: Secondary | ICD-10-CM | POA: Insufficient documentation

## 2023-07-23 LAB — COMPREHENSIVE METABOLIC PANEL WITH GFR
ALT: 10 U/L (ref 0–44)
AST: 15 U/L (ref 15–41)
Albumin: 3.4 g/dL — ABNORMAL LOW (ref 3.5–5.0)
Alkaline Phosphatase: 42 U/L (ref 38–126)
Anion gap: 8 (ref 5–15)
BUN: 25 mg/dL — ABNORMAL HIGH (ref 8–23)
CO2: 26 mmol/L (ref 22–32)
Calcium: 8.9 mg/dL (ref 8.9–10.3)
Chloride: 104 mmol/L (ref 98–111)
Creatinine, Ser: 1.12 mg/dL (ref 0.61–1.24)
GFR, Estimated: >60 mL/min (ref >60)
Glucose, Bld: 84 mg/dL (ref 70–99)
Potassium: 3.8 mmol/L (ref 3.5–5.1)
Sodium: 138 mmol/L (ref 135–145)
Total Bilirubin: 0.7 mg/dL (ref 0.0–1.2)
Total Protein: 7.3 g/dL (ref 6.5–8.1)

## 2023-07-23 MED ORDER — DENOSUMAB 120 MG/1.7ML ~~LOC~~ SOLN
120.0000 mg | Freq: Once | SUBCUTANEOUS | Status: AC
Start: 2023-07-23 — End: 2023-07-23
  Administered 2023-07-23: 120 mg via SUBCUTANEOUS
  Filled 2023-07-23: qty 1.7

## 2023-07-23 NOTE — Patient Instructions (Signed)
 [facility] - A DEPT OF [facility]  Discharge Instructions: Thank you for choosing [facility] to provide your oncology and hematology care.  If you have a lab appointment with the Cancer Center - please note that after April 8th, 2024, all labs will be drawn in the cancer center.  You do not have to check in or register with the main entrance as you have in the past but will complete your check-in in the cancer center.  Wear comfortable clothing and clothing appropriate for easy access to any Portacath or PICC line.   We strive to give you quality time with your provider. You may need to reschedule your appointment if you arrive late (15 or more minutes).  Arriving late affects you and other patients whose appointments are after yours.  Also, if you miss three or more appointments without notifying the office, you may be dismissed from the clinic at the provider's discretion.      For prescription refill requests, have your pharmacy contact our office and allow 72 hours for refills to be completed.    Today you received the following injection , xgeva    To help prevent nausea and vomiting after your treatment, we encourage you to take your nausea medication as directed.  BELOW ARE SYMPTOMS THAT SHOULD BE REPORTED IMMEDIATELY: *FEVER GREATER THAN 100.4 F (38 C) OR HIGHER *CHILLS OR SWEATING *NAUSEA AND VOMITING THAT IS NOT CONTROLLED WITH YOUR NAUSEA MEDICATION *UNUSUAL SHORTNESS OF BREATH *UNUSUAL BRUISING OR BLEEDING *URINARY PROBLEMS (pain or burning when urinating, or frequent urination) *BOWEL PROBLEMS (unusual diarrhea, constipation, pain near the anus) TENDERNESS IN MOUTH AND THROAT WITH OR WITHOUT PRESENCE OF ULCERS (sore throat, sores in mouth, or a toothache) UNUSUAL RASH, SWELLING OR PAIN  UNUSUAL VAGINAL DISCHARGE OR ITCHING   Items with * indicate a potential emergency and should be followed up as soon as possible or go to the  Emergency Department if any problems should occur.  Please show the CHEMOTHERAPY ALERT CARD or IMMUNOTHERAPY ALERT CARD at check-in to the Emergency Department and triage nurse.  Should you have questions after your visit or need to cancel or reschedule your appointment, please contact [facility]  - A DEPT OF [facility] [phone]  and follow the prompts.  Office hours are 8:00 a.m. to 4:30 p.m. Monday - Friday. Please note that voicemails left after 4:00 p.m. may not be returned until the following business day.  We are closed weekends and major holidays. You have access to a nurse at all times for urgent questions. Please call the main number to the clinic [phone] and follow the prompts.  For any non-urgent questions, you may also contact your provider using MyChart. We now offer e-Visits for anyone 41 and older to request care online for non-urgent symptoms. For details visit [email or web address].   Also download the MyChart app! Go to the app store, search MyChart, open the app, select [place], and log in with your MyChart username and password.

## 2023-07-23 NOTE — Progress Notes (Signed)
 Patient verified he was taking his vitamin D  and calcium .   Xgeva  injection given per orders. Patient tolerated it well without problems. Vitals stable and discharged home from clinic ambulatory. Follow up as scheduled.

## 2023-07-24 ENCOUNTER — Other Ambulatory Visit: Payer: Self-pay

## 2023-08-06 ENCOUNTER — Ambulatory Visit: Admitting: Nurse Practitioner

## 2023-08-06 ENCOUNTER — Encounter: Payer: Self-pay | Admitting: Nurse Practitioner

## 2023-08-06 VITALS — BP 120/78 | HR 60 | Temp 98.3°F | Ht 67.0 in | Wt 167.8 lb

## 2023-08-06 DIAGNOSIS — E782 Mixed hyperlipidemia: Secondary | ICD-10-CM | POA: Diagnosis not present

## 2023-08-06 DIAGNOSIS — E785 Hyperlipidemia, unspecified: Secondary | ICD-10-CM

## 2023-08-06 DIAGNOSIS — I700 Atherosclerosis of aorta: Secondary | ICD-10-CM | POA: Diagnosis not present

## 2023-08-06 DIAGNOSIS — Z794 Long term (current) use of insulin: Secondary | ICD-10-CM

## 2023-08-06 DIAGNOSIS — I119 Hypertensive heart disease without heart failure: Secondary | ICD-10-CM

## 2023-08-06 DIAGNOSIS — Z79899 Other long term (current) drug therapy: Secondary | ICD-10-CM | POA: Diagnosis not present

## 2023-08-06 DIAGNOSIS — E1169 Type 2 diabetes mellitus with other specified complication: Secondary | ICD-10-CM

## 2023-08-06 DIAGNOSIS — C61 Malignant neoplasm of prostate: Secondary | ICD-10-CM

## 2023-08-06 DIAGNOSIS — I10 Essential (primary) hypertension: Secondary | ICD-10-CM

## 2023-08-06 DIAGNOSIS — E11628 Type 2 diabetes mellitus with other skin complications: Secondary | ICD-10-CM

## 2023-08-06 DIAGNOSIS — Z1211 Encounter for screening for malignant neoplasm of colon: Secondary | ICD-10-CM

## 2023-08-06 NOTE — Progress Notes (Addendum)
 I,Kyle George, CMA,acting as a Neurosurgeon for Kyle Ada, [name].,have documented all relevant documentation on the behalf of Kyle Ada, [name],as directed by  Kyle Ada, [name] while in the presence of Kyle Ada, [name].  Subjective:  Patient ID: Kyle George , male    DOB: [birth year] , 73 y.o.   MRN: [id number]  Chief Complaint  Patient presents with   Diabetes    Patient presents today for diabetes & bpc. She reports compliance with medications. Denies headache, chest pain & sob. He asks for referral to complete colonoscopy.    Hypertension    HPI  Discussed the use of AI scribe software for clinical note transcription with the patient, who gave verbal consent to proceed.  History of Present Illness Kyle George is a 73 year old male with hypertension and diabetes who presents for follow-up.  He has experienced elevated blood pressure readings during recent checks, including 181/unknown and 135/88 on July 23, 2023, and 130/90 on June 25, 2023. His blood pressure is monitored monthly. He acknowledges not staying adequately hydrated, particularly while spending extended periods in his garden during hot weather.  He has checked his blood sugar a couple of times but does not provide specific readings.  He has not seen any other doctors besides his oncologist  for prostate cancer, continues on Xtandi  infusions monthly recently and has not had a colonoscopy since 2021.  He spends a significant amount of time gardening and has recently acquired a large dog, which he keeps inside and considers a form of exercise.   Patient presents today for a BP and DM follow up, patient repots compliance with medications.    Diabetes He presents for his follow-up diabetic visit. He has type 2 diabetes mellitus. There are no hypoglycemic associated symptoms. Pertinent negatives for hypoglycemia include no dizziness or headaches. There are no diabetic associated symptoms. Pertinent negatives for  diabetes include no chest pain, no fatigue, no polydipsia, no polyphagia and no polyuria. There are no hypoglycemic complications. Diabetic complications include heart disease. Risk factors for coronary artery disease include male sex and sedentary lifestyle. Current diabetic treatment includes oral agent (dual therapy). He is compliant with treatment some of the time. He is following a generally healthy diet. When asked about meal planning, he reported none. He has not had a previous visit with a dietitian. 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 (He is unable to tell me his readings just says normally) He does not see a podiatrist.Eye exam is not current.  Hypertension This is a chronic problem. The current episode started more than 1 year ago. The problem is unchanged. The problem is controlled. Pertinent negatives include no chest pain, headaches, malaise/fatigue, palpitations or shortness of breath. There are no associated agents to hypertension. There are no known risk factors for coronary artery disease. Past treatments include ACE inhibitors. There are no compliance problems.  There is no history of kidney disease. There is no history of chronic renal disease.     Past Medical History:  Diagnosis Date   Cancer of prostate (HCC)    Diabetes mellitus without complication (HCC)    Hyperlipidemia    Hypertension      Family History  Problem Relation Age of Onset   Hypertension Mother    Diabetes Mother    Cancer Father        He is unsure of what type of cancer but it was not prostate cancer   Cancer Paternal Uncle  Pt is unsure of what type of cancer   Colon cancer Neg Hx    Esophageal cancer Neg Hx    Stomach cancer Neg Hx    Rectal cancer Neg Hx      Current Outpatient Medications:    aspirin 81 MG tablet, Take 81 mg by mouth daily., Disp: , Rfl:    blood glucose meter kit and supplies KIT, Dispense based on patient and insurance preference. Use up to four times daily  as directed. (Patient not taking: Reported on 08/29/2023), Disp: 1 each, Rfl: 0   Blood Pressure Monitoring (BLOOD PRESSURE CUFF) MISC, by Does not apply route. Check blood pressure at least 4 times per week 3 hours after taking medication (Patient not taking: Reported on 08/29/2023), Disp: , Rfl:    Calcium  Carbonate-Vitamin D  (CALCIUM  600+D PO), Take 1 tablet by mouth daily. Taking calcium  + D3, Disp: , Rfl:    carvedilol  (COREG ) 6.25 MG tablet, TAKE 1 TABLET BY MOUTH TWICE A DAY WITH FOOD, Disp: 180 tablet, Rfl: 1   cholecalciferol (VITAMIN D ) 1000 UNITS tablet, Take 5,000 Units by mouth daily., Disp: , Rfl:    enzalutamide  (XTANDI ) 40 MG tablet, TAKE 4 TABLETS (160 MG TOTAL) BY MOUTH DAILY., Disp: 120 tablet, Rfl: 2   glucose blood test strip, 1 each by Other route as needed for other. Use as instructed to check blood sugars (Patient not taking: Reported on 08/29/2023), Disp: , Rfl:    Lancets (ONETOUCH DELICA PLUS LANCET33G) MISC, by Does not apply route. Use as directed to check blood  Sugars 2 times per day dx E11.65 (Patient not taking: Reported on 08/29/2023), Disp: , Rfl:    Multiple Vitamin (MULTIVITAMIN WITH MINERALS) TABS, Take 1 tablet by mouth daily., Disp: , Rfl:    NON FORMULARY, Take 1 tablet by mouth daily. Thyroid   action (Patient not taking: Reported on 08/29/2023), Disp: , Rfl:    Olmesartan -amLODIPine -HCTZ 40-10-25 MG TABS, TAKE 1 TABLET BY MOUTH EVERY DAY, Disp: 90 tablet, Rfl: 1   Omega-3 Fatty Acids (FISH OIL PO), Take 1 tablet by mouth once., Disp: , Rfl:    pioglitazone -metformin  (ACTOPLUS MET ) 15-850 MG tablet, Take 1 tablet by mouth 2 (two) times daily., Disp: 180 tablet, Rfl: 1   rosuvastatin  (CRESTOR ) 10 MG tablet, TAKE 1 TABLET BY MOUTH EVERY DAY, Disp: 90 tablet, Rfl: 1   Semaglutide ,0.25 or 0.5MG /DOS, (OZEMPIC , 0.25 OR 0.5 MG/DOSE,) 2 MG/3ML SOPN, INJECT 0.25 MG WEEKLY FOR 4 WEEKS, THEN INCREASE TO 0.5 MG WEEKLY, Disp: 3 mL, Rfl: 2   spironolactone  (ALDACTONE ) 25 MG  tablet, TAKE 1 TABLET (25 MG TOTAL) BY MOUTH DAILY., Disp: 90 tablet, Rfl: 1   No Known Allergies   Review of Systems  Constitutional: Negative.  Negative for fatigue and malaise/fatigue.  Respiratory: Negative.  Negative for shortness of breath.   Cardiovascular:  Negative for chest pain and palpitations.  Gastrointestinal: Negative.   Endocrine: Negative.  Negative for polydipsia, polyphagia and polyuria.  Skin: Negative.   Allergic/Immunologic: Negative.   Neurological:  Negative for dizziness and headaches.  Hematological: Negative.      Today's Vitals   08/06/23 1200  BP: 120/78  Pulse: 60  Temp: 98.3 F (36.8 C)  SpO2: 98%  Weight: 167 lb 12.8 oz (76.1 kg)  Height: 5' 7 (1.702 m)   Body mass index is 26.28 kg/m.  Wt Readings from Last 3 Encounters:  08/29/23 169 lb 6.4 oz (76.8 kg)  08/20/23 166 lb 3.6 oz (75.4 kg)  08/06/23 167 lb 12.8 oz (76.1 kg)     Objective:  Physical Exam Vitals and nursing note reviewed.  Constitutional:      General: He is not in acute distress.    Appearance: Normal appearance.  HENT:     Head: Normocephalic.     Mouth/Throat:     Dentition: Has dentures (upper and lower).  Eyes:     Pupils: Pupils are equal, round, and reactive to light.  Cardiovascular:     Rate and Rhythm: Normal rate and regular rhythm.     Pulses: Normal pulses.     Heart sounds: Normal heart sounds. No murmur heard. Pulmonary:     Effort: Pulmonary effort is normal. No respiratory distress.     Breath sounds: Normal breath sounds. No wheezing.  Genitourinary:    Comments: Being followed by urology Skin:    General: Skin is warm and dry.     Capillary Refill: Capillary refill takes less than 2 seconds.  Neurological:     General: No focal deficit present.     Mental Status: He is alert and oriented to person, place, and time.     Cranial Nerves: No cranial nerve deficit.     Motor: No weakness.  Psychiatric:        Mood and Affect: Mood normal.         Behavior: Behavior normal.        Thought Content: Thought content normal.        Judgment: Judgment normal.      Assessment And Plan:  Type 2 diabetes mellitus with hyperlipidemia (HCC) Assessment & Plan: Hemoglobin A1c is stable.  Continue current medications.  Tolerating well. Plans to assess long-term glucose control with A1c. - Order A1c test.  Orders: -     Hemoglobin A1c  Hypertensive heart disease without heart failure Assessment & Plan: Blood pressure improved to 120/78 mmHg. Discussed dehydration risks and its impact on hypertension. - Advise hydration, especially in hot weather. - Educate on dehydration signs and effects on blood pressure.   Aortic atherosclerosis (HCC) Assessment & Plan: Continue statin  Orders: -     Lipid panel  Prostate cancer [facility]) Assessment & Plan: Continue f/u with Urology   Other long term (current) drug therapy  Encounter for screening colonoscopy -     Ambulatory referral to Gastroenterology    Return for 4 month dm.  Patient was given opportunity to ask questions. Patient verbalized understanding of the plan and was able to repeat key elements of the plan. All questions were answered to their satisfaction.  Kyle Ada, [name]  I, Kyle Ada, [name], have reviewed all documentation for this visit. The documentation on 08/06/23 for the exam, diagnosis, procedures, and orders are all accurate and complete.   IF YOU HAVE BEEN REFERRED TO A SPECIALIST, IT MAY TAKE 1-2 WEEKS TO SCHEDULE/PROCESS THE REFERRAL. IF YOU HAVE NOT HEARD FROM US /SPECIALIST IN TWO WEEKS, PLEASE GIVE US  A CALL AT [phone] X [phone].   THE PATIENT IS ENCOURAGED TO PRACTICE SOCIAL DISTANCING DUE TO THE COVID-19 PANDEMIC.

## 2023-08-06 NOTE — Patient Instructions (Signed)

## 2023-08-07 LAB — LIPID PANEL
Chol/HDL Ratio: 1.9 ratio (ref 0.0–5.0)
Cholesterol, Total: 143 mg/dL (ref 100–199)
HDL: 75 mg/dL (ref >39)
LDL Chol Calc (NIH): 56 mg/dL (ref 0–99)
Triglycerides: 59 mg/dL (ref 0–149)
VLDL Cholesterol Cal: 12 mg/dL (ref 5–40)

## 2023-08-07 LAB — HEMOGLOBIN A1C
Est. average glucose Bld gHb Est-mCnc: 105 mg/dL
Hgb A1c MFr Bld: 5.3 % (ref 4.8–5.6)

## 2023-08-16 ENCOUNTER — Other Ambulatory Visit: Payer: Self-pay

## 2023-08-16 NOTE — Progress Notes (Signed)
 Specialty Pharmacy Refill Coordination Note  Kyle George is a 73 y.o. male contacted today regarding refills of specialty medication(s) Enzalutamide  (XTANDI )   Patient requested Delivery   Delivery date: 08/20/23   Verified address: [address]  [place]   Medication will be filled on 08/17/23.

## 2023-08-20 ENCOUNTER — Inpatient Hospital Stay

## 2023-08-20 ENCOUNTER — Inpatient Hospital Stay: Attending: Physician Assistant

## 2023-08-20 VITALS — BP 128/65 | HR 61 | Temp 97.7°F | Resp 16 | Wt 166.2 lb

## 2023-08-20 DIAGNOSIS — C61 Malignant neoplasm of prostate: Secondary | ICD-10-CM | POA: Insufficient documentation

## 2023-08-20 DIAGNOSIS — Z79899 Other long term (current) drug therapy: Secondary | ICD-10-CM | POA: Diagnosis not present

## 2023-08-20 DIAGNOSIS — C7951 Secondary malignant neoplasm of bone: Secondary | ICD-10-CM | POA: Diagnosis not present

## 2023-08-20 LAB — COMPREHENSIVE METABOLIC PANEL WITH GFR
ALT: 8 U/L (ref 0–44)
AST: 16 U/L (ref 15–41)
Albumin: 3.2 g/dL — ABNORMAL LOW (ref 3.5–5.0)
Alkaline Phosphatase: 42 U/L (ref 38–126)
Anion gap: 10 (ref 5–15)
BUN: 22 mg/dL (ref 8–23)
CO2: 23 mmol/L (ref 22–32)
Calcium: 8.8 mg/dL — ABNORMAL LOW (ref 8.9–10.3)
Chloride: 104 mmol/L (ref 98–111)
Creatinine, Ser: 0.96 mg/dL (ref 0.61–1.24)
GFR, Estimated: >60 mL/min (ref >60)
Glucose, Bld: 84 mg/dL (ref 70–99)
Potassium: 3.9 mmol/L (ref 3.5–5.1)
Sodium: 137 mmol/L (ref 135–145)
Total Bilirubin: 0.7 mg/dL (ref 0.0–1.2)
Total Protein: 7 g/dL (ref 6.5–8.1)

## 2023-08-20 MED ORDER — DENOSUMAB 120 MG/1.7ML ~~LOC~~ SOLN
120.0000 mg | Freq: Once | SUBCUTANEOUS | Status: AC
Start: 2023-08-20 — End: 2023-08-20
  Administered 2023-08-20 (×2): 120 mg via SUBCUTANEOUS
  Filled 2023-08-20: qty 1.7

## 2023-08-20 NOTE — Progress Notes (Signed)
 Patient confirms that he is taking Calcium  and Vitamin D  at home. Labs reviewed. Patient tolerated Xgeva  injection with no complaints voiced.  Site clean and dry with no bruising or swelling noted at site.  See MAR for details.  Band aid applied.  Patient stable during and after injection.  Vss with discharge and left in satisfactory condition with no s/s of distress noted. All follow ups as scheduled.   Kyle George

## 2023-08-20 NOTE — Patient Instructions (Signed)
 Denosumab  Injection (Oncology) What is this medication? DENOSUMAB  (den oh SUE mab) prevents weakened bones caused by cancer. It may also be used to treat noncancerous bone tumors that cannot be removed by surgery. It can also be used to treat high calcium  levels in the blood caused by cancer. It works by blocking a protein that causes bones to break down quickly. This slows down the release of calcium  from bones, which lowers calcium  levels in your blood. It also makes your bones stronger and less likely to break (fracture). This medicine may be used for other purposes; ask your health care provider or pharmacist if you have questions. COMMON BRAND NAME(S): XGEVA  What should I tell my care team before I take this medication? They need to know if you have any of these conditions: Dental disease Having surgery or tooth extraction Infection Kidney disease Low levels of calcium  or vitamin D  in the blood Malnutrition On hemodialysis Skin conditions or sensitivity Thyroid  or parathyroid disease An unusual reaction to denosumab , other medications, foods, dyes, or preservatives Pregnant or trying to get pregnant Breast-feeding How should I use this medication? This medication is for injection under the skin. It is given by your care team in a hospital or clinic setting. A special MedGuide will be given to you before each treatment. Be sure to read this information carefully each time. Talk to your care team about the use of this medication in children. While it may be prescribed for children as young as 13 years for selected conditions, precautions do apply. Overdosage: If you think you have taken too much of this medicine contact a poison control center or emergency room at once. NOTE: This medicine is only for you. Do not share this medicine with others. What if I miss a dose? Keep appointments for follow-up doses. It is important not to miss your dose. Call your care team if you are unable to  keep an appointment. What may interact with this medication? Do not take this medication with any of the following: Other medications containing denosumab  This medication may also interact with the following: Medications that lower your chance of fighting infection Steroid medications, such as prednisone  or cortisone This list may not describe all possible interactions. Give your health care provider a list of all the medicines, herbs, non-prescription drugs, or dietary supplements you use. Also tell them if you smoke, drink alcohol, or use illegal drugs. Some items may interact with your medicine. What should I watch for while using this medication? Your condition will be monitored carefully while you are receiving this medication. You may need blood work while taking this medication. This medication may increase your risk of getting an infection. Call your care team for advice if you get a fever, chills, sore throat, or other symptoms of a cold or flu. Do not treat yourself. Try to avoid being around people who are sick. You should make sure you get enough calcium  and vitamin D  while you are taking this medication, unless your care team tells you not to. Discuss the foods you eat and the vitamins you take with your care team. Some people who take this medication have severe bone, joint, or muscle pain. This medication may also increase your risk for jaw problems or a broken thigh bone. Tell your care team right away if you have severe pain in your jaw, bones, joints, or muscles. Tell your care team if you have any pain that does not go away or that gets worse. Talk  to your care team if you may be pregnant. Serious birth defects can occur if you take this medication during pregnancy and for 5 months after the last dose. You will need a negative pregnancy test before starting this medication. Contraception is recommended while taking this medication and for 5 months after the last dose. Your care team  can help you find the option that works for you. What side effects may I notice from receiving this medication? Side effects that you should report to your care team as soon as possible: Allergic reactions--skin rash, itching, hives, swelling of the face, lips, tongue, or throat Bone, joint, or muscle pain Low calcium  level--muscle pain or cramps, confusion, tingling, or numbness in the hands or feet Osteonecrosis of the jaw--pain, swelling, or redness in the mouth, numbness of the jaw, poor healing after dental work, unusual discharge from the mouth, visible bones in the mouth Side effects that usually do not require medical attention (report to your care team if they continue or are bothersome): Cough Diarrhea Fatigue Headache Nausea This list may not describe all possible side effects. Call your doctor for medical advice about side effects. You may report side effects to FDA at 1-800-FDA-1088. Where should I keep my medication? This medication is given in a hospital or clinic. It will not be stored at home. NOTE: This sheet is a summary. It may not cover all possible information. If you have questions about this medicine, talk to your doctor, pharmacist, or health care provider.  2024 Elsevier/Gold Standard (2021-05-18 00:00:00)

## 2023-08-23 ENCOUNTER — Ambulatory Visit: Payer: Self-pay | Admitting: Nurse Practitioner

## 2023-08-23 NOTE — Assessment & Plan Note (Signed)
 Hemoglobin A1c is stable.  Continue current medications.  Tolerating well. Plans to assess long-term glucose control with A1c. - Order A1c test.

## 2023-08-23 NOTE — Assessment & Plan Note (Signed)
 Due for cholesterol check. - Order cholesterol test.

## 2023-08-23 NOTE — Assessment & Plan Note (Signed)
 Continue f/u with Urology

## 2023-08-23 NOTE — Assessment & Plan Note (Signed)
 Continue statin

## 2023-08-23 NOTE — Assessment & Plan Note (Signed)
 Blood pressure improved to 120/78 mmHg. Discussed dehydration risks and its impact on hypertension. - Advise hydration, especially in hot weather. - Educate on dehydration signs and effects on blood pressure.

## 2023-08-29 ENCOUNTER — Encounter: Payer: Medicare PPO | Admitting: Nurse Practitioner

## 2023-08-29 ENCOUNTER — Ambulatory Visit: Payer: Medicare PPO

## 2023-08-29 ENCOUNTER — Ambulatory Visit (INDEPENDENT_AMBULATORY_CARE_PROVIDER_SITE_OTHER)

## 2023-08-29 VITALS — BP 110/64 | HR 44 | Temp 97.5°F | Ht 67.0 in | Wt 169.4 lb

## 2023-08-29 DIAGNOSIS — Z0000 Encounter for general adult medical examination without abnormal findings: Secondary | ICD-10-CM

## 2023-08-29 DIAGNOSIS — Z1211 Encounter for screening for malignant neoplasm of colon: Secondary | ICD-10-CM

## 2023-08-29 NOTE — Progress Notes (Cosign Needed Addendum)
 Subjective:   Kyle George is a 73 y.o. who presents for a Medicare Wellness preventive visit.  As a reminder, Annual Wellness Visits don't include a physical exam, and some assessments may be limited, especially if this visit is performed virtually. We may recommend an in-person follow-up visit with your provider if needed.  Visit Complete: In person    Persons Participating in Visit: Patient.  AWV Questionnaire: No: Patient Medicare AWV questionnaire was not completed prior to this visit.  Cardiac Risk Factors include: advanced age (>80men, >28 women);diabetes mellitus;dyslipidemia;hypertension;male gender     Objective:    Today's Vitals   08/29/23 1235  BP: 110/64  Pulse: (!) 46  Temp: (!) 97.5 F (36.4 C)  TempSrc: Oral  SpO2: 96%  Weight: 169 lb 6.4 oz (76.8 kg)  Height: 5' 7 (1.702 m)   Body mass index is 26.53 kg/m.     08/29/2023   12:45 PM 05/28/2023    3:07 PM 05/28/2023    1:40 PM 01/30/2023    1:59 PM 01/02/2023   11:04 AM 11/07/2022    3:04 PM 10/12/2022   10:54 AM  Advanced Directives  Does Patient Have a Medical Advance Directive? No No No No No No No  Would patient like information on creating a medical advance directive? No - Patient declined No - Patient declined No - Patient declined No - Patient declined No - Patient declined No - Patient declined No - Patient declined    Current Medications (verified) Outpatient Encounter Medications as of 08/29/2023  Medication Sig   aspirin 81 MG tablet Take 81 mg by mouth daily.   Calcium  Carbonate-Vitamin D  (CALCIUM  600+D PO) Take 1 tablet by mouth daily. Taking calcium  + D3   carvedilol  (COREG ) 6.25 MG tablet TAKE 1 TABLET BY MOUTH TWICE A DAY WITH FOOD   cholecalciferol (VITAMIN D ) 1000 UNITS tablet Take 5,000 Units by mouth daily.   enzalutamide  (XTANDI ) 40 MG tablet TAKE 4 TABLETS (160 MG TOTAL) BY MOUTH DAILY.   Multiple Vitamin (MULTIVITAMIN WITH MINERALS) TABS Take 1 tablet by mouth daily.    Olmesartan -amLODIPine -HCTZ 40-10-25 MG TABS TAKE 1 TABLET BY MOUTH EVERY DAY   Omega-3 Fatty Acids (FISH OIL PO) Take 1 tablet by mouth once.   pioglitazone -metformin  (ACTOPLUS MET ) 15-850 MG tablet Take 1 tablet by mouth 2 (two) times daily.   rosuvastatin  (CRESTOR ) 10 MG tablet TAKE 1 TABLET BY MOUTH EVERY DAY   Semaglutide ,0.25 or 0.5MG /DOS, (OZEMPIC , 0.25 OR 0.5 MG/DOSE,) 2 MG/3ML SOPN INJECT 0.25 MG WEEKLY FOR 4 WEEKS, THEN INCREASE TO 0.5 MG WEEKLY   spironolactone  (ALDACTONE ) 25 MG tablet TAKE 1 TABLET (25 MG TOTAL) BY MOUTH DAILY.   blood glucose meter kit and supplies KIT Dispense based on patient and insurance preference. Use up to four times daily as directed. (Patient not taking: Reported on 08/29/2023)   Blood Pressure Monitoring (BLOOD PRESSURE CUFF) MISC by Does not apply route. Check blood pressure at least 4 times per week 3 hours after taking medication (Patient not taking: Reported on 08/29/2023)   glucose blood test strip 1 each by Other route as needed for other. Use as instructed to check blood sugars (Patient not taking: Reported on 08/29/2023)   Lancets (ONETOUCH DELICA PLUS LANCET33G) MISC by Does not apply route. Use as directed to check blood  Sugars 2 times per day dx E11.65 (Patient not taking: Reported on 08/29/2023)   NON FORMULARY Take 1 tablet by mouth daily. Thyroid   action (Patient not taking:  Reported on 08/29/2023)   No facility-administered encounter medications on file as of 08/29/2023.    Allergies (verified) Patient has no known allergies.   History: Past Medical History:  Diagnosis Date   Cancer of prostate (HCC)    Diabetes mellitus without complication (HCC)    Hyperlipidemia    Hypertension    Past Surgical History:  Procedure Laterality Date   COLONOSCOPY     HEMORRHOID SURGERY     Family History  Problem Relation Age of Onset   Hypertension Mother    Diabetes Mother    Cancer Father        He is unsure of what type of cancer but it was not  prostate cancer   Cancer Paternal Uncle        Pt is unsure of what type of cancer   Colon cancer Neg Hx    Esophageal cancer Neg Hx    Stomach cancer Neg Hx    Rectal cancer Neg Hx    Social History   Socioeconomic History   Marital status: Married    Spouse name: Not on file   Number of children: Not on file   Years of education: Not on file   Highest education level: Not on file  Occupational History   Not on file  Tobacco Use   Smoking status: Some Days    Current packs/day: 0.25    Average packs/day: 0.3 packs/day for 50.0 years (12.5 ttl pk-yrs)    Types: Cigarettes   Smokeless tobacco: Former    Types: Chew    Quit date: 1975   Tobacco comments:    smoked about 1/2 PPD for about 10 years; now at 4-5 cigarettes per day   Vaping Use   Vaping status: Never Used  Substance and Sexual Activity   Alcohol use: Yes    Comment: occassionally   Drug use: Never   Sexual activity: Yes  Other Topics Concern   Not on file  Social History Narrative   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   Social Drivers of Corporate investment banker Strain: Low Risk  (08/29/2023)   Overall Financial Resource Strain (CARDIA)    Difficulty of Paying Living Expenses: Not hard at all  Food Insecurity: No Food Insecurity (08/29/2023)   Hunger Vital Sign    Worried About Running Out of Food in the Last Year: Never true    Ran Out of Food in the Last Year: Never true  Transportation Needs: No Transportation Needs (08/29/2023)   PRAPARE - Administrator, Civil Service (Medical): No    Lack of Transportation (Non-Medical): No  Physical Activity: Sufficiently Active (08/29/2023)   Exercise Vital Sign    Days of Exercise per Week: 7 days    Minutes of Exercise per Session: 90 min  Stress: No Stress Concern Present (08/29/2023)   [employer] of Occupational Health - Occupational Stress Questionnaire    Feeling of Stress: Not at all  Social Connections:  Moderately Integrated (08/29/2023)   Social Connection and Isolation Panel    Frequency of Communication with Friends and Family: More than three times a week    Frequency of Social Gatherings with Friends and Family: Once a week    Attends Religious Services: 1 to 4 times per year    Active Member of [employer] or Organizations: No    Attends Banker Meetings: Never    Marital Status: Married    Tobacco Counseling  Ready to quit: Not Answered Counseling given: Not Answered Tobacco comments: smoked about 1/2 PPD for about 10 years; now at 4-5 cigarettes per day     Clinical Intake:  Pre-visit preparation completed: Yes  Pain : No/denies pain     Nutritional Status: BMI 25 -29 Overweight Nutritional Risks: None Diabetes: Yes CBG done?: No Did pt. bring in CBG monitor from home?: No  Lab Results  Component Value Date   HGBA1C 5.3 08/06/2023   HGBA1C 5.5 04/05/2023   HGBA1C 5.7 (H) 08/23/2022     How often do you need to have someone help you when you read instructions, pamphlets, or other written materials from your doctor or pharmacy?: 1 - Never  Interpreter Needed?: No  Information entered by :: NAllen LPN   Activities of Daily Living     08/29/2023   12:37 PM  In your present state of health, do you have any difficulty performing the following activities:  Hearing? 0  Vision? 0  Difficulty concentrating or making decisions? 0  Walking or climbing stairs? 0  Dressing or bathing? 0  Doing errands, shopping? 0  Preparing Food and eating ? N  Using the Toilet? N  In the past six months, have you accidently leaked urine? N  Do you have problems with loss of bowel control? N  Managing your Medications? N  Managing your Finances? N  Housekeeping or managing your Housekeeping? N    Patient Care Team: [name], [name] as PCP - General (General Practice)  I have updated your Care Teams any recent Medical Services you may have received from other  providers in the past year.     Assessment:   This is a routine wellness examination for Reda.  Hearing/Vision screen Hearing Screening - Comments:: Denies hearing issues Vision Screening - Comments:: Regular eye exams,    Goals Addressed             This Visit's Progress    Patient Stated       08/29/2023, quit smoking completely       Depression Screen     08/29/2023   12:46 PM 08/06/2023   12:02 PM 07/23/2023    1:13 PM 04/05/2023   11:43 AM 08/23/2022   11:26 AM 04/03/2022    2:08 PM 02/07/2022    1:37 PM  PHQ 2/9 Scores  PHQ - 2 Score 0 0 0 0 0 0 0  PHQ- 9 Score 3 0  0 1      Fall Risk     08/29/2023   12:45 PM 08/06/2023   12:02 PM 08/23/2022   11:25 AM 04/03/2022    2:08 PM 09/08/2021    9:47 AM  Fall Risk   Falls in the past year? 0 0 0 1   Number falls in past yr: 0 0 0 0 0  Injury with Fall? 0 0 0 0 0  Risk for fall due to : Medication side effect No Fall Risks Medication side effect  No Fall Risks  Follow up Falls evaluation completed;Falls prevention discussed Falls evaluation completed Falls prevention discussed;Falls evaluation completed  Falls evaluation completed      Data saved with a previous flowsheet row definition    MEDICARE RISK AT HOME:  Medicare Risk at Home Any stairs in or around the home?: Yes If so, are there any without handrails?: No Home free of loose throw rugs in walkways, pet beds, electrical cords, etc?: Yes Adequate lighting in your home to reduce  risk of falls?: Yes Life alert?: No Use of a cane, walker or w/c?: No Grab bars in the bathroom?: No Shower chair or bench in shower?: No Elevated toilet seat or a handicapped toilet?: No  TIMED UP AND GO:  Was the test performed?  Yes  Length of time to ambulate 10 feet: 5 sec Gait steady and fast without use of assistive device  Cognitive Function: 6CIT completed        08/29/2023   12:47 PM 08/23/2022   11:26 AM 08/10/2021   12:26 PM 05/27/2020    4:22 PM 03/20/2019     3:33 PM  6CIT Screen  What Year? 0 points 0 points 0 points 0 points 0 points  What month? 0 points 0 points 0 points 0 points 0 points  What time? 0 points 0 points 0 points 0 points 0 points  Count back from 20 0 points 0 points 0 points 0 points 0 points  Months in reverse 0 points 0 points 0 points 0 points 0 points  Repeat phrase 0 points 0 points 0 points 0 points 0 points  Total Score 0 points 0 points 0 points 0 points 0 points    Immunizations Immunization History  Administered Date(s) Administered   Fluad Quad(high Dose 65+) 11/25/2019, 12/15/2020   Influenza, High Dose Seasonal PF 12/03/2018   Influenza-Unspecified 01/10/2021   PFIZER(Purple Top)SARS-COV-2 Vaccination 04/11/2019, 05/09/2019, 01/01/2020   PNEUMOCOCCAL CONJUGATE-20 04/03/2022   Tdap 04/25/2013    Screening Tests Health Maintenance  Topic Date Due   Colonoscopy  05/23/2022   FOOT EXAM  09/09/2022   INFLUENZA VACCINE  09/17/2023 (Originally 08/10/2023)   Zoster Vaccines- Shingrix  (1 of 2) 11/06/2023 (Originally 03/08/1969)   DTaP/Tdap/Td (2 - Td or Tdap) 08/05/2024 (Originally 04/26/2023)   OPHTHALMOLOGY EXAM  09/21/2023   HEMOGLOBIN A1C  02/06/2024   Diabetic kidney evaluation - Urine ACR  04/04/2024   Diabetic kidney evaluation - eGFR measurement  08/19/2024   Medicare Annual Wellness (AWV)  08/28/2024   Pneumococcal Vaccine: 50+ Years  Completed   Hepatitis C Screening  Completed   HPV VACCINES  Aged Out   Meningococcal B Vaccine  Aged Out   COVID-19 Vaccine  Discontinued    Health Maintenance  Health Maintenance Due  Topic Date Due   Colonoscopy  05/23/2022   FOOT EXAM  09/09/2022   Health Maintenance Items Addressed: Referral sent to GI for colonoscopy, Diabetic Foot Exam recommended  Additional Screening:  Vision Screening: Recommended annual ophthalmology exams for early detection of glaucoma and other disorders of the eye. Would you like a referral to an eye doctor? No    Dental  Screening: Recommended annual dental exams for proper oral hygiene  Community Resource Referral / Chronic Care Management: CRR required this visit?  No   CCM required this visit?  No   Plan:    I have personally reviewed and noted the following in the patient's chart:   Medical and social history Use of alcohol, tobacco or illicit drugs  Current medications and supplements including opioid prescriptions. Patient is not currently taking opioid prescriptions. Functional ability and status Nutritional status Physical activity Advanced directives List of other physicians Hospitalizations, surgeries, and ER visits in previous 12 months Vitals Screenings to include cognitive, depression, and falls Referrals and appointments  In addition, I have reviewed and discussed with patient certain preventive protocols, quality metrics, and best practice recommendations. A written personalized care plan for preventive services as well as general preventive health recommendations  were provided to patient.   Ardella FORBES Dawn, LPN   [id number]   After Visit Summary: (In Person-Printed) AVS printed and given to the patient  Notes: Heart rate was 35 on pulse ox. Rechecked manually and it was 46. Informed PCP and rechecked after visit. Hear rate was 44. PCP says she is putting in a referral for a cardiologist. Patient states that he feels fine.

## 2023-08-29 NOTE — Patient Instructions (Signed)
 Kyle George , Thank you for taking time out of your busy schedule to complete your Annual Wellness Visit with me. I enjoyed our conversation and look forward to speaking with you again next year. I, as well as your care team,  appreciate your ongoing commitment to your health goals. Please review the following plan we discussed and let me know if I can assist you in the future. Your Game plan/ To Do List    Referrals: If you haven't heard from the office you've been referred to, please reach out to them at the phone provided.   Follow up Visits: We will see or speak with you next year for your Next Medicare AWV with our clinical staff Have you seen your provider in the last 6 months (3 months if uncontrolled diabetes)? Yes  Clinician Recommendations:  Aim for 30 minutes of exercise or brisk walking, 6-8 glasses of water, and 5 servings of fruits and vegetables each day.       This is a list of the screenings recommended for you:  Health Maintenance  Topic Date Due   Colon Cancer Screening  05/23/2022   Complete foot exam   09/09/2022   Flu Shot  09/17/2023*   Zoster (Shingles) Vaccine (1 of 2) 11/06/2023*   DTaP/Tdap/Td vaccine (2 - Td or Tdap) 08/05/2024*   Eye exam for diabetics  09/21/2023   Hemoglobin A1C  02/06/2024   Yearly kidney health urinalysis for diabetes  04/04/2024   Yearly kidney function blood test for diabetes  08/19/2024   Medicare Annual Wellness Visit  08/28/2024   Pneumococcal Vaccine for age over 2  Completed   Hepatitis C Screening  Completed   HPV Vaccine  Aged Out   Meningitis B Vaccine  Aged Out   COVID-19 Vaccine  Discontinued  *Topic was postponed. The date shown is not the original due date.    Advanced directives: (Declined) Advance directive discussed with you today. Even though you declined this today, please call our office should you change your mind, and we can give you the proper paperwork for you to fill out. Advance Care Planning is important  because it:  [x]  Makes sure you receive the medical care that is consistent with your values, goals, and preferences  [x]  It provides guidance to your family and loved ones and reduces their decisional burden about whether or not they are making the right decisions based on your wishes.  Follow the link provided in your after visit summary or read over the paperwork we have mailed to you to help you started getting your Advance Directives in place. If you need assistance in completing these, please reach out to us  so that we can help you!  See attachments for Preventive Care and Fall Prevention Tips.

## 2023-09-06 ENCOUNTER — Other Ambulatory Visit (HOSPITAL_COMMUNITY): Payer: Self-pay

## 2023-09-15 NOTE — Progress Notes (Unsigned)
 SENT TO EMERGENCY DEPARTMENT AFTER VISIT: Incidentally found to have significant bradycardia (manually checked heart rate 34). He is asymptomatic, but was seen by PCP on 08/29/2023 - Coreg  was discontinued at that time, but he remains bradycardic at today's visit. I reached out to PCP, and she is requesting ED evaluation. He has not yet been seen by a cardiologist.  Report given to ED physician Dr. Lamar Salen, and patient transported to ED by [facility] nursing staff.   [facility] [address], [place] [address]   CLINIC:  Medical Oncology/Hematology  PCP:  [name], [name] 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 ADT (Firmagon, Lupron ) 11/2011 - 12/2013 2. Abiraterone  08/2016 - 01/2020  CURRENT THERAPY: - Xtandi  160 mg daily - Eligard  every 4 months - Xgeva  monthly  BRIEF ONCOLOGIC HISTORY:   Oncology History  Secondary malignant neoplasm of bone (HCC)  11/25/2019 Initial Diagnosis   Secondary malignant neoplasm of bone (HCC)   04/29/2020 Cancer Staging   Staging form: Bone - Appendicular Skeleton, Trunk, Skull, and Facial Bones, AJCC 8th Edition - Clinical: Stage IVB (cTX, cNX, pM1b) - Signed by Amadeo Windell SAILOR, MD on 04/29/2020     CANCER STAGING:  Cancer Staging  Secondary malignant neoplasm of bone (HCC) Staging form: Bone - Appendicular Skeleton, Trunk, Skull, and Facial Bones, AJCC 8th Edition - Clinical: Stage IVB (cTX, cNX, pM1b) - Signed by Amadeo Windell SAILOR, MD on 04/29/2020   INTERVAL HISTORY:   Kyle George, a 73 y.o. male, returns for routine follow-up of his metastatic castrate resistant prostate cancer to lymph nodes and bones. Flavio was last seen on 05/28/2023 by Dr. Rogers.   At today's visit, he  reports feeling great.  He denies any recent hospitalizations,  surgeries, or changes in his  baseline health status. He reports 75% energy and 75% appetite. He continues to lose weight, as he is on Ozempic .  He denies bone pain. New masses or lymphadenopathy No nausea/vomiting. No spinal cord symptoms (pain, radiculopathy, focal neurologic deficits, bowel/bladder dysfunction).  He continues to receive the following: Xgeva  (denosumab ) monthly: He denies any new onset bone pain, fractures, or jaw pain.  Calcium  is normal.   Xtandi  (enzalutamide ) 160 mg daily: He denies any recent falls or seizures.  Chronic peripheral edema for the last 10 years is stable.  No abnormal fatigue, musculoskeletal pain, hot flashes, nausea, constipation, diarrhea, headache, dizziness, hypertension.  Eligard  (leuprolide  acetate) every 4 months: Denies any abnormal fatigue, hot flashes, or injection site reactions.  Patient noted to have significant bradycardia during visit today.  He is asymptomatic.  ASSESSMENT & PLAN:  ## Bradycardia - Manually checked heart rate 34. - Asymptomatic.  No chest pain, shortness of breath, lightheadedness, or mental status changes. - Evaluated by PCP for bradycardia on 08/29/2023.  Coreg  was discontinued at that time. - Patient has remained off of Coreg  for the past 2 weeks, remains bradycardic today. - PLAN: Discussed with PCP, who requests ED evaluation.  Report given to ED physician Dr. Lamar Salen, and patient transported to ED by [facility] nursing staff.  1.  Metastatic castrate resistant prostate cancer to the lymph nodes and bones: - Right iliac lymph node biopsy (11/16/2011): Metastatic prostate adenocarcinoma Gleason 4+4= 8 - ADT with Firmagon, switched to Lupron  with PSA response in December  2015 - He failed to follow-up with PSA rising up to 1400 in April 2018 - Abiraterone  and prednisone  started in August 2018 - Bone scan (03/24/2020): Increased activity in the bony pelvis especially in the right ischium and adjacent  sacroiliac joints. - CT CAP (03/24/2020): Mildly improved retroperitoneal and left pelvic sidewall adenopathy but with new and substantially increased sclerosis metastatic lesions.  Upper normal partially calcified subcarinal lymph nodes. - Xtandi  (enzalutamide ) 160 mg daily started on 03/01/2020. - Receiving Eligard  (leuprolide  acetate) every 4 months. - He is tolerating enzalutamide  reasonably well.  He denies any falls or seizure activity.  He continues to lose weight as he is on Ozempic . - Labs today (09/17/2023): Normal LFTs and creatinine.  CBC was grossly normal.  PSA today is undetectable. - PLAN: Continue Xtandi  160 mg daily.  (Patient aware that main complications of Xtandi  include nausea, fatigue, hypertension.) - Continue Eligard  30 mg every 4 months.  (Patient aware that complications of Eligard  could include weight gain or hot flashes.) **Eligard  currently out of stock in clinic, but we will have tomorrow.  Injection appointment rescheduled to tomorrow. - RTC in 4 months for follow-up with repeat labs (CBC/D, CMP, PSA).    2.  Metastatic bone disease: - Denosumab  started on 10/12/2022. - He does not report any adverse effects.  No jaw pain reported.   - Calcium  today is normal (8.9). - PLAN: Proceed with denosumab  tomorrow.  Continue monthly denosumab  with CMP.    3.  Macrocytosis - Intermittent macrocytosis since 2018 - PLAN: Check B12, MMA, folate with next labs.  4.  Social/family history: - Lives at home with his wife.  Independent of ADLs and IADLs.  Does gardening.  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 - Current active smoker, 3 to 4 cigarettes/day.  Smoked 1 pack/day for more than 30 years. - Father and paternal uncle had cancers, type not known to the patient.  PLAN SUMMARY: **Alternate MD/APP visits >> Reschedule injection (Eligard  and Xgeva ) to tomorrow, pending ED discharge >> CMP + Xgeva  injection monthly >> Same-day labs (CBC/D, CMP, PSA, B12, MMA, folate)  + MD OFFICE visit + Eligard /Xgeva  INJECTION in 4 months    REVIEW OF SYSTEMS:   Review of Systems  Constitutional:  Negative for appetite change, chills, diaphoresis, fatigue, fever and unexpected weight change.  HENT:   Negative for lump/mass and nosebleeds.   Eyes:  Negative for eye problems.  Respiratory:  Negative for cough, hemoptysis and shortness of breath.   Cardiovascular:  Negative for chest pain, leg swelling and palpitations.  Gastrointestinal:  Negative for abdominal pain, blood in stool, constipation, diarrhea, nausea and vomiting.  Genitourinary:  Negative for hematuria.   Skin: Negative.   Neurological:  Negative for dizziness, headaches and light-headedness.  Hematological:  Does not bruise/bleed easily.    PHYSICAL EXAM:   Performance status (ECOG): 0 - Asymptomatic  Vitals:   09/17/23 1409  BP: (!) 184/64  Pulse: (!) 35  Resp: 16  Temp: 97.9 F (36.6 C)  SpO2: 100%   Wt Readings from Last 3 Encounters:  09/17/23 165 lb 5.5 oz (75 kg)  09/17/23 165 lb 12.6 oz (75.2 kg)  08/29/23 169 lb 6.4 oz (76.8 kg)   Physical Exam Constitutional:      Appearance: Normal appearance. He is normal weight.  Cardiovascular:     Rate and Rhythm: Bradycardia present.     Heart sounds: Normal heart sounds.  Pulmonary:     Breath sounds: Normal breath  sounds.  Neurological:     General: No focal deficit present.     Mental Status: Mental status is at baseline.  Psychiatric:        Behavior: Behavior normal. Behavior is cooperative.     PAST MEDICAL/SURGICAL HISTORY:  Past Medical History:  Diagnosis Date   Cancer of prostate (HCC)    Diabetes mellitus without complication (HCC)    Hyperlipidemia    Hypertension    Past Surgical History:  Procedure Laterality Date   COLONOSCOPY     HEMORRHOID SURGERY      SOCIAL HISTORY:  Social History   Socioeconomic History   Marital status: Married    Spouse name: Not on file   Number of children: Not on file    Years of education: Not on file   Highest education level: Not on file  Occupational History   Not on file  Tobacco Use   Smoking status: Some Days    Current packs/day: 0.25    Average packs/day: 0.3 packs/day for 50.0 years (12.5 ttl pk-yrs)    Types: Cigarettes   Smokeless tobacco: Former    Types: Chew    Quit date: 1975   Tobacco comments:    smoked about 1/2 PPD for about 10 years; now at 4-5 cigarettes per day   Vaping Use   Vaping status: Never Used  Substance and Sexual Activity   Alcohol use: Yes    Comment: occassionally   Drug use: Never   Sexual activity: Yes  Other Topics Concern   Not on file  Social History Narrative   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   Social Drivers of Corporate investment banker Strain: Low Risk  (08/29/2023)   Overall Financial Resource Strain (CARDIA)    Difficulty of Paying Living Expenses: Not hard at all  Food Insecurity: No Food Insecurity (08/29/2023)   Hunger Vital Sign    Worried About Running Out of Food in the Last Year: Never true    Ran Out of Food in the Last Year: Never true  Transportation Needs: No Transportation Needs (08/29/2023)   PRAPARE - Administrator, Civil Service (Medical): No    Lack of Transportation (Non-Medical): No  Physical Activity: Sufficiently Active (08/29/2023)   Exercise Vital Sign    Days of Exercise per Week: 7 days    Minutes of Exercise per Session: 90 min  Stress: No Stress Concern Present (08/29/2023)   [employer] of Occupational Health - Occupational Stress Questionnaire    Feeling of Stress: Not at all  Social Connections: Moderately Integrated (08/29/2023)   Social Connection and Isolation Panel    Frequency of Communication with Friends and Family: More than three times a week    Frequency of Social Gatherings with Friends and Family: Once a week    Attends Religious Services: 1 to 4 times per year    Active Member of [employer] or  Organizations: No    Attends Banker Meetings: Never    Marital Status: Married  Catering manager Violence: Not At Risk (08/29/2023)   Humiliation, Afraid, Rape, and Kick questionnaire    Fear of Current or Ex-Partner: No    Emotionally Abused: No    Physically Abused: No    Sexually Abused: No    FAMILY HISTORY:  Family History  Problem Relation Age of Onset   Hypertension Mother    Diabetes Mother    Cancer Father  He is unsure of what type of cancer but it was not prostate cancer   Cancer Paternal Uncle        Pt is unsure of what type of cancer   Colon cancer Neg Hx    Esophageal cancer Neg Hx    Stomach cancer Neg Hx    Rectal cancer Neg Hx     CURRENT MEDICATIONS:  No current facility-administered medications for this visit.   Current Outpatient Medications  Medication Sig Dispense Refill   aspirin 81 MG tablet Take 81 mg by mouth daily.     blood glucose meter kit and supplies KIT Dispense based on patient and insurance preference. Use up to four times daily as directed. 1 each 0   Blood Pressure Monitoring (BLOOD PRESSURE CUFF) MISC by Does not apply route. Check blood pressure at least 4 times per week 3 hours after taking medication     Calcium  Carbonate-Vitamin D  (CALCIUM  600+D PO) Take 1 tablet by mouth daily. Taking calcium  + D3     cholecalciferol (VITAMIN D ) 1000 UNITS tablet Take 5,000 Units by mouth daily.     enzalutamide  (XTANDI ) 40 MG tablet TAKE 4 TABLETS (160 MG TOTAL) BY MOUTH DAILY. 120 tablet 2   glucose blood test strip 1 each by Other route as needed for other. Use as instructed to check blood sugars     Lancets (ONETOUCH DELICA PLUS LANCET33G) MISC by Does not apply route. Use as directed to check blood  Sugars 2 times per day dx E11.65     Multiple Vitamin (MULTIVITAMIN WITH MINERALS) TABS Take 1 tablet by mouth daily.     NON FORMULARY Take 1 tablet by mouth daily. Thyroid   action     Olmesartan -amLODIPine -HCTZ 40-10-25 MG TABS  TAKE 1 TABLET BY MOUTH EVERY DAY 90 tablet 1   Omega-3 Fatty Acids (FISH OIL PO) Take 1 tablet by mouth once.     pioglitazone -metformin  (ACTOPLUS MET ) 15-850 MG tablet Take 1 tablet by mouth 2 (two) times daily. 180 tablet 1   rosuvastatin  (CRESTOR ) 10 MG tablet TAKE 1 TABLET BY MOUTH EVERY DAY 90 tablet 1   Semaglutide ,0.25 or 0.5MG /DOS, (OZEMPIC , 0.25 OR 0.5 MG/DOSE,) 2 MG/3ML SOPN INJECT 0.25 MG WEEKLY FOR 4 WEEKS, THEN INCREASE TO 0.5 MG WEEKLY 3 mL 2   spironolactone  (ALDACTONE ) 25 MG tablet TAKE 1 TABLET (25 MG TOTAL) BY MOUTH DAILY. 90 tablet 1   carvedilol  (COREG ) 6.25 MG tablet TAKE 1 TABLET BY MOUTH TWICE A DAY WITH FOOD (Patient not taking: Reported on 09/17/2023) 180 tablet 1   Facility-Administered Medications Ordered in Other Visits  Medication Dose Route Frequency Provider Last Rate Last Admin   sodium chloride  0.9 % bolus 500 mL  500 mL Intravenous Once Garrick Charleston, MD        ALLERGIES:  No Known Allergies  LABORATORY DATA:  I have reviewed the labs as listed.     Latest Ref Rng & Units 09/17/2023    1:27 PM 05/28/2023   12:55 PM 04/05/2023   12:41 PM  CBC  WBC 4.0 - 10.5 K/uL 6.9  6.5  7.4   Hemoglobin 13.0 - 17.0 g/dL 85.8  85.6  85.6   Hematocrit 39.0 - 52.0 % 42.4  42.4  41.9   Platelets 150 - 400 K/uL 202  187  CANCELED       Latest Ref Rng & Units 09/17/2023    1:27 PM 08/20/2023   12:36 PM 07/23/2023   12:21 PM  CMP  Glucose 70 - 99 mg/dL 70  84  84   BUN 8 - 23 mg/dL 19  22  25    Creatinine 0.61 - 1.24 mg/dL 8.97  9.03  8.87   Sodium 135 - 145 mmol/L 141  137  138   Potassium 3.5 - 5.1 mmol/L 3.9  3.9  3.8   Chloride 98 - 111 mmol/L 104  104  104   CO2 22 - 32 mmol/L 23  23  26    Calcium  8.9 - 10.3 mg/dL 8.9  8.8  8.9   Total Protein 6.5 - 8.1 g/dL 7.2  7.0  7.3   Total Bilirubin 0.0 - 1.2 mg/dL 0.6  0.7  0.7   Alkaline Phos 38 - 126 U/L 48  42  42   AST 15 - 41 U/L 17  16  15    ALT 0 - 44 U/L 10  8  10      DIAGNOSTIC IMAGING:  I have  independently reviewed the scans and discussed with the patient. No results found.   WRAP UP:  All questions were answered. The patient knows to call the clinic with any problems, questions or concerns.  Medical decision making: Moderate  Time spent on visit: I spent 20 minutes counseling the patient face to face. The total time spent in the appointment was 30 minutes and more than 50% was on counseling.  Pleasant CHRISTELLA Barefoot, PA-C  09/17/23 4:26 PM

## 2023-09-17 ENCOUNTER — Other Ambulatory Visit: Payer: Self-pay

## 2023-09-17 ENCOUNTER — Other Ambulatory Visit: Payer: Self-pay | Admitting: Pharmacy Technician

## 2023-09-17 ENCOUNTER — Inpatient Hospital Stay (HOSPITAL_COMMUNITY)

## 2023-09-17 ENCOUNTER — Emergency Department (HOSPITAL_COMMUNITY)

## 2023-09-17 ENCOUNTER — Other Ambulatory Visit: Payer: Self-pay | Admitting: Physician Assistant

## 2023-09-17 ENCOUNTER — Inpatient Hospital Stay: Attending: Nurse Practitioner | Admitting: Physician Assistant

## 2023-09-17 ENCOUNTER — Encounter (HOSPITAL_COMMUNITY): Payer: Self-pay | Admitting: Emergency Medicine

## 2023-09-17 ENCOUNTER — Inpatient Hospital Stay

## 2023-09-17 ENCOUNTER — Inpatient Hospital Stay (HOSPITAL_COMMUNITY)
Admission: EM | Admit: 2023-09-17 | Discharge: 2023-09-22 | DRG: 260 | Disposition: A | Discharge status: Home / Self Care | Source: Ambulatory Visit | Attending: Internal Medicine | Admitting: Internal Medicine

## 2023-09-17 VITALS — BP 172/63 | HR 35 | Temp 97.9°F | Resp 16 | Wt 165.8 lb

## 2023-09-17 DIAGNOSIS — C61 Malignant neoplasm of prostate: Secondary | ICD-10-CM | POA: Insufficient documentation

## 2023-09-17 DIAGNOSIS — I160 Hypertensive urgency: Secondary | ICD-10-CM | POA: Diagnosis not present

## 2023-09-17 DIAGNOSIS — E782 Mixed hyperlipidemia: Secondary | ICD-10-CM | POA: Diagnosis not present

## 2023-09-17 DIAGNOSIS — C779 Secondary and unspecified malignant neoplasm of lymph node, unspecified: Secondary | ICD-10-CM | POA: Insufficient documentation

## 2023-09-17 DIAGNOSIS — I4901 Ventricular fibrillation: Secondary | ICD-10-CM | POA: Diagnosis present

## 2023-09-17 DIAGNOSIS — Z809 Family history of malignant neoplasm, unspecified: Secondary | ICD-10-CM | POA: Insufficient documentation

## 2023-09-17 DIAGNOSIS — R918 Other nonspecific abnormal finding of lung field: Secondary | ICD-10-CM | POA: Diagnosis not present

## 2023-09-17 DIAGNOSIS — Z7401 Bed confinement status: Secondary | ICD-10-CM | POA: Diagnosis not present

## 2023-09-17 DIAGNOSIS — E872 Acidosis, unspecified: Secondary | ICD-10-CM | POA: Diagnosis present

## 2023-09-17 DIAGNOSIS — K7200 Acute and subacute hepatic failure without coma: Secondary | ICD-10-CM | POA: Diagnosis not present

## 2023-09-17 DIAGNOSIS — F17210 Nicotine dependence, cigarettes, uncomplicated: Secondary | ICD-10-CM | POA: Diagnosis present

## 2023-09-17 DIAGNOSIS — J439 Emphysema, unspecified: Secondary | ICD-10-CM | POA: Diagnosis not present

## 2023-09-17 DIAGNOSIS — N170 Acute kidney failure with tubular necrosis: Secondary | ICD-10-CM | POA: Diagnosis not present

## 2023-09-17 DIAGNOSIS — I469 Cardiac arrest, cause unspecified: Principal | ICD-10-CM | POA: Diagnosis present

## 2023-09-17 DIAGNOSIS — D696 Thrombocytopenia, unspecified: Secondary | ICD-10-CM | POA: Diagnosis present

## 2023-09-17 DIAGNOSIS — R042 Hemoptysis: Secondary | ICD-10-CM | POA: Diagnosis not present

## 2023-09-17 DIAGNOSIS — Z833 Family history of diabetes mellitus: Secondary | ICD-10-CM

## 2023-09-17 DIAGNOSIS — J690 Pneumonitis due to inhalation of food and vomit: Secondary | ICD-10-CM | POA: Diagnosis present

## 2023-09-17 DIAGNOSIS — Z7982 Long term (current) use of aspirin: Secondary | ICD-10-CM

## 2023-09-17 DIAGNOSIS — J441 Chronic obstructive pulmonary disease with (acute) exacerbation: Secondary | ICD-10-CM | POA: Diagnosis not present

## 2023-09-17 DIAGNOSIS — E876 Hypokalemia: Secondary | ICD-10-CM | POA: Diagnosis not present

## 2023-09-17 DIAGNOSIS — I472 Ventricular tachycardia, unspecified: Secondary | ICD-10-CM | POA: Diagnosis present

## 2023-09-17 DIAGNOSIS — R34 Anuria and oliguria: Secondary | ICD-10-CM | POA: Diagnosis not present

## 2023-09-17 DIAGNOSIS — Z192 Hormone resistant malignancy status: Secondary | ICD-10-CM | POA: Insufficient documentation

## 2023-09-17 DIAGNOSIS — Z87891 Personal history of nicotine dependence: Secondary | ICD-10-CM | POA: Insufficient documentation

## 2023-09-17 DIAGNOSIS — C7951 Secondary malignant neoplasm of bone: Secondary | ICD-10-CM

## 2023-09-17 DIAGNOSIS — E11649 Type 2 diabetes mellitus with hypoglycemia without coma: Secondary | ICD-10-CM | POA: Diagnosis present

## 2023-09-17 DIAGNOSIS — Z7985 Long-term (current) use of injectable non-insulin antidiabetic drugs: Secondary | ICD-10-CM

## 2023-09-17 DIAGNOSIS — E1165 Type 2 diabetes mellitus with hyperglycemia: Secondary | ICD-10-CM | POA: Diagnosis present

## 2023-09-17 DIAGNOSIS — R569 Unspecified convulsions: Secondary | ICD-10-CM | POA: Diagnosis not present

## 2023-09-17 DIAGNOSIS — Z6824 Body mass index (BMI) 24.0-24.9, adult: Secondary | ICD-10-CM

## 2023-09-17 DIAGNOSIS — J9601 Acute respiratory failure with hypoxia: Secondary | ICD-10-CM

## 2023-09-17 DIAGNOSIS — R570 Cardiogenic shock: Secondary | ICD-10-CM | POA: Diagnosis not present

## 2023-09-17 DIAGNOSIS — R5383 Other fatigue: Secondary | ICD-10-CM | POA: Insufficient documentation

## 2023-09-17 DIAGNOSIS — I959 Hypotension, unspecified: Secondary | ICD-10-CM | POA: Diagnosis not present

## 2023-09-17 DIAGNOSIS — N179 Acute kidney failure, unspecified: Secondary | ICD-10-CM | POA: Diagnosis not present

## 2023-09-17 DIAGNOSIS — D7589 Other specified diseases of blood and blood-forming organs: Secondary | ICD-10-CM | POA: Diagnosis not present

## 2023-09-17 DIAGNOSIS — I272 Pulmonary hypertension, unspecified: Secondary | ICD-10-CM | POA: Diagnosis not present

## 2023-09-17 DIAGNOSIS — Z79899 Other long term (current) drug therapy: Secondary | ICD-10-CM

## 2023-09-17 DIAGNOSIS — I10 Essential (primary) hypertension: Secondary | ICD-10-CM | POA: Insufficient documentation

## 2023-09-17 DIAGNOSIS — I071 Rheumatic tricuspid insufficiency: Secondary | ICD-10-CM | POA: Diagnosis present

## 2023-09-17 DIAGNOSIS — I452 Bifascicular block: Secondary | ICD-10-CM | POA: Diagnosis present

## 2023-09-17 DIAGNOSIS — R404 Transient alteration of awareness: Secondary | ICD-10-CM | POA: Diagnosis not present

## 2023-09-17 DIAGNOSIS — G9340 Encephalopathy, unspecified: Secondary | ICD-10-CM

## 2023-09-17 DIAGNOSIS — I493 Ventricular premature depolarization: Secondary | ICD-10-CM | POA: Diagnosis present

## 2023-09-17 DIAGNOSIS — R0789 Other chest pain: Secondary | ICD-10-CM | POA: Diagnosis not present

## 2023-09-17 DIAGNOSIS — D630 Anemia in neoplastic disease: Secondary | ICD-10-CM | POA: Diagnosis present

## 2023-09-17 DIAGNOSIS — R001 Bradycardia, unspecified: Secondary | ICD-10-CM | POA: Diagnosis present

## 2023-09-17 DIAGNOSIS — I442 Atrioventricular block, complete: Principal | ICD-10-CM | POA: Diagnosis present

## 2023-09-17 DIAGNOSIS — Z4682 Encounter for fitting and adjustment of non-vascular catheter: Secondary | ICD-10-CM | POA: Diagnosis not present

## 2023-09-17 DIAGNOSIS — Z8249 Family history of ischemic heart disease and other diseases of the circulatory system: Secondary | ICD-10-CM

## 2023-09-17 DIAGNOSIS — E119 Type 2 diabetes mellitus without complications: Secondary | ICD-10-CM

## 2023-09-17 DIAGNOSIS — I517 Cardiomegaly: Secondary | ICD-10-CM | POA: Diagnosis not present

## 2023-09-17 DIAGNOSIS — Z950 Presence of cardiac pacemaker: Secondary | ICD-10-CM | POA: Diagnosis not present

## 2023-09-17 DIAGNOSIS — R54 Age-related physical debility: Secondary | ICD-10-CM | POA: Diagnosis present

## 2023-09-17 DIAGNOSIS — E785 Hyperlipidemia, unspecified: Secondary | ICD-10-CM | POA: Insufficient documentation

## 2023-09-17 DIAGNOSIS — Z7984 Long term (current) use of oral hypoglycemic drugs: Secondary | ICD-10-CM | POA: Insufficient documentation

## 2023-09-17 DIAGNOSIS — I462 Cardiac arrest due to underlying cardiac condition: Secondary | ICD-10-CM | POA: Diagnosis not present

## 2023-09-17 DIAGNOSIS — R232 Flushing: Secondary | ICD-10-CM | POA: Insufficient documentation

## 2023-09-17 DIAGNOSIS — R9431 Abnormal electrocardiogram [ECG] [EKG]: Secondary | ICD-10-CM | POA: Diagnosis not present

## 2023-09-17 DIAGNOSIS — E8729 Other acidosis: Secondary | ICD-10-CM | POA: Diagnosis not present

## 2023-09-17 DIAGNOSIS — Z452 Encounter for adjustment and management of vascular access device: Secondary | ICD-10-CM | POA: Diagnosis not present

## 2023-09-17 DIAGNOSIS — J811 Chronic pulmonary edema: Secondary | ICD-10-CM | POA: Diagnosis not present

## 2023-09-17 DIAGNOSIS — G9349 Other encephalopathy: Secondary | ICD-10-CM | POA: Diagnosis present

## 2023-09-17 DIAGNOSIS — E0800 Diabetes mellitus due to underlying condition with hyperosmolarity without nonketotic hyperglycemic-hyperosmolar coma (NKHHC): Secondary | ICD-10-CM | POA: Diagnosis not present

## 2023-09-17 DIAGNOSIS — Z79818 Long term (current) use of other agents affecting estrogen receptors and estrogen levels: Secondary | ICD-10-CM | POA: Insufficient documentation

## 2023-09-17 DIAGNOSIS — I700 Atherosclerosis of aorta: Secondary | ICD-10-CM | POA: Diagnosis not present

## 2023-09-17 DIAGNOSIS — J9690 Respiratory failure, unspecified, unspecified whether with hypoxia or hypercapnia: Secondary | ICD-10-CM | POA: Diagnosis not present

## 2023-09-17 LAB — GLUCOSE, CAPILLARY
Glucose-Capillary: 259 mg/dL — ABNORMAL HIGH (ref 70–99)
Glucose-Capillary: 306 mg/dL — ABNORMAL HIGH (ref 70–99)

## 2023-09-17 LAB — CBC WITH DIFFERENTIAL/PLATELET
Abs Immature Granulocytes: 0.01 K/uL (ref 0.00–0.07)
Abs Immature Granulocytes: 0.02 K/uL (ref 0.00–0.07)
Basophils Absolute: 0 K/uL (ref 0.0–0.1)
Basophils Absolute: 0.1 K/uL (ref 0.0–0.1)
Basophils Relative: 1 %
Basophils Relative: 1 %
Eosinophils Absolute: 0.2 K/uL (ref 0.0–0.5)
Eosinophils Absolute: 0.2 K/uL (ref 0.0–0.5)
Eosinophils Relative: 3 %
Eosinophils Relative: 3 %
HCT: 42.4 % (ref 39.0–52.0)
HCT: 40.3 % (ref 39.0–52.0)
Hemoglobin: 14.1 g/dL (ref 13.0–17.0)
Hemoglobin: 13.4 g/dL (ref 13.0–17.0)
Immature Granulocytes: 0 %
Immature Granulocytes: 0 %
Lymphocytes Relative: 25 %
Lymphocytes Relative: 21 %
Lymphs Abs: 1.7 K/uL (ref 0.7–4.0)
Lymphs Abs: 1.4 K/uL (ref 0.7–4.0)
MCH: 35.6 pg — ABNORMAL HIGH (ref 26.0–34.0)
MCH: 35.4 pg — ABNORMAL HIGH (ref 26.0–34.0)
MCHC: 33.3 g/dL (ref 30.0–36.0)
MCHC: 33.3 g/dL (ref 30.0–36.0)
MCV: 107.1 fL — ABNORMAL HIGH (ref 80.0–100.0)
MCV: 106.6 fL — ABNORMAL HIGH (ref 80.0–100.0)
Monocytes Absolute: 0.3 K/uL (ref 0.1–1.0)
Monocytes Absolute: 0.4 K/uL (ref 0.1–1.0)
Monocytes Relative: 5 %
Monocytes Relative: 6 %
Neutro Abs: 4.6 K/uL (ref 1.7–7.7)
Neutro Abs: 4.6 K/uL (ref 1.7–7.7)
Neutrophils Relative %: 66 %
Neutrophils Relative %: 69 %
Platelets: 202 K/uL (ref 150–400)
Platelets: 188 K/uL (ref 150–400)
Platelets: 186 K/uL (ref 150–400)
RBC: 3.96 MIL/uL — ABNORMAL LOW (ref 4.22–5.81)
RBC: 3.78 MIL/uL — ABNORMAL LOW (ref 4.22–5.81)
RDW: 15.2 % (ref 11.5–15.5)
RDW: 15.2 % (ref 11.5–15.5)
Smear Review: NORMAL
Smear Review: NORMAL
WBC: 6.9 K/uL (ref 4.0–10.5)
WBC: 6.7 K/uL (ref 4.0–10.5)
nRBC: 0 % (ref 0.0–0.2)
nRBC: 0 % (ref 0.0–0.2)

## 2023-09-17 LAB — BLOOD GAS, ARTERIAL
Acid-base deficit: 10.4 mmol/L — ABNORMAL HIGH (ref 0.0–2.0)
Bicarbonate: 17.9 mmol/L — ABNORMAL LOW (ref 20.0–28.0)
Drawn by: 27016
O2 Saturation: 98.8 %
Patient temperature: 36.4
pCO2 arterial: 47 mmHg (ref 32–48)
pH, Arterial: 7.19 — CL (ref 7.35–7.45)
pO2, Arterial: 115 mmHg — ABNORMAL HIGH (ref 83–108)

## 2023-09-17 LAB — CREATININE, SERUM
Creatinine, Ser: 1.44 mg/dL — ABNORMAL HIGH (ref 0.61–1.24)
GFR, Estimated: 51 mL/min — ABNORMAL LOW (ref >60)

## 2023-09-17 LAB — CBC
HCT: 40 % (ref 39.0–52.0)
Hemoglobin: 12.8 g/dL — ABNORMAL LOW (ref 13.0–17.0)
MCH: 35.1 pg — ABNORMAL HIGH (ref 26.0–34.0)
MCHC: 32 g/dL (ref 30.0–36.0)
MCV: 109.6 fL — ABNORMAL HIGH (ref 80.0–100.0)
Platelets: 231 K/uL (ref 150–400)
RBC: 3.65 MIL/uL — ABNORMAL LOW (ref 4.22–5.81)
RDW: 15.1 % (ref 11.5–15.5)
WBC: 20.7 K/uL — ABNORMAL HIGH (ref 4.0–10.5)
nRBC: 0 % (ref 0.0–0.2)

## 2023-09-17 LAB — COMPREHENSIVE METABOLIC PANEL WITH GFR
ALT: 10 U/L (ref 0–44)
ALT: 10 U/L (ref 0–44)
AST: 17 U/L (ref 15–41)
AST: 15 U/L (ref 15–41)
Albumin: 3.5 g/dL (ref 3.5–5.0)
Albumin: 3.2 g/dL — ABNORMAL LOW (ref 3.5–5.0)
Alkaline Phosphatase: 48 U/L (ref 38–126)
Alkaline Phosphatase: 43 U/L (ref 38–126)
Anion gap: 14 (ref 5–15)
Anion gap: 12 (ref 5–15)
BUN: 19 mg/dL (ref 8–23)
BUN: 17 mg/dL (ref 8–23)
CO2: 23 mmol/L (ref 22–32)
CO2: 23 mmol/L (ref 22–32)
Calcium: 8.9 mg/dL (ref 8.9–10.3)
Calcium: 8.6 mg/dL — ABNORMAL LOW (ref 8.9–10.3)
Chloride: 104 mmol/L (ref 98–111)
Chloride: 104 mmol/L (ref 98–111)
Creatinine, Ser: 1.02 mg/dL (ref 0.61–1.24)
Creatinine, Ser: 0.93 mg/dL (ref 0.61–1.24)
GFR, Estimated: >60 mL/min (ref >60)
GFR, Estimated: >60 mL/min (ref >60)
Glucose, Bld: 70 mg/dL (ref 70–99)
Glucose, Bld: 74 mg/dL (ref 70–99)
Potassium: 3.9 mmol/L (ref 3.5–5.1)
Potassium: 3.8 mmol/L (ref 3.5–5.1)
Sodium: 141 mmol/L (ref 135–145)
Sodium: 139 mmol/L (ref 135–145)
Total Bilirubin: 0.6 mg/dL (ref 0.0–1.2)
Total Bilirubin: 0.6 mg/dL (ref 0.0–1.2)
Total Protein: 7.2 g/dL (ref 6.5–8.1)
Total Protein: 6.6 g/dL (ref 6.5–8.1)

## 2023-09-17 LAB — POCT I-STAT 7, (LYTES, BLD GAS, ICA,H+H)
Acid-base deficit: 7 mmol/L — ABNORMAL HIGH (ref 0.0–2.0)
Bicarbonate: 19.8 mmol/L — ABNORMAL LOW (ref 20.0–28.0)
Calcium, Ion: 1.26 mmol/L (ref 1.15–1.40)
HCT: 38 % — ABNORMAL LOW (ref 39.0–52.0)
Hemoglobin: 12.9 g/dL — ABNORMAL LOW (ref 13.0–17.0)
O2 Saturation: 94 %
Patient temperature: 98.8
Potassium: 3.3 mmol/L — ABNORMAL LOW (ref 3.5–5.1)
Sodium: 138 mmol/L (ref 135–145)
TCO2: 21 mmol/L — ABNORMAL LOW (ref 22–32)
pCO2 arterial: 46.2 mmHg (ref 32–48)
pH, Arterial: 7.241 — ABNORMAL LOW (ref 7.35–7.45)
pO2, Arterial: 83 mmHg (ref 83–108)

## 2023-09-17 LAB — TROPONIN I (HIGH SENSITIVITY): Troponin I (High Sensitivity): 9 ng/L (ref <18)

## 2023-09-17 LAB — MAGNESIUM: Magnesium: 1.9 mg/dL (ref 1.7–2.4)

## 2023-09-17 LAB — LACTIC ACID, PLASMA: Lactic Acid, Venous: 7.7 mmol/L (ref 0.5–1.9)

## 2023-09-17 LAB — PROTIME-INR
INR: 1.1 (ref 0.8–1.2)
Prothrombin Time: 15 s (ref 11.4–15.2)

## 2023-09-17 LAB — CBG MONITORING, ED
Glucose-Capillary: 250 mg/dL — ABNORMAL HIGH (ref 70–99)
Glucose-Capillary: 68 mg/dL — ABNORMAL LOW (ref 70–99)

## 2023-09-17 LAB — PSA: Prostatic Specific Antigen: <0.02 ng/mL (ref 0.00–4.00)

## 2023-09-17 MED ORDER — EPINEPHRINE HCL 5 MG/250ML IV SOLN IN NS
INTRAVENOUS | Status: AC
  Administered 2023-09-17: 5 mg via INTRAVENOUS
  Filled 2023-09-17: qty 250

## 2023-09-17 MED ORDER — CHLORHEXIDINE GLUCONATE CLOTH 2 % EX PADS
6.0000 | MEDICATED_PAD | Freq: Every day | CUTANEOUS | Status: DC
  Administered 2023-09-18 – 2023-09-21 (×4): 6 via TOPICAL

## 2023-09-17 MED ORDER — HEPARIN SODIUM (PORCINE) 5000 UNIT/ML IJ SOLN
5000.0000 [IU] | Freq: Three times a day (TID) | INTRAMUSCULAR | Status: DC
  Administered 2023-09-17 – 2023-09-21 (×11): 5000 [IU] via SUBCUTANEOUS
  Filled 2023-09-17 (×11): qty 1

## 2023-09-17 MED ORDER — AMIODARONE HCL IN DEXTROSE 360-4.14 MG/200ML-% IV SOLN
60.0000 mg/h | INTRAVENOUS | Status: DC
Start: 2023-09-17 — End: 2023-09-18

## 2023-09-17 MED ORDER — ACETAMINOPHEN 160 MG/5ML PO SOLN: 650.0000 mg | ORAL | Status: DC | PRN

## 2023-09-17 MED ORDER — MAGNESIUM SULFATE 2 GM/50ML IV SOLN
2.0000 g | Freq: Once | INTRAVENOUS | Status: AC | PRN
  Administered 2023-09-18: 2 g via INTRAVENOUS
  Filled 2023-09-17 (×2): qty 50

## 2023-09-17 MED ORDER — FENTANYL CITRATE PF 50 MCG/ML IJ SOSY
50.0000 ug | PREFILLED_SYRINGE | INTRAMUSCULAR | Status: DC | PRN
  Administered 2023-09-17: 50 ug via INTRAVENOUS

## 2023-09-17 MED ORDER — FAMOTIDINE 20 MG PO TABS
20.0000 mg | ORAL_TABLET | Freq: Two times a day (BID) | ORAL | Status: DC
Start: 2023-09-17 — End: 2023-09-18
  Administered 2023-09-17: 20 mg
  Filled 2023-09-17: qty 1

## 2023-09-17 MED ORDER — ETOMIDATE 2 MG/ML IV SOLN
INTRAVENOUS | Status: AC
  Filled 2023-09-17: qty 20

## 2023-09-17 MED ORDER — ACETAMINOPHEN 650 MG RE SUPP: 650.0000 mg | RECTAL | Status: DC | PRN

## 2023-09-17 MED ORDER — FENTANYL 2500MCG IN NS 250ML (10MCG/ML) PREMIX INFUSION
25.0000 ug/h | INTRAVENOUS | Status: DC
  Administered 2023-09-17: 25 ug/h via INTRAVENOUS
  Filled 2023-09-17 (×2): qty 250

## 2023-09-17 MED ORDER — CALCIUM CHLORIDE 10 % IV SOLN
INTRAVENOUS | Status: AC | PRN
  Administered 2023-09-17: 1 g via INTRAVENOUS

## 2023-09-17 MED ORDER — AMIODARONE HCL 150 MG/3ML IV SOLN
INTRAVENOUS | Status: AC | PRN
Start: 2023-09-17 — End: 2023-09-17
  Administered 2023-09-17: 300 mg via INTRAVENOUS
  Administered 2023-09-17: 150 mg via INTRAVENOUS

## 2023-09-17 MED ORDER — BUSPIRONE HCL 10 MG PO TABS: 30.0000 mg | ORAL_TABLET | Freq: Three times a day (TID) | ORAL | Status: DC | PRN

## 2023-09-17 MED ORDER — DEXTROSE 50 % IV SOLN
INTRAVENOUS | Status: AC | PRN
  Administered 2023-09-17: 1 via INTRAVENOUS

## 2023-09-17 MED ORDER — FENTANYL CITRATE (PF) 100 MCG/2ML IJ SOLN: 50.0000 ug | INTRAMUSCULAR | Status: DC | PRN

## 2023-09-17 MED ORDER — SODIUM CHLORIDE 0.9 % IV BOLUS
1000.0000 mL | Freq: Once | INTRAVENOUS | Status: AC
  Administered 2023-09-17: 1000 mL via INTRAVENOUS

## 2023-09-17 MED ORDER — SODIUM CHLORIDE 0.9 % IV SOLN
250.0000 mL | INTRAVENOUS | Status: AC
Start: 2023-09-17 — End: 2023-09-18
  Administered 2023-09-18: 250 mL via INTRAVENOUS

## 2023-09-17 MED ORDER — DOCUSATE SODIUM 50 MG/5ML PO LIQD
100.0000 mg | Freq: Two times a day (BID) | ORAL | Status: DC
  Administered 2023-09-17: 100 mg
  Filled 2023-09-17: qty 10

## 2023-09-17 MED ORDER — MIDAZOLAM HCL 2 MG/2ML IJ SOLN
INTRAMUSCULAR | Status: AC
  Administered 2023-09-17: 2 mg via INTRAVENOUS
  Filled 2023-09-17: qty 2

## 2023-09-17 MED ORDER — EPINEPHRINE HCL 5 MG/250ML IV SOLN IN NS
0.5000 ug/min | INTRAVENOUS | Status: DC
  Administered 2023-09-17: 10 ug/min via INTRAVENOUS
  Administered 2023-09-18: 4 ug/min via INTRAVENOUS
  Administered 2023-09-18: 6 ug/min via INTRAVENOUS
  Filled 2023-09-17 (×2): qty 250

## 2023-09-17 MED ORDER — MIDAZOLAM HCL 2 MG/2ML IJ SOLN
1.0000 mg | INTRAMUSCULAR | Status: DC | PRN
  Administered 2023-09-17: 2 mg via INTRAVENOUS
  Filled 2023-09-17 (×3): qty 2

## 2023-09-17 MED ORDER — ACETAMINOPHEN 650 MG RE SUPP: 650.0000 mg | RECTAL | Status: DC

## 2023-09-17 MED ORDER — LIDOCAINE BOLUS VIA INFUSION
75.0000 mg | Freq: Once | INTRAVENOUS | Status: AC
  Administered 2023-09-17: 75 mg via INTRAVENOUS
  Filled 2023-09-17: qty 76

## 2023-09-17 MED ORDER — FENTANYL CITRATE (PF) 100 MCG/2ML IJ SOLN
50.0000 ug | INTRAMUSCULAR | Status: DC | PRN
  Administered 2023-09-17: 100 ug via INTRAVENOUS
  Filled 2023-09-17 (×2): qty 2

## 2023-09-17 MED ORDER — SODIUM CHLORIDE 0.9 % IV BOLUS
500.0000 mL | Freq: Once | INTRAVENOUS | Status: AC
  Administered 2023-09-17: 500 mL via INTRAVENOUS

## 2023-09-17 MED ORDER — PROPOFOL 1000 MG/100ML IV EMUL: 0.0000 ug/kg/min | INTRAVENOUS | Status: DC

## 2023-09-17 MED ORDER — POLYETHYLENE GLYCOL 3350 17 G PO PACK: 17.0000 g | PACK | Freq: Every day | ORAL | Status: DC

## 2023-09-17 MED ORDER — FENTANYL BOLUS VIA INFUSION
25.0000 ug | INTRAVENOUS | Status: DC | PRN
  Administered 2023-09-17 – 2023-09-18 (×3): 50 ug via INTRAVENOUS

## 2023-09-17 MED ORDER — PROPOFOL 1000 MG/100ML IV EMUL
0.0000 ug/kg/min | INTRAVENOUS | Status: DC
  Administered 2023-09-18 (×2): 40 ug/kg/min via INTRAVENOUS
  Filled 2023-09-17 (×3): qty 100

## 2023-09-17 MED ORDER — EPINEPHRINE 1 MG/10ML IJ SOSY
PREFILLED_SYRINGE | INTRAMUSCULAR | Status: AC | PRN
Start: 2023-09-17 — End: 2023-09-17
  Administered 2023-09-17: 1 mg via INTRAVENOUS

## 2023-09-17 MED ORDER — ETOMIDATE 2 MG/ML IV SOLN
INTRAVENOUS | Status: AC | PRN
Start: 2023-09-17 — End: 2023-09-17
  Administered 2023-09-17: 20 mg via INTRAVENOUS

## 2023-09-17 MED ORDER — LIDOCAINE IN D5W 4-5 MG/ML-% IV SOLN
1.0000 mg/min | INTRAVENOUS | Status: DC
  Administered 2023-09-17 – 2023-09-18 (×2): 1 mg/min via INTRAVENOUS
  Filled 2023-09-17 (×3): qty 500

## 2023-09-17 MED ORDER — AMIODARONE HCL IN DEXTROSE 360-4.14 MG/200ML-% IV SOLN
30.0000 mg/h | INTRAVENOUS | Status: DC
Start: 2023-09-18 — End: 2023-09-18

## 2023-09-17 MED ORDER — ACETAMINOPHEN 325 MG PO TABS
650.0000 mg | ORAL_TABLET | ORAL | Status: DC
  Administered 2023-09-17 – 2023-09-19 (×5): 650 mg via ORAL
  Filled 2023-09-17 (×6): qty 2

## 2023-09-17 MED ORDER — ONDANSETRON HCL 4 MG/2ML IJ SOLN: 4.0000 mg | Freq: Four times a day (QID) | INTRAMUSCULAR | Status: DC | PRN

## 2023-09-17 MED ORDER — AMIODARONE HCL IN DEXTROSE 360-4.14 MG/200ML-% IV SOLN
INTRAVENOUS | Status: AC
  Administered 2023-09-17: 60 mg/h via INTRAVENOUS
  Filled 2023-09-17: qty 200

## 2023-09-17 MED ORDER — NOREPINEPHRINE 4 MG/250ML-% IV SOLN
2.0000 ug/min | INTRAVENOUS | Status: DC
  Administered 2023-09-18: 10 ug/min via INTRAVENOUS
  Administered 2023-09-18: 2 ug/min via INTRAVENOUS
  Filled 2023-09-17 (×2): qty 250

## 2023-09-17 MED ORDER — ACETAMINOPHEN 160 MG/5ML PO SOLN
650.0000 mg | ORAL | Status: DC
  Administered 2023-09-18 (×2): 650 mg
  Filled 2023-09-17: qty 20.3

## 2023-09-17 MED ORDER — SODIUM BICARBONATE 8.4 % IV SOLN
INTRAVENOUS | Status: AC | PRN
  Administered 2023-09-17: 50 meq via INTRAVENOUS

## 2023-09-17 MED ORDER — FENTANYL CITRATE PF 50 MCG/ML IJ SOSY
25.0000 ug | PREFILLED_SYRINGE | Freq: Once | INTRAMUSCULAR | Status: AC
  Administered 2023-09-17: 25 ug via INTRAVENOUS
  Filled 2023-09-17: qty 1

## 2023-09-17 MED ORDER — FENTANYL CITRATE (PF) 100 MCG/2ML IJ SOLN
50.0000 ug | INTRAMUSCULAR | Status: DC | PRN
  Administered 2023-09-17: 50 ug via INTRAVENOUS
  Filled 2023-09-17 (×2): qty 2

## 2023-09-17 MED ORDER — ENZALUTAMIDE 40 MG PO TABS
ORAL_TABLET | ORAL | 3 refills | Status: DC
  Filled 2023-09-17: qty 120, 30d supply, fill #0
  Filled 2023-10-16: qty 120, 30d supply, fill #1
  Filled 2023-11-08: qty 120, 30d supply, fill #2
  Filled 2023-12-12: qty 120, 30d supply, fill #3

## 2023-09-17 MED ORDER — ORAL CARE MOUTH RINSE
15.0000 mL | OROMUCOSAL | Status: DC
  Administered 2023-09-18 (×6): 15 mL via OROMUCOSAL

## 2023-09-17 MED ORDER — PROPOFOL 1000 MG/100ML IV EMUL
INTRAVENOUS | Status: AC
  Administered 2023-09-17: 5 ug/kg/min via INTRAVENOUS
  Filled 2023-09-17: qty 100

## 2023-09-17 MED ORDER — ACETAMINOPHEN 325 MG PO TABS
650.0000 mg | ORAL_TABLET | ORAL | Status: DC | PRN
  Filled 2023-09-17: qty 2

## 2023-09-17 MED ORDER — ORAL CARE MOUTH RINSE: 15.0000 mL | OROMUCOSAL | Status: DC | PRN

## 2023-09-17 MED ORDER — FENTANYL CITRATE PF 50 MCG/ML IJ SOSY: 50.0000 ug | PREFILLED_SYRINGE | INTRAMUSCULAR | Status: DC | PRN

## 2023-09-17 NOTE — Progress Notes (Signed)
 eLink Physician-Brief Progress Note Patient Name: Kyle George DOB: 09-Jan-1951 MRN: [id number]   Date of Service  09/17/2023  HPI/Events of Note  73/M with metastatic prostate CA, sent to the ED earlier today for bradycardia. During the course of his evaluation in the ED, he went into Vfib arrest. ACLS was immediately started. He was defibrillated 4 times, given amiodarone , and was started on lidocaine  drip. He was intubated for airway protection as well during the course of the code. He is now in the ICU for close monitoring and further management.   eICU Interventions  S/p Vib arrest - Defibrillated, now on lidocaine  drip.  - Maintain on continuous cardiac monitoring - Serial BMP. Would target K 4, Mg 2 - Trend troponin, follow serial EKG - Plan for echocardiogram.  - Will follow further cardiology plans/recommendations  Acute respiratory failure - TV 4-73ml/kg PBW, target plateau pressures <30 - Titrate FiO2, PEEP to maintain SpO2 >90% - Will monitor ABGs, make further adjustments as warranted - Plan for daily interruption of sedation, SBT when appropriate.         Amparo Donalson M DELA CRUZ 09/17/2023, 10:40 PM  12:28 AM Notified of lactate 7.9.  Has received >3L fluids, currently intubated on FiO2 60% Echo pending.  Will hold off on additional bolus for now.  Titrate pressors to keep MAP >65 Will continue to trend lactate.   12:50 AM Contacted by cardiologist.  He had recommended starting the patient on isoproterenol  for bradycardia.  Ordered isoproterenol , will wean epinephrine  as appropriate.

## 2023-09-17 NOTE — Progress Notes (Signed)
   PCCM transfer request    Sending physician: Garrick  Sending facility: [facility] ED  Reason for transfer: respiratory failure  Brief case summary: 73 y/o gentleman with metastatic prostate cancer to bone. Went to cancer center for infusion today, had bradycardia>  presented to the ED with asymptomatic bradycardia- was initially awake & alert with HR in 30s. VF Cardiac arrest, shocked x 4, intubated, sedated. Until started on amiodarone  kept giong back into VF. Also on epinephrine , now with HR in 50s, long PR interval. A few weeks ago had slow rates, coreg  had been stopped then. Cardiology being consulted as well.   Recommendations made prior to transfer: load with lidocaine  (ordered), ask cardiology if they still want amiodarone  with the bradycardia  Transfer accepted: yes- [facility] 2H    Kyle George 09/17/23 7:06 PM [facility]  For contact information, see Amion. If no response to pager, please call PCCM consult pager. After hours, 7PM- 7AM, please call Elink.

## 2023-09-17 NOTE — Code Documentation (Signed)
 Pt intubated by Dr Garrick

## 2023-09-17 NOTE — Patient Instructions (Signed)
 [facility] **VISIT SUMMARY & IMPORTANT INSTRUCTIONS **   You were seen today by Kyle Barefoot PA-C for your prostate cancer.    PROSTATE CANCER: You are currently doing very well on your prostate cancer treatment! Continue taking Xtandi  160 mg daily. Continue Xgeva  injection once a month for metastatic bone disease. Continue Eligard  injection every 4 months. **Since your Eligard  was out of stock today, we will give you your injections tomorrow instead of today.  LOW HEART RATE We will send you to the emergency department for further evaluation of your low heart rate, at the request of your primary care provider.  FOLLOW-UP APPOINTMENT: Labs and office visit with Dr. Davonna in 4 months.  ** Thank you for trusting me with your healthcare!  I strive to provide all of my patients with quality care at each visit.  If you receive a survey for this visit, I would be so grateful to you for taking the time to provide feedback.  Thank you in advance!  ~ Kyle George                                        Dr. Mickiel George Kyle Barefoot, PA-C          Kyle Hope, NP   - - - - - - - - - - - - - - - - - -    Thank you for choosing [facility] at [facility] to provide your oncology and hematology care.  To afford each patient quality time with our provider, please arrive at least 15 minutes before your scheduled appointment time.   If you have a lab appointment with the Cancer Center please come in thru the Main Entrance and check in at the main information desk.  You need to re-schedule your appointment should you arrive 10 or more minutes late.  We strive to give you quality time with our providers, and arriving late affects you and other patients whose appointments are after yours.  Also, if you no show three or more times for appointments you may be dismissed from the clinic at the providers discretion.     Again, thank  you for choosing [facility].  Our George is that these requests will decrease the amount of time that you wait before being seen by our physicians.       _____________________________________________________________  Should you have questions after your visit to [facility], please contact our office at [phone] and follow the prompts.  Our office hours are 8:00 a.m. and 4:30 p.m. Monday - Friday.  Please note that voicemails left after 4:00 p.m. may not be returned until the following business day.  We are closed weekends and major holidays.  You do have access to a nurse 24-7, just call the main number to the clinic [phone] and do not press any options, hold on the line and a nurse will answer the phone.    For prescription refill requests, have your pharmacy contact our office and allow 72 hours.

## 2023-09-17 NOTE — ED Provider Notes (Signed)
 [facility] AT [facility] Provider Note   CSN: [id number] Arrival date & time: 09/17/23  1550     Patient presents with: Bradycardia   Kyle George is a 73 y.o. male.   HPI Patient presents from oncology center with concern for bradycardia.  The patient self denies complaints, states I feel great.  I discussed this case with the physician assistant at oncology prior to transfer.  She notes that the patient recently stopped his Coreg  due to concern for bradycardia, but now 2 weeks later he remains bradycardic.  Today's plan infusion for prostate cancer was held secondary to this and he was sent here for evaluation.   Prior to Admission medications   Medication Sig Start Date End Date Taking? Authorizing Provider  aspirin 81 MG tablet Take 81 mg by mouth daily.    [provider]  blood glucose meter kit and supplies KIT Dispense based on patient and insurance preference. Use up to four times daily as directed. 03/24/20   [name], [name]  Blood Pressure Monitoring (BLOOD PRESSURE CUFF) MISC by Does not apply route. Check blood pressure at least 4 times per week 3 hours after taking medication    [provider]  Calcium  Carbonate-Vitamin D  (CALCIUM  600+D PO) Take 1 tablet by mouth daily. Taking calcium  + D3    [provider]  carvedilol  (COREG ) 6.25 MG tablet TAKE 1 TABLET BY MOUTH TWICE A DAY WITH FOOD Patient not taking: Reported on 09/17/2023 07/05/23   [name], [name]  cholecalciferol (VITAMIN D ) 1000 UNITS tablet Take 5,000 Units by mouth daily.    [provider]  enzalutamide  (XTANDI ) 40 MG tablet TAKE 4 TABLETS (160 MG TOTAL) BY MOUTH DAILY. 09/17/23 09/16/24  Lamon Pleasant HERO, PA-C  glucose blood test strip 1 each by Other route as needed for other. Use as instructed to check blood sugars    [provider]  Lancets (ONETOUCH DELICA PLUS LANCET33G) MISC by Does not apply route. Use as directed to  check blood  Sugars 2 times per day dx E11.65    [provider]  Multiple Vitamin (MULTIVITAMIN WITH MINERALS) TABS Take 1 tablet by mouth daily.    [provider]  NON FORMULARY Take 1 tablet by mouth daily. Thyroid   action    [provider]  Olmesartan -amLODIPine -HCTZ 40-10-25 MG TABS TAKE 1 TABLET BY MOUTH EVERY DAY 04/19/23   [name], [name]  Omega-3 Fatty Acids (FISH OIL PO) Take 1 tablet by mouth once.    [provider]  pioglitazone -metformin  (ACTOPLUS MET ) 15-850 MG tablet Take 1 tablet by mouth 2 (two) times daily. 04/19/23   [name], [name]  rosuvastatin  (CRESTOR ) 10 MG tablet TAKE 1 TABLET BY MOUTH EVERY DAY 07/16/23   [name], [name]  Semaglutide ,0.25 or 0.5MG /DOS, (OZEMPIC , 0.25 OR 0.5 MG/DOSE,) 2 MG/3ML SOPN INJECT 0.25 MG WEEKLY FOR 4 WEEKS, THEN INCREASE TO 0.5 MG WEEKLY 07/02/23   [name], [name]  spironolactone  (ALDACTONE ) 25 MG tablet TAKE 1 TABLET (25 MG TOTAL) BY MOUTH DAILY. 07/05/23   [name], [name]    Allergies: Patient has no known allergies.    Review of Systems  Updated Vital Signs BP 114/64   Pulse (!) 46   Temp 97.6 F (36.4 C) (Oral)   Resp (!) 29   Ht 1.702 m (5' 7)   Wt 75 kg   SpO2 100%   BMI 25.90 kg/m   Physical Exam Vitals and nursing note  reviewed.  Constitutional:      General: He is not in acute distress.    Appearance: He is well-developed.  HENT:     Head: Normocephalic and atraumatic.  Eyes:     Conjunctiva/sclera: Conjunctivae normal.  Cardiovascular:     Rate and Rhythm: Regular rhythm. Bradycardia present.  Pulmonary:     Effort: Pulmonary effort is normal. No respiratory distress.     Breath sounds: No stridor.  Abdominal:     General: There is no distension.  Skin:    General: Skin is warm and dry.  Neurological:     Mental Status: He is alert and oriented to person, place, and time.     (all labs ordered are listed, but only abnormal results are displayed) Labs  Reviewed  CBC WITH DIFFERENTIAL/PLATELET - Abnormal; Notable for the following components:      Result Value   RBC 3.96 (*)    MCV 108.1 (*)    MCH 35.6 (*)    All other components within normal limits  CBC WITH DIFFERENTIAL/PLATELET - Abnormal; Notable for the following components:   RBC 3.78 (*)    MCV 106.6 (*)    MCH 35.4 (*)    All other components within normal limits  COMPREHENSIVE METABOLIC PANEL WITH GFR - Abnormal; Notable for the following components:   Calcium  8.6 (*)    Albumin 3.2 (*)    All other components within normal limits  CBG MONITORING, ED - Abnormal; Notable for the following components:   Glucose-Capillary 68 (*)    All other components within normal limits  CBG MONITORING, ED - Abnormal; Notable for the following components:   Glucose-Capillary 250 (*)    All other components within normal limits  MAGNESIUM   BLOOD GAS, ARTERIAL  LACTIC ACID, PLASMA  LACTIC ACID, PLASMA  TROPONIN I (HIGH SENSITIVITY)    EKG: None  Radiology: No results found.   Procedures   Medications Ordered in the ED  amiodarone  (NEXTERONE  PREMIX) 360-4.14 MG/200ML-% (1.8 mg/mL) IV infusion (60 mg/hr Intravenous New Bag/Given 09/17/23 1841)    Followed by  amiodarone  (NEXTERONE  PREMIX) 360-4.14 MG/200ML-% (1.8 mg/mL) IV infusion (has no administration in time range)  fentaNYL  (SUBLIMAZE ) injection 50 mcg (50 mcg Intravenous Given 09/17/23 1859)  fentaNYL  (SUBLIMAZE ) injection 50-200 mcg (has no administration in time range)  midazolam  (VERSED ) injection 1-2 mg (has no administration in time range)  lidocaine  (XYLOCAINE ) 4 mg/mL bolus via infusion 75 mg (has no administration in time range)    And  lidocaine  (cardiac) 2000 mg in dextrose  5% 500 mL (4mg /mL) IV infusion (has no administration in time range)  EPINEPHrine  (ADRENALIN ) 5 mg in NS 250 mL (0.02 mg/mL) premix infusion (has no administration in time range)  sodium chloride  0.9 % bolus 500 mL (0 mLs Intravenous Stopped  09/17/23 1758)  EPINEPHrine  NaCl 5-0.9 MG/250ML-% premix infusion (5 mg  New Bag/Given 09/17/23 1835)  midazolam  (VERSED ) 2 MG/2ML injection (2 mg  Given 09/17/23 1854)  EPINEPHrine  (ADRENALIN ) 1 MG/10ML injection (1 mg Intravenous Given 09/17/23 1823)  amiodarone  (CORDARONE ) injection (150 mg Intravenous Given 09/17/23 1831)  etomidate  (AMIDATE ) injection ( Intravenous Canceled Entry 09/17/23 1845)  sodium bicarbonate  injection (50 mEq Intravenous Given 09/17/23 1833)  calcium  chloride injection (1 g Intravenous Given 09/17/23 1834)  dextrose  50 % solution (1 ampule Intravenous Given 09/17/23 1841)  Medical Decision Making Adult male presents with bradycardia.  Patient is asymptomatic, awake, alert, unsure why he is here, stating that he feels well.  Broad differential including medication effects, electrolyte disturbance, dehydration, intrinsic arrhythmia/cardiac disturbance. On monitor patient has variable sinus bradycardia rates, 30s, 40s.  Abnormal Pulse ox 98% room air normal   Amount and/or Complexity of Data Reviewed Independent Historian:     Details: PA from oncology External Data Reviewed: notes. Labs: ordered. Decision-making details documented in ED Course. Radiology: ordered and independent interpretation performed. Decision-making details documented in ED Course. ECG/medicine tests: ordered and independent interpretation performed. Decision-making details documented in ED Course.  Risk Prescription drug management. Decision regarding hospitalization.   Reportedly a few minutes after requesting discharge the patient was noted to be unresponsive and monitor found to have evidence for ventricular fibrillation.  Subsequently patient had initiation of CPR, and required multiple defibrillation shocks, initiation of amiodarone , and other meds per protocol, see nursing notes for full details. Patient required intubation for airway protection, this was  accomplished without complication, first-pass after dentures were removed. After multiple episodes of V-fib/loss of pulses/defibrillation, spontaneous pulses were obtained, sustained, and I was able to talk to the patient's family. They note that has been doing well, they are aware of efforts to address his bradycardia as an outpatient, and note that he typically has hypoglycemia.  7:22 PM Patient remains sedated, heart rate 50s, apparent complete heart block with episodes of sinus bradycardia, largely ventricular escape rhythm.  I have discussed the patient's case with the critical care colleagues, patient will be transferred to [facility] for ongoing care.  I discussed the patient's presentation with our cardiology colleagues.  They will follow the consulting team and evaluate the patient on arrival to [facility].  Final diagnoses:  Cardiac arrest [facility])   CRITICAL CARE Performed by: Lamar Salen Total critical care time: 45 minutes Critical care time was exclusive of separately billable procedures and treating other patients. Critical care was necessary to treat or prevent imminent or life-threatening deterioration. Critical care was time spent personally by me on the following activities: development of treatment plan with patient and/or surrogate as well as nursing, discussions with consultants, evaluation of patient's response to treatment, examination of patient, obtaining history from patient or surrogate, ordering and performing treatments and interventions, ordering and review of laboratory studies, ordering and review of radiographic studies, pulse oximetry and re-evaluation of patient's condition.    Salen Lamar, MD 09/17/23 2029

## 2023-09-17 NOTE — Progress Notes (Signed)
 No injections today per PA,  patient is being sent to the emergency department.

## 2023-09-17 NOTE — H&P (Signed)
 NAME:  Kyle George, MRN:  [id number], DOB:  [birth year], LOS: 0 ADMISSION DATE:  09/17/2023, CONSULTATION DATE:  9/8 REFERRING MD: EDP, CHIEF COMPLAINT:  VF cardiac arrest   History of Present Illness:  73 yo male presented from the cancer center today with asymptomatic bradycardia. He initially was in the ED awake and alert, talking, but suffered a VF arrest. He required defibrillation x 4, amiodarone  to remain out of VF. He is bradycardic with rates in the 50s on amiodarone  and epinephrine  infusions. He was intubated during arrest. Cardiology is being consulted. PCCM was consulted for admission to [facility].  All history is obtained from chart and quite limited, as pt is intubated at this time.  Lactate >7 pH 7.1, mag and potassium all within normal range.  Pt arrived to our icu bradycardic and irreg. Rate in 30's on lidocaine /propofol  and 10 of epi. Cardiology pending their evaluation as they had also ordered amio but already on lido and already with HR in 30's.   Pertinent  Medical History  Stage IV prostate cancer HTN HLD DM  Significant Hospital Events: Including procedures, antibiotic start and stop dates in addition to other pertinent events   9/8 VF cardiac arrest after bradycardia  Interim History / Subjective:    Objective    Blood pressure 114/64, pulse (!) 46, temperature 97.6 F (36.4 C), temperature source Oral, resp. rate (!) 29, height 5' 7 (1.702 m), weight 75 kg, SpO2 100%.    Vent Mode: PRVC FiO2 (%):  [100 %] 100 % Set Rate:  [24 bmp] 24 bmp Vt Set:  [520 mL] 520 mL PEEP:  [8 cmH20] 8 cmH20   Intake/Output Summary (Last 24 hours) at 09/17/2023 1906 Last data filed at 09/17/2023 1758 Gross per 24 hour  Intake 500 ml  Output --  Net 500 ml   Filed Weights   09/17/23 1601  Weight: 75 kg    Examination: General: sedated unresponsive on vent, appears chronically ill HENT: ncat, pupils pinpoint and sluggish, mmmp Lungs: diminished on R side, cta  L Cardiovascular: irreg bradycardic Abdomen: soft nt, nd bs diminished Extremities: no c/c/e Neuro: not following commands, not purposeful, no withdrawal to painful stim, on propofol  GU: deferred  Resolved problem list   Assessment and Plan   VF cardiac arrest Bradycardic arrhythmia -per cards recommendations -monitor on tele -echo pending -ensure mag >2 and K >4 -ttm protocol ongoing -undetermined cause -currently on lidocaine  infusion with amio gtt ordered as well, but HR in 30's Acute respiratory failure with hypoxia requiring MV -2/2 above -titrate vent -neuro exams and once improved and following commands can attempt extubation -cxr pending with diminished R lung sounds.  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  -eeg pending per ttm protocol  Labs   CBC: Recent Labs  Lab 09/17/23 1327 09/17/23 1605 09/17/23 1814  WBC 6.9 PENDING 6.7  NEUTROABS 4.6 PENDING 4.6  HGB 14.1 14.1 13.4  HCT 42.4 42.8 40.3  MCV 107.1* 108.1* 106.6*  PLT 202 188 186    Basic Metabolic Panel: Recent Labs  Lab 09/17/23 1327 09/17/23 1814  NA 141 139  K 3.9 3.8  CL 104 104  CO2 23 23  GLUCOSE 70 74  BUN 19 17  CREATININE 1.02 0.93  CALCIUM  8.9 8.6*  MG  --  1.9   GFR: Estimated Creatinine Clearance: 65.2 mL/min (by C-G  formula based on SCr of 0.93 mg/dL). Recent Labs  Lab 09/17/23 1327 09/17/23 1605 09/17/23 1814  WBC 6.9 PENDING 6.7    Liver Function Tests: Recent Labs  Lab 09/17/23 1327 09/17/23 1814  AST 17 15  ALT 10 10  ALKPHOS 48 43  BILITOT 0.6 0.6  PROT 7.2 6.6  ALBUMIN 3.5 3.2*   No results for input(s): LIPASE, AMYLASE in the last 168 hours. No results for input(s): AMMONIA in the last 168 hours.  ABG No results found for: PHART, PCO2ART, PO2ART, HCO3, TCO2, ACIDBASEDEF, O2SAT    Coagulation Profile: No results for input(s): INR, PROTIME in the last 168 hours.  Cardiac Enzymes: No results for input(s): CKTOTAL, CKMB, CKMBINDEX, TROPONINI in the last 168 hours.  HbA1C: Hgb A1c MFr Bld  Date/Time Value Ref Range Status  08/06/2023 12:30 PM 5.3 4.8 - 5.6 % Final    Comment:             Prediabetes: 5.7 - 6.4          Diabetes: >6.4          Glycemic control for adults with diabetes: <7.0   04/05/2023 12:41 PM 5.5 4.8 - 5.6 % Final    Comment:             Prediabetes: 5.7 - 6.4          Diabetes: >6.4          Glycemic control for adults with diabetes: <7.0     CBG: Recent Labs  Lab 09/17/23 1837 09/17/23 1854  GLUCAP 68* 250*    Review of Systems:   Unable to be obtained due to intubated and sedated status  Past Medical History:  He,  has a past medical history of Cancer of prostate (HCC), Diabetes mellitus without complication (HCC), Hyperlipidemia, and Hypertension.   Surgical History:   Past Surgical History:  Procedure Laterality Date   COLONOSCOPY     HEMORRHOID SURGERY       Social History:   reports that he has been smoking cigarettes. He has a 12.5 pack-year smoking history. He quit smokeless tobacco use about 50 years ago.  His smokeless tobacco use included chew. He reports current alcohol use. He reports that he does not use drugs.   Family History:  His family history includes Cancer in his father and paternal uncle; Diabetes in his mother; Hypertension in his mother. There is no history of Colon cancer, Esophageal cancer, Stomach cancer, or Rectal cancer.   Allergies No Known Allergies   Home Medications  Prior to Admission medications   Medication Sig Start Date End Date Taking? Authorizing Provider  aspirin 81 MG tablet Take 81 mg by mouth daily.    [provider]  blood glucose meter kit and supplies KIT Dispense based on patient and insurance preference. Use up to four times daily as directed.  03/24/20   [name], [name]  Blood Pressure Monitoring (BLOOD PRESSURE CUFF) MISC by Does not apply route. Check blood pressure at least 4 times per week 3 hours after taking medication    [provider]  Calcium  Carbonate-Vitamin D  (CALCIUM  600+D PO) Take 1 tablet by mouth daily. Taking calcium  + D3    [provider]  carvedilol  (COREG ) 6.25 MG tablet TAKE 1 TABLET BY MOUTH TWICE A DAY WITH FOOD Patient not taking: Reported on 09/17/2023 07/05/23   [name], [name]  cholecalciferol (VITAMIN D ) 1000 UNITS tablet Take 5,000 Units by mouth daily.  [provider]  enzalutamide  (XTANDI ) 40 MG tablet TAKE 4 TABLETS (160 MG TOTAL) BY MOUTH DAILY. 09/17/23 09/16/24  Lamon Pleasant HERO, PA-C  glucose blood test strip 1 each by Other route as needed for other. Use as instructed to check blood sugars    [provider]  Lancets (ONETOUCH DELICA PLUS LANCET33G) MISC by Does not apply route. Use as directed to check blood  Sugars 2 times per day dx E11.65    [provider]  Multiple Vitamin (MULTIVITAMIN WITH MINERALS) TABS Take 1 tablet by mouth daily.    [provider]  NON FORMULARY Take 1 tablet by mouth daily. Thyroid   action    [provider]  Olmesartan -amLODIPine -HCTZ 40-10-25 MG TABS TAKE 1 TABLET BY MOUTH EVERY DAY 04/19/23   [name], [name]  Omega-3 Fatty Acids (FISH OIL PO) Take 1 tablet by mouth once.    [provider]  pioglitazone -metformin  (ACTOPLUS MET ) 15-850 MG tablet Take 1 tablet by mouth 2 (two) times daily. 04/19/23   [name], [name]  rosuvastatin  (CRESTOR ) 10 MG tablet TAKE 1 TABLET BY MOUTH EVERY DAY 07/16/23   [name], [name]  Semaglutide ,0.25 or 0.5MG /DOS, (OZEMPIC , 0.25 OR 0.5 MG/DOSE,) 2 MG/3ML SOPN INJECT 0.25 MG WEEKLY FOR 4 WEEKS, THEN INCREASE TO 0.5 MG WEEKLY 07/02/23   [name], [name]  spironolactone  (ALDACTONE ) 25 MG tablet TAKE 1 TABLET (25 MG TOTAL) BY MOUTH DAILY. 07/05/23   [name],  [name]     Critical care time: 

## 2023-09-17 NOTE — ED Notes (Signed)
[facility] has been called at this time.

## 2023-09-17 NOTE — Consult Note (Signed)
 Cardiology Consultation   Patient ID: [name] MRN: [id number]; DOB: 07/22/1950  Admit date: 09/17/2023 Date of Consult: 09/17/2023  PCP:  Kyle Speaks, [name]   [facility] Providers Cardiologist:  None   { Click here to update MD or APP on Care Team, Refresh:1}     Patient Profile: Kyle George is a 73 y.o. male with a hx of *** who is being seen 09/17/2023 for the evaluation of *** at the request of ***.  History of Present Illness: Kyle George ***   Past Medical History:  Diagnosis Date   Cancer of prostate (HCC)    Diabetes mellitus without complication (HCC)    Hyperlipidemia    Hypertension     Past Surgical History:  Procedure Laterality Date   COLONOSCOPY     HEMORRHOID SURGERY       {Home Medications (Optional):21181}  Scheduled Meds:  acetaminophen   650 mg Oral Q4H   Or   acetaminophen  (TYLENOL ) oral liquid 160 mg/5 mL  650 mg Per Tube Q4H   Or   acetaminophen   650 mg Rectal Q4H   [START ON 09/18/2023] Chlorhexidine  Gluconate Cloth  6 each Topical Daily   docusate  100 mg Per Tube BID   famotidine   20 mg Per Tube BID   heparin   5,000 Units Subcutaneous Q8H   [START ON 09/18/2023] mouth rinse  15 mL Mouth Rinse Q2H   [START ON 09/18/2023] polyethylene glycol  17 g Per Tube Daily   Continuous Infusions:  sodium chloride      amiodarone  Stopped (09/17/23 1924)   Followed by   NOREEN ON 09/18/2023] amiodarone      epinephrine  10 mcg/min (09/17/23 2020)   fentaNYL  infusion INTRAVENOUS 25 mcg/hr (09/17/23 2235)   lidocaine  1 mg/min (09/17/23 1923)   magnesium  sulfate     norepinephrine  (LEVOPHED ) Adult infusion     propofol  (DIPRIVAN ) infusion 30 mcg/kg/min (09/17/23 2317)   PRN Meds: [START ON 09/19/2023] acetaminophen  **OR** [START ON 09/19/2023] acetaminophen  (TYLENOL ) oral liquid 160 mg/5 mL **OR** [START ON 09/19/2023] acetaminophen , busPIRone  **OR** busPIRone , fentaNYL , fentaNYL  (SUBLIMAZE ) injection, magnesium  sulfate, midazolam , ondansetron   (ZOFRAN ) IV, mouth rinse  Allergies:   No Known Allergies  Social History:   Social History   Socioeconomic History   Marital status: Married    Spouse name: Not on file   Number of children: Not on file   Years of education: Not on file   Highest education level: Not on file  Occupational History   Not on file  Tobacco Use   Smoking status: Some Days    Current packs/day: 0.25    Average packs/day: 0.3 packs/day for 50.0 years (12.5 ttl pk-yrs)    Types: Cigarettes   Smokeless tobacco: Former    Types: Chew    Quit date: 1975   Tobacco comments:    smoked about 1/2 PPD for about 10 years; now at 4-5 cigarettes per day   Vaping Use   Vaping status: Never Used  Substance and Sexual Activity   Alcohol use: Yes    Comment: occassionally   Drug use: Never   Sexual activity: Yes  Other Topics Concern   Not on file  Social History Narrative   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   Social Drivers of Corporate investment banker Strain: Low Risk  (08/29/2023)   Overall Financial Resource Strain (CARDIA)    Difficulty of Paying Living Expenses: Not hard at all  Food Insecurity: No  Food Insecurity (08/29/2023)   Hunger Vital Sign    Worried About Running Out of Food in the Last Year: Never true    Ran Out of Food in the Last Year: Never true  Transportation Needs: No Transportation Needs (08/29/2023)   PRAPARE - Administrator, Civil Service (Medical): No    Lack of Transportation (Non-Medical): No  Physical Activity: Sufficiently Active (08/29/2023)   Exercise Vital Sign    Days of Exercise per Week: 7 days    Minutes of Exercise per Session: 90 min  Stress: No Stress Concern Present (08/29/2023)   [employer] of Occupational Health - Occupational Stress Questionnaire    Feeling of Stress: Not at all  Social Connections: Moderately Integrated (08/29/2023)   Social Connection and Isolation Panel    Frequency of Communication with  Friends and Family: More than three times a week    Frequency of Social Gatherings with Friends and Family: Once a week    Attends Religious Services: 1 to 4 times per year    Active Member of [employer] or Organizations: No    Attends Banker Meetings: Never    Marital Status: Married  Catering manager Violence: Not At Risk (08/29/2023)   Humiliation, Afraid, Rape, and Kick questionnaire    Fear of Current or Ex-Partner: No    Emotionally Abused: No    Physically Abused: No    Sexually Abused: No    Family History:   *** Family History  Problem Relation Age of Onset   Hypertension Mother    Diabetes Mother    Cancer Father        He is unsure of what type of cancer but it was not prostate cancer   Cancer Paternal Uncle        Pt is unsure of what type of cancer   Colon cancer Neg Hx    Esophageal cancer Neg Hx    Stomach cancer Neg Hx    Rectal cancer Neg Hx      ROS:  Please see the history of present illness.  *** All other ROS reviewed and negative.     Physical Exam/Data: Vitals:   09/17/23 2130 09/17/23 2137 09/17/23 2145 09/17/23 2200  BP: (!) 148/95   (!) 155/76  Pulse:  (!) 42 (!) 35 (!) 41  Resp: 12 13 17  (!) 23  Temp:   98.8 F (37.1 C)   TempSrc:   Oral   SpO2:  91% 94% 97%  Weight:   72.9 kg   Height:        Intake/Output Summary (Last 24 hours) at 09/17/2023 2327 Last data filed at 09/17/2023 1924 Gross per 24 hour  Intake 518.28 ml  Output --  Net 518.28 ml      09/17/2023    9:45 PM 09/17/2023    4:01 PM 09/17/2023    2:09 PM  Last 3 Weights  Weight (lbs) 160 lb 11.5 oz 165 lb 5.5 oz 165 lb 12.6 oz  Weight (kg) 72.9 kg 75 kg 75.2 kg     Body mass index is 25.17 kg/m.  General:  Well nourished, well developed, in no acute distress*** HEENT: normal Neck: no JVD Vascular: No carotid bruits; Distal pulses 2+ bilaterally Cardiac:  normal S1, S2; RRR; no murmur *** Lungs:  clear to auscultation bilaterally, no wheezing, rhonchi or rales   Abd: soft, nontender, no hepatomegaly  Ext: no edema Musculoskeletal:  No deformities, BUE and BLE strength normal and equal  Skin: warm and dry  Neuro:  CNs 2-12 intact, no focal abnormalities noted Psych:  Normal affect   EKG:  The EKG was personally reviewed and demonstrates:  *** Telemetry:  Telemetry was personally reviewed and demonstrates:  ***  Relevant CV Studies: ***  Laboratory Data: High Sensitivity Troponin:   Recent Labs  Lab 09/17/23 1814  TROPONINIHS 9     Chemistry Recent Labs  Lab 09/17/23 1327 09/17/23 1814 09/17/23 2215  NA 141 139 138  K 3.9 3.8 3.3*  CL 104 104  --   CO2 23 23  --   GLUCOSE 70 74  --   BUN 19 17  --   CREATININE 1.02 0.93  --   CALCIUM  8.9 8.6*  --   MG  --  1.9  --   GFRNONAA >60 >60  --   ANIONGAP 14 12  --     Recent Labs  Lab 09/17/23 1327 09/17/23 1814  PROT 7.2 6.6  ALBUMIN 3.5 3.2*  AST 17 15  ALT 10 10  ALKPHOS 48 43  BILITOT 0.6 0.6   Lipids No results for input(s): CHOL, TRIG, HDL, LABVLDL, LDLCALC, CHOLHDL in the last 168 hours.  Hematology Recent Labs  Lab 09/17/23 1327 09/17/23 1605 09/17/23 1814 09/17/23 2215  WBC 6.9 PENDING 6.7  --   RBC 3.96* 3.96* 3.78*  --   HGB 14.1 14.1 13.4 12.9*  HCT 42.4 42.8 40.3 38.0*  MCV 107.1* 108.1* 106.6*  --   MCH 35.6* 35.6* 35.4*  --   MCHC 33.3 32.9 33.3  --   RDW 15.2 15.3 15.2  --   PLT 202 188 186  --    Thyroid  No results for input(s): TSH, FREET4 in the last 168 hours.  BNPNo results for input(s): BNP, PROBNP in the last 168 hours.  DDimer No results for input(s): DDIMER in the last 168 hours.  Radiology/Studies:  DG CHEST PORT 1 VIEW Result Date: 09/17/2023 CLINICAL DATA:  Intubated EXAM: PORTABLE CHEST 1 VIEW COMPARISON:  09/17/2023, CT 03/24/2020 FINDINGS: Endotracheal tube tip is about 3.3 cm superior to carina. Enteric tube tip below the diaphragm but incompletely assessed. Emphysema. Borderline to mild cardiomegaly with  aortic atherosclerosis. Interstitial and ground-glass opacity most evident in the right upper lobe. Vague more focal right upper lobe opacity IMPRESSION: 1. Endotracheal tube tip about 3.3 cm superior to carina. 2. Emphysema. Interstitial and ground-glass opacity most evident in the right upper lobe, possible pneumonia. Vague more focal right upper lobe opacity, indeterminate for focal pneumonia, nodule, or sclerotic bone lesion (history of metastatic disease). Chest CT follow-up could be obtained for further evaluation Electronically Signed   By: Luke Bun M.D.   On: 09/17/2023 22:52   DG Chest Port 1 View Result Date: 09/17/2023 CLINICAL DATA:  Intubated, cardiac arrest, history of prostate cancer EXAM: PORTABLE CHEST 1 VIEW COMPARISON:  03/24/2020 FINDINGS: Two frontal views of the chest demonstrate an endotracheal tube overlying tracheal air column, tip approximately 4 cm above carina. Enteric catheter passes below diaphragm, tip projecting over the gastric body. External defibrillator pads are noted. The cardiac silhouette is unremarkable. Diffuse interstitial prominence with bilateral ground-glass airspace disease, greatest in the right upper lobe, compatible with edema. No effusion or pneumothorax. No acute displaced fractures. IMPRESSION: 1. Support devices as above. 2. Interstitial and ground-glass opacities, most pronounced in the right upper lobe, favor asymmetric edema. Electronically Signed   By: Ozell Daring M.D.   On: 09/17/2023 19:30  Assessment and Plan: ***   Risk Assessment/Risk Scores: {Complete the following score calculators/questions to meet required metrics.  Press F2         :[id number]}   {Is the patient being seen for unstable angina, ACS, NSTEMI or STEMI?:[phone]} {Does this patient have CHF or CHF symptoms?      :[id number]} {Does this patient have ATRIAL FIBRILLATION?:[phone]}  {Are we signing off today?:[id number]}  For questions or updates, please contact  [facility] Please consult [email or web address] for contact info under    Signed, Jerrell DELENA Orchard, MD  09/17/2023 11:27 PM

## 2023-09-17 NOTE — Progress Notes (Signed)
 Specialty Pharmacy Refill Coordination Note  Kyle George is a 73 y.o. male contacted today regarding refills of specialty medication(s) Enzalutamide  (XTANDI )   Patient requested Delivery   Delivery date: 09/20/23   Verified address: [address] [place]   Medication will be filled on 09/19/23.

## 2023-09-17 NOTE — ED Triage Notes (Signed)
 Pt was being seen in cancer center for injections and when taking vitals pt's heart rate was in 30s. Pt has no complaints but states pcp took him off meds recently for the same.

## 2023-09-17 NOTE — ED Notes (Signed)
 Secretary requested someone to check on pt, reference abnormal cardiac rhythm. Upon entering room, patient did not look up or respond. No breathing noted. Pt not responsive to painful stimuli. No carotid pulse palpated. V-tach noted on monitor. Code blue activated and chest compressions initiated. Patient moved to larger room to continue ACLS interventions and allow more staff to access patient.

## 2023-09-18 ENCOUNTER — Inpatient Hospital Stay (HOSPITAL_COMMUNITY)

## 2023-09-18 ENCOUNTER — Encounter (HOSPITAL_COMMUNITY)
Admission: EM | Disposition: A | Discharge status: Home / Self Care | Payer: Self-pay | Source: Ambulatory Visit | Attending: Internal Medicine

## 2023-09-18 ENCOUNTER — Encounter (HOSPITAL_COMMUNITY): Payer: Self-pay | Admitting: Critical Care Medicine

## 2023-09-18 ENCOUNTER — Other Ambulatory Visit: Payer: Self-pay

## 2023-09-18 ENCOUNTER — Ambulatory Visit

## 2023-09-18 DIAGNOSIS — R9431 Abnormal electrocardiogram [ECG] [EKG]: Secondary | ICD-10-CM

## 2023-09-18 DIAGNOSIS — E8729 Other acidosis: Secondary | ICD-10-CM | POA: Diagnosis not present

## 2023-09-18 DIAGNOSIS — N179 Acute kidney failure, unspecified: Secondary | ICD-10-CM

## 2023-09-18 DIAGNOSIS — I469 Cardiac arrest, cause unspecified: Secondary | ICD-10-CM | POA: Diagnosis not present

## 2023-09-18 DIAGNOSIS — I442 Atrioventricular block, complete: Principal | ICD-10-CM

## 2023-09-18 DIAGNOSIS — R569 Unspecified convulsions: Secondary | ICD-10-CM | POA: Diagnosis not present

## 2023-09-18 DIAGNOSIS — R918 Other nonspecific abnormal finding of lung field: Secondary | ICD-10-CM | POA: Diagnosis not present

## 2023-09-18 DIAGNOSIS — E876 Hypokalemia: Secondary | ICD-10-CM

## 2023-09-18 DIAGNOSIS — Z4682 Encounter for fitting and adjustment of non-vascular catheter: Secondary | ICD-10-CM | POA: Diagnosis not present

## 2023-09-18 DIAGNOSIS — Z452 Encounter for adjustment and management of vascular access device: Secondary | ICD-10-CM | POA: Diagnosis not present

## 2023-09-18 DIAGNOSIS — J9601 Acute respiratory failure with hypoxia: Secondary | ICD-10-CM | POA: Diagnosis not present

## 2023-09-18 DIAGNOSIS — I700 Atherosclerosis of aorta: Secondary | ICD-10-CM | POA: Diagnosis not present

## 2023-09-18 HISTORY — PX: TEMPORARY PACEMAKER: CATH118268

## 2023-09-18 LAB — LACTIC ACID, PLASMA
Lactic Acid, Venous: 7.9 mmol/L (ref 0.5–1.9)
Lactic Acid, Venous: 8.1 mmol/L (ref 0.5–1.9)
Lactic Acid, Venous: 5.9 mmol/L (ref 0.5–1.9)
Lactic Acid, Venous: 2.3 mmol/L (ref 0.5–1.9)
Lactic Acid, Venous: 1.3 mmol/L (ref 0.5–1.9)

## 2023-09-18 LAB — BASIC METABOLIC PANEL WITH GFR
Anion gap: 18 — ABNORMAL HIGH (ref 5–15)
Anion gap: 17 — ABNORMAL HIGH (ref 5–15)
BUN: 24 mg/dL — ABNORMAL HIGH (ref 8–23)
BUN: 26 mg/dL — ABNORMAL HIGH (ref 8–23)
CO2: 17 mmol/L — ABNORMAL LOW (ref 22–32)
CO2: 17 mmol/L — ABNORMAL LOW (ref 22–32)
Calcium: 8.7 mg/dL — ABNORMAL LOW (ref 8.9–10.3)
Calcium: 8.5 mg/dL — ABNORMAL LOW (ref 8.9–10.3)
Chloride: 104 mmol/L (ref 98–111)
Chloride: 104 mmol/L (ref 98–111)
Creatinine, Ser: 1.71 mg/dL — ABNORMAL HIGH (ref 0.61–1.24)
Creatinine, Ser: 1.69 mg/dL — ABNORMAL HIGH (ref 0.61–1.24)
GFR, Estimated: 41 mL/min — ABNORMAL LOW (ref >60)
GFR, Estimated: 42 mL/min — ABNORMAL LOW (ref >60)
Glucose, Bld: 339 mg/dL — ABNORMAL HIGH (ref 70–99)
Glucose, Bld: 344 mg/dL — ABNORMAL HIGH (ref 70–99)
Potassium: 3.3 mmol/L — ABNORMAL LOW (ref 3.5–5.1)
Potassium: 3.2 mmol/L — ABNORMAL LOW (ref 3.5–5.1)
Sodium: 139 mmol/L (ref 135–145)
Sodium: 138 mmol/L (ref 135–145)

## 2023-09-18 LAB — COOXEMETRY PANEL
Carboxyhemoglobin: 2.2 % — ABNORMAL HIGH (ref 0.5–1.5)
Methemoglobin: <0.7 % (ref 0.0–1.5)
O2 Saturation: 86.1 %
Total hemoglobin: 11.7 g/dL — ABNORMAL LOW (ref 12.0–16.0)

## 2023-09-18 LAB — CBC
HCT: 39.6 % (ref 39.0–52.0)
Hemoglobin: 12.5 g/dL — ABNORMAL LOW (ref 13.0–17.0)
MCH: 34.1 pg — ABNORMAL HIGH (ref 26.0–34.0)
MCHC: 31.6 g/dL (ref 30.0–36.0)
MCV: 107.9 fL — ABNORMAL HIGH (ref 80.0–100.0)
Platelets: 219 K/uL (ref 150–400)
RBC: 3.67 MIL/uL — ABNORMAL LOW (ref 4.22–5.81)
RDW: 15.1 % (ref 11.5–15.5)
WBC: 19.7 K/uL — ABNORMAL HIGH (ref 4.0–10.5)
nRBC: 0 % (ref 0.0–0.2)

## 2023-09-18 LAB — CBC WITH DIFFERENTIAL/PLATELET
Hemoglobin: 42.8 g/dL (ref 39.0–52.0)
MCHC: 35.6 pg — AB (ref 30.0–36.0)
MCV: 108.1 fL — AB (ref 80.0–52.0)
MCV: 108.1 pg — ABNORMAL HIGH (ref 80.0–34.0)
RBC: 14.1 g/dL — AB (ref 4.22–5.81)
RDW: 188 K/uL (ref 150–400)
RDW: 32.9 g/dL (ref 30.0–36.0)
WBC: 3.96 MIL/uL — AB (ref 4.22–5.81)

## 2023-09-18 LAB — HEPATIC FUNCTION PANEL
ALT: 659 U/L — ABNORMAL HIGH (ref 0–44)
AST: 869 U/L — ABNORMAL HIGH (ref 15–41)
Albumin: 2.8 g/dL — ABNORMAL LOW (ref 3.5–5.0)
Alkaline Phosphatase: 50 U/L (ref 38–126)
Bilirubin, Direct: 0.2 mg/dL (ref 0.0–0.2)
Indirect Bilirubin: 0.4 mg/dL (ref 0.3–0.9)
Total Bilirubin: 0.6 mg/dL (ref 0.0–1.2)
Total Protein: 6 g/dL — ABNORMAL LOW (ref 6.5–8.1)

## 2023-09-18 LAB — GLUCOSE, CAPILLARY
Glucose-Capillary: 334 mg/dL — ABNORMAL HIGH (ref 70–99)
Glucose-Capillary: 357 mg/dL — ABNORMAL HIGH (ref 70–99)
Glucose-Capillary: 284 mg/dL — ABNORMAL HIGH (ref 70–99)
Glucose-Capillary: 112 mg/dL — ABNORMAL HIGH (ref 70–99)
Glucose-Capillary: 119 mg/dL — ABNORMAL HIGH (ref 70–99)
Glucose-Capillary: 98 mg/dL (ref 70–99)
Glucose-Capillary: 80 mg/dL (ref 70–99)
Glucose-Capillary: 90 mg/dL (ref 70–99)

## 2023-09-18 LAB — RAPID URINE DRUG SCREEN, HOSP PERFORMED
Amphetamines: NOT DETECTED
Barbiturates: NOT DETECTED
Benzodiazepines: POSITIVE — AB
Cocaine: NOT DETECTED
Opiates: NOT DETECTED
Tetrahydrocannabinol: NOT DETECTED

## 2023-09-18 LAB — ECHOCARDIOGRAM COMPLETE
Area-P 1/2: 5.07 cm2
Calc EF: 57.1 %
Height: 67 in
S' Lateral: 2.38 cm
Single Plane A2C EF: 53.4 %
Single Plane A4C EF: 58 %
Weight: 2571.45 [oz_av]

## 2023-09-18 LAB — TRIGLYCERIDES: Triglycerides: 68 mg/dL (ref <150)

## 2023-09-18 LAB — MAGNESIUM: Magnesium: 1.6 mg/dL — ABNORMAL LOW (ref 1.7–2.4)

## 2023-09-18 LAB — MRSA NEXT GEN BY PCR, NASAL: MRSA by PCR Next Gen: NOT DETECTED

## 2023-09-18 LAB — TSH: TSH: 2.015 u[IU]/mL (ref 0.350–4.500)

## 2023-09-18 SURGERY — TEMPORARY PACEMAKER
Anesthesia: LOCAL

## 2023-09-18 MED ORDER — LIDOCAINE 5 % EX PTCH
1.0000 | MEDICATED_PATCH | CUTANEOUS | Status: DC
  Filled 2023-09-18: qty 1

## 2023-09-18 MED ORDER — MAGNESIUM SULFATE 2 GM/50ML IV SOLN
2.0000 g | Freq: Once | INTRAVENOUS | Status: AC
  Administered 2023-09-18: 2 g via INTRAVENOUS
  Filled 2023-09-18: qty 50

## 2023-09-18 MED ORDER — INSULIN ASPART 100 UNIT/ML IJ SOLN
0.0000 [IU] | INTRAMUSCULAR | Status: DC
  Administered 2023-09-18: 9 [IU] via SUBCUTANEOUS

## 2023-09-18 MED ORDER — ORAL CARE MOUTH RINSE: 15.0000 mL | OROMUCOSAL | Status: DC | PRN

## 2023-09-18 MED ORDER — INSULIN ASPART 100 UNIT/ML IJ SOLN: 0.0000 [IU] | INTRAMUSCULAR | Status: DC

## 2023-09-18 MED ORDER — POTASSIUM CHLORIDE 10 MEQ/100ML IV SOLN
10.0000 meq | INTRAVENOUS | Status: AC
Start: 2023-09-18 — End: 2023-09-18
  Administered 2023-09-18 (×3): 10 meq via INTRAVENOUS
  Filled 2023-09-18 (×3): qty 100

## 2023-09-18 MED ORDER — POTASSIUM CHLORIDE 20 MEQ PO PACK: 20.0000 meq | PACK | Freq: Once | ORAL | Status: DC

## 2023-09-18 MED ORDER — DEXTROSE 50 % IV SOLN
INTRAVENOUS | Status: AC
  Administered 2023-09-18: 12.5 g via INTRAVENOUS
  Filled 2023-09-18: qty 50

## 2023-09-18 MED ORDER — FAMOTIDINE 20 MG PO TABS: 20.0000 mg | ORAL_TABLET | Freq: Every day | ORAL | Status: DC

## 2023-09-18 MED ORDER — POLYETHYLENE GLYCOL 3350 17 G PO PACK: 17.0000 g | PACK | Freq: Every day | ORAL | Status: DC

## 2023-09-18 MED ORDER — HEPARIN (PORCINE) IN NACL 1000-0.9 UT/500ML-% IV SOLN
INTRAVENOUS | Status: DC | PRN
  Administered 2023-09-18: 500 mL

## 2023-09-18 MED ORDER — LIDOCAINE 5 % EX PTCH
1.0000 | MEDICATED_PATCH | CUTANEOUS | Status: DC
  Administered 2023-09-18 – 2023-09-21 (×3): 1 via TRANSDERMAL
  Filled 2023-09-18 (×3): qty 1

## 2023-09-18 MED ORDER — SODIUM CHLORIDE 0.9% FLUSH: 10.0000 mL | INTRAVENOUS | Status: DC | PRN

## 2023-09-18 MED ORDER — LIDOCAINE HCL (PF) 1 % IJ SOLN
INTRAMUSCULAR | Status: DC | PRN
  Administered 2023-09-18: 5 mL

## 2023-09-18 MED ORDER — ARFORMOTEROL TARTRATE 15 MCG/2ML IN NEBU
15.0000 ug | INHALATION_SOLUTION | Freq: Two times a day (BID) | RESPIRATORY_TRACT | Status: DC
  Administered 2023-09-18 – 2023-09-22 (×9): 15 ug via RESPIRATORY_TRACT
  Filled 2023-09-18 (×9): qty 2

## 2023-09-18 MED ORDER — BUDESONIDE 0.25 MG/2ML IN SUSP
0.2500 mg | Freq: Two times a day (BID) | RESPIRATORY_TRACT | Status: DC
  Administered 2023-09-18 – 2023-09-22 (×9): 0.25 mg via RESPIRATORY_TRACT
  Filled 2023-09-18 (×9): qty 2

## 2023-09-18 MED ORDER — LIDOCAINE HCL (PF) 1 % IJ SOLN
INTRAMUSCULAR | Status: AC
  Filled 2023-09-18: qty 30

## 2023-09-18 MED ORDER — DEXTROSE 50 % IV SOLN: 12.5000 g | INTRAVENOUS | Status: AC

## 2023-09-18 MED ORDER — DOCUSATE SODIUM 100 MG PO CAPS
100.0000 mg | ORAL_CAPSULE | Freq: Two times a day (BID) | ORAL | Status: DC
  Administered 2023-09-18: 100 mg via ORAL
  Filled 2023-09-18: qty 1

## 2023-09-18 MED ORDER — CALCIUM GLUCONATE-NACL 2-0.675 GM/100ML-% IV SOLN
2.0000 g | Freq: Once | INTRAVENOUS | Status: AC
  Administered 2023-09-18: 2000 mg via INTRAVENOUS
  Filled 2023-09-18: qty 100

## 2023-09-18 MED ORDER — SODIUM CHLORIDE 0.9 % IV SOLN
INTRAVENOUS | Status: AC | PRN
  Administered 2023-09-18: 10 mL/h via INTRAVENOUS

## 2023-09-18 MED ORDER — SODIUM CHLORIDE 0.9 % IV SOLN: INTRAVENOUS | Status: AC | PRN

## 2023-09-18 MED ORDER — SODIUM CHLORIDE 0.9 % IV SOLN
3.0000 g | Freq: Three times a day (TID) | INTRAVENOUS | Status: DC
  Administered 2023-09-18 – 2023-09-19 (×3): 3 g via INTRAVENOUS
  Filled 2023-09-18 (×3): qty 8

## 2023-09-18 MED ORDER — INFLUENZA VAC SPLIT HIGH-DOSE 0.5 ML IM SUSY: 0.5000 mL | PREFILLED_SYRINGE | INTRAMUSCULAR | Status: DC | PRN

## 2023-09-18 MED ORDER — POTASSIUM CHLORIDE 20 MEQ PO PACK
40.0000 meq | PACK | ORAL | Status: AC
  Administered 2023-09-18: 40 meq
  Filled 2023-09-18: qty 2

## 2023-09-18 MED ORDER — POTASSIUM CHLORIDE 10 MEQ/100ML IV SOLN
INTRAVENOUS | Status: AC
  Administered 2023-09-18: 10 meq via INTRAVENOUS
  Filled 2023-09-18: qty 100

## 2023-09-18 MED ORDER — ISOPROTERENOL HCL 0.2 MG/ML IJ SOLN
2.0000 ug/min | INTRAVENOUS | Status: DC
  Administered 2023-09-18: 2 ug/min via INTRAVENOUS
  Administered 2023-09-18: 13 ug/min via INTRAVENOUS
  Administered 2023-09-18: 11 ug/min via INTRAVENOUS
  Filled 2023-09-18 (×9): qty 5

## 2023-09-18 MED ORDER — OXYCODONE HCL 5 MG PO TABS
5.0000 mg | ORAL_TABLET | ORAL | Status: DC | PRN
  Administered 2023-09-18 – 2023-09-19 (×3): 5 mg via ORAL
  Filled 2023-09-18 (×3): qty 1

## 2023-09-18 SURGICAL SUPPLY — 7 items
CABLE ADAPT CONN TEMP 6FT (ADAPTER) IMPLANT
COVER PRB 48X5XTLSCP FOLD TPE (BAG) IMPLANT
MAT PREVALON FULL STRYKER (MISCELLANEOUS) IMPLANT
PACK CARDIAC CATHETERIZATION (CUSTOM PROCEDURE TRAY) IMPLANT
SHEATH PINNACLE 6F 10CM (SHEATH) IMPLANT
SHIELD CATHGARD ARROW (MISCELLANEOUS) IMPLANT
WIRE PACING TEMP ST TIP 5 (CATHETERS) IMPLANT

## 2023-09-18 NOTE — TOC Initial Note (Signed)
 Transition of Care [facility]) - Initial/Assessment Note    Patient Details  Name: Kyle George MRN: [id number] Date of Birth: 12/26/1950  [facility] CM/SW Contact:    Justina Delcia Czar, RN Phone Number: [phone] 09/18/2023, 5:13 PM  Clinical Narrative:                 Inpatient CM spoke to pt and wife at bedside. Gave permission to speak to wife. Wife states pt is independent at home. Drives to appts. Pt does not have any DME or hx of HH in the past. Will continue to follow for dc needs.   Expected Discharge Plan: Home/Self Care Barriers to Discharge: Continued Medical Work up   Patient Goals and CMS Choice Patient states their goals for this hospitalization and ongoing recovery are:: wants to recover          Expected Discharge Plan and Services   Discharge Planning Services: CM Consult   Living arrangements for the past 2 months: Single Family Home                                      Prior Living Arrangements/Services Living arrangements for the past 2 months: Single Family Home Lives with:: Spouse, Adult Children Patient language and need for interpreter reviewed:: Yes Do you feel safe going back to the place where you live?: Yes      Need for Family Participation in Patient Care: No (Comment) Care giver support system in place?: Yes (comment)   Criminal Activity/Legal Involvement Pertinent to Current Situation/Hospitalization: No - Comment as needed  Activities of Daily Living   ADL Screening (condition at time of admission) Independently performs ADLs?: Yes (appropriate for developmental age) Is the patient deaf or have difficulty hearing?: No Does the patient have difficulty seeing, even when wearing glasses/contacts?: No Does the patient have difficulty concentrating, remembering, or making decisions?: No  Permission Sought/Granted Permission sought to share information with : Case Manager, Family Supports, PCP Permission granted  to share information with : Yes, Verbal Permission Granted  Share Information with NAME: Kyle George  Permission granted to share info w AGENCY: Home Health, PCP, DME  Permission granted to share info w Relationship: wife  Permission granted to share info w Contact Information: [phone]  Emotional Assessment Appearance:: Appears stated age Attitude/Demeanor/Rapport: Engaged Affect (typically observed): Accepting Orientation: : Oriented to Self, Oriented to Place, Oriented to  Time, Oriented to Situation   Psych Involvement: No (comment)  Admission diagnosis:  Cardiac arrest [facility]) [I46.9] Patient Active Problem List   Diagnosis Date Noted   Cardiac arrest (HCC) 09/17/2023   Encounter for general adult medical examination w/o abnormal findings 04/09/2023   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 [place] [address] Phone: [phone] Fax: [phone]  [place] [facility] [address] [place] [address] Phone: [phone] Fax: [phone]  [place] [facility] [address] [place] [address] Phone: [phone] Fax: [phone]     Social Drivers of Health (SDOH) Social History: SDOH Screenings   Food Insecurity: No Food Insecurity (09/18/2023)  Housing: Low Risk  (09/18/2023)  Transportation Needs: No Transportation Needs (09/18/2023)  Utilities: Not  At Risk (09/18/2023)  Alcohol Screen: Low Risk  (08/29/2023)  Depression (PHQ2-9): Low Risk  (09/17/2023)  Financial Resource Strain: Low Risk  (08/29/2023)  Physical Activity: Sufficiently Active (08/29/2023)  Social Connections: Moderately Integrated (09/18/2023)  Stress: No Stress Concern Present (08/29/2023)  Tobacco Use: High Risk (09/18/2023)  Health Literacy: Adequate Health Literacy (08/29/2023)   SDOH Interventions:     Readmission Risk Interventions     No data to display

## 2023-09-18 NOTE — Interval H&P Note (Signed)
 History and Physical Interval Note:  09/18/2023 8:18 AM  Kyle George  has presented today for surgery, with the diagnosis of heart block.  The various methods of treatment have been discussed with the patient and family. After consideration of risks, benefits and other options for treatment, the patient has consented to  Procedure(s): TEMPORARY PACEMAKER (N/A) as a surgical intervention.  The patient's history has been reviewed, patient examined, no change in status, stable for surgery.  I have reviewed the patient's chart and labs.  Questions were answered to the patient's satisfaction.     [name]

## 2023-09-18 NOTE — Procedures (Signed)
 Central Venous Catheter Insertion Procedure Note  Kyle George  Kyle George  Kyle George  Date:09/18/23  Time:7:02 AM   Provider Performing:Kyle George   Procedure: Insertion of Non-tunneled Central Venous Catheter(36556) with US  guidance (23062)   Indication(s) Medication administration  Consent Risks of the procedure as well as the alternatives and risks of each were explained to the patient and/or caregiver.  Consent for the procedure was obtained and is signed in the bedside chart  Anesthesia Topical only with 1% lidocaine    Timeout Verified patient identification, verified procedure, site/side was marked, verified correct patient position, special equipment/implants available, medications/allergies/relevant history reviewed, required imaging and test results available.  Sterile Technique Maximal sterile technique including full sterile barrier drape, hand hygiene, sterile gown, sterile gloves, mask, hair covering, sterile ultrasound probe cover (if used).  Procedure Description Area of catheter insertion was cleaned with chlorhexidine  and draped in sterile fashion.  With real-time ultrasound guidance a central venous catheter was placed into the left internal jugular vein. Nonpulsatile blood flow and easy flushing noted in all ports.  The catheter was sutured in place and sterile dressing applied.    Complications/Tolerance None; patient tolerated the procedure well. Chest X-ray is ordered to verify placement for internal jugular or subclavian cannulation.   Chest x-ray is not ordered for femoral cannulation.  EBL Minimal  Specimen(s) None  Kyle CHRISTELLA Ilah, PA-C  Pulmonary & Critical Care 09/18/23 7:03 AM  Please see [email or web address] for pager details.  From 7A-7P if no response, please call [phone] After hours, please call ELink [phone]

## 2023-09-18 NOTE — Consult Note (Signed)
 Advanced Heart Failure Team Consult Note   Primary Physician: [name], [name] Cardiologist:  None  Reason for Consultation: Cardiac Arrest  HPI:    Kyle George is seen today for evaluation of cardiac arrest at the request of Dr. Orlando.   Kyle George is a 73 year old with history of metastatic prostate cancer, bone metastasis, HTN, and DM.   Per Oncology- Staging form: Bone - Appendicular Skeleton, Trunk, Skull, and Facial Bones   He was seen by his PCP 08/29/23-->. Heart rate was 35 on pulse ox. Rechecked manually and it was 46. Informed PCP and rechecked after visit. Hear rate was 44. PCP says she is putting in a referral for a cardiologist. Patient states that he feels fine.  Coreg  discontinued   On 9/8 he went to an outpatient oncology appointment and had a heart rate in the 30s. He was asymptomatic. Transported to [facility]. While in the ED had VF arrest.  Shock x4 with ROSC. Intubated and started on amiodarone  and lidocaine  drip. Transferred to [facility] . On arrival, heart rate 30-40s. 2:1 AV block. Started on Isuprel   CHB this morning on multiple drip. Norepi 10, Lidocaine  1, Epi 7, and Isuprel . Lactic acid 8>5.9 Creatinine trending up,  WBC 20K.    Home Medications Prior to Admission medications   Medication Sig Start Date End Date Taking? Authorizing Provider  aspirin 81 MG tablet Take 81 mg by mouth daily.   Yes [provider]  Calcium  Carbonate-Vitamin D  (CALCIUM  600+D PO) Take 1 tablet by mouth daily. Taking calcium  + D3   Yes [provider]  cholecalciferol (VITAMIN D ) 1000 UNITS tablet Take 5,000 Units by mouth daily.   Yes [provider]  enzalutamide  (XTANDI ) 40 MG tablet TAKE 4 TABLETS (160 MG TOTAL) BY MOUTH DAILY. Patient taking differently: Take 160 mg by mouth daily. 09/17/23 09/16/24 Yes Pennington, Pleasant HERO, PA-C  Olmesartan -amLODIPine -HCTZ 40-10-25 MG TABS TAKE 1 TABLET BY MOUTH EVERY DAY 04/19/23  Yes [name], [name]  Omega-3 Fatty  Acids (FISH OIL PO) Take 1 tablet by mouth daily.   Yes [provider]  pioglitazone -metformin  (ACTOPLUS MET ) 15-850 MG tablet Take 1 tablet by mouth 2 (two) times daily. 04/19/23  Yes [name], [name]  Semaglutide ,0.25 or 0.5MG /DOS, (OZEMPIC , 0.25 OR 0.5 MG/DOSE,) 2 MG/3ML SOPN INJECT 0.25 MG WEEKLY FOR 4 WEEKS, THEN INCREASE TO 0.5 MG WEEKLY Patient taking differently: Inject 0.5 mg into the skin every Wednesday. Inject 0.25 mg weekly for 4 weeks, then increase to 0.5 mg weekly 07/02/23  Yes [name], Janece, [name]  spironolactone  (ALDACTONE ) 25 MG tablet TAKE 1 TABLET (25 MG TOTAL) BY MOUTH DAILY. 07/05/23  Yes [name], [name]  rosuvastatin  (CRESTOR ) 10 MG tablet TAKE 1 TABLET BY MOUTH EVERY DAY 07/16/23   [name], [name]    Past Medical History: Past Medical History:  Diagnosis Date   Cancer of prostate (HCC)    Diabetes mellitus without complication (HCC)    Hyperlipidemia    Hypertension     Past Surgical History: Past Surgical History:  Procedure Laterality Date   COLONOSCOPY     HEMORRHOID SURGERY      Family History: Family History  Problem Relation Age of Onset   Hypertension Mother    Diabetes Mother    Cancer Father        He is unsure of what type of cancer but it was not prostate cancer   Cancer Paternal Uncle  Pt is unsure of what type of cancer   Colon cancer Neg Hx    Esophageal cancer Neg Hx    Stomach cancer Neg Hx    Rectal cancer Neg Hx     Social History: Social History   Socioeconomic History   Marital status: Married    Spouse name: Not on file   Number of children: Not on file   Years of education: Not on file   Highest education level: Not on file  Occupational History   Not on file  Tobacco Use   Smoking status: Some Days    Current packs/day: 0.25    Average packs/day: 0.3 packs/day for 50.0 years (12.5 ttl pk-yrs)    Types: Cigarettes   Smokeless tobacco: Former    Types: Chew    Quit date: 1975   Tobacco comments:     smoked about 1/2 PPD for about 10 years; now at 4-5 cigarettes per day   Vaping Use   Vaping status: Never Used  Substance and Sexual Activity   Alcohol use: Yes    Comment: occassionally   Drug use: Never   Sexual activity: Yes  Other Topics Concern   Not on file  Social History Narrative   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   Social Drivers of Corporate investment banker Strain: Low Risk  (08/29/2023)   Overall Financial Resource Strain (CARDIA)    Difficulty of Paying Living Expenses: Not hard at all  Food Insecurity: No Food Insecurity (09/18/2023)   Hunger Vital Sign    Worried About Running Out of Food in the Last Year: Never true    Ran Out of Food in the Last Year: Never true  Transportation Needs: No Transportation Needs (09/18/2023)   PRAPARE - Administrator, Civil Service (Medical): No    Lack of Transportation (Non-Medical): No  Physical Activity: Sufficiently Active (08/29/2023)   Exercise Vital Sign    Days of Exercise per Week: 7 days    Minutes of Exercise per Session: 90 min  Stress: No Stress Concern Present (08/29/2023)   [employer] of Occupational Health - Occupational Stress Questionnaire    Feeling of Stress: Not at all  Social Connections: Moderately Integrated (09/18/2023)   Social Connection and Isolation Panel    Frequency of Communication with Friends and Family: More than three times a week    Frequency of Social Gatherings with Friends and Family: Once a week    Attends Religious Services: 1 to 4 times per year    Active Member of Clubs or Organizations: No    Attends Banker Meetings: Never    Marital Status: Married    Allergies:  No Known Allergies  Objective:    Vital Signs:   Temp:  [96.6 F (35.9 C)-98.8 F (37.1 C)] 98.6 F (37 C) (09/09 0520) Pulse Rate:  [35-88] 50 (09/09 0520) Resp:  [12-29] 24 (09/09 0520) BP: (99-198)/(46-111) 118/54 (09/09 0515) SpO2:  [91 %-100 %]  100 % (09/09 0520) Arterial Line BP: (112-179)/(36-54) 118/39 (09/09 0520) FiO2 (%):  [60 %-100 %] 60 % (09/09 0401) Weight:  [72.9 kg-75 kg] 72.9 kg (09/08 2145) Last BM Date :  (PTA)  Weight change: Filed Weights   09/17/23 1601 09/17/23 2145  Weight: 75 kg 72.9 kg    Intake/Output:   Intake/Output Summary (Last 24 hours) at 09/18/2023 0706 Last data filed at 09/18/2023 0500 Gross per 24 hour  Intake 3793.06  ml  Output --  Net 3793.06 ml      Physical Exam    General:   Intubated. Appears critically ill on multple drips.  Neck: LIJ Cor: Regular rate & rhythm. Lungs: clear Abdomen: soft, nontender, nondistended.  Extremities: no  edema Neuro: Intubated/sedated    Telemetry   CHB   EKG      Labs   Basic Metabolic Panel: Recent Labs  Lab 09/17/23 1327 09/17/23 1814 09/17/23 2215 09/17/23 2316 09/18/23 0125 09/18/23 0323  NA 141 139 138  --  139 138  K 3.9 3.8 3.3*  --  3.3* 3.2*  CL 104 104  --   --  104 104  CO2 23 23  --   --  17* 17*  GLUCOSE 70 74  --   --  339* 344*  BUN 19 17  --   --  24* 26*  CREATININE 1.02 0.93  --  1.44* 1.71* 1.69*  CALCIUM  8.9 8.6*  --   --  8.7* 8.5*  MG  --  1.9  --   --  1.6*  --     Liver Function Tests: Recent Labs  Lab 09/17/23 1327 09/17/23 1814 09/18/23 0125  AST 17 15 869*  ALT 10 10 659*  ALKPHOS 48 43 50  BILITOT 0.6 0.6 0.6  PROT 7.2 6.6 6.0*  ALBUMIN 3.5 3.2* 2.8*   No results for input(s): LIPASE, AMYLASE in the last 168 hours. No results for input(s): AMMONIA in the last 168 hours.  CBC: Recent Labs  Lab 09/17/23 1327 09/17/23 1605 09/17/23 1814 09/17/23 2215 09/17/23 2316 09/18/23 0125  WBC 6.9 PENDING 6.7  --  20.7* 19.7*  NEUTROABS 4.6 PENDING 4.6  --   --   --   HGB 14.1 14.1 13.4 12.9* 12.8* 12.5*  HCT 42.4 42.8 40.3 38.0* 40.0 39.6  MCV 107.1* 108.1* 106.6*  --  109.6* 107.9*  PLT 202 188 186  --  231 219    Cardiac Enzymes: No results for input(s): CKTOTAL, CKMB,  CKMBINDEX, TROPONINI in the last 168 hours.  BNP: BNP (last 3 results) No results for input(s): BNP in the last 8760 hours.  ProBNP (last 3 results) No results for input(s): PROBNP in the last 8760 hours.   CBG: Recent Labs  Lab 09/17/23 1837 09/17/23 1854 09/17/23 2142 09/17/23 2349 09/18/23 0528  GLUCAP 68* 250* 259* 306* 357*    Coagulation Studies: Recent Labs    09/17/23 2316  LABPROT 15.0  INR 1.1     Imaging   DG CHEST PORT 1 VIEW Result Date: 09/17/2023 CLINICAL DATA:  Intubated EXAM: PORTABLE CHEST 1 VIEW COMPARISON:  09/17/2023, CT 03/24/2020 FINDINGS: Endotracheal tube tip is about 3.3 cm superior to carina. Enteric tube tip below the diaphragm but incompletely assessed. Emphysema. Borderline to mild cardiomegaly with aortic atherosclerosis. Interstitial and ground-glass opacity most evident in the right upper lobe. Vague more focal right upper lobe opacity IMPRESSION: 1. Endotracheal tube tip about 3.3 cm superior to carina. 2. Emphysema. Interstitial and ground-glass opacity most evident in the right upper lobe, possible pneumonia. Vague more focal right upper lobe opacity, indeterminate for focal pneumonia, nodule, or sclerotic bone lesion (history of metastatic disease). Chest CT follow-up could be obtained for further evaluation Electronically Signed   By: Luke Bun M.D.   On: 09/17/2023 22:52   DG Chest Port 1 View Result Date: 09/17/2023 CLINICAL DATA:  Intubated, cardiac arrest, history of prostate cancer EXAM: PORTABLE CHEST 1 VIEW  COMPARISON:  03/24/2020 FINDINGS: Two frontal views of the chest demonstrate an endotracheal tube overlying tracheal air column, tip approximately 4 cm above carina. Enteric catheter passes below diaphragm, tip projecting over the gastric body. External defibrillator pads are noted. The cardiac silhouette is unremarkable. Diffuse interstitial prominence with bilateral ground-glass airspace disease, greatest in the right  upper lobe, compatible with edema. No effusion or pneumothorax. No acute displaced fractures. IMPRESSION: 1. Support devices as above. 2. Interstitial and ground-glass opacities, most pronounced in the right upper lobe, favor asymmetric edema. Electronically Signed   By: Ozell Daring M.D.   On: 09/17/2023 19:30     Medications:     Current Medications:  acetaminophen   650 mg Oral Q4H   Or   acetaminophen  (TYLENOL ) oral liquid 160 mg/5 mL  650 mg Per Tube Q4H   Or   acetaminophen   650 mg Rectal Q4H   Chlorhexidine  Gluconate Cloth  6 each Topical Daily   docusate  100 mg Per Tube BID   famotidine   20 mg Per Tube BID   heparin   5,000 Units Subcutaneous Q8H   insulin  aspart  0-9 Units Subcutaneous Q4H   mouth rinse  15 mL Mouth Rinse Q2H   polyethylene glycol  17 g Per Tube Daily    Infusions:  sodium chloride      sodium chloride      amiodarone      epinephrine  3 mcg/min (09/18/23 0500)   fentaNYL  infusion INTRAVENOUS 50 mcg/hr (09/18/23 0500)   isoproterenol  (ISUPREL ) 1 mg in dextrose  5 % 250 mL (0.004 mg/mL) infusion 13 mcg/min (09/18/23 9392)   lidocaine  1 mg/min (09/18/23 0500)   magnesium  sulfate 100 mL/hr at 09/18/23 0500   norepinephrine  (LEVOPHED ) Adult infusion 9 mcg/min (09/18/23 0500)   potassium chloride  10 mEq (09/18/23 9366)   propofol  (DIPRIVAN ) infusion 40 mcg/kg/min (09/18/23 9388)      Patient Profile   Kyle Vanwyk is a 73 year old with history of metastatic prostate cancer, bone metastasis, HTN, and DM.   Admitted to [facility] from [facility] with cardiac arrest, VF, and CHB   Assessment/Plan   Cardiac Arrest, VF, Cardiogenic Shock, CHB VF arrest shock 4 with ROSC. Intubated . Placed on amiodarone  and lidocaine . Later developed bradycardia. Amiodarone  stopped.  Lactic acid 8> 6. Started on Isuprel .for bradycardia  Continue lidocaine  drip. Check level in am.  Currently on Epi 7 + Norepi 10. Check CO-OX  Set up CVP.  Check ECHO. Follow over the next few days for  prognostication.   Taken to cath lab for temporary pacer. Dr zenaida discussed with his soon.  Plan to wean Isuprel  once temporary paced is placed.   Avoid nodal blocers.  Follow over the next few days for prognostication.    Lactic acidosis In the setting of arrest. Lactic acid 8>6 As above Temp Pacer is being placed.   Acute Hypoxic Respiratory Failure  Per CCM. Intubated at [facility] during arrest. Placed on Unasyn  for possible aspiration.   Transaminitis Suspect in the setting of shock. Follow.   AKI  Worseing renal function. Poor urine output noted.   Metastatic Prostate Cancer, Bone mets     Length of Stay: 1  Mariachristina Holle, NP  09/18/2023, 7:06 AM    Advanced Heart Failure Team Pager [phone] (M-F; 7a - 5p)  Please contact [facility] for night-coverage after hours (4p -7a ) and weekends on [email or web address]

## 2023-09-18 NOTE — Progress Notes (Signed)
 EEG complete - results pending

## 2023-09-18 NOTE — Progress Notes (Signed)
 Patient was transported to [facility] Lab 5 via the ventilator @ 0745 and transported to 2H02 via the ventilator without any complications @ 0900. Ventilator connected to gas sources and plugged into red outlet.

## 2023-09-18 NOTE — Progress Notes (Signed)
 eLink Physician-Brief Progress Note Patient Name: Kyle George DOB: December 25, 1950 MRN: [id number]   Date of Service  09/18/2023  HPI/Events of Note  RN reports patient hyperglycemic, needs CBG/SSI q4h orders CBGS 250s to 300s. Initially 60s  Creatinine 1.7  eICU Interventions  Ordered to start low dose SSI Bedside rounding team to adjust coverage accordingly     Intervention Category Intermediate Interventions: Hyperglycemia - evaluation and treatment  Kyle George [name] 09/18/2023, 4:05 AM

## 2023-09-18 NOTE — Progress Notes (Signed)
 eLink Physician-Brief Progress Note Patient Name: Kyle George DOB: 1950/07/06 MRN: [id number]   Date of Service  09/18/2023  HPI/Events of Note  Notified on pressor requirement and need for central line Cardiology also made aware and will evaluate  eICU Interventions  Informed bedside CCM team     Intervention Category Intermediate Interventions: Hypotension - evaluation and management;Other:  Kyle George 09/18/2023, 5:19 AM

## 2023-09-18 NOTE — Progress Notes (Signed)
 eLink Physician-Brief Progress Note Patient Name: Kyle George DOB: [birth year] MRN: [id number]   Date of Service  09/18/2023  HPI/Events of Note  Patient with reproducible anterior chest wall pain which was exacerbated by palpation and relieved by discontinuing palpation, pain resolved with re-positioning the patient and was not exacerbated by deep breathing, consistent with musculoskeletal pain post-CPR.  eICU Interventions  12 lead EKG without changes. Bedside RN has already given ordered po Oxycodone  and Tylenol .        [name] 09/18/2023, 11:30 PM

## 2023-09-18 NOTE — Progress Notes (Signed)
  Echocardiogram 2D Echocardiogram has been performed.  Devora Ellouise SAUNDERS 09/18/2023, 10:44 AM

## 2023-09-18 NOTE — Progress Notes (Signed)
 Awake, following commands, doing well on 5/5. Will plan for 30 min SBT and extubate if he passes.   Leita SHAUNNA Gaskins, DO 09/18/23 10:56 AM [place] [facility]  For contact information, see Amion. If no response to pager, please call PCCM consult pager. After hours, 7PM- 7AM, please call Elink.

## 2023-09-18 NOTE — Progress Notes (Signed)
 eLink Physician-Brief Progress Note Patient Name: Kyle George DOB: 10-18-1950 MRN: [id number]   Date of Service  09/18/2023  HPI/Events of Note  Notified of lactate 8.1 from 7.9 Oligoanuric Just started on isoproterenol  for bradycardia as per cardiology recommendation  K 3.3, Mg  1.6  eICU Interventions  Ordered K 10 meqs x 4 Magnesium  to be replaced as per protocol Will repeat lactate with AM labs and assess response with improved HR and cardiac output Discussed with BSRN     Intervention Category Intermediate Interventions: Diagnostic test evaluation  Damien ONEIDA Grout 09/18/2023, 3:12 AM

## 2023-09-18 NOTE — Procedures (Signed)
 Arterial Line Insertion Start/End9/09/2023 1:34 AM, 09/18/2023 1:34 AM  Patient location: ICU. Preanesthetic checklist: patient identified, risks and benefits discussed, surgical consent, monitors and equipment checked, pre-op  evaluation and timeout performed Right, radial was placed Catheter size: 20 G Hand hygiene performed , maximum sterile barriers used  and Seldinger technique used Allen's test indicative of satisfactory collateral circulation Attempts: 1 Procedure performed without using ultrasound guided technique. Following insertion, dressing applied and Biopatch. Post procedure assessment: normal  Patient tolerated the procedure well with no immediate complications.  Alarms Checked

## 2023-09-18 NOTE — Progress Notes (Signed)
 NAME:  DEGAN HANSER, MRN:  [id number], DOB:  10/18/1950, LOS: 1 ADMISSION DATE:  09/17/2023, CONSULTATION DATE:  9/8 REFERRING MD: EDP, CHIEF COMPLAINT:  VF cardiac arrest   History of Present Illness:  73 yo male presented from the cancer center today with asymptomatic bradycardia. He initially was in the ED awake and alert, talking, but suffered a VF arrest. He required defibrillation x 4, amiodarone  to remain out of VF. He is bradycardic with rates in the 50s on amiodarone  and epinephrine  infusions. He was intubated during arrest. Cardiology is being consulted. PCCM was consulted for admission to [facility].  All history is obtained from chart and quite limited, as pt is intubated at this time.  Lactate >7 pH 7.1, mag and potassium all within normal range.  Pt arrived to our icu bradycardic and irreg. Rate in 30's on lidocaine /propofol  and 10 of epi. Cardiology pending their evaluation as they had also ordered amio but already on lido and already with HR in 30's.   Pertinent  Medical History  Stage IV prostate cancer HTN HLD DM  Significant Hospital Events: Including procedures, antibiotic start and stop dates in addition to other pertinent events   9/8 VF cardiac arrest after bradycardia  Interim History / Subjective:  Was able to follow commands overnight per RN.  Objective    Blood pressure 106/72, pulse (!) 53, temperature 99 F (37.2 C), resp. rate (!) 24, height 5' 7 (1.702 m), weight 72.9 kg, SpO2 99%.    Vent Mode: PRVC FiO2 (%):  [60 %-100 %] 60 % Set Rate:  [24 bmp] 24 bmp Vt Set:  [520 mL] 520 mL PEEP:  [8 cmH20] 8 cmH20 Plateau Pressure:  [15 cmH20-19 cmH20] 15 cmH20   Intake/Output Summary (Last 24 hours) at 09/18/2023 0750 Last data filed at 09/18/2023 0700 Gross per 24 hour  Intake 4423.28 ml  Output --  Net 4423.28 ml   Filed Weights   09/17/23 1601 09/17/23 2145  Weight: 75 kg 72.9 kg    Examination: General: critically ill appearing man lying in bed in  NAD HENT: [place]/AT, eyes anicteric Lungs: breathing comfortably on MV, breathing over the vent. CTAB. Minimal blood-tinged secretions from ETT. Cardiovascular: S1S2,precordial heave. Reg rhythm, bradycardic.  Abdomen: soft, NT Extremities: no edema, LLE IO Neuro: sedated on propofol  & fentanyl , + cough, opens eyes to stimulation. RASS -4.  GU: amber urine in foley  K+ 3.2 Bicarb 17 BUN 26 Cr 1.69 LA 7.9>8.1>5.9 WBC 19.7 H/H 12.5/39.6 Platelets 219 CXR personally reviewed> CVC in appropriate position, no pneumothorax, improving RUL infiltrate  Resolved problem list   Assessment and Plan   VF cardiac arrest; suspect was either electrolyte or R on T related CHB -TVP this morning -planning to d/c isoproterenol  once TVP is in -wean off epi to maintain MAP >70 -correct electrolytes- K+, Mg+, Ca+ ordered  Lactic acidosis due to hypoperfusion -trend -TVP to help increase CO -vasopressors as needed to maintain MAP >70  Acute respiratory failure with hypoxia requiring MV RUL opacity, suspect aspiration -LTVV -VAP prevention protocol -PAD protocol for sedation -daily SAT & SBT as appropriate -add unasyn  for aspiration prophylaxis  AKI, likely ATN from cardiac arrest -strict I/O -renally dose meds, avoid nephrotoxins -maintain adequate perfusion  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  -OP follow up as previously scheduled -holding PTA Xtandi   Hyperglycemia post arrest; improving DM; A1c 5.3 -SSI -goal BG 140-180 -hold PTA ozempic , Actos , metformin  (likely doesn't still need actos  at discharge with  A1c)  Acute encephalopathy post-arrest -If following commands, doesn't need TTM. Will do wake up assessment after TVP.  Hypokalemia Hypocalcemia Hypomagnesemia -replete, monitor  Acute anemia- potentially dilutional -transfuse for Hb <7 or hemodynamically significant bleeding  H/o HTN -hold PTA olmesartan , hydrochlorothiazide    Son updated at bedside during rounds  with Dr. Zenaida.  Labs   CBC: Recent Labs  Lab 09/17/23 1327 09/17/23 1605 09/17/23 1814 09/17/23 2215 09/17/23 2316 09/18/23 0125  WBC 6.9 PENDING 6.7  --  20.7* 19.7*  NEUTROABS 4.6 PENDING 4.6  --   --   --   HGB 14.1 14.1 13.4 12.9* 12.8* 12.5*  HCT 42.4 42.8 40.3 38.0* 40.0 39.6  MCV 107.1* 108.1* 106.6*  --  109.6* 107.9*  PLT 202 188 186  --  231 219    Basic Metabolic Panel: Recent Labs  Lab 09/17/23 1327 09/17/23 1814 09/17/23 2215 09/17/23 2316 09/18/23 0125 09/18/23 0323  NA 141 139 138  --  139 138  K 3.9 3.8 3.3*  --  3.3* 3.2*  CL 104 104  --   --  104 104  CO2 23 23  --   --  17* 17*  GLUCOSE 70 74  --   --  339* 344*  BUN 19 17  --   --  24* 26*  CREATININE 1.02 0.93  --  1.44* 1.71* 1.69*  CALCIUM  8.9 8.6*  --   --  8.7* 8.5*  MG  --  1.9  --   --  1.6*  --    GFR: Estimated Creatinine Clearance: 35.9 mL/min (A) (by C-G formula based on SCr of 1.69 mg/dL (H)). Recent Labs  Lab 09/17/23 1605 09/17/23 1814 09/17/23 1905 09/17/23 2316 09/18/23 0125 09/18/23 0323  WBC PENDING 6.7  --  20.7* 19.7*  --   LATICACIDVEN  --   --  7.7* 7.9* 8.1* 5.9*    Liver Function Tests: Recent Labs  Lab 09/17/23 1327 09/17/23 1814 09/18/23 0125  AST 17 15 869*  ALT 10 10 659*  ALKPHOS 48 43 50  BILITOT 0.6 0.6 0.6  PROT 7.2 6.6 6.0*  ALBUMIN 3.5 3.2* 2.8*   Critical care time:      This patient is critically ill with multiple organ system failure which requires frequent high complexity decision making, assessment, support, evaluation, and titration of therapies. This was completed through the application of advanced monitoring technologies and extensive interpretation of multiple databases. During this encounter critical care time was devoted to patient care services described in this note for 35 minutes.  Leita SHAUNNA Gaskins, DO 09/18/23 8:08 AM [place] [facility]  For contact information, see Amion. If no response to pager, please call  PCCM consult pager. After hours, 7PM- 7AM, please call Elink.

## 2023-09-18 NOTE — H&P (View-Only) (Signed)
 ELECTROPHYSIOLOGY CONSULT NOTE    Patient ID: Kyle George MRN: [id number], DOB/AGE: August 14, 1950 73 y.o.  Admit date: 09/17/2023 Date of Consult: 09/18/2023  Primary Physician: [name], [name] Primary Cardiologist: None  Electrophysiologist: none   Referring Provider: @ATTENDING @  Patient Profile: Kyle George is a 73 y.o. male with a history of prostate ca w bone mets, HTN, DM who is being seen today for the evaluation of VF arrest at the request of Dr. Zenaida.  HPI:  Kyle George is a 73 y.o. male with PMH as above. He was at outpatient oncology appt yesterday with HR in 30s, transported to [facility]. While in ER, had VF arrest, shock x 4 with ROSC. Intubated, sedated, started on amiodarone  + lidocaine  gtt. Transferred to [facility]. CHB on arrival to [facility], started on isuprel , then on norepi and epi Lactic acid trending down 8 > 5.9.  He is now s/p temp wire placement.   Currently, he is extubated, feels bewildered that he has at [facility].  He is off norepi, epi, amio; continues on lidocaine .  Wife at bedside. He does complain of chest discomfort. NO palpitations, edema.  Prior to this event, he was independent with ADLs, worked most days in his garden.    Labs Potassium3.2* (09/09 9676) Magnesium   1.6* (09/09 0125) Creatinine, ser  1.69* (09/09 0323) PLT  219 (09/09 0125) HGB  12.5* (09/09 0125) WBC 19.7* (09/09 0125) Troponin I (High Sensitivity)9 (09/08 1814).    Past Medical History:  Diagnosis Date   Cancer of prostate (HCC)    Diabetes mellitus without complication (HCC)    Hyperlipidemia    Hypertension      Surgical History:  Past Surgical History:  Procedure Laterality Date   COLONOSCOPY     HEMORRHOID SURGERY       Medications Prior to Admission  Medication Sig Dispense Refill Last Dose/Taking   aspirin 81 MG tablet Take 81 mg by mouth daily.   09/17/2023 Morning   Calcium  Carbonate-Vitamin D  (CALCIUM  600+D PO) Take 1 tablet by mouth daily. Taking calcium  + D3    09/17/2023 Morning   cholecalciferol (VITAMIN D ) 1000 UNITS tablet Take 5,000 Units by mouth daily.   09/17/2023 Morning   enzalutamide  (XTANDI ) 40 MG tablet TAKE 4 TABLETS (160 MG TOTAL) BY MOUTH DAILY. (Patient taking differently: Take 160 mg by mouth daily.) 120 tablet 3 09/17/2023 at  9:30 AM   Olmesartan -amLODIPine -HCTZ 40-10-25 MG TABS TAKE 1 TABLET BY MOUTH EVERY DAY 90 tablet 1 09/17/2023 Morning   Omega-3 Fatty Acids (FISH OIL PO) Take 1 tablet by mouth daily.   09/17/2023 Morning   pioglitazone -metformin  (ACTOPLUS MET ) 15-850 MG tablet Take 1 tablet by mouth 2 (two) times daily. 180 tablet 1 09/17/2023 Morning   rosuvastatin  (CRESTOR ) 10 MG tablet TAKE 1 TABLET BY MOUTH EVERY DAY 90 tablet 1 09/17/2023   Semaglutide ,0.25 or 0.5MG /DOS, (OZEMPIC , 0.25 OR 0.5 MG/DOSE,) 2 MG/3ML SOPN INJECT 0.25 MG WEEKLY FOR 4 WEEKS, THEN INCREASE TO 0.5 MG WEEKLY (Patient taking differently: Inject 0.5 mg into the skin every Wednesday. Inject 0.25 mg weekly for 4 weeks, then increase to 0.5 mg weekly) 3 mL 2 Past Week   spironolactone  (ALDACTONE ) 25 MG tablet TAKE 1 TABLET (25 MG TOTAL) BY MOUTH DAILY. 90 tablet 1 09/17/2023 Morning    Inpatient Medications:   acetaminophen   650 mg Oral Q4H   Or   acetaminophen  (TYLENOL ) oral liquid 160 mg/5 mL  650 mg Per Tube Q4H   Or  acetaminophen   650 mg Rectal Q4H   arformoterol   15 mcg Nebulization BID   budesonide  (PULMICORT ) nebulizer solution  0.25 mg Nebulization BID   Chlorhexidine  Gluconate Cloth  6 each Topical Daily   docusate  100 mg Per Tube BID   famotidine   20 mg Per Tube Daily   heparin   5,000 Units Subcutaneous Q8H   insulin  aspart  0-15 Units Subcutaneous Q4H   polyethylene glycol  17 g Per Tube Daily   potassium chloride   40 mEq Per Tube Q3H    Allergies: No Known Allergies  Family History  Problem Relation Age of Onset   Hypertension Mother    Diabetes Mother    Cancer Father        He is unsure of what type of cancer but it was not prostate  cancer   Cancer Paternal Uncle        Pt is unsure of what type of cancer   Colon cancer Neg Hx    Esophageal cancer Neg Hx    Stomach cancer Neg Hx    Rectal cancer Neg Hx      Physical Exam: Vitals:   09/18/23 1145 09/18/23 1200 09/18/23 1215 09/18/23 1300  BP:  105/71 105/71 113/73  Pulse: 89 89 89 89  Resp: (!) 21 (!) 26 (!) 22 (!) 22  Temp:      TempSrc:      SpO2: 96% 95% 91% 96%  Weight:      Height:        GEN- NAD, A&O x 3, soft-spoken HEENT: Normocephalic, atraumatic Lungs- CTAB, Normal effort.  Heart- Regular rate and rhythm, No M/G/R.  GI- Soft, NT, ND.  Extremities- No clubbing, cyanosis, or edema   Radiology/Studies: ECHOCARDIOGRAM COMPLETE Result Date: 09/18/2023    ECHOCARDIOGRAM REPORT   Patient Name:   Kyle George Date of Exam: 09/18/2023 Medical Rec #:  [id number]      Height:       67.0 in Accession #:    [id number]     Weight:       160.7 lb Date of Birth:  [birth year]      BSA:          1.843 m Patient Age:    73 years       BP:           92/75 mmHg Patient Gender: M              HR:           89 bpm. Exam Location:  Inpatient Procedure: 2D Echo, Cardiac Doppler and Color Doppler (Both Spectral and Color            Flow Doppler were utilized during procedure). Indications:    R94.31 Abnormal EKG. Cardiac arrest.  History:        Patient has no prior history of Echocardiogram examinations.                 Abnormal ECG, Arrythmias:RBBB; Risk Factors:Hypertension,                 Diabetes and Dyslipidemia. Cancer.  Sonographer:    Ellouise Mose RDCS Referring Phys: [id number] Kyle George  Sonographer Comments: Echo performed with patient supine and on artificial respirator. IMPRESSIONS  1. Left ventricular ejection fraction, by estimation, is 55 to 60%. The left ventricle has normal function. The left ventricle has no regional wall motion abnormalities. There is moderate concentric left ventricular hypertrophy of the septal segment.  Indeterminate diastolic filling due to  E-A fusion.  2. Right ventricular systolic function is normal. The right ventricular size is normal. There is mildly elevated pulmonary artery systolic pressure. The estimated right ventricular systolic pressure is 42.5 mmHg.  3. The mitral valve is normal in structure. Mild mitral valve regurgitation. No evidence of mitral stenosis.  4. Tricuspid valve regurgitation is moderate to severe.  5. The aortic valve is tricuspid. There is mild calcification of the aortic valve. There is mild thickening of the aortic valve. Aortic valve regurgitation is not visualized. Aortic valve sclerosis/calcification is present, without any evidence of aortic stenosis. Comparison(s): No prior Echocardiogram. FINDINGS  Left Ventricle: Temp pacer in place. Left ventricular ejection fraction, by estimation, is 55 to 60%. The left ventricle has normal function. The left ventricle has no regional wall motion abnormalities. The left ventricular internal cavity size was normal in size. There is moderate concentric left ventricular hypertrophy of the septal segment. Abnormal (paradoxical) septal motion, consistent with RV pacemaker. Indeterminate diastolic filling due to E-A fusion. Right Ventricle: The right ventricular size is normal. No increase in right ventricular wall thickness. Right ventricular systolic function is normal. There is mildly elevated pulmonary artery systolic pressure. The tricuspid regurgitant velocity is 2.85  m/s, and with an assumed right atrial pressure of 10 mmHg, the estimated right ventricular systolic pressure is 42.5 mmHg. Left Atrium: Left atrial size was normal in size. Right Atrium: Right atrial size was normal in size. Pericardium: There is no evidence of pericardial effusion. Mitral Valve: The mitral valve is normal in structure. Mild mitral valve regurgitation. No evidence of mitral valve stenosis. Tricuspid Valve: The tricuspid valve is normal in structure. Tricuspid valve regurgitation is moderate to  severe. No evidence of tricuspid stenosis. Aortic Valve: The aortic valve is tricuspid. There is mild calcification of the aortic valve. There is mild thickening of the aortic valve. Aortic valve regurgitation is not visualized. Aortic valve sclerosis/calcification is present, without any evidence of aortic stenosis. Pulmonic Valve: The pulmonic valve was normal in structure. Pulmonic valve regurgitation is not visualized. No evidence of pulmonic stenosis. Aorta: The aortic root is normal in size and structure. Venous: IVC assessment for right atrial pressure unable to be performed due to mechanical ventilation. IAS/Shunts: No atrial level shunt detected by color flow Doppler. Additional Comments: A device lead is visualized in the right ventricle.  LEFT VENTRICLE PLAX 2D LVIDd:         3.80 cm     Diastology LVIDs:         2.38 cm     LV e' medial:    7.94 cm/s LV PW:         1.33 cm     LV E/e' medial:  12.5 LV IVS:        1.51 cm     LV e' lateral:   7.72 cm/s LVOT diam:     2.32 cm     LV E/e' lateral: 12.9 LV SV:         82 LV SV Index:   44 LVOT Area:     4.23 cm  LV Volumes (MOD) LV vol d, MOD A2C: 83.0 ml LV vol d, MOD A4C: 92.6 ml LV vol s, MOD A2C: 38.7 ml LV vol s, MOD A4C: 38.9 ml LV SV MOD A2C:     44.3 ml LV SV MOD A4C:     92.6 ml LV SV MOD BP:      51.2 ml RIGHT  VENTRICLE             IVC RV S prime:     14.90 cm/s  IVC diam: 2.38 cm TAPSE (M-mode): 2.1 cm LEFT ATRIUM           Index       RIGHT ATRIUM           Index LA diam:      3.49 cm 1.89 cm/m  RA Area:     15.80 cm LA Vol (A2C): 11.3 ml 6.13 ml/m  RA Volume:   38.50 ml  20.89 ml/m LA Vol (A4C): 15.6 ml 8.47 ml/m  AORTIC VALVE LVOT Vmax:   122.00 cm/s LVOT Vmean:  79.900 cm/s LVOT VTI:    0.193 m  AORTA Ao Root diam: 3.90 cm MITRAL VALVE                TRICUSPID VALVE MV Area (PHT): 5.07 cm     TR Peak grad:   32.5 mmHg MV Decel Time: 150 msec     TR Vmax:        285.00 cm/s MV E velocity: 99.35 cm/s MV A velocity: 132.00 cm/s  SHUNTS MV  E/A ratio:  0.75         Systemic VTI:  0.19 m                             Systemic Diam: 2.32 cm Kyle Croitoru MD Electronically signed by Kyle Balding MD Signature Date/Time: 09/18/2023/1:17:16 PM    Final    CARDIAC CATHETERIZATION Result Date: 09/18/2023 Successful placement of R internal jugular pacing wire for AV block, recent VF arrest Isoproterenol  turned off at the conclusion of the case   EEG adult Result Date: 09/18/2023 George Kyle KIDD, MD     09/18/2023  8:54 AM Patient Name: CODEY BURLING MRN: [id number] Epilepsy Attending: Arlin KIDD George Referring Physician/Provider: Layman Raisin, DO Date: 09/17/2023 Duration: 25.30 mins Patient history: 73yo F s/p cardiac arrest. EEG to evaluate for seizure Level of alertness: comatose AEDs during EEG study: Propofol  Technical aspects: This EEG study was done with scalp electrodes positioned according to the 10-20 International system of electrode placement. Electrical activity was reviewed with band pass filter of 1-70Hz , sensitivity of 7 uV/mm, display speed of 11mm/sec with a 60Hz  notched filter applied as appropriate. EEG data were recorded continuously and digitally stored.  Video monitoring was available and reviewed as appropriate. Description: EEG showed continuous generalized predominantly 5 to 9 Hz theta-alpha activity admixed with intermittent 2-3hz  delta slowing. Hyperventilation and photic stimulation were not performed.   ABNORMALITY - Continuous slow, generalized IMPRESSION: This study is suggestive of moderate diffuse encephalopathy. No seizures or epileptiform discharges were seen throughout the recording. Kyle George   DG Chest 1 View Result Date: 09/18/2023 EXAM: 1 VIEW XRAY OF THE CHEST 09/18/2023 07:29:00 AM COMPARISON: 09/17/2023 CLINICAL HISTORY: Displacement of central venous catheter (CVC). FINDINGS: LUNGS AND PLEURA: No pneumothorax. Partial clearing of asymmetric opacity within the right upper lobe. HEART AND MEDIASTINUM:  No acute abnormality of the cardiac and mediastinal silhouettes. Aortic atherosclerotic calcification. BONES AND SOFT TISSUES: No acute osseous findings. LINES AND TUBES: The endotracheal tube tip is 4.2 cm above the carina. Enteric tube tip courses below the gastroesophageal junction. There is a left internal jugular catheter with tip in the projection of the superior vena cava. IMPRESSION: 1. Partial clearing of asymmetric opacity within the right upper lobe.  2. Interval placement of left IJ catheter with tip in the projection of the SVC. No pneumothorax identified. Electronically signed by: Waddell Calk MD 09/18/2023 07:43 AM EDT RP Workstation: [id number]   DG CHEST PORT 1 VIEW Result Date: 09/17/2023 CLINICAL DATA:  Intubated EXAM: PORTABLE CHEST 1 VIEW COMPARISON:  09/17/2023, CT 03/24/2020 FINDINGS: Endotracheal tube tip is about 3.3 cm superior to carina. Enteric tube tip below the diaphragm but incompletely assessed. Emphysema. Borderline to mild cardiomegaly with aortic atherosclerosis. Interstitial and ground-glass opacity most evident in the right upper lobe. Vague more focal right upper lobe opacity IMPRESSION: 1. Endotracheal tube tip about 3.3 cm superior to carina. 2. Emphysema. Interstitial and ground-glass opacity most evident in the right upper lobe, possible pneumonia. Vague more focal right upper lobe opacity, indeterminate for focal pneumonia, nodule, or sclerotic bone lesion (history of metastatic disease). Chest CT follow-up could be obtained for further evaluation Electronically Signed   By: Luke Bun M.D.   On: 09/17/2023 22:52   DG Chest Port 1 View Result Date: 09/17/2023 CLINICAL DATA:  Intubated, cardiac arrest, history of prostate cancer EXAM: PORTABLE CHEST 1 VIEW COMPARISON:  03/24/2020 FINDINGS: Two frontal views of the chest demonstrate an endotracheal tube overlying tracheal air column, tip approximately 4 cm above carina. Enteric catheter passes below diaphragm, tip  projecting over the gastric body. External defibrillator pads are noted. The cardiac silhouette is unremarkable. Diffuse interstitial prominence with bilateral ground-glass airspace disease, greatest in the right upper lobe, compatible with edema. No effusion or pneumothorax. No acute displaced fractures. IMPRESSION: 1. Support devices as above. 2. Interstitial and ground-glass opacities, most pronounced in the right upper lobe, favor asymmetric edema. Electronically Signed   By: Ozell Daring M.D.   On: 09/17/2023 19:30    EKG:CHB at 37, wide ventricular escape (personally reviewed)  TELEMETRY: VP at 90 (personally reviewed)    DEVICE HISTORY: none  Assessment/Plan: #) VF arrest #) CHB  CHB with wide ventricular escape,  Arrest appears to start with R on T PVC VF arrest requiring shock x 4, started amiodarone , lidocaine  CHB, started on isuprel , now s/p temp wire and isuprel  off. Weaned off norepi, epi. Amiodarone  stopped.   TTE with normal LVEF Stop lidocaine  Remains CHB with escape in 40s Threshold 0.8, set at 8 No AVN blocking agents PTA, pending TSH If TSH normal, needs PPM Clear liquid diet at MN in case able to implant PPM tomorrow   #) T2DM On ozempic  with very little appetite  #) prostate ca with mets (R iliac) On xtandi , eligard      For questions or updates, please contact [facility] Please consult [email or web address] for contact info under Cardiology/STEMI.  Signed, Deja Kaigler, NP  09/18/2023 1:19 PM

## 2023-09-18 NOTE — Consult Note (Signed)
 ELECTROPHYSIOLOGY CONSULT NOTE    Patient ID: Kyle George MRN: [id number], DOB/AGE: May 17, 1950 73 y.o.  Admit date: 09/17/2023 Date of Consult: 09/18/2023  Primary Physician: [name], [name] Primary Cardiologist: None  Electrophysiologist: none   Referring Provider: @ATTENDING @  Patient Profile: Kyle George is a 73 y.o. male with a history of prostate ca w bone mets, HTN, DM who is being seen today for the evaluation of VF arrest at the request of Dr. Zenaida.  HPI:  Kyle George is a 73 y.o. male with PMH as above. He was at outpatient oncology appt yesterday with HR in 30s, transported to [facility]. While in ER, had VF arrest, shock x 4 with ROSC. Intubated, sedated, started on amiodarone  + lidocaine  gtt. Transferred to [facility]. CHB on arrival to [facility], started on isuprel , then on norepi and epi Lactic acid trending down 8 > 5.9.  He is now s/p temp wire placement.   Currently, he is extubated, feels bewildered that he has at [facility].  He is off norepi, epi, amio; continues on lidocaine .  Wife at bedside. He does complain of chest discomfort. NO palpitations, edema.  Prior to this event, he was independent with ADLs, worked most days in his garden.    Labs Potassium3.2* (09/09 9676) Magnesium   1.6* (09/09 0125) Creatinine, ser  1.69* (09/09 0323) PLT  219 (09/09 0125) HGB  12.5* (09/09 0125) WBC 19.7* (09/09 0125) Troponin I (High Sensitivity)9 (09/08 1814).    Past Medical History:  Diagnosis Date   Cancer of prostate (HCC)    Diabetes mellitus without complication (HCC)    Hyperlipidemia    Hypertension      Surgical History:  Past Surgical History:  Procedure Laterality Date   COLONOSCOPY     HEMORRHOID SURGERY       Medications Prior to Admission  Medication Sig Dispense Refill Last Dose/Taking   aspirin 81 MG tablet Take 81 mg by mouth daily.   09/17/2023 Morning   Calcium  Carbonate-Vitamin D  (CALCIUM  600+D PO) Take 1 tablet by mouth daily. Taking calcium  + D3    09/17/2023 Morning   cholecalciferol (VITAMIN D ) 1000 UNITS tablet Take 5,000 Units by mouth daily.   09/17/2023 Morning   enzalutamide  (XTANDI ) 40 MG tablet TAKE 4 TABLETS (160 MG TOTAL) BY MOUTH DAILY. (Patient taking differently: Take 160 mg by mouth daily.) 120 tablet 3 09/17/2023 at  9:30 AM   Olmesartan -amLODIPine -HCTZ 40-10-25 MG TABS TAKE 1 TABLET BY MOUTH EVERY DAY 90 tablet 1 09/17/2023 Morning   Omega-3 Fatty Acids (FISH OIL PO) Take 1 tablet by mouth daily.   09/17/2023 Morning   pioglitazone -metformin  (ACTOPLUS MET ) 15-850 MG tablet Take 1 tablet by mouth 2 (two) times daily. 180 tablet 1 09/17/2023 Morning   rosuvastatin  (CRESTOR ) 10 MG tablet TAKE 1 TABLET BY MOUTH EVERY DAY 90 tablet 1 09/17/2023   Semaglutide ,0.25 or 0.5MG /DOS, (OZEMPIC , 0.25 OR 0.5 MG/DOSE,) 2 MG/3ML SOPN INJECT 0.25 MG WEEKLY FOR 4 WEEKS, THEN INCREASE TO 0.5 MG WEEKLY (Patient taking differently: Inject 0.5 mg into the skin every Wednesday. Inject 0.25 mg weekly for 4 weeks, then increase to 0.5 mg weekly) 3 mL 2 Past Week   spironolactone  (ALDACTONE ) 25 MG tablet TAKE 1 TABLET (25 MG TOTAL) BY MOUTH DAILY. 90 tablet 1 09/17/2023 Morning    Inpatient Medications:   acetaminophen   650 mg Oral Q4H   Or   acetaminophen  (TYLENOL ) oral liquid 160 mg/5 mL  650 mg Per Tube Q4H   Or  acetaminophen   650 mg Rectal Q4H   arformoterol   15 mcg Nebulization BID   budesonide  (PULMICORT ) nebulizer solution  0.25 mg Nebulization BID   Chlorhexidine  Gluconate Cloth  6 each Topical Daily   docusate  100 mg Per Tube BID   famotidine   20 mg Per Tube Daily   heparin   5,000 Units Subcutaneous Q8H   insulin  aspart  0-15 Units Subcutaneous Q4H   polyethylene glycol  17 g Per Tube Daily   potassium chloride   40 mEq Per Tube Q3H    Allergies: No Known Allergies  Family History  Problem Relation Age of Onset   Hypertension Mother    Diabetes Mother    Cancer Father        He is unsure of what type of cancer but it was not prostate  cancer   Cancer Paternal Uncle        Pt is unsure of what type of cancer   Colon cancer Neg Hx    Esophageal cancer Neg Hx    Stomach cancer Neg Hx    Rectal cancer Neg Hx      Physical Exam: Vitals:   09/18/23 1145 09/18/23 1200 09/18/23 1215 09/18/23 1300  BP:  105/71 105/71 113/73  Pulse: 89 89 89 89  Resp: (!) 21 (!) 26 (!) 22 (!) 22  Temp:      TempSrc:      SpO2: 96% 95% 91% 96%  Weight:      Height:        GEN- NAD, A&O x 3, soft-spoken HEENT: Normocephalic, atraumatic Lungs- CTAB, Normal effort.  Heart- Regular rate and rhythm, No M/G/R.  GI- Soft, NT, ND.  Extremities- No clubbing, cyanosis, or edema   Radiology/Studies: ECHOCARDIOGRAM COMPLETE Result Date: 09/18/2023    ECHOCARDIOGRAM REPORT   Patient Name:   Kyle George Date of Exam: 09/18/2023 Medical Rec #:  [id number]      Height:       67.0 in Accession #:    [id number]     Weight:       160.7 lb Date of Birth:  [birth year]      BSA:          1.843 m Patient Age:    73 years       BP:           92/75 mmHg Patient Gender: M              HR:           89 bpm. Exam Location:  Inpatient Procedure: 2D Echo, Cardiac Doppler and Color Doppler (Both Spectral and Color            Flow Doppler were utilized during procedure). Indications:    R94.31 Abnormal EKG. Cardiac arrest.  History:        Patient has no prior history of Echocardiogram examinations.                 Abnormal ECG, Arrythmias:RBBB; Risk Factors:Hypertension,                 Diabetes and Dyslipidemia. Cancer.  Sonographer:    Ellouise Mose RDCS Referring Phys: [id number] JESSICA MARSHALL  Sonographer Comments: Echo performed with patient supine and on artificial respirator. IMPRESSIONS  1. Left ventricular ejection fraction, by estimation, is 55 to 60%. The left ventricle has normal function. The left ventricle has no regional wall motion abnormalities. There is moderate concentric left ventricular hypertrophy of the septal segment.  Indeterminate diastolic filling due to  E-A fusion.  2. Right ventricular systolic function is normal. The right ventricular size is normal. There is mildly elevated pulmonary artery systolic pressure. The estimated right ventricular systolic pressure is 42.5 mmHg.  3. The mitral valve is normal in structure. Mild mitral valve regurgitation. No evidence of mitral stenosis.  4. Tricuspid valve regurgitation is moderate to severe.  5. The aortic valve is tricuspid. There is mild calcification of the aortic valve. There is mild thickening of the aortic valve. Aortic valve regurgitation is not visualized. Aortic valve sclerosis/calcification is present, without any evidence of aortic stenosis. Comparison(s): No prior Echocardiogram. FINDINGS  Left Ventricle: Temp pacer in place. Left ventricular ejection fraction, by estimation, is 55 to 60%. The left ventricle has normal function. The left ventricle has no regional wall motion abnormalities. The left ventricular internal cavity size was normal in size. There is moderate concentric left ventricular hypertrophy of the septal segment. Abnormal (paradoxical) septal motion, consistent with RV pacemaker. Indeterminate diastolic filling due to E-A fusion. Right Ventricle: The right ventricular size is normal. No increase in right ventricular wall thickness. Right ventricular systolic function is normal. There is mildly elevated pulmonary artery systolic pressure. The tricuspid regurgitant velocity is 2.85  m/s, and with an assumed right atrial pressure of 10 mmHg, the estimated right ventricular systolic pressure is 42.5 mmHg. Left Atrium: Left atrial size was normal in size. Right Atrium: Right atrial size was normal in size. Pericardium: There is no evidence of pericardial effusion. Mitral Valve: The mitral valve is normal in structure. Mild mitral valve regurgitation. No evidence of mitral valve stenosis. Tricuspid Valve: The tricuspid valve is normal in structure. Tricuspid valve regurgitation is moderate to  severe. No evidence of tricuspid stenosis. Aortic Valve: The aortic valve is tricuspid. There is mild calcification of the aortic valve. There is mild thickening of the aortic valve. Aortic valve regurgitation is not visualized. Aortic valve sclerosis/calcification is present, without any evidence of aortic stenosis. Pulmonic Valve: The pulmonic valve was normal in structure. Pulmonic valve regurgitation is not visualized. No evidence of pulmonic stenosis. Aorta: The aortic root is normal in size and structure. Venous: IVC assessment for right atrial pressure unable to be performed due to mechanical ventilation. IAS/Shunts: No atrial level shunt detected by color flow Doppler. Additional Comments: A device lead is visualized in the right ventricle.  LEFT VENTRICLE PLAX 2D LVIDd:         3.80 cm     Diastology LVIDs:         2.38 cm     LV e' medial:    7.94 cm/s LV PW:         1.33 cm     LV E/e' medial:  12.5 LV IVS:        1.51 cm     LV e' lateral:   7.72 cm/s LVOT diam:     2.32 cm     LV E/e' lateral: 12.9 LV SV:         82 LV SV Index:   44 LVOT Area:     4.23 cm  LV Volumes (MOD) LV vol d, MOD A2C: 83.0 ml LV vol d, MOD A4C: 92.6 ml LV vol s, MOD A2C: 38.7 ml LV vol s, MOD A4C: 38.9 ml LV SV MOD A2C:     44.3 ml LV SV MOD A4C:     92.6 ml LV SV MOD BP:      51.2 ml RIGHT  VENTRICLE             IVC RV S prime:     14.90 cm/s  IVC diam: 2.38 cm TAPSE (M-mode): 2.1 cm LEFT ATRIUM           Index       RIGHT ATRIUM           Index LA diam:      3.49 cm 1.89 cm/m  RA Area:     15.80 cm LA Vol (A2C): 11.3 ml 6.13 ml/m  RA Volume:   38.50 ml  20.89 ml/m LA Vol (A4C): 15.6 ml 8.47 ml/m  AORTIC VALVE LVOT Vmax:   122.00 cm/s LVOT Vmean:  79.900 cm/s LVOT VTI:    0.193 m  AORTA Ao Root diam: 3.90 cm MITRAL VALVE                TRICUSPID VALVE MV Area (PHT): 5.07 cm     TR Peak grad:   32.5 mmHg MV Decel Time: 150 msec     TR Vmax:        285.00 cm/s MV E velocity: 99.35 cm/s MV A velocity: 132.00 cm/s  SHUNTS MV  E/A ratio:  0.75         Systemic VTI:  0.19 m                             Systemic Diam: 2.32 cm Jerel Croitoru MD Electronically signed by Jerel Balding MD Signature Date/Time: 09/18/2023/1:17:16 PM    Final    CARDIAC CATHETERIZATION Result Date: 09/18/2023 Successful placement of R internal jugular pacing wire for AV block, recent VF arrest Isoproterenol  turned off at the conclusion of the case   EEG adult Result Date: 09/18/2023 Shelton Arlin KIDD, MD     09/18/2023  8:54 AM Patient Name: JAIS DEMIR MRN: [id number] Epilepsy Attending: Arlin KIDD Shelton Referring Physician/Provider: Layman Raisin, DO Date: 09/17/2023 Duration: 25.30 mins Patient history: 73yo F s/p cardiac arrest. EEG to evaluate for seizure Level of alertness: comatose AEDs during EEG study: Propofol  Technical aspects: This EEG study was done with scalp electrodes positioned according to the 10-20 International system of electrode placement. Electrical activity was reviewed with band pass filter of 1-70Hz , sensitivity of 7 uV/mm, display speed of 60mm/sec with a 60Hz  notched filter applied as appropriate. EEG data were recorded continuously and digitally stored.  Video monitoring was available and reviewed as appropriate. Description: EEG showed continuous generalized predominantly 5 to 9 Hz theta-alpha activity admixed with intermittent 2-3hz  delta slowing. Hyperventilation and photic stimulation were not performed.   ABNORMALITY - Continuous slow, generalized IMPRESSION: This study is suggestive of moderate diffuse encephalopathy. No seizures or epileptiform discharges were seen throughout the recording. Arlin KIDD Shelton   DG Chest 1 View Result Date: 09/18/2023 EXAM: 1 VIEW XRAY OF THE CHEST 09/18/2023 07:29:00 AM COMPARISON: 09/17/2023 CLINICAL HISTORY: Displacement of central venous catheter (CVC). FINDINGS: LUNGS AND PLEURA: No pneumothorax. Partial clearing of asymmetric opacity within the right upper lobe. HEART AND MEDIASTINUM:  No acute abnormality of the cardiac and mediastinal silhouettes. Aortic atherosclerotic calcification. BONES AND SOFT TISSUES: No acute osseous findings. LINES AND TUBES: The endotracheal tube tip is 4.2 cm above the carina. Enteric tube tip courses below the gastroesophageal junction. There is a left internal jugular catheter with tip in the projection of the superior vena cava. IMPRESSION: 1. Partial clearing of asymmetric opacity within the right upper lobe.  2. Interval placement of left IJ catheter with tip in the projection of the SVC. No pneumothorax identified. Electronically signed by: Waddell Calk MD 09/18/2023 07:43 AM EDT RP Workstation: [id number]   DG CHEST PORT 1 VIEW Result Date: 09/17/2023 CLINICAL DATA:  Intubated EXAM: PORTABLE CHEST 1 VIEW COMPARISON:  09/17/2023, CT 03/24/2020 FINDINGS: Endotracheal tube tip is about 3.3 cm superior to carina. Enteric tube tip below the diaphragm but incompletely assessed. Emphysema. Borderline to mild cardiomegaly with aortic atherosclerosis. Interstitial and ground-glass opacity most evident in the right upper lobe. Vague more focal right upper lobe opacity IMPRESSION: 1. Endotracheal tube tip about 3.3 cm superior to carina. 2. Emphysema. Interstitial and ground-glass opacity most evident in the right upper lobe, possible pneumonia. Vague more focal right upper lobe opacity, indeterminate for focal pneumonia, nodule, or sclerotic bone lesion (history of metastatic disease). Chest CT follow-up could be obtained for further evaluation Electronically Signed   By: Luke Bun M.D.   On: 09/17/2023 22:52   DG Chest Port 1 View Result Date: 09/17/2023 CLINICAL DATA:  Intubated, cardiac arrest, history of prostate cancer EXAM: PORTABLE CHEST 1 VIEW COMPARISON:  03/24/2020 FINDINGS: Two frontal views of the chest demonstrate an endotracheal tube overlying tracheal air column, tip approximately 4 cm above carina. Enteric catheter passes below diaphragm, tip  projecting over the gastric body. External defibrillator pads are noted. The cardiac silhouette is unremarkable. Diffuse interstitial prominence with bilateral ground-glass airspace disease, greatest in the right upper lobe, compatible with edema. No effusion or pneumothorax. No acute displaced fractures. IMPRESSION: 1. Support devices as above. 2. Interstitial and ground-glass opacities, most pronounced in the right upper lobe, favor asymmetric edema. Electronically Signed   By: Ozell Daring M.D.   On: 09/17/2023 19:30    EKG:CHB at 37, wide ventricular escape (personally reviewed)  TELEMETRY: VP at 90 (personally reviewed)    DEVICE HISTORY: none  Assessment/Plan: #) VF arrest #) CHB  CHB with wide ventricular escape,  Arrest appears to start with R on T PVC VF arrest requiring shock x 4, started amiodarone , lidocaine  CHB, started on isuprel , now s/p temp wire and isuprel  off. Weaned off norepi, epi. Amiodarone  stopped.   TTE with normal LVEF Stop lidocaine  Remains CHB with escape in 40s Threshold 0.8, set at 8 No AVN blocking agents PTA, pending TSH If TSH normal, needs PPM Clear liquid diet at MN in case able to implant PPM tomorrow   #) T2DM On ozempic  with very little appetite  #) prostate ca with mets (R iliac) On xtandi , eligard      For questions or updates, please contact [facility] Please consult [email or web address] for contact info under Cardiology/STEMI.  Signed, Ajee Heasley, NP  09/18/2023 1:19 PM

## 2023-09-18 NOTE — Procedures (Signed)
 Extubation Procedure Note  Patient Details:   Name: Kyle George DOB: 1950/06/08 MRN: [id number]   Airway Documentation:    Vent end date: 09/18/23 Vent end time: 1124   Evaluation  O2 sats: stable throughout Complications: No apparent complications Patient did tolerate procedure well. Bilateral Breath Sounds: Diminished, Clear   Yes Patient was extubated to 4L [name].  Perfomed incentive spirometry of 500 mL with good effort.  [name] 09/18/2023, 11:28 AM

## 2023-09-18 NOTE — Progress Notes (Signed)
 Hypoglycemic Event  CBG: 64  Treatment: D50 25 mL (12.5 gm)  Symptoms: None  Follow-up CBG: Time:1538 CBG Result:98  Possible Reasons for Event: Inadequate meal intake      Okie Jansson B

## 2023-09-18 NOTE — Progress Notes (Signed)
 Rings given to daughter

## 2023-09-18 NOTE — Procedures (Signed)
 Patient Name: Kyle George  MRN: [id number]  Epilepsy Attending: Arlin MALVA Krebs  Referring Physician/Provider: Layman Raisin, DO  Date: 09/17/2023 Duration: 25.30 mins  Patient history: 73yo F s/p cardiac arrest. EEG to evaluate for seizure  Level of alertness: comatose  AEDs during EEG study: Propofol   Technical aspects: This EEG study was done with scalp electrodes positioned according to the 10-20 International system of electrode placement. Electrical activity was reviewed with band pass filter of 1-70Hz , sensitivity of 7 uV/mm, display speed of 45mm/sec with a 60Hz  notched filter applied as appropriate. EEG data were recorded continuously and digitally stored.  Video monitoring was available and reviewed as appropriate.  Description: EEG showed continuous generalized predominantly 5 to 9 Hz theta-alpha activity admixed with intermittent 2-3hz  delta slowing. Hyperventilation and photic stimulation were not performed.     ABNORMALITY - Continuous slow, generalized  IMPRESSION: This study is suggestive of moderate diffuse encephalopathy. No seizures or epileptiform discharges were seen throughout the recording.  Sennie Borden O Makaio Mach

## 2023-09-19 ENCOUNTER — Other Ambulatory Visit: Payer: Self-pay

## 2023-09-19 ENCOUNTER — Encounter (HOSPITAL_COMMUNITY): Payer: Self-pay | Admitting: Cardiology

## 2023-09-19 ENCOUNTER — Inpatient Hospital Stay (HOSPITAL_COMMUNITY)
Admission: EM | Disposition: A | Discharge status: Home / Self Care | Payer: Self-pay | Source: Ambulatory Visit | Attending: Internal Medicine

## 2023-09-19 DIAGNOSIS — R0789 Other chest pain: Secondary | ICD-10-CM | POA: Diagnosis not present

## 2023-09-19 DIAGNOSIS — N179 Acute kidney failure, unspecified: Secondary | ICD-10-CM | POA: Diagnosis not present

## 2023-09-19 DIAGNOSIS — I469 Cardiac arrest, cause unspecified: Secondary | ICD-10-CM | POA: Diagnosis not present

## 2023-09-19 DIAGNOSIS — J9601 Acute respiratory failure with hypoxia: Secondary | ICD-10-CM | POA: Diagnosis not present

## 2023-09-19 HISTORY — PX: PACEMAKER IMPLANT: EP1218

## 2023-09-19 LAB — BASIC METABOLIC PANEL WITH GFR
Anion gap: 9 (ref 5–15)
BUN: 28 mg/dL — ABNORMAL HIGH (ref 8–23)
CO2: 23 mmol/L (ref 22–32)
Calcium: 8.4 mg/dL — ABNORMAL LOW (ref 8.9–10.3)
Chloride: 105 mmol/L (ref 98–111)
Creatinine, Ser: 1.53 mg/dL — ABNORMAL HIGH (ref 0.61–1.24)
GFR, Estimated: 47 mL/min — ABNORMAL LOW (ref >60)
Glucose, Bld: 91 mg/dL (ref 70–99)
Potassium: 4.1 mmol/L (ref 3.5–5.1)
Sodium: 137 mmol/L (ref 135–145)

## 2023-09-19 LAB — CBC
HCT: 32.2 % — ABNORMAL LOW (ref 39.0–52.0)
HCT: 34.4 % — ABNORMAL LOW (ref 39.0–52.0)
Hemoglobin: 10.8 g/dL — ABNORMAL LOW (ref 13.0–17.0)
Hemoglobin: 11.2 g/dL — ABNORMAL LOW (ref 13.0–17.0)
MCH: 35.3 pg — ABNORMAL HIGH (ref 26.0–34.0)
MCH: 34.3 pg — ABNORMAL HIGH (ref 26.0–34.0)
MCHC: 33.5 g/dL (ref 30.0–36.0)
MCHC: 32.6 g/dL (ref 30.0–36.0)
MCV: 105.2 fL — ABNORMAL HIGH (ref 80.0–100.0)
MCV: 105.2 fL — ABNORMAL HIGH (ref 80.0–100.0)
Platelets: 137 K/uL — ABNORMAL LOW (ref 150–400)
Platelets: 145 K/uL — ABNORMAL LOW (ref 150–400)
RBC: 3.06 MIL/uL — ABNORMAL LOW (ref 4.22–5.81)
RBC: 3.27 MIL/uL — ABNORMAL LOW (ref 4.22–5.81)
RDW: 15.2 % (ref 11.5–15.5)
RDW: 15.3 % (ref 11.5–15.5)
WBC: 12.8 K/uL — ABNORMAL HIGH (ref 4.0–10.5)
WBC: 12.9 K/uL — ABNORMAL HIGH (ref 4.0–10.5)
nRBC: 0 % (ref 0.0–0.2)
nRBC: 0 % (ref 0.0–0.2)

## 2023-09-19 LAB — COMPREHENSIVE METABOLIC PANEL WITH GFR
ALT: 366 U/L — ABNORMAL HIGH (ref 0–44)
AST: 221 U/L — ABNORMAL HIGH (ref 15–41)
Albumin: 2.6 g/dL — ABNORMAL LOW (ref 3.5–5.0)
Alkaline Phosphatase: 53 U/L (ref 38–126)
Anion gap: 9 (ref 5–15)
BUN: 30 mg/dL — ABNORMAL HIGH (ref 8–23)
CO2: 23 mmol/L (ref 22–32)
Calcium: 8.4 mg/dL — ABNORMAL LOW (ref 8.9–10.3)
Chloride: 106 mmol/L (ref 98–111)
Creatinine, Ser: 1.58 mg/dL — ABNORMAL HIGH (ref 0.61–1.24)
GFR, Estimated: 46 mL/min — ABNORMAL LOW (ref >60)
Glucose, Bld: 80 mg/dL (ref 70–99)
Potassium: 4.1 mmol/L (ref 3.5–5.1)
Sodium: 138 mmol/L (ref 135–145)
Total Bilirubin: 1.1 mg/dL (ref 0.0–1.2)
Total Protein: 5.6 g/dL — ABNORMAL LOW (ref 6.5–8.1)

## 2023-09-19 LAB — GLUCOSE, CAPILLARY
Glucose-Capillary: 100 mg/dL — ABNORMAL HIGH (ref 70–99)
Glucose-Capillary: 90 mg/dL (ref 70–99)
Glucose-Capillary: 92 mg/dL (ref 70–99)
Glucose-Capillary: 85 mg/dL (ref 70–99)
Glucose-Capillary: 84 mg/dL (ref 70–99)
Glucose-Capillary: 86 mg/dL (ref 70–99)

## 2023-09-19 LAB — COOXEMETRY PANEL
Carboxyhemoglobin: 1.6 % — ABNORMAL HIGH (ref 0.5–1.5)
Methemoglobin: <0.7 % (ref 0.0–1.5)
O2 Saturation: 67.4 %
Total hemoglobin: 10.8 g/dL — ABNORMAL LOW (ref 12.0–16.0)

## 2023-09-19 LAB — HEPATIC FUNCTION PANEL
ALT: 370 U/L — ABNORMAL HIGH (ref 0–44)
AST: 251 U/L — ABNORMAL HIGH (ref 15–41)
Albumin: 2.4 g/dL — ABNORMAL LOW (ref 3.5–5.0)
Alkaline Phosphatase: 48 U/L (ref 38–126)
Bilirubin, Direct: 0.2 mg/dL (ref 0.0–0.2)
Indirect Bilirubin: 0.8 mg/dL (ref 0.3–0.9)
Total Bilirubin: 1 mg/dL (ref 0.0–1.2)
Total Protein: 5.4 g/dL — ABNORMAL LOW (ref 6.5–8.1)

## 2023-09-19 LAB — SURGICAL PCR SCREEN
MRSA, PCR: NEGATIVE
Staphylococcus aureus: NEGATIVE

## 2023-09-19 LAB — MAGNESIUM: Magnesium: 2.3 mg/dL (ref 1.7–2.4)

## 2023-09-19 SURGERY — PACEMAKER IMPLANT

## 2023-09-19 MED ORDER — CHLORHEXIDINE GLUCONATE 4 % EX SOLN
60.0000 mL | Freq: Once | CUTANEOUS | Status: AC
  Administered 2023-09-19: 4 via TOPICAL
  Filled 2023-09-19: qty 60

## 2023-09-19 MED ORDER — HYDROMORPHONE HCL 1 MG/ML IJ SOLN
0.5000 mg | INTRAMUSCULAR | Status: DC | PRN
  Administered 2023-09-19: 0.5 mg via INTRAVENOUS
  Filled 2023-09-19: qty 0.5

## 2023-09-19 MED ORDER — FENTANYL CITRATE (PF) 100 MCG/2ML IJ SOLN
INTRAMUSCULAR | Status: DC | PRN
  Administered 2023-09-19 (×3): 25 ug via INTRAVENOUS

## 2023-09-19 MED ORDER — CEFAZOLIN SODIUM-DEXTROSE 2-4 GM/100ML-% IV SOLN
INTRAVENOUS | Status: AC
  Filled 2023-09-19: qty 100

## 2023-09-19 MED ORDER — MIDAZOLAM HCL 5 MG/5ML IJ SOLN
INTRAMUSCULAR | Status: DC | PRN
  Administered 2023-09-19: 1 mg via INTRAVENOUS
  Administered 2023-09-19: .5 mg via INTRAVENOUS

## 2023-09-19 MED ORDER — HYDROMORPHONE HCL 1 MG/ML IJ SOLN
0.5000 mg | INTRAMUSCULAR | Status: DC | PRN
Start: 2023-09-19 — End: 2023-09-22
  Administered 2023-09-20: 0.5 mg via INTRAVENOUS
  Filled 2023-09-19: qty 0.5
  Filled 2023-09-19: qty 1

## 2023-09-19 MED ORDER — METHOCARBAMOL 500 MG PO TABS
500.0000 mg | ORAL_TABLET | Freq: Three times a day (TID) | ORAL | Status: DC
  Administered 2023-09-19 – 2023-09-22 (×10): 500 mg via ORAL
  Filled 2023-09-19 (×10): qty 1

## 2023-09-19 MED ORDER — SODIUM CHLORIDE 0.9 % IV SOLN: INTRAVENOUS | Status: DC

## 2023-09-19 MED ORDER — OXYCODONE HCL 5 MG PO TABS
5.0000 mg | ORAL_TABLET | ORAL | Status: DC | PRN
Start: 2023-09-19 — End: 2023-09-22
  Administered 2023-09-19 – 2023-09-21 (×3): 5 mg via ORAL
  Filled 2023-09-19 (×3): qty 1

## 2023-09-19 MED ORDER — HEPARIN (PORCINE) IN NACL 1000-0.9 UT/500ML-% IV SOLN
INTRAVENOUS | Status: DC | PRN
  Administered 2023-09-19: 500 mL

## 2023-09-19 MED ORDER — SODIUM CHLORIDE 0.9 % IV SOLN
80.0000 mg | INTRAVENOUS | Status: AC
  Administered 2023-09-19: 80 mg
  Filled 2023-09-19: qty 2

## 2023-09-19 MED ORDER — FENTANYL CITRATE (PF) 100 MCG/2ML IJ SOLN
INTRAMUSCULAR | Status: AC
  Filled 2023-09-19: qty 2

## 2023-09-19 MED ORDER — SODIUM CHLORIDE 0.9% FLUSH
3.0000 mL | Freq: Two times a day (BID) | INTRAVENOUS | Status: DC
  Administered 2023-09-19 (×2): 3 mL via INTRAVENOUS

## 2023-09-19 MED ORDER — INSULIN ASPART 100 UNIT/ML IJ SOLN
0.0000 [IU] | Freq: Three times a day (TID) | INTRAMUSCULAR | Status: DC
  Administered 2023-09-20: 1 [IU] via SUBCUTANEOUS
  Administered 2023-09-20: 2 [IU] via SUBCUTANEOUS
  Administered 2023-09-21: 1 [IU] via SUBCUTANEOUS

## 2023-09-19 MED ORDER — SODIUM CHLORIDE 0.9 % IV SOLN: 250.0000 mL | INTRAVENOUS | Status: DC

## 2023-09-19 MED ORDER — SODIUM CHLORIDE 0.9% FLUSH: 3.0000 mL | INTRAVENOUS | Status: DC | PRN

## 2023-09-19 MED ORDER — ONDANSETRON HCL 4 MG/2ML IJ SOLN: 4.0000 mg | Freq: Four times a day (QID) | INTRAMUSCULAR | Status: DC | PRN

## 2023-09-19 MED ORDER — LIDOCAINE 5 % EX PTCH
1.0000 | MEDICATED_PATCH | CUTANEOUS | Status: DC
  Administered 2023-09-19 – 2023-09-22 (×4): 1 via TRANSDERMAL
  Filled 2023-09-19 (×4): qty 1

## 2023-09-19 MED ORDER — SENNOSIDES-DOCUSATE SODIUM 8.6-50 MG PO TABS
2.0000 | ORAL_TABLET | Freq: Every day | ORAL | Status: DC
  Administered 2023-09-19 – 2023-09-21 (×3): 2 via ORAL
  Filled 2023-09-19 (×3): qty 2

## 2023-09-19 MED ORDER — MIDAZOLAM HCL 2 MG/2ML IJ SOLN
INTRAMUSCULAR | Status: AC
  Filled 2023-09-19: qty 2

## 2023-09-19 MED ORDER — POLYETHYLENE GLYCOL 3350 17 G PO PACK
17.0000 g | PACK | Freq: Two times a day (BID) | ORAL | Status: DC
  Administered 2023-09-19 – 2023-09-21 (×6): 17 g via ORAL
  Filled 2023-09-19 (×6): qty 1

## 2023-09-19 MED ORDER — ACETAMINOPHEN 325 MG PO TABS
650.0000 mg | ORAL_TABLET | Freq: Three times a day (TID) | ORAL | Status: DC
  Administered 2023-09-19 – 2023-09-22 (×10): 650 mg via ORAL
  Filled 2023-09-19 (×10): qty 2

## 2023-09-19 MED ORDER — LIDOCAINE HCL (PF) 1 % IJ SOLN
INTRAMUSCULAR | Status: DC | PRN
  Administered 2023-09-19: 60 mL

## 2023-09-19 MED ORDER — CEFAZOLIN SODIUM-DEXTROSE 2-4 GM/100ML-% IV SOLN
2.0000 g | INTRAVENOUS | Status: AC
  Administered 2023-09-19: 2 g via INTRAVENOUS

## 2023-09-19 MED ORDER — AMOXICILLIN-POT CLAVULANATE 875-125 MG PO TABS
1.0000 | ORAL_TABLET | Freq: Two times a day (BID) | ORAL | Status: DC
Start: 2023-09-19 — End: 2023-09-23
  Administered 2023-09-19 – 2023-09-22 (×7): 1 via ORAL
  Filled 2023-09-19 (×8): qty 1

## 2023-09-19 MED ORDER — LIDOCAINE HCL 1 % IJ SOLN
INTRAMUSCULAR | Status: AC
Start: 2023-09-19 — End: 2023-09-19
  Filled 2023-09-19: qty 60

## 2023-09-19 MED ORDER — SODIUM CHLORIDE 0.9 % IV SOLN
INTRAVENOUS | Status: AC
  Filled 2023-09-19: qty 2

## 2023-09-19 SURGICAL SUPPLY — 10 items
CABLE SURGICAL S-101-97-12 (CABLE) ×1 IMPLANT
CATH RIGHTSITE C315HIS02 (CATHETERS) IMPLANT
IPG PACE AZUR XT DR MRI W1DR01 (Pacemaker) IMPLANT
LEAD CAPSURE NOVUS 5076-52CM (Lead) IMPLANT
LEAD SELECT SECURE 3830 383069 (Lead) IMPLANT
PAD DEFIB RADIO PHYSIO CONN (PAD) ×1 IMPLANT
SHEATH 7FR PRELUDE SNAP 13 (SHEATH) IMPLANT
SLITTER 6232ADJ (MISCELLANEOUS) IMPLANT
TRAY PACEMAKER INSERTION (PACKS) ×1 IMPLANT
WIRE HI TORQ VERSACORE-J 145CM (WIRE) IMPLANT

## 2023-09-19 NOTE — Interval H&P Note (Signed)
 History and Physical Interval Note:  09/19/2023 3:47 PM  Kyle George  has presented today for surgery, with the diagnosis of heart block.  The various methods of treatment have been discussed with the patient and family. After consideration of risks, benefits and other options for treatment, the patient has consented to  Procedure(s): PACEMAKER IMPLANT (N/A) as a surgical intervention.  The patient's history has been reviewed, patient examined, no change in status, stable for surgery.  I have reviewed the patient's chart and labs.  Questions were answered to the patient's satisfaction.     Maranatha Grossi [employer]

## 2023-09-19 NOTE — Progress Notes (Signed)
 PT Cancellation Note  Patient Details Name: Kyle George MRN: [id number] DOB: 11-14-1950   Cancelled Treatment:    Reason Eval/Treat Not Completed: Patient not medically ready (Pt currently with external pacemaker; also significant chest pain. Scheduled for PPM today. Will follow up as able and appropriate)  Dorothyann Maier, DPT, CLT  Acute Rehabilitation Services Office: [phone] (Secure chat preferred)   Dorothyann VEAR Maier 09/19/2023, 12:03 PM

## 2023-09-19 NOTE — Progress Notes (Signed)
    Heart failure team will sign off as of 09/19/23  [facility] Care will pick up  09/20/2023   Follow up as an outpatient in the HF clinic ?   No  If no please follow up with [facility]  after discharge.  Myan Suit NP-C  5:26 PM

## 2023-09-19 NOTE — Progress Notes (Signed)
 Advanced Heart Failure Rounding Note  Cardiologist: None  Chief Complaint: Chest Sort after CPR Subjective:    9/9 CHB, Temp Pacer placed. On Pressors later weaned. Lidoccaine/Isuprel  stopped. Extubated.   TVP set at 90. Underlying rhythm CHB.    Complaining of chest being sore. Denies SOB.      Objective:   Weight Range: 72.9 kg Body mass index is 25.17 kg/m.   Vital Signs:   Temp:  [97.5 F (36.4 C)-99.5 F (37.5 C)] 99.5 F (37.5 C) (09/10 0900) Pulse Rate:  [87-90] 89 (09/10 0945) Resp:  [11-37] 16 (09/10 0945) BP: (90-140)/(60-90) 101/76 (09/10 0900) SpO2:  [91 %-100 %] 93 % (09/10 0945) Arterial Line BP: (83-170)/(46-79) 131/69 (09/10 0945) FiO2 (%):  [40 %] 40 % (09/09 1039) Last BM Date :  (PTA)  Weight change: Filed Weights   09/17/23 1601 09/17/23 2145  Weight: 75 kg 72.9 kg    Intake/Output:   Intake/Output Summary (Last 24 hours) at 09/19/2023 0955 Last data filed at 09/19/2023 0900 Gross per 24 hour  Intake 993.57 ml  Output 2775 ml  Net -1781.43 ml     CVp 7-8 Physical Exam    General:  In bed. No resp difficulty Neck: no JVD.  Cor: Regular rate & rhythm. Temp Pacer RIJ Lungs: clear Abdomen: soft, nontender, nondistended.  Extremities: no  edema Neuro: alert & oriented x3 GU: Foley urine yellow    Telemetry  V Paced 90    EKG    N/A  Labs    CBC Recent Labs    09/17/23 1327 09/17/23 1605 09/17/23 1814 09/17/23 2215 09/18/23 0125 09/19/23 0434  WBC 6.9  --  6.7   < > 19.7* 12.8*  NEUTROABS 4.6  --  4.6  --   --   --   HGB 14.1   < > 13.4   < > 12.5* 10.8*  HCT 42.4   < > 40.3   < > 39.6 32.2*  MCV 107.1*   < > 106.6*   < > 107.9* 105.2*  PLT 202   < > 186   < > 219 137*   < > = values in this interval not displayed.   Basic Metabolic Panel Recent Labs    90/90/74 0125 09/18/23 0323 09/19/23 0434  NA 139 138 137  K 3.3* 3.2* 4.1  CL 104 104 105  CO2 17* 17* 23  GLUCOSE 339* 344* 91  BUN 24* 26* 28*   CREATININE 1.71* 1.69* 1.53*  CALCIUM  8.7* 8.5* 8.4*  MG 1.6*  --  2.3   Liver Function Tests Recent Labs    09/18/23 0125 09/19/23 0434  AST 869* 251*  ALT 659* 370*  ALKPHOS 50 48  BILITOT 0.6 1.0  PROT 6.0* 5.4*  ALBUMIN 2.8* 2.4*   No results for input(s): LIPASE, AMYLASE in the last 72 hours. Cardiac Enzymes No results for input(s): CKTOTAL, CKMB, CKMBINDEX, TROPONINI in the last 72 hours.  BNP: BNP (last 3 results) No results for input(s): BNP in the last 8760 hours.  ProBNP (last 3 results) No results for input(s): PROBNP in the last 8760 hours.   D-Dimer No results for input(s): DDIMER in the last 72 hours. Hemoglobin A1C No results for input(s): HGBA1C in the last 72 hours. Fasting Lipid Panel Recent Labs    09/18/23 0323  TRIG 68   Thyroid  Function Tests Recent Labs    09/18/23 0125  TSH 2.015    Other results:   Imaging  ECHOCARDIOGRAM COMPLETE Result Date: 09/18/2023    ECHOCARDIOGRAM REPORT   Patient Name:   SHAVON ZENZ Date of Exam: 09/18/2023 Medical Rec #:  [id number]      Height:       67.0 in Accession #:    [id number]     Weight:       160.7 lb Date of Birth:  1950/11/27      BSA:          1.843 m Patient Age:    73 years       BP:           92/75 mmHg Patient Gender: M              HR:           89 bpm. Exam Location:  Inpatient Procedure: 2D Echo, Cardiac Doppler and Color Doppler (Both Spectral and Color            Flow Doppler were utilized during procedure). Indications:    R94.31 Abnormal EKG. Cardiac arrest.  History:        Patient has no prior history of Echocardiogram examinations.                 Abnormal ECG, Arrythmias:RBBB; Risk Factors:Hypertension,                 Diabetes and Dyslipidemia. Cancer.  Sonographer:    Ellouise Mose RDCS Referring Phys: [id number] JESSICA MARSHALL  Sonographer Comments: Echo performed with patient supine and on artificial respirator. IMPRESSIONS  1. Left ventricular ejection fraction,  by estimation, is 55 to 60%. The left ventricle has normal function. The left ventricle has no regional wall motion abnormalities. There is moderate concentric left ventricular hypertrophy of the septal segment. Indeterminate diastolic filling due to E-A fusion.  2. Right ventricular systolic function is normal. The right ventricular size is normal. There is mildly elevated pulmonary artery systolic pressure. The estimated right ventricular systolic pressure is 42.5 mmHg.  3. The mitral valve is normal in structure. Mild mitral valve regurgitation. No evidence of mitral stenosis.  4. Tricuspid valve regurgitation is moderate to severe.  5. The aortic valve is tricuspid. There is mild calcification of the aortic valve. There is mild thickening of the aortic valve. Aortic valve regurgitation is not visualized. Aortic valve sclerosis/calcification is present, without any evidence of aortic stenosis. Comparison(s): No prior Echocardiogram. FINDINGS  Left Ventricle: Temp pacer in place. Left ventricular ejection fraction, by estimation, is 55 to 60%. The left ventricle has normal function. The left ventricle has no regional wall motion abnormalities. The left ventricular internal cavity size was normal in size. There is moderate concentric left ventricular hypertrophy of the septal segment. Abnormal (paradoxical) septal motion, consistent with RV pacemaker. Indeterminate diastolic filling due to E-A fusion. Right Ventricle: The right ventricular size is normal. No increase in right ventricular wall thickness. Right ventricular systolic function is normal. There is mildly elevated pulmonary artery systolic pressure. The tricuspid regurgitant velocity is 2.85  m/s, and with an assumed right atrial pressure of 10 mmHg, the estimated right ventricular systolic pressure is 42.5 mmHg. Left Atrium: Left atrial size was normal in size. Right Atrium: Right atrial size was normal in size. Pericardium: There is no evidence of  pericardial effusion. Mitral Valve: The mitral valve is normal in structure. Mild mitral valve regurgitation. No evidence of mitral valve stenosis. Tricuspid Valve: The tricuspid valve is normal in structure. Tricuspid valve regurgitation is moderate to severe. No  evidence of tricuspid stenosis. Aortic Valve: The aortic valve is tricuspid. There is mild calcification of the aortic valve. There is mild thickening of the aortic valve. Aortic valve regurgitation is not visualized. Aortic valve sclerosis/calcification is present, without any evidence of aortic stenosis. Pulmonic Valve: The pulmonic valve was normal in structure. Pulmonic valve regurgitation is not visualized. No evidence of pulmonic stenosis. Aorta: The aortic root is normal in size and structure. Venous: IVC assessment for right atrial pressure unable to be performed due to mechanical ventilation. IAS/Shunts: No atrial level shunt detected by color flow Doppler. Additional Comments: A device lead is visualized in the right ventricle.  LEFT VENTRICLE PLAX 2D LVIDd:         3.80 cm     Diastology LVIDs:         2.38 cm     LV e' medial:    7.94 cm/s LV PW:         1.33 cm     LV E/e' medial:  12.5 LV IVS:        1.51 cm     LV e' lateral:   7.72 cm/s LVOT diam:     2.32 cm     LV E/e' lateral: 12.9 LV SV:         82 LV SV Index:   44 LVOT Area:     4.23 cm  LV Volumes (MOD) LV vol d, MOD A2C: 83.0 ml LV vol d, MOD A4C: 92.6 ml LV vol s, MOD A2C: 38.7 ml LV vol s, MOD A4C: 38.9 ml LV SV MOD A2C:     44.3 ml LV SV MOD A4C:     92.6 ml LV SV MOD BP:      51.2 ml RIGHT VENTRICLE             IVC RV S prime:     14.90 cm/s  IVC diam: 2.38 cm TAPSE (M-mode): 2.1 cm LEFT ATRIUM           Index       RIGHT ATRIUM           Index LA diam:      3.49 cm 1.89 cm/m  RA Area:     15.80 cm LA Vol (A2C): 11.3 ml 6.13 ml/m  RA Volume:   38.50 ml  20.89 ml/m LA Vol (A4C): 15.6 ml 8.47 ml/m  AORTIC VALVE LVOT Vmax:   122.00 cm/s LVOT Vmean:  79.900 cm/s LVOT VTI:     0.193 m  AORTA Ao Root diam: 3.90 cm MITRAL VALVE                TRICUSPID VALVE MV Area (PHT): 5.07 cm     TR Peak grad:   32.5 mmHg MV Decel Time: 150 msec     TR Vmax:        285.00 cm/s MV E velocity: 99.35 cm/s MV A velocity: 132.00 cm/s  SHUNTS MV E/A ratio:  0.75         Systemic VTI:  0.19 m                             Systemic Diam: 2.32 cm Jerel Croitoru MD Electronically signed by Jerel Balding MD Signature Date/Time: 09/18/2023/1:17:16 PM    Final      Medications:     Scheduled Medications:  acetaminophen   650 mg Oral Q8H   amoxicillin -clavulanate  1 tablet Oral Q12H  arformoterol   15 mcg Nebulization BID   budesonide  (PULMICORT ) nebulizer solution  0.25 mg Nebulization BID   Chlorhexidine  Gluconate Cloth  6 each Topical Daily   gentamicin  (GARAMYCIN ) 80 mg in sodium chloride  0.9 % 500 mL irrigation  80 mg Irrigation On Call   heparin   5,000 Units Subcutaneous Q8H   insulin  aspart  0-6 Units Subcutaneous TID WC   lidocaine   1 patch Transdermal Q24H   lidocaine   1 patch Transdermal Q24H   methocarbamol   500 mg Oral TID   polyethylene glycol  17 g Oral BID   senna-docusate  2 tablet Oral QHS   sodium chloride  flush  3 mL Intravenous Q12H    Infusions:  sodium chloride      sodium chloride       ceFAZolin  (ANCEF ) IV     magnesium  sulfate Stopped (09/18/23 0514)    PRN Medications: acetaminophen  **OR** [DISCONTINUED] acetaminophen  (TYLENOL ) oral liquid 160 mg/5 mL **OR** [DISCONTINUED] acetaminophen , HYDROmorphone  (DILAUDID ) injection, Influenza vac split trivalent PF, magnesium  sulfate, mouth rinse, oxyCODONE , sodium chloride  flush, sodium chloride  flush    Patient Profile  Mr Weygandt is a 74 year old with history of metastatic prostate cancer, bone metastasis, HTN, and DM.    Admitted to [facility] from [facility] with cardiac arrest, VF, and CHB   Assessment/Plan  Cardiac Arrest, VF, Cardiogenic Shock, CHB VF arrest shock 4 with ROSC. Intubated . Placed on amiodarone  and  lidocaine . Later developed bradycardia. Amiodarone  stopped.  Lactic acid 8-->1.3 Temp pacer placed with hemodynamic improvement. Pressors discontinued CVP stable.  Echo LV 55-60% RV normal .  Avoid nodal blockers.  EP consulted plan for pace maker later today.     Lactic acidosis In the setting of arrest. Lactic acid 8-->1.3   Acute Hypoxic Respiratory Failure  Per CCM. Intubated at [facility] during arrest. Placed on Unasyn  for possible aspiration.  Extubated 9/9  O2 sats stable.    Transaminitis Suspect in the setting of shock. Trending down.    AKI  Renal function improving.   EP plan for Pacer today.   Length of Stay: 2  Greig Mosses, NP  09/19/2023, 9:55 AM  Advanced Heart Failure Team Pager [phone] (M-F; 7a - 5p)  Please contact [facility] for night-coverage after hours (5p -7a ) and weekends on [email or web address]

## 2023-09-19 NOTE — Progress Notes (Addendum)
  Patient Name: Kyle George Date of Encounter: 09/19/2023  Primary Cardiologist: None Electrophysiologist: None  Interval Summary   Having significant chest pain/discomfort overnight, relieved with PRN narcotics.    Vital Signs    Vitals:   09/19/23 0300 09/19/23 0400 09/19/23 0500 09/19/23 0810  BP: 117/78 100/67 108/66 102/74  Pulse: 88 89 89   Resp: (!) 37 (!) 23 19 20   Temp:  99.4 F (37.4 C)    TempSrc:  Oral    SpO2: 96% 95% 95%   Weight:      Height:        Intake/Output Summary (Last 24 hours) at 09/19/2023 0834 Last data filed at 09/19/2023 0700 Gross per 24 hour  Intake 1020.38 ml  Output 2600 ml  Net -1579.62 ml   Filed Weights   09/17/23 1601 09/17/23 2145  Weight: 75 kg 72.9 kg    Physical Exam    GEN- NAD, Alert and oriented  Lungs- Clear to ausculation bilaterally, normal work of breathing, [place] in plase Cardiac- Regular rate and rhythm, no murmurs, rubs or gallops GI- soft, NT, ND, + BS Extremities- no clubbing or cyanosis. No edema  Telemetry    VP at 90 CHB in 40s with incessant PVC underneath (personally reviewed)  Hospital Course    Kyle George is a 73 y.o. male with PMH of prostate ca w bone mets, HTN, DM admitted for CHB complicated by VF arrest  Assessment & Plan    #) PMVT arrest #) CHB #) cardiac arrest s/p chest compressions; chest discomfort #) anemia  S/p temp wire placement, remains in CHB with incessant PVCs Threshold 0.6 HgB dropped 12.5 > 10.8, update CBC mid-morning to reassess With ongoing chest pain, will update CXR Remains on 4L [place] with good sats Tentitively plan for PPM implant this afternoon, pending updated labs and stable CXR Clear liquid at this time for procedure  Reviewed risks/benefits of procedure with patient and family, who are agreeable    For questions or updates, please contact [facility] Please consult [email or web address] for contact info under     Signed, Keionte Swicegood, NP   09/19/2023, 8:34 AM      Patient seen and examined with Dorsie Sethi.   No significant overnight events other than chest pain status post CPR.  No obvious rib fractures on CXR yesterday.  Continues to be paced 100% of the time with RIJ temp wire.  Underlying escape in the 50s with complete heart block.  Occasional PVCs directly on T wave when pacing inhibited.  Threshold 0.6 mA.   General: Conversant, upright in bed, on 4 L [place] sats 95% Cardiac: RRR, no M/R/G, S1/S2+ Pulmonary: CTAB Skin: RIJ T wire, LIJ CVL  Labs notable for anemia (Hb 10.8 from 12.5), mild leukocytosis (WBC 12.8). AKI and LFTs elevated but down trending. Will repeat labs later this morning but plan to proceed with LEFT DC/LBaP PPM implant later today. 4 L  stable and mainly related to chest pressure s/p CPR and pain with deep inspiration.  Please remove left IJ CVL in preparation for device placement.  Kyle DELENA Primus, MD [facility]  Cardiac Electrophysiology

## 2023-09-19 NOTE — Progress Notes (Signed)
 NAME:  Kyle George, MRN:  [id number], DOB:  28-Jan-1950, LOS: 2 ADMISSION DATE:  09/17/2023, CONSULTATION DATE:  9/8 REFERRING MD: EDP, CHIEF COMPLAINT:  VF cardiac arrest   History of Present Illness:  73 yo male presented from the cancer center today with asymptomatic bradycardia. He initially was in the ED awake and alert, talking, but suffered a VF arrest. He required defibrillation x 4, amiodarone  to remain out of VF. He is bradycardic with rates in the 50s on amiodarone  and epinephrine  infusions. He was intubated during arrest. Cardiology is being consulted. PCCM was consulted for admission to [facility].  All history is obtained from chart and quite limited, as pt is intubated at this time.  Lactate >7 pH 7.1, mag and potassium all within normal range.  Pt arrived to our icu bradycardic and irreg. Rate in 30's on lidocaine /propofol  and 10 of epi. Cardiology pending their evaluation as they had also ordered amio but already on lido and already with HR in 30's.   Pertinent  Medical History  Stage IV prostate cancer HTN HLD DM  Significant Hospital Events: Including procedures, antibiotic start and stop dates in addition to other pertinent events   9/8 VF cardiac arrest after bradycardia 9/10 extubated yesterday, off pressors, Lido and inotropes, temp wire in place, EP planning PPM  Interim History / Subjective:  Stable since extubation yesterday, temp pacing wire in place with plan for PPM today Off all pressors and inotropes C/o chest wall pain after compressions 4L [place] CVP 3-5 2.3L UOP yesterday, +3L since admission [place] 67%   Objective    Blood pressure 108/66, pulse 89, temperature 99.4 F (37.4 C), temperature source Oral, resp. rate 19, height 5' 7 (1.702 m), weight 72.9 kg, SpO2 95%. CVP:  [0 mmHg-8 mmHg] 4 mmHg  Vent Mode: PSV;CPAP FiO2 (%):  [40 %] 40 % Set Rate:  [20 bmp] 20 bmp Vt Set:  [520 mL] 520 mL PEEP:  [5 cmH20-8 cmH20] 5 cmH20 Pressure Support:  [5 cmH20] 5  cmH20 Plateau Pressure:  [17 cmH20] 17 cmH20   Intake/Output Summary (Last 24 hours) at 09/19/2023 0748 Last data filed at 09/19/2023 0400 Gross per 24 hour  Intake 1371.4 ml  Output 2175 ml  Net -803.6 ml   Filed Weights   09/17/23 1601 09/17/23 2145  Weight: 75 kg 72.9 kg    General:  thin elderly M resting in bed in NAD  HEENT: MM pink/moist Neuro: alert and oriented, moving all extremities  CV: s1s2 paced rhythm, no m/r/g PULM:  clear bilaterally without tachypnea or distress on 4L [place] GI: soft, non-tender to palpation  Extremities: warm/dry, no edema       Resolved problem list   Lactic acidosis due to hypoperfusion Acute encephalopathy post-arrest  Assessment and Plan   VF cardiac arrest; suspect was either electrolyte or R on T related CHB -continue TVP, EP consulted and plan for PPM later today -off epi and isoproterenol  with MAPs >65 -correct electrolytes- K+, Mg+, Ca+ prn      Acute respiratory failure with hypoxia requiring MV RUL opacity, suspect aspiration Extubated successfully 9/9, stable on [place] -add unasyn  for likely aspiration and transition to Augmentin  tomorrow  AKI  likely ATN from cardiac arrest Creatinine now down-trending 1.7>1.6>1.5 with over 2L UOP yesterday -strict I/O -renally dose meds, avoid nephrotoxins -maintain adequate perfusion  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  -OP follow up as previously scheduled -holding PTA Xtandi   Hyperglycemia post arrest; improving DM; A1c  5.3 -SSI, change to senstive  -goal BG 140-180 -hold PTA ozempic , Actos , metformin  (likely doesn't still need actos  at discharge with A1c)   Hypokalemia Hypocalcemia Hypomagnesemia -monitor and replete prn   Acute anemia- potentially dilutional Hgb drift from 12.5 to 10.8 -transfuse for Hb <7 or hemodynamically significant bleeding  H/o HTN -hold PTA olmesartan , hydrochlorothiazide   Acute Chest Wall Pain -add lidocaine , robaxin  and tylenol     Son updated at bedside during rounds with Dr. Gretta.  Labs   CBC: Recent Labs  Lab 09/17/23 1327 09/17/23 1605 09/17/23 1814 09/17/23 2215 09/17/23 2316 09/18/23 0125 09/19/23 0434  WBC 6.9  --  6.7  --  20.7* 19.7* 12.8*  NEUTROABS 4.6  --  4.6  --   --   --   --   HGB 14.1 14.1 13.4 12.9* 12.8* 12.5* 10.8*  HCT 42.4 42.8 40.3 38.0* 40.0 39.6 32.2*  MCV 107.1* 108.1* 106.6*  --  109.6* 107.9* 105.2*  PLT 202 188 186  --  231 219 137*    Basic Metabolic Panel: Recent Labs  Lab 09/17/23 1327 09/17/23 1814 09/17/23 2215 09/17/23 2316 09/18/23 0125 09/18/23 0323 09/19/23 0434  NA 141 139 138  --  139 138 137  K 3.9 3.8 3.3*  --  3.3* 3.2* 4.1  CL 104 104  --   --  104 104 105  CO2 23 23  --   --  17* 17* 23  GLUCOSE 70 74  --   --  339* 344* 91  BUN 19 17  --   --  24* 26* 28*  CREATININE 1.02 0.93  --  1.44* 1.71* 1.69* 1.53*  CALCIUM  8.9 8.6*  --   --  8.7* 8.5* 8.4*  MG  --  1.9  --   --  1.6*  --  2.3   GFR: Estimated Creatinine Clearance: 39.6 mL/min (A) (by C-G formula based on SCr of 1.53 mg/dL (H)). Recent Labs  Lab 09/17/23 1814 09/17/23 1905 09/17/23 2316 09/18/23 0125 09/18/23 0323 09/18/23 0938 09/18/23 1305 09/19/23 0434  WBC 6.7  --  20.7* 19.7*  --   --   --  12.8*  LATICACIDVEN  --    < > 7.9* 8.1* 5.9* 2.3* 1.3  --    < > = values in this interval not displayed.    Liver Function Tests: Recent Labs  Lab 09/17/23 1327 09/17/23 1814 09/18/23 0125 09/19/23 0434  AST 17 15 869* 251*  ALT 10 10 659* 370*  ALKPHOS 48 43 50 48  BILITOT 0.6 0.6 0.6 1.0  PROT 7.2 6.6 6.0* 5.4*  ALBUMIN 3.5 3.2* 2.8* 2.4*   Critical care time:  32 minutes    CRITICAL CARE Performed by: Leita SAUNDERS Arsen Mangione   Total critical care time: 32 minutes  Critical care time was exclusive of separately billable procedures and treating other patients.  Critical care was necessary to treat or prevent imminent or life-threatening deterioration.  Critical care  was time spent personally by me on the following activities: development of treatment plan with patient and/or surrogate as well as nursing, discussions with consultants, evaluation of patient's response to treatment, examination of patient, obtaining history from patient or surrogate, ordering and performing treatments and interventions, ordering and review of laboratory studies, ordering and review of radiographic studies, pulse oximetry and re-evaluation of patient's condition.   Leita SAUNDERS Madline Oesterling, PA-C [facility] See Amion for pager If no response to pager , please call [phone] until 7pm After 7:00 pm call Elink  [phone]

## 2023-09-19 NOTE — Progress Notes (Signed)
 Stable post-PPM. D/w RN, planning to transfer to the floor tonight. D/c RIJ introducer first.  Leita SHAUNNA Gaskins, DO 09/19/23 7:11 PM [facility]  For contact information, see Amion. If no response to pager, please call PCCM consult pager. After hours, 7PM- 7AM, please call Elink.

## 2023-09-19 NOTE — Progress Notes (Signed)
 OT Cancellation Note  Patient Details Name: Kyle George MRN: [id number] DOB: [birth year]   Cancelled Treatment:    Reason Eval/Treat Not Completed: Patient not medically ready (pending 1500 procedure with pain currently)  Ely Molt 09/19/2023, 1:20 PM

## 2023-09-20 ENCOUNTER — Inpatient Hospital Stay (HOSPITAL_COMMUNITY)

## 2023-09-20 ENCOUNTER — Encounter (HOSPITAL_COMMUNITY): Payer: Self-pay | Admitting: Cardiology

## 2023-09-20 ENCOUNTER — Telehealth: Payer: Self-pay | Admitting: Critical Care Medicine

## 2023-09-20 DIAGNOSIS — C61 Malignant neoplasm of prostate: Secondary | ICD-10-CM | POA: Diagnosis not present

## 2023-09-20 DIAGNOSIS — J9601 Acute respiratory failure with hypoxia: Secondary | ICD-10-CM | POA: Diagnosis not present

## 2023-09-20 DIAGNOSIS — I469 Cardiac arrest, cause unspecified: Secondary | ICD-10-CM | POA: Diagnosis not present

## 2023-09-20 DIAGNOSIS — E782 Mixed hyperlipidemia: Secondary | ICD-10-CM | POA: Diagnosis not present

## 2023-09-20 DIAGNOSIS — E0800 Diabetes mellitus due to underlying condition with hyperosmolarity without nonketotic hyperglycemic-hyperosmolar coma (NKHHC): Secondary | ICD-10-CM

## 2023-09-20 DIAGNOSIS — J441 Chronic obstructive pulmonary disease with (acute) exacerbation: Secondary | ICD-10-CM

## 2023-09-20 DIAGNOSIS — J690 Pneumonitis due to inhalation of food and vomit: Secondary | ICD-10-CM

## 2023-09-20 LAB — CBC
HCT: 32.1 % — ABNORMAL LOW (ref 39.0–52.0)
Hemoglobin: 10.4 g/dL — ABNORMAL LOW (ref 13.0–17.0)
MCH: 34.2 pg — ABNORMAL HIGH (ref 26.0–34.0)
MCHC: 32.4 g/dL (ref 30.0–36.0)
MCV: 105.6 fL — ABNORMAL HIGH (ref 80.0–100.0)
Platelets: 129 K/uL — ABNORMAL LOW (ref 150–400)
RBC: 3.04 MIL/uL — ABNORMAL LOW (ref 4.22–5.81)
RDW: 15 % (ref 11.5–15.5)
WBC: 9.7 K/uL (ref 4.0–10.5)
nRBC: 0 % (ref 0.0–0.2)

## 2023-09-20 LAB — BASIC METABOLIC PANEL WITH GFR
Anion gap: 13 (ref 5–15)
BUN: 27 mg/dL — ABNORMAL HIGH (ref 8–23)
CO2: 20 mmol/L — ABNORMAL LOW (ref 22–32)
Calcium: 7.9 mg/dL — ABNORMAL LOW (ref 8.9–10.3)
Chloride: 106 mmol/L (ref 98–111)
Creatinine, Ser: 1.17 mg/dL (ref 0.61–1.24)
GFR, Estimated: >60 mL/min (ref >60)
Glucose, Bld: 83 mg/dL (ref 70–99)
Potassium: 3.9 mmol/L (ref 3.5–5.1)
Sodium: 139 mmol/L (ref 135–145)

## 2023-09-20 LAB — HEPATIC FUNCTION PANEL
ALT: 268 U/L — ABNORMAL HIGH (ref 0–44)
AST: 129 U/L — ABNORMAL HIGH (ref 15–41)
Albumin: 2.5 g/dL — ABNORMAL LOW (ref 3.5–5.0)
Alkaline Phosphatase: 78 U/L (ref 38–126)
Bilirubin, Direct: 0.2 mg/dL (ref 0.0–0.2)
Indirect Bilirubin: 0.7 mg/dL (ref 0.3–0.9)
Total Bilirubin: 0.9 mg/dL (ref 0.0–1.2)
Total Protein: 5.5 g/dL — ABNORMAL LOW (ref 6.5–8.1)

## 2023-09-20 LAB — GLUCOSE, CAPILLARY
Glucose-Capillary: 118 mg/dL — ABNORMAL HIGH (ref 70–99)
Glucose-Capillary: 138 mg/dL — ABNORMAL HIGH (ref 70–99)
Glucose-Capillary: 238 mg/dL — ABNORMAL HIGH (ref 70–99)
Glucose-Capillary: 46 mg/dL — ABNORMAL LOW (ref 70–99)
Glucose-Capillary: 64 mg/dL — ABNORMAL LOW (ref 70–99)
Glucose-Capillary: 67 mg/dL — ABNORMAL LOW (ref 70–99)
Glucose-Capillary: 74 mg/dL (ref 70–99)
Glucose-Capillary: 77 mg/dL (ref 70–99)
Glucose-Capillary: 94 mg/dL (ref 70–99)

## 2023-09-20 LAB — MAGNESIUM: Magnesium: 2.1 mg/dL (ref 1.7–2.4)

## 2023-09-20 MED ORDER — IPRATROPIUM-ALBUTEROL 0.5-2.5 (3) MG/3ML IN SOLN: 3.0000 mL | RESPIRATORY_TRACT | Status: DC | PRN

## 2023-09-20 MED ORDER — METHYLPREDNISOLONE SODIUM SUCC 125 MG IJ SOLR
125.0000 mg | Freq: Once | INTRAMUSCULAR | Status: AC
  Administered 2023-09-20: 125 mg via INTRAVENOUS
  Filled 2023-09-20: qty 2

## 2023-09-20 MED ORDER — NITROGLYCERIN 0.4 MG SL SUBL
0.4000 mg | SUBLINGUAL_TABLET | SUBLINGUAL | Status: DC | PRN
  Administered 2023-09-20: 0.4 mg via SUBLINGUAL

## 2023-09-20 MED ORDER — HYDRALAZINE HCL 20 MG/ML IJ SOLN
10.0000 mg | Freq: Four times a day (QID) | INTRAMUSCULAR | Status: DC | PRN
  Administered 2023-09-20 – 2023-09-22 (×4): 10 mg via INTRAVENOUS
  Filled 2023-09-20 (×4): qty 1

## 2023-09-20 MED ORDER — FUROSEMIDE 10 MG/ML IJ SOLN
40.0000 mg | Freq: Once | INTRAMUSCULAR | Status: AC
Start: 2023-09-20 — End: 2023-09-20
  Administered 2023-09-20: 40 mg via INTRAVENOUS
  Filled 2023-09-20: qty 4

## 2023-09-20 MED ORDER — IPRATROPIUM-ALBUTEROL 0.5-2.5 (3) MG/3ML IN SOLN
3.0000 mL | Freq: Once | RESPIRATORY_TRACT | Status: AC
  Administered 2023-09-20: 3 mL via RESPIRATORY_TRACT
  Filled 2023-09-20 (×2): qty 3

## 2023-09-20 MED ORDER — IPRATROPIUM-ALBUTEROL 0.5-2.5 (3) MG/3ML IN SOLN
3.0000 mL | Freq: Four times a day (QID) | RESPIRATORY_TRACT | Status: DC
  Administered 2023-09-20: 3 mL via RESPIRATORY_TRACT
  Filled 2023-09-20: qty 3

## 2023-09-20 MED ORDER — REVEFENACIN 175 MCG/3ML IN SOLN
175.0000 ug | Freq: Every day | RESPIRATORY_TRACT | Status: DC
  Administered 2023-09-20 – 2023-09-22 (×3): 175 ug via RESPIRATORY_TRACT
  Filled 2023-09-20 (×4): qty 3

## 2023-09-20 MED ORDER — MORPHINE SULFATE (PF) 2 MG/ML IV SOLN
1.0000 mg | Freq: Once | INTRAVENOUS | Status: AC
  Administered 2023-09-20: 1 mg via INTRAVENOUS
  Filled 2023-09-20: qty 1

## 2023-09-20 MED ORDER — POTASSIUM CHLORIDE CRYS ER 20 MEQ PO TBCR
20.0000 meq | EXTENDED_RELEASE_TABLET | Freq: Once | ORAL | Status: AC
  Administered 2023-09-20: 20 meq via ORAL
  Filled 2023-09-20: qty 1

## 2023-09-20 MED ORDER — POTASSIUM CHLORIDE CRYS ER 20 MEQ PO TBCR
40.0000 meq | EXTENDED_RELEASE_TABLET | ORAL | Status: AC
  Administered 2023-09-20 (×2): 40 meq via ORAL
  Filled 2023-09-20 (×2): qty 2

## 2023-09-20 MED ORDER — PREDNISONE 20 MG PO TABS
40.0000 mg | ORAL_TABLET | Freq: Every day | ORAL | Status: DC
Start: 2023-09-21 — End: 2023-09-24
  Administered 2023-09-21 – 2023-09-22 (×2): 40 mg via ORAL
  Filled 2023-09-20 (×2): qty 2

## 2023-09-20 MED ORDER — FUROSEMIDE 10 MG/ML IJ SOLN
40.0000 mg | Freq: Once | INTRAMUSCULAR | Status: AC
  Administered 2023-09-20: 40 mg via INTRAVENOUS
  Filled 2023-09-20: qty 4

## 2023-09-20 MED FILL — Midazolam HCl Inj 2 MG/2ML (Base Equivalent): INTRAMUSCULAR | Qty: 1.5 | Status: AC

## 2023-09-20 NOTE — Progress Notes (Signed)
 eLink Physician-Brief Progress Note Patient Name: Kyle George DOB: 08-Jan-1951 MRN: [id number]   Date of Service  09/20/2023  HPI/Events of Note  Corrected calcium  9.1 mg / dl.  eICU Interventions  No replacement indicated.        Chauncey Bruno U Ota Ebersole 09/20/2023, 3:57 AM

## 2023-09-20 NOTE — Progress Notes (Signed)
 [facility]                                                                              Kyle George, is a 73 y.o. 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  He was initially seen in the ED, alert awake and talking but then suffered a V-fib arrest.  He required defibrillation x 4, amiodarone , subsequently was bradycardiac with a heart rate in 50s on amiodarone  and epinephrine  infusion.  He was intubated during the cardiac arrest.  Cardiology was consulted and patient was admitted to [facility].  9/8 VF cardiac arrest after bradycardia 9/9: Extubated 9/10 pacemaker placed, transferred to regular floor 9/11: TRH assumed care, acute respiratory distress, CCM consulted again   Assessment & Plan       Cardiac arrest (HCC)/VF arrest suspected due to third-degree AV block - Received CPR, defibrillation x 4, amiodarone , temp wire placement, EP cardiology was consulted -2D echo 9/9 showed EF 55 to 60%, no regional WMA, moderate concentric LVH mildly elevated pulmonary artery systolic pressure, normal RV SF. - Status post pacemaker placement on 9/10     Acute respiratory failure with hypoxia (HCC), likely has underlying emphysema Presumed aspiration PNA - Called by RN for chest pain, SOB, shallow breathing evaluated this morning, wheezing, appears to have underlying emphysema - Placed on scheduled DuoNebs, Yupelri , currently on Pulmicort , Brovana  - Added Solu-Medrol  125 mg IV x 1, p.o. prednisone  from tomorrow, continue Augmentin , I-S, flutter valve - Requested CCM to reevaluate again - 40 mg IV Lasix  x 1 ordered by PCCM  Chest wall pain,  postcardiac arrest - Continue pain control, no rib fractures or pneumothorax    Prostate cancer (HCC) with bone mets -Continue to follow outpatient with primary oncologist     Diabetes mellitus (HCC), type II  controlled - Hemoglobin A1c 5.5 on 04/05/2023  -continue SSI sensitive CBG (last 3)  Recent Labs    09/20/23 0017 09/20/23 0310 09/20/23 0941  GLUCAP 77 74 94    Generalized debility - PT OT evaluation  Estimated body mass index is 25.17 kg/m as calculated from the following:   Height as of this encounter: 5' 7 (1.702 m).   Weight as of this encounter: 72.9 kg.  Code Status: Full code DVT Prophylaxis:  SCDs Start: 09/19/23 1932 heparin  injection 5,000 Units Start: 09/17/23 2245 SCDs Start: 09/17/23 2157   Level of Care: Level of care: Telemetry Cardiac Family Communication: Updated patient Disposition Plan:      Remains inpatient appropriate:      Procedures:  Pacemaker placement Intubation and mechanical ventilation  Consultants:   CCM EP cardiology  Antimicrobials:   Anti-infectives (From admission, onward)    Start     Dose/Rate Route Frequency Ordered Stop  09/19/23 1553  sodium chloride  0.9 % with gentamicin  (GARAMYCIN ) ADS Med       Note to Pharmacy: Raynell Garre L: cabinet override      09/19/23 1553 09/20/23 0359   09/19/23 1553  ceFAZolin  (ANCEF ) 2-4 GM/100ML-% IVPB       Note to Pharmacy: Raynell Garre L: cabinet override      09/19/23 1553 09/20/23 0359   09/19/23 1100  amoxicillin -clavulanate (AUGMENTIN ) 875-125 MG per tablet 1 tablet        1 tablet Oral Every 12 hours 09/19/23 0756 09/23/23 0959   09/19/23 0800  gentamicin  (GARAMYCIN ) 80 mg in sodium chloride  0.9 % 500 mL irrigation        80 mg Irrigation On call 09/19/23 0038 09/19/23 1738   09/19/23 0800  ceFAZolin  (ANCEF ) IVPB 2g/100 mL premix        2 g 200 mL/hr over 30 Minutes Intravenous On call 09/19/23 0038 09/19/23 1738   09/18/23 0845  Ampicillin -Sulbactam (UNASYN ) 3 g  in sodium chloride  0.9 % 100 mL IVPB  Status:  Discontinued        3 g 200 mL/hr over 30 Minutes Intravenous Every 8 hours 09/18/23 0751 09/19/23 0756          Medications  acetaminophen   650 mg Oral Q8H   amoxicillin -clavulanate  1 tablet Oral Q12H   arformoterol   15 mcg Nebulization BID   budesonide  (PULMICORT ) nebulizer solution  0.25 mg Nebulization BID   Chlorhexidine  Gluconate Cloth  6 each Topical Daily   furosemide   40 mg Intravenous Once   heparin   5,000 Units Subcutaneous Q8H   insulin  aspart  0-6 Units Subcutaneous TID WC   lidocaine   1 patch Transdermal Q24H   lidocaine   1 patch Transdermal Q24H   methocarbamol   500 mg Oral TID   polyethylene glycol  17 g Oral BID   potassium chloride   20 mEq Oral Once   [START ON 09/21/2023] predniSONE   40 mg Oral Q breakfast   revefenacin   175 mcg Nebulization Daily   senna-docusate  2 tablet Oral QHS      Subjective:   Kyle George was seen and examined today.  Seen for the first time this morning, having chest pain, increased work of breathing, shallow breathing with wheezing.  No fevers or chills.  BP elevated.  No nausea vomiting, abdominal pain.  Objective:   Vitals:   09/20/23 0800 09/20/23 0830 09/20/23 1112 09/20/23 1126  BP:  (!) 171/99 (!) 154/94   Pulse:  74    Resp:  (!) 21  (!) 22  Temp: 98.6 F (37 C) 98.6 F (37 C)    TempSrc: Oral Oral    SpO2:  96%    Weight:      Height:        Intake/Output Summary (Last 24 hours) at 09/20/2023 1139 Last data filed at 09/20/2023 1101 Gross per 24 hour  Intake 100 ml  Output 1375 ml  Net -1275 ml     Wt Readings from Last 3 Encounters:  09/17/23 72.9 kg  09/17/23 75.2 kg  08/29/23 76.8 kg     Exam General: Alert and oriented, increased WOB, ill-appearing Cardiovascular: S1 S2 auscultated,  RRR Respiratory: Mild expiratory wheezing, tachypnea Gastrointestinal: Soft, nontender, nondistended, + bowel sounds Ext: no pedal edema bilaterally Neuro: No new  deficits Psych: Normal affect, frail and deconditioned    Data Reviewed:  I have personally reviewed following labs    CBC Lab Results  Component Value  Date   WBC 9.7 09/20/2023   RBC 3.04 (L) 09/20/2023   HGB 10.4 (L) 09/20/2023   HCT 32.1 (L) 09/20/2023   MCV 105.6 (H) 09/20/2023   MCH 34.2 (H) 09/20/2023   PLT 129 (L) 09/20/2023   MCHC 32.4 09/20/2023   RDW 15.0 09/20/2023   LYMPHSABS 1.4 09/17/2023   MONOABS 0.4 09/17/2023   EOSABS 0.2 09/17/2023   BASOSABS 0.1 09/17/2023     Last metabolic panel Lab Results  Component Value Date   NA 139 09/20/2023   K 3.9 09/20/2023   CL 106 09/20/2023   CO2 20 (L) 09/20/2023   BUN 27 (H) 09/20/2023   CREATININE 1.17 09/20/2023   GLUCOSE 83 09/20/2023   GFRNONAA >60 09/20/2023   GFRAA >60 06/18/2019   CALCIUM  7.9 (L) 09/20/2023   PROT 5.5 (L) 09/20/2023   ALBUMIN 2.5 (L) 09/20/2023   LABGLOB 2.9 04/05/2023   AGRATIO 1.5 03/29/2021   BILITOT 0.9 09/20/2023   ALKPHOS 78 09/20/2023   AST 129 (H) 09/20/2023   ALT 268 (H) 09/20/2023   ANIONGAP 13 09/20/2023    CBG (last 3)  Recent Labs    09/20/23 0017 09/20/23 0310 09/20/23 0941  GLUCAP 77 74 94      Coagulation Profile: Recent Labs  Lab 09/17/23 2316  INR 1.1     Radiology Studies: I have personally reviewed the imaging studies  DG Chest 2 View Result Date: 09/20/2023 CLINICAL DATA:  Pacemaker placement EXAM: CHEST - 2 VIEW COMPARISON:  09/18/2023 FINDINGS: Dual lead pacer is present with proximal and distal leads projecting over the right atrium and proximal right ventricle presumably near the tricuspid valve. No pneumothorax. Mild to moderate enlargement of the cardiopericardial silhouette noted with indistinct pulmonary vasculature favoring pulmonary venous hypertension. Borderline appearance for interstitial edema but no Kerley B lines are visible. Mild blunting of the costophrenic angles. On the lateral projection, a 4.4 cm nodular density projecting over  the lower spine probably represents a confluence of bony findings and pleural fluid given the lack of a corroborating nodular lesion on the frontal projection. Follow up chest radiography or chest CT should be considered to ensure that there is no underlying pulmonary mass occult on the frontal projection. Vaguely confluent density in the right suprahilar region, possibly from confluent edema less likely to be from a pulmonary nodule, partially obscured on the 09/18/2023 exam due to an ECG lead. IMPRESSION: 1. Dual lead pacer is present with proximal and distal leads projecting over the right atrium and proximal right ventricle presumably near the tricuspid valve. No pneumothorax. 2. Mild to moderate enlargement of the cardiopericardial silhouette with pulmonary venous hypertension and borderline interstitial edema. 3. 4.4 cm nodular density projecting over the lower spine on the lateral projection probably represents a confluence of bony findings and pleural fluid given the lack of a corroborating nodular lesion on the frontal projection. Follow up chest radiography or chest CT should be considered to ensure that there is no underlying pulmonary mass occult on the frontal projection. 4. Vaguely confluent density in the right suprahilar region, possibly from confluent edema less likely to be from a pulmonary nodule, partially obscured on the 09/18/2023 exam due to a ECG lead. Electronically Signed   By: Ryan Salvage M.D.   On: 09/20/2023 08:59   EP PPM/ICD IMPLANT Result Date: 09/19/2023 SURGEON:  Soyla Norton, MD   PREPROCEDURE DIAGNOSIS:  complete AV block   POSTPROCEDURE DIAGNOSIS:  complete AV block    PROCEDURES:  1.  Pacemaker implantation.   INTRODUCTION:  JAGGER DEMONTE is a 73 y.o. male with a history of bradycardia who presents today for pacemaker implantation.  The patient reports intermittent episodes of dizziness over the past few months.  No reversible causes have been identified.  The patient  therefore presents today for pacemaker implantation.   DESCRIPTION OF PROCEDURE:  Informed written consent was obtained, and  the patient was brought to the electrophysiology lab in a fasting state.  The patient required no sedation for the procedure today.  The patients left chest was prepped and draped in the usual sterile fashion by the EP lab staff. The skin overlying the left deltopectoral region was infiltrated with lidocaine  for local analgesia.  A 4-cm incision was made over the left deltopectoral region.  A left subcutaneous pacemaker pocket was fashioned using a combination of sharp and blunt dissection. Electrocautery was required to assure hemostasis.  RA/RV Lead Placement: The left axillary vein was therefore cannulated.  Through the left axillary vein, a Medtronic CapSureFix Novus [id number]  (serial number  [id number]) right atrial lead and an Medtronic SelectSure 3830 (serial number  [id number]) right ventricular lead were advanced with fluoroscopic visualization into the right atrial appendage and left bundle area positions respectively.  Initial atrial lead P- waves measured 1.8 mV with impedance of 813 ohms and a threshold of 2.2 V at 0.5 msec.  Right ventricular lead R-waves measured 6.7 mV with an impedance of 800 ohms and a threshold of 1 V at 0.5 msec.  Both leads were secured to the pectoralis fascia using #2-0 silk over the suture sleeves. Device Placement:  The leads were then connected to an Medtronic Azure XT DR [id number]   (serial number  [id number] ) pacemaker.  The pocket was irrigated with copious gentamicin  solution.  The pacemaker was then placed into the pocket.  The pocket was then closed in 3 layers with 2.0 and 3.0 V-Loc suture for the subcutaneous layers and 3.0 Vicryl suture for the subcuticular layers.  Steri-  Strips and a sterile dressing were then applied. EBL<62ml.  There were no early apparent complications.   CONCLUSIONS:  1. Successful implantation of a Medtronic Azure XT DR  [id number]  dual-chamber pacemaker for symptomatic bradycardia  2. No early apparent complications.   Soyla Norton, MD 09/19/2023 5:47 PM      Marshall Roehrich M.D. [facility] 09/20/2023, 11:39 AM  Available via Epic secure chat 7am-7pm After 7 pm, please refer to night coverage provider listed on amion.

## 2023-09-20 NOTE — Progress Notes (Addendum)
  Patient Name: Kyle George Date of Encounter: 09/20/2023  Primary Cardiologist: None Electrophysiologist: Kyle Kyle Norton, MD  Interval Summary   S/p PPM implant yesterday without complications.   Having some chest congestion this AM, continues ot have chest discomfort.    Vital Signs    Vitals:   09/20/23 0311 09/20/23 0400 09/20/23 0647 09/20/23 0740  BP:  (!) 160/81 (!) 178/92   Pulse:  71 66   Resp:  18 19 (!) 24  Temp: 99.6 F (37.6 C)  98.6 F (37 C)   TempSrc: Oral  Oral   SpO2:  98% 97%   Weight:      Height:        Intake/Output Summary (Last 24 hours) at 09/20/2023 9176 Last data filed at 09/20/2023 0000 Gross per 24 hour  Intake 100 ml  Output 900 ml  Net -800 ml   Filed Weights   09/17/23 1601 09/17/23 2145  Weight: 75 kg 72.9 kg    Physical Exam    GEN- NAD, Alert and oriented, chronically ill-appearing Lungs- Clear to ausculation bilaterally, normal work of breathing Cardiac- Regular rate and rhythm, no murmurs, rubs or gallops, L chest outer dressing removed, steri-strips to remain in place GI- soft, NT, ND, + BS Extremities- no clubbing or cyanosis. No edema  Telemetry    Appropriate pacing, intermittent PVC (personally reviewed)  Hospital Course    Kyle George is a 73 y.o. male with PMH of prostate ca w bone mets, HTN, DM admitted for CHB complicated by VF arrest.   PPM implant 9/10  Assessment & Plan    #) PMVT #) CHB  S/p PPM implant 9/10 Interrogation this AM done by industry CXR with stable lead placement, no pneumo EKG with appropriate pacing  Wound care and activity restrictions reviewed with patient and placed in AVS Outpatient appts scheduled   EP Kyle sign off at this time, please reconsult if needed.        For questions or updates, please contact [facility] Please consult [email or web address] for contact info under     Signed, Kyle Calderwood, NP  09/20/2023, 8:23 AM

## 2023-09-20 NOTE — Progress Notes (Signed)
 NAME:  Kyle George, MRN:  [id number], DOB:  02-01-1950, LOS: 3 ADMISSION DATE:  09/17/2023, CONSULTATION DATE:  9/8 REFERRING MD: EDP, CHIEF COMPLAINT:  VF cardiac arrest   History of Present Illness:  73 yo male presented from the cancer center today with asymptomatic bradycardia. He initially was in the ED awake and alert, talking, but suffered a VF arrest. He required defibrillation x 4, amiodarone  to remain out of VF. He is bradycardic with rates in the 50s on amiodarone  and epinephrine  infusions. He was intubated during arrest. Cardiology is being consulted. PCCM was consulted for admission to [facility].  All history is obtained from chart and quite limited, as pt is intubated at this time.  Lactate >7 pH 7.1, mag and potassium all within normal range.  Pt arrived to our icu bradycardic and irreg. Rate in 30's on lidocaine /propofol  and 10 of epi. Cardiology pending their evaluation as they had also ordered amio but already on lido and already with HR in 30's.   Pertinent  Medical History  Stage IV prostate cancer HTN HLD DM  Significant Hospital Events: Including procedures, antibiotic start and stop dates in addition to other pertinent events   9/8 VF cardiac arrest after bradycardia 9/10 extubated yesterday, off pressors, Lido and inotropes, temp wire in place, EP planning PPM 9/11 TRH primary, CCM called back for resp sx   Interim History / Subjective:   Called back for resp sx  Pt reports feeling better on my arrival  Helped re-adjust him in bed to his comfort    Labs VS and imaging reviewed   Objective    Blood pressure (!) 171/99, pulse 74, temperature 98.6 F (37 C), temperature source Oral, resp. rate (!) 21, height 5' 7 (1.702 m), weight 72.9 kg, SpO2 96%.        Intake/Output Summary (Last 24 hours) at 09/20/2023 1039 Last data filed at 09/20/2023 0000 Gross per 24 hour  Intake 100 ml  Output 725 ml  Net -625 ml   Filed Weights   09/17/23 1601 09/17/23 2145   Weight: 75 kg 72.9 kg    General:  chronically and acutely ill M, NAD  HEENT: NCAT  Neuro:  AAO  CV:cap refill < 3. S1s2  PULM:  R>L wheeze. Inr RR, no accessory use   GI: soft ndnt  Extremities: no obvious acute joint deformity   Resolved problem list   Lactic acidosis due to hypoperfusion Acute encephalopathy post-arrest AKI  Hyperglycemia   Assessment and Plan   Acute resp failure w hypoxia Presumed aspiration PNA Tobacco use Emphysema -no PFTs, but emphysema on prior CT scans, plausible AECOPD 9/11 P -will extend nebs to triple therapy + PRN duonebs including STAT duoneb now -40 IV lasix  (have also ordered some PO K and will order for AM BMP and mag  -as he is doing some better, I have deferred changing current steroid plan for now (rcvd 125 solumedrol this AM and is planned for pred 40 tomorrow). If he is not continuing to improve or if he worsens, would change to IV  -Mag was WNL this morning but could also consider 2g as adjunct if we need add'l measures  -Doing ok w [employer] support. If incr WOB, could escalate  -cont augmentin   -cont pulm hygiene measures   VF arrest CHB  Hx HTN Stg IV prostate ca, bone mets  Chest wall pain, post arrest  -per primary     Labs   CBC: Recent Labs  Lab  09/17/23 1327 09/17/23 1605 09/17/23 1814 09/17/23 2215 09/17/23 2316 09/18/23 0125 09/19/23 0434 09/19/23 0939 09/20/23 0232  WBC 6.9  --  6.7  --  20.7* 19.7* 12.8* 12.9* 9.7  NEUTROABS 4.6  --  4.6  --   --   --   --   --   --   HGB 14.1   < > 13.4   < > 12.8* 12.5* 10.8* 11.2* 10.4*  HCT 42.4   < > 40.3   < > 40.0 39.6 32.2* 34.4* 32.1*  MCV 107.1*   < > 106.6*  --  109.6* 107.9* 105.2* 105.2* 105.6*  PLT 202   < > 186  --  231 219 137* 145* 129*   < > = values in this interval not displayed.    Basic Metabolic Panel: Recent Labs  Lab 09/17/23 1814 09/17/23 2215 09/18/23 0125 09/18/23 0323 09/19/23 0434 09/19/23 0939 09/20/23 0232  NA 139   < > 139 138  137 138 139  K 3.8   < > 3.3* 3.2* 4.1 4.1 3.9  CL 104  --  104 104 105 106 106  CO2 23  --  17* 17* 23 23 20*  GLUCOSE 74  --  339* 344* 91 80 83  BUN 17  --  24* 26* 28* 30* 27*  CREATININE 0.93   < > 1.71* 1.69* 1.53* 1.58* 1.17  CALCIUM  8.6*  --  8.7* 8.5* 8.4* 8.4* 7.9*  MG 1.9  --  1.6*  --  2.3  --  2.1   < > = values in this interval not displayed.   GFR: Estimated Creatinine Clearance: 51.8 mL/min (by C-G formula based on SCr of 1.17 mg/dL). Recent Labs  Lab 09/18/23 0125 09/18/23 0323 09/18/23 9061 09/18/23 1305 09/19/23 0434 09/19/23 0939 09/20/23 0232  WBC 19.7*  --   --   --  12.8* 12.9* 9.7  LATICACIDVEN 8.1* 5.9* 2.3* 1.3  --   --   --     Liver Function Tests: Recent Labs  Lab 09/17/23 1814 09/18/23 0125 09/19/23 0434 09/19/23 0939 09/20/23 0232  AST 15 869* 251* 221* 129*  ALT 10 659* 370* 366* 268*  ALKPHOS 43 50 48 53 78  BILITOT 0.6 0.6 1.0 1.1 0.9  PROT 6.6 6.0* 5.4* 5.6* 5.5*  ALBUMIN 3.2* 2.8* 2.4* 2.6* 2.5*   Mod MDM    Ronnald Gave MSN, AGACNP-BC [facility] Pulmonary/Critical Care Medicine Amion for pager 09/20/2023, 10:39 AM

## 2023-09-20 NOTE — Progress Notes (Signed)
 PT Cancellation Note  Patient Details Name: Kyle George MRN: [id number] DOB: 07-Apr-1950   Cancelled Treatment:    Reason Eval/Treat Not Completed: Medical issues which prohibited therapy  Spoke with RN, reports increased WOB and awaiting an ECHO before therapy evaluation. Will check back this afternoon as schedule permits. Dropped off handout and reviewed PPM precautions with patient.  Leontine Roads, PT, DPT [facility]  Re[facility] Services Physical Therapist Office: [phone] Website: [email or web address]   Leontine GORMAN Roads 09/20/2023, 8:07 AM

## 2023-09-20 NOTE — Discharge Instructions (Signed)
 After Your Pacemaker   You have a Medtronic Pacemaker  If you have a Medtronic or Biotronik device, plug in your home monitor once you get home, and no manual interaction is required.   If you have an [employer] or [employer] device, plug your home monitor once you get home, sit near the device, and press the large activation button. Sit nearby until the process is complete, usually notated by lights on the monitor.   If you were set up for monitoring using an app on your phone, make sure the app remains open in the background and the Bluetooth remains on.  ACTIVITY Do not lift your arm above shoulder height for 1 week after your procedure. After 7 days, you may progress as below.  You should remove your sling 24 hours after your procedure, unless otherwise instructed by your provider.     Thursday September 27, 2023  Friday September 28, 2023 Saturday September 29, 2023 Sunday September 30, 2023   Do not lift, push, pull, or carry anything over 10 pounds with the affected arm until 6 weeks (Thursday November 01, 2023 ) after your procedure.   You may drive AFTER your wound check, unless you have been told otherwise by your provider.   Ask your healthcare provider when you can go back to work   INCISION/Dressing If you are on a blood thinner such as Coumadin, Xarelto, Eliquis, Plavix, or Pradaxa please confirm with your provider when this should be resumed.   If large square, outer bandage is left in place, this can be removed after 24 hours from your procedure. Do not remove steri-strips or glue as below.   If a PRESSURE DRESSING (a bulky dressing that usually goes up over your shoulder) was applied or left in place, please follow instructions given by your provider on when to return to have this removed.   Monitor your Pacemaker site for redness, swelling, and drainage. Call the device clinic at [phone] if you experience these symptoms or fever/chills.  If your incision is  sealed with Steri-strips or staples, you may shower 7 days after your procedure or when told by your provider. Do not remove the steri-strips or let the shower hit directly on your site. You may wash around your site with soap and water.    If you were discharged in a sling, please do not wear this during the day more than 48 hours after your surgery unless otherwise instructed. This may increase the risk of stiffness and soreness in your shoulder.   Avoid lotions, ointments, or perfumes over your incision until it is well-healed.  You may use a hot tub or a pool AFTER your wound check appointment if the incision is completely closed.  Pacemaker Alerts:  Some alerts are vibratory and others beep. These are NOT emergencies. Please call our office to let us  know. If this occurs at night or on weekends, it can wait until the next business day. Send a remote transmission.  If your device is capable of reading fluid status (for heart failure), you will be offered monthly monitoring to review this with you.   DEVICE MANAGEMENT Remote monitoring is used to monitor your pacemaker from home. This monitoring is scheduled every 91 days by our office. It allows us  to keep an eye on the functioning of your device to ensure it is working properly. You will routinely see your Electrophysiologist annually (more often if necessary).  This will appear as a REMOTE check on your  MyChart schedule. These are automatic and there is nothing for you to manually do unless otherwise instructed.  You should receive your ID card for your new device in 4-8 weeks. Keep this card with you at all times once received. Consider wearing a medical alert bracelet or necklace.  Your Pacemaker may be MRI compatible. This will be discussed at your next office visit/wound check.  You should avoid contact with strong electric or magnetic fields.   Do not use amateur (ham) radio equipment or electric (arc) welding torches. MP3 player  headphones with magnets should not be used. Some devices are safe to use if held at least 12 inches (30 cm) from your Pacemaker. These include power tools, lawn mowers, and speakers. If you are unsure if something is safe to use, ask your health care provider.  When using your cell phone, hold it to the ear that is on the opposite side from the Pacemaker. Do not leave your cell phone in a pocket over the Pacemaker.  You may safely use electric blankets, heating pads, computers, and microwave ovens.  Call the office right away if: You have chest pain. You feel more short of breath than you have felt before. You feel more light-headed than you have felt before. Your incision starts to open up.  This information is not intended to replace advice given to you by your health care provider. Make sure you discuss any questions you have with your health care provider.

## 2023-09-20 NOTE — Evaluation (Addendum)
 Occupational Therapy Evaluation Patient Details Name: Kyle George MRN: [id number] DOB: [birth year] Today's Date: 09/20/2023   History of Present Illness   Pt is 73 yo presenting to [facility] on 9/8 from cancer center with asymptomatic bradycardia. Pt went into VF arrest and was defibrillated. Pt intubated 9/8 and extubated 9/9. Temp wire in place. PMH: Stage IV prostate cancer, HTN, HLD, DM.     Clinical Impressions Pt admitted based on above, and was seen based on problem list below. PTA pt was independent with ADLs and IADLs. Today pt is requiring set up  to min assist for ADLs. Functional transfers are  CGA with use of RW. Educated pt and wife on use of compensatory strategies for ADLs with pacemaker precautions and use of shower chair to promote energy conservation. Noted pt with decreased situational awareness, problem solving, and delayed processing, will continue to assess at future treatment sessions. Pt currently limited by decreased strength and activity tolerance. Pt would benefit from  [facility] to return to PLOF. OT will continue to follow acutely to maximize functional independence.        If plan is discharge home, recommend the following:   A little help with walking and/or transfers;A little help with bathing/dressing/bathroom;Help with stairs or ramp for entrance;Assistance with cooking/housework     Functional Status Assessment   Patient has had a recent decline in their functional status and demonstrates the ability to make significant improvements in function in a reasonable and predictable amount of time.     Equipment Recommendations   Tub/shower seat      Precautions/Restrictions   Precautions Precautions: Fall Recall of Precautions/Restrictions: Impaired Restrictions Weight Bearing Restrictions Per Provider Order: No     Mobility Bed Mobility Overal bed mobility: Needs Assistance Bed Mobility: Supine to Sit     Supine to sit: Supervision      General bed mobility comments: S for safety with adhering to lines, cues for safety    Transfers Overall transfer level: Needs assistance Equipment used: Rolling walker (2 wheels) Transfers: Sit to/from Stand, Bed to chair/wheelchair/BSC Sit to Stand: Contact guard assist     Step pivot transfers: Contact guard assist     General transfer comment: CGA for balance, cues for using RW      Balance Overall balance assessment: Needs assistance Sitting-balance support: No upper extremity supported, Feet supported Sitting balance-Leahy Scale: Fair     Standing balance support: Bilateral upper extremity supported, During functional activity, Reliant on assistive device for balance Standing balance-Leahy Scale: Poor Standing balance comment: Benefits from RW         ADL either performed or assessed with clinical judgement   ADL Overall ADL's : Needs assistance/impaired Eating/Feeding: Set up;Sitting   Grooming: Set up;Sitting           Upper Body Dressing : Sitting;Minimal assistance Upper Body Dressing Details (indicate cue type and reason): Cueing for adhering to pacemaker precautions Lower Body Dressing: Minimal assistance;Sit to/from stand Lower Body Dressing Details (indicate cue type and reason): Assist to reach BLEs. Fair standing balance unsupported Toilet Transfer: Contact guard assist;Rolling walker (2 wheels) Toilet Transfer Details (indicate cue type and reason): Short step pivot simulated in room Toileting- Clothing Manipulation and Hygiene: Contact guard assist;Sit to/from stand       Functional mobility during ADLs: Contact guard assist;Rolling walker (2 wheels) General ADL Comments: CGA for balance     Vision Baseline Vision/History: 0 No visual deficits Patient Visual Report: No change from baseline Vision Assessment?:  No apparent visual deficits            Pertinent Vitals/Pain Pain Assessment Pain Assessment: Faces Faces Pain Scale: Hurts a  little bit Pain Location: Chest with coughing Pain Descriptors / Indicators: Discomfort, Grimacing Pain Intervention(s): Monitored during session     Extremity/Trunk Assessment Upper Extremity Assessment Upper Extremity Assessment: Generalized weakness   Lower Extremity Assessment Lower Extremity Assessment: Generalized weakness   Cervical / Trunk Assessment Cervical / Trunk Assessment: Normal   Communication Communication Communication: No apparent difficulties   Cognition Arousal: Alert Behavior During Therapy: WFL for tasks assessed/performed Cognition: Cognition impaired   Orientation impairments: Situation, Time Awareness: Online awareness impaired Memory impairment (select all impairments): Short-term memory   Executive functioning impairment (select all impairments): Problem solving, Sequencing OT - Cognition Comments: Pt with delayed processing, decreased situational awareness unaware of recent procedure     Following commands: Intact       Cueing  General Comments   Cueing Techniques: Verbal cues  O2 stable on 4L, BP remaintaining stable but elevated           Home Living Family/patient expects to be discharged to:: Private residence Living Arrangements: Spouse/significant other Available Help at Discharge: Family Type of Home: House Home Access: Stairs to enter Secretary/administrator of Steps: 3 Entrance Stairs-Rails: None Home Layout: One level     Bathroom Shower/Tub: Chief Strategy Officer: Standard Bathroom Accessibility: Yes How Accessible: Accessible via walker Home Equipment: None          Prior Functioning/Environment Prior Level of Function : Independent/Modified Independent             Mobility Comments: Reports no AD, denies falls ADLs Comments: Ind    OT Problem List: Decreased strength;Decreased range of motion;Decreased activity tolerance;Impaired balance (sitting and/or standing);Decreased safety  awareness;Decreased cognition;Cardiopulmonary status limiting activity   OT Treatment/Interventions: Self-care/ADL training;Therapeutic exercise;Energy conservation;DME and/or AE instruction;Therapeutic activities;Patient/family education;Balance training      OT Goals(Current goals can be found in the care plan section)   Acute Rehab OT Goals Patient Stated Goal: To walk OT Goal Formulation: With patient Time For Goal Achievement: 10/04/23 Potential to Achieve Goals: Good   OT Frequency:  Min 2X/week       AM-PAC OT 6 Clicks Daily Activity     Outcome Measure Help from another person eating meals?: None Help from another person taking care of personal grooming?: A Little Help from another person toileting, which includes using toliet, bedpan, or urinal?: A Little Help from another person bathing (including washing, rinsing, drying)?: A Little Help from another person to put on and taking off regular upper body clothing?: A Little Help from another person to put on and taking off regular lower body clothing?: A Little 6 Click Score: 19   End of Session Equipment Utilized During Treatment: Gait belt;Rolling walker (2 wheels);Oxygen Nurse Communication: Mobility status  Activity Tolerance: Patient tolerated treatment well Patient left: in chair;with call bell/phone within reach;with family/visitor present  OT Visit Diagnosis: Unsteadiness on feet (R26.81);Other abnormalities of gait and mobility (R26.89);Muscle weakness (generalized) (M62.81)                Time: 1310-1336 OT Time Calculation (min): 26 min Charges:  OT General Charges $OT Visit: 1 Visit OT Evaluation $OT Eval Moderate Complexity: 1 Mod OT Treatments $Self Care/Home Management : 8-22 mins  Adrianne BROCKS, OT  Acute Rehabilitation Services Office [phone] Secure chat preferred   Adrianne GORMAN Savers 09/20/2023, 1:56 PM

## 2023-09-20 NOTE — Evaluation (Signed)
 Physical Therapy Evaluation Patient Details Name: Kyle George MRN: [id number] DOB: Feb 04, 1950 Today's Date: 09/20/2023  History of Present Illness  Pt is 73 yo presenting to [facility] on 9/8 from cancer center with asymptomatic bradycardia. Pt went into VF arrest and was defibrillated. Pt intubated 9/8 and extubated 9/9. 9/10 pacemaker placed. PMH: Stage IV prostate cancer, HTN, HLD, DM.  Clinical Impression  Pt admitted with above diagnosis. Independent and active with gardening PTA. Able to transfer  from recliner with CGA today. Ambulating >100 feet  with CGA for safety. SpO2 97% on 2L, minimal dyspnea. Educated on PPM precautions. Anticipate good functional recovery. Will follow and progress during acute admission. Pt currently with functional limitations due to the deficits listed below (see PT Problem List). Pt will benefit from acute skilled PT to increase their independence and safety with mobility to allow discharge.           If plan is discharge home, recommend the following: A little help with bathing/dressing/bathroom;Assistance with cooking/housework;Help with stairs or ramp for entrance;Assist for transportation   Can travel by private vehicle        Equipment Recommendations None recommended by PT  Recommendations for Other Services       Functional Status Assessment Patient has had a recent decline in their functional status and demonstrates the ability to make significant improvements in function in a reasonable and predictable amount of time.     Precautions / Restrictions Precautions Precautions: Fall Recall of Precautions/Restrictions: Impaired Restrictions Weight Bearing Restrictions Per Provider Order: No      Mobility  Bed Mobility               General bed mobility comments: in recliner    Transfers Overall transfer level: Needs assistance Equipment used: None Transfers: Sit to/from Stand, Bed to chair/wheelchair/BSC Sit to Stand: Contact guard  assist           General transfer comment: CGA for safety to rise, cues to scoot forward and avoid pulling with LUE (donned sling prior to moving.) Pushes through RUE to stand. Stablized without external support. Denies dizziness.    Ambulation/Gait Ambulation/Gait assistance: Contact guard assist Gait Distance (Feet): 110 Feet Assistive device: None Gait Pattern/deviations: Step-through pattern, Decreased stride length Gait velocity: dec Gait velocity interpretation: <1.8 ft/sec, indicate of risk for recurrent falls   General Gait Details: Grossly stable, slow and guarded but no overt LOB or buckling noted. minimal deviations from straight path. SpO2 97% on 2L supplemental O2. Educated on safety and symptom awareness.  Stairs            Wheelchair Mobility     Tilt Bed    Modified Rankin (Stroke Patients Only)       Balance Overall balance assessment: Needs assistance Sitting-balance support: No upper extremity supported, Feet supported Sitting balance-Leahy Scale: Good     Standing balance support: During functional activity, No upper extremity supported Standing balance-Leahy Scale: Fair                               Pertinent Vitals/Pain Pain Assessment Pain Assessment: Faces Faces Pain Scale: Hurts a little bit Pain Location: Chest with coughing Pain Descriptors / Indicators: Discomfort, Grimacing Pain Intervention(s): Monitored during session    Home Living Family/patient expects to be discharged to:: Private residence Living Arrangements: Spouse/significant other Available Help at Discharge: Family Type of Home: House Home Access: Stairs to enter Entrance Stairs-Rails: None  Entrance Stairs-Number of Steps: 3   Home Layout: One level Home Equipment: None      Prior Function Prior Level of Function : Independent/Modified Independent             Mobility Comments: Reports no AD, denies falls ADLs Comments: Ind      Extremity/Trunk Assessment   Upper Extremity Assessment Upper Extremity Assessment: Defer to OT evaluation    Lower Extremity Assessment Lower Extremity Assessment: Generalized weakness    Cervical / Trunk Assessment Cervical / Trunk Assessment: Normal  Communication   Communication Communication: No apparent difficulties    Cognition Arousal: Alert Behavior During Therapy: WFL for tasks assessed/performed   PT - Cognitive impairments: No apparent impairments                         Following commands: Intact       Cueing Cueing Techniques: Verbal cues     General Comments General comments (skin integrity, edema, etc.): SpO2 97% on 2L with gait. 98% on 3L at rest.    Exercises     Assessment/Plan    PT Assessment Patient needs continued PT services  PT Problem List Decreased strength;Decreased range of motion;Decreased activity tolerance;Decreased balance;Decreased mobility;Decreased knowledge of use of DME;Decreased knowledge of precautions;Cardiopulmonary status limiting activity;Pain       PT Treatment Interventions DME instruction;Gait training;Stair training;Functional mobility training;Therapeutic activities;Therapeutic exercise;Balance training;Neuromuscular re-education;Patient/family education;Modalities    PT Goals (Current goals can be found in the Care Plan section)  Acute Rehab PT Goals Patient Stated Goal: Get well, back in the garden. PT Goal Formulation: With patient Time For Goal Achievement: 10/04/23 Potential to Achieve Goals: Good    Frequency Min 2X/week     Co-evaluation               AM-PAC PT 6 Clicks Mobility  Outcome Measure Help needed turning from your back to your side while in a flat bed without using bedrails?: None Help needed moving from lying on your back to sitting on the side of a flat bed without using bedrails?: A Little Help needed moving to and from a bed to a chair (including a wheelchair)?: A  Little Help needed standing up from a chair using your arms (e.g., wheelchair or bedside chair)?: A Little Help needed to walk in hospital room?: A Little Help needed climbing 3-5 steps with a railing? : A Little 6 Click Score: 19    End of Session Equipment Utilized During Treatment: Oxygen (Lt shoulder sling) Activity Tolerance: Patient tolerated treatment well Patient left: in chair;with call bell/phone within reach;with chair alarm set (MD in room)   PT Visit Diagnosis: Unsteadiness on feet (R26.81);Other abnormalities of gait and mobility (R26.89);Muscle weakness (generalized) (M62.81);Difficulty in walking, not elsewhere classified (R26.2);Pain Pain - part of body:  (chest)    Time: 8551-8491 PT Time Calculation (min) (ACUTE ONLY): 20 min   Charges:   PT Evaluation $PT Eval Low Complexity: 1 Low   PT General Charges $$ ACUTE PT VISIT: 1 Visit         Leontine Roads, PT, DPT [facility]  Rehabilitation Services Physical Therapist Office: [phone] Website: [email or web address]   Leontine GORMAN Roads 09/20/2023, 4:13 PM

## 2023-09-21 DIAGNOSIS — J9601 Acute respiratory failure with hypoxia: Secondary | ICD-10-CM | POA: Diagnosis not present

## 2023-09-21 DIAGNOSIS — J441 Chronic obstructive pulmonary disease with (acute) exacerbation: Secondary | ICD-10-CM

## 2023-09-21 DIAGNOSIS — I469 Cardiac arrest, cause unspecified: Secondary | ICD-10-CM | POA: Diagnosis not present

## 2023-09-21 DIAGNOSIS — E0800 Diabetes mellitus due to underlying condition with hyperosmolarity without nonketotic hyperglycemic-hyperosmolar coma (NKHHC): Secondary | ICD-10-CM | POA: Diagnosis not present

## 2023-09-21 DIAGNOSIS — J690 Pneumonitis due to inhalation of food and vomit: Secondary | ICD-10-CM

## 2023-09-21 LAB — CBC
HCT: 37 % — ABNORMAL LOW (ref 39.0–52.0)
Hemoglobin: 12.5 g/dL — ABNORMAL LOW (ref 13.0–17.0)
MCH: 34.6 pg — ABNORMAL HIGH (ref 26.0–34.0)
MCHC: 33.8 g/dL (ref 30.0–36.0)
MCV: 102.5 fL — ABNORMAL HIGH (ref 80.0–100.0)
Platelets: 145 K/uL — ABNORMAL LOW (ref 150–400)
RBC: 3.61 MIL/uL — ABNORMAL LOW (ref 4.22–5.81)
RDW: 14.7 % (ref 11.5–15.5)
WBC: 11.9 K/uL — ABNORMAL HIGH (ref 4.0–10.5)
nRBC: 0 % (ref 0.0–0.2)

## 2023-09-21 LAB — HEPATIC FUNCTION PANEL
ALT: 200 U/L — ABNORMAL HIGH (ref 0–44)
AST: 71 U/L — ABNORMAL HIGH (ref 15–41)
Albumin: 3.1 g/dL — ABNORMAL LOW (ref 3.5–5.0)
Alkaline Phosphatase: 66 U/L (ref 38–126)
Bilirubin, Direct: 0.2 mg/dL (ref 0.0–0.2)
Indirect Bilirubin: 0.9 mg/dL (ref 0.3–0.9)
Total Bilirubin: 1.1 mg/dL (ref 0.0–1.2)
Total Protein: 7.1 g/dL (ref 6.5–8.1)

## 2023-09-21 LAB — BASIC METABOLIC PANEL WITH GFR
Anion gap: 13 (ref 5–15)
BUN: 27 mg/dL — ABNORMAL HIGH (ref 8–23)
CO2: 24 mmol/L (ref 22–32)
Calcium: 8.4 mg/dL — ABNORMAL LOW (ref 8.9–10.3)
Chloride: 103 mmol/L (ref 98–111)
Creatinine, Ser: 1.12 mg/dL (ref 0.61–1.24)
GFR, Estimated: >60 mL/min
Glucose, Bld: 98 mg/dL (ref 70–99)
Potassium: 4.5 mmol/L (ref 3.5–5.1)
Sodium: 140 mmol/L (ref 135–145)

## 2023-09-21 LAB — GLUCOSE, CAPILLARY
Glucose-Capillary: 95 mg/dL (ref 70–99)
Glucose-Capillary: 145 mg/dL — ABNORMAL HIGH (ref 70–99)
Glucose-Capillary: 164 mg/dL — ABNORMAL HIGH (ref 70–99)
Glucose-Capillary: 130 mg/dL — ABNORMAL HIGH (ref 70–99)

## 2023-09-21 LAB — MAGNESIUM: Magnesium: 1.9 mg/dL (ref 1.7–2.4)

## 2023-09-21 MED ORDER — TRANEXAMIC ACID FOR INHALATION
500.0000 mg | Freq: Three times a day (TID) | RESPIRATORY_TRACT | Status: AC
  Administered 2023-09-21 (×2): 500 mg via RESPIRATORY_TRACT
  Filled 2023-09-21 (×3): qty 10

## 2023-09-21 MED ORDER — IRBESARTAN 150 MG PO TABS
300.0000 mg | ORAL_TABLET | Freq: Every day | ORAL | Status: DC
  Administered 2023-09-21 – 2023-09-22 (×2): 300 mg via ORAL
  Filled 2023-09-21 (×2): qty 2

## 2023-09-21 MED ORDER — OLMESARTAN-AMLODIPINE-HCTZ 40-10-25 MG PO TABS: 1.0000 | ORAL_TABLET | Freq: Every day | ORAL | Status: DC

## 2023-09-21 MED ORDER — HYDROCHLOROTHIAZIDE 25 MG PO TABS
25.0000 mg | ORAL_TABLET | Freq: Every day | ORAL | Status: DC
  Administered 2023-09-21 – 2023-09-22 (×2): 25 mg via ORAL
  Filled 2023-09-21 (×2): qty 1

## 2023-09-21 MED ORDER — AMLODIPINE BESYLATE 10 MG PO TABS
10.0000 mg | ORAL_TABLET | Freq: Every day | ORAL | Status: DC
  Administered 2023-09-21 – 2023-09-22 (×2): 10 mg via ORAL
  Filled 2023-09-21 (×2): qty 1

## 2023-09-21 NOTE — Telephone Encounter (Signed)
 LVM for patient to call and discuss scheduling 4-6 week New Patient appointment with pulmonary

## 2023-09-21 NOTE — Progress Notes (Signed)
 Mobility Specialist Progress Note:   09/21/23 1015  Mobility  Activity Ambulated with assistance  Level of Assistance Contact guard assist, steadying assist  Assistive Device Front wheel walker  Distance Ambulated (ft) 200 ft  Activity Response Tolerated well  Mobility Referral Yes  Mobility visit 1 Mobility  Mobility Specialist Start Time (ACUTE ONLY) 1015  Mobility Specialist Stop Time (ACUTE ONLY) 1030  Mobility Specialist Time Calculation (min) (ACUTE ONLY) 15 min   Pt received ambulating in hallway with daughter. Ambulated with minG assist for safety. No c/o throughout, pt eager to get OOB. Back in chair with all needs met. VSS on RA.   Therisa Rana Mobility Specialist Please contact via SecureChat or  Rehab office at [phone]

## 2023-09-21 NOTE — Progress Notes (Addendum)
  Progress Note  Patient Name: Kyle George Date of Encounter: 09/21/2023 [facility] Cardiologist: None   Interval Summary   Feels good today.  Improving each day.  Ambulating with physical therapy.  Daughter is accompanying in the room.  No shortness of breath or chest pain.  Vital Signs Vitals:   09/20/23 2352 09/21/23 0147 09/21/23 0441 09/21/23 0750  BP: (!) 165/83 (!) 165/109 (!) 159/90   Pulse: 70  72   Resp: (!) 24  20   Temp: 97.9 F (36.6 C)  (!) 97.4 F (36.3 C) 97.8 F (36.6 C)  TempSrc: Oral  Oral Axillary  SpO2: 99%  94%   Weight:      Height:        Intake/Output Summary (Last 24 hours) at 09/21/2023 0926 Last data filed at 09/21/2023 0442 Gross per 24 hour  Intake 0 ml  Output 2250 ml  Net -2250 ml      09/17/2023    9:45 PM 09/17/2023    4:01 PM 09/17/2023    2:09 PM  Last 3 Weights  Weight (lbs) 160 lb 11.5 oz 165 lb 5.5 oz 165 lb 12.6 oz  Weight (kg) 72.9 kg 75 kg 75.2 kg      Telemetry/ECG  V pacing, sinus intermittent PVCs.- Personally Reviewed  Physical Exam  GEN: No acute distress.   Neck: No JVD Cardiac: RRR, no murmurs, rubs, or gallops.  Respiratory: Clear to auscultation bilaterally. GI: Soft, nontender, non-distended  MS: Right sided upper extremity edema off from IV lines.  Patient Profile Patient with past medical history significant for prostate cancer with bone metastases, hypertension, diabetes.  Patient was seen at the cancer center with asymptomatic bradycardia, in the ED suffered VF arrest secondary to complete heart block.  Defibrillated x 4.  Now with PPM.  Transferred out of the unit, general cardiology asked to resume care.  Assessment & Plan   VF arrest/CHB VF arrest likely R on T (in setting of bradycardia)  Pt shocked x 4 with ROSC.  Status post PPM 9/10, stable device function with no complications.  Echocardiogram this admission with preserved biventricular function.  RVSP 42.  Mild MR.  Moderate to severe  TR. Euvolemic.   Lactic acidosis Transaminitis AKI All metrics improving and he is looking much better.  Ambulating and doing well.  Acute respiratory failure COPD, aspiraration PNA Following up with pulmonology clinic.  Hypertension Home meds have been resumed today per primary team, BP elevated.  Restarted on home amlodipine  10 mg, HCTZ 25 mg, irbesartan  300 mg.  Will arrange follow up.   For questions or updates, please contact [facility] Please consult [email or web address] for contact info under       Signed, Thom LITTIE Sluder, PA-C     Patient seen and examined   I agree with findings as noted above by GORMAN Sluder Pt looks good sitting in chair Neck  JVP is normal Lungs are CTA Cardiac   RRR  No S3    Chest   Dressing dry Ext  No edema  S/p VF arrest in setting of CHB  Resuscitated  LVEF normal  RVEF normal  Now with PPM  HTN  BP elevated  Home meds restarted    Can be fine tuned as outpt  WIll make sure pt has follow up in clinic    Vina Gull MD

## 2023-09-21 NOTE — Plan of Care (Signed)
  Problem: Education: Goal: Knowledge of General Education information will improve Description: Including pain rating scale, medication(s)/side effects and non-pharmacologic comfort measures Outcome: Progressing   Problem: Health Behavior/Discharge Planning: Goal: Ability to manage health-related needs will improve Outcome: Progressing   Problem: Clinical Measurements: Goal: Ability to maintain clinical measurements within normal limits will improve Outcome: Progressing Goal: Will remain free from infection Outcome: Progressing Goal: Diagnostic test results will improve Outcome: Progressing Goal: Respiratory complications will improve Outcome: Progressing Goal: Cardiovascular complication will be avoided Outcome: Progressing   Problem: Activity: Goal: Risk for activity intolerance will decrease Outcome: Progressing   Problem: Nutrition: Goal: Adequate nutrition will be maintained Outcome: Progressing   Problem: Coping: Goal: Level of anxiety will decrease Outcome: Progressing   Problem: Elimination: Goal: Will not experience complications related to bowel motility Outcome: Progressing Goal: Will not experience complications related to urinary retention Outcome: Progressing   Problem: Pain Managment: Goal: General experience of comfort will improve and/or be controlled Outcome: Progressing   Problem: Safety: Goal: Ability to remain free from injury will improve Outcome: Progressing   Problem: Skin Integrity: Goal: Risk for impaired skin integrity will decrease Outcome: Progressing   Problem: Education: Goal: Ability to manage disease process will improve Outcome: Progressing   Problem: Cardiac: Goal: Ability to achieve and maintain adequate cardiopulmonary perfusion will improve Outcome: Progressing   Problem: Neurologic: Goal: Promote progressive neurologic recovery Outcome: Progressing   Problem: Skin Integrity: Goal: Risk for impaired skin integrity  will be minimized. Outcome: Progressing   Problem: Education: Goal: Ability to describe self-care measures that may prevent or decrease complications (Diabetes Survival Skills Education) will improve Outcome: Progressing Goal: Individualized Educational Video(s) Outcome: Progressing   Problem: Coping: Goal: Ability to adjust to condition or change in health will improve Outcome: Progressing   Problem: Fluid Volume: Goal: Ability to maintain a balanced intake and output will improve Outcome: Progressing   Problem: Health Behavior/Discharge Planning: Goal: Ability to identify and utilize available resources and services will improve Outcome: Progressing Goal: Ability to manage health-related needs will improve Outcome: Progressing   Problem: Metabolic: Goal: Ability to maintain appropriate glucose levels will improve Outcome: Progressing   Problem: Nutritional: Goal: Maintenance of adequate nutrition will improve Outcome: Progressing Goal: Progress toward achieving an optimal weight will improve Outcome: Progressing   Problem: Skin Integrity: Goal: Risk for impaired skin integrity will decrease Outcome: Progressing   Problem: Tissue Perfusion: Goal: Adequacy of tissue perfusion will improve Outcome: Progressing   Problem: Education: Goal: Knowledge of cardiac device and self-care will improve Outcome: Progressing Goal: Ability to safely manage health related needs after discharge will improve Outcome: Progressing Goal: Individualized Educational Video(s) Outcome: Progressing   Problem: Cardiac: Goal: Ability to achieve and maintain adequate cardiopulmonary perfusion will improve Outcome: Progressing

## 2023-09-21 NOTE — Progress Notes (Signed)
 NAME:  Kyle George, MRN:  [id number], DOB:  [birth year], LOS: 4 ADMISSION DATE:  09/17/2023, CONSULTATION DATE:  9/8 REFERRING MD: EDP, CHIEF COMPLAINT:  VF cardiac arrest   History of Present Illness:  73 yo male presented from the cancer center today with asymptomatic bradycardia. He initially was in the ED awake and alert, talking, but suffered a VF arrest. He required defibrillation x 4, amiodarone  to remain out of VF. He is bradycardic with rates in the 50s on amiodarone  and epinephrine  infusions. He was intubated during arrest. Cardiology is being consulted. PCCM was consulted for admission to [facility].  All history is obtained from chart and quite limited, as pt is intubated at this time.  Lactate >7 pH 7.1, mag and potassium all within normal range.  Pt arrived to our icu bradycardic and irreg. Rate in 30's on lidocaine /propofol  and 10 of epi. Cardiology pending their evaluation as they had also ordered amio but already on lido and already with HR in 30's.   Pertinent  Medical History  Stage IV prostate cancer HTN HLD DM  Significant Hospital Events: Including procedures, antibiotic start and stop dates in addition to other pertinent events   9/8 VF cardiac arrest after bradycardia 9/10 extubated yesterday, off pressors, Lido and inotropes, temp wire in place, EP planning PPM 9/11 TRH primary, CCM called back for resp sx   Interim History / Subjective:  Breathing is better, now on RA. Coughing up thick sputum; had coughed up blood yesterday.   Objective    Blood pressure (!) 159/90, pulse 72, temperature 97.8 F (36.6 C), temperature source Axillary, resp. rate 20, height 5' 7 (1.702 m), weight 72.9 kg, SpO2 94%.        Intake/Output Summary (Last 24 hours) at 09/21/2023 1341 Last data filed at 09/21/2023 0442 Gross per 24 hour  Intake 0 ml  Output 1600 ml  Net -1600 ml   Filed Weights   09/17/23 1601 09/17/23 2145  Weight: 75 kg 72.9 kg    General:  chronically ill  appearing man sitting up in bed in NAD HEENT: [place]/AT, eyes anicteric Neuro:  awake, alert, sitting up in the chair in NAD CV: S1S2, RRR PULM:  breathing comfortably on RA, no conversational dyspnea; dark brown opaque sputum in a tissue at bedside   GI: soft, NT Extremities: no significant peripheral edema  CXR 2 view yesterday reviewed> mild increase in pulmonary interstitial markings, likely retrocardiac opacity.   BUN 27 Cr 1.12 WBC 11.9 H/H 12.5/37 Platelets 145  Resolved problem list   Lactic acidosis due to hypoperfusion Acute encephalopathy post-arrest AKI  Hyperglycemia   Assessment and Plan   Acute respiratory failure with hypoxia Aspiration PNA Tobacco use Emphysema; acute COPD with wheezing based on clinical exam -con't steroids today; would complete 5 days -con't augmentin  -collect respiratory culture -if coughing up frank blood, recommend CTA chest -AM CXR -con't triple inhaled therapy; would recommend discharge on LAMA LABA inhaler (Stiolto, Anoro, or Bevespi- whichever insurance covers). -pulmonary follow up has been requested in [place] office   Labs   CBC: Recent Labs  Lab 09/17/23 1327 09/17/23 1605 09/17/23 1814 09/17/23 2215 09/18/23 0125 09/19/23 0434 09/19/23 0939 09/20/23 0232 09/21/23 0450  WBC 6.9  --  6.7   < > 19.7* 12.8* 12.9* 9.7 11.9*  NEUTROABS 4.6  --  4.6  --   --   --   --   --   --   HGB 14.1   < > 13.4   < >  12.5* 10.8* 11.2* 10.4* 12.5*  HCT 42.4   < > 40.3   < > 39.6 32.2* 34.4* 32.1* 37.0*  MCV 107.1*   < > 106.6*   < > 107.9* 105.2* 105.2* 105.6* 102.5*  PLT 202   < > 186   < > 219 137* 145* 129* 145*   < > = values in this interval not displayed.    Basic Metabolic Panel: Recent Labs  Lab 09/17/23 1814 09/17/23 2215 09/18/23 0125 09/18/23 0323 09/19/23 0434 09/19/23 0939 09/20/23 0232 09/21/23 0450  NA 139   < > 139 138 137 138 139 140  K 3.8   < > 3.3* 3.2* 4.1 4.1 3.9 4.5  CL 104  --  104 104 105 106  106 103  CO2 23  --  17* 17* 23 23 20* 24  GLUCOSE 74  --  339* 344* 91 80 83 98  BUN 17  --  24* 26* 28* 30* 27* 27*  CREATININE 0.93   < > 1.71* 1.69* 1.53* 1.58* 1.17 1.12  CALCIUM  8.6*  --  8.7* 8.5* 8.4* 8.4* 7.9* 8.4*  MG 1.9  --  1.6*  --  2.3  --  2.1 1.9   < > = values in this interval not displayed.   GFR: Estimated Creatinine Clearance: 54.1 mL/min (by C-G formula based on SCr of 1.12 mg/dL). Recent Labs  Lab 09/18/23 0125 09/18/23 0323 09/18/23 9061 09/18/23 1305 09/19/23 0434 09/19/23 0939 09/20/23 0232 09/21/23 0450  WBC 19.7*  --   --   --  12.8* 12.9* 9.7 11.9*  LATICACIDVEN 8.1* 5.9* 2.3* 1.3  --   --   --   --     Liver Function Tests: Recent Labs  Lab 09/18/23 0125 09/19/23 0434 09/19/23 0939 09/20/23 0232 09/21/23 0450  AST 869* 251* 221* 129* 71*  ALT 659* 370* 366* 268* 200*  ALKPHOS 50 48 53 78 66  BILITOT 0.6 1.0 1.1 0.9 1.1  PROT 6.0* 5.4* 5.6* 5.5* 7.1  ALBUMIN 2.8* 2.4* 2.6* 2.5* 3.1*     Leita SHAUNNA Gaskins, DO 09/21/23 2:11 PM [facility]  For contact information, see Amion. If no response to pager, please call PCCM consult pager. After hours, 7PM- 7AM, please call Elink.

## 2023-09-21 NOTE — Plan of Care (Addendum)
 Patient has 2 episode of small amount of hemoptysis.  Ordering TXA neb.  This patient has been admitted for cardiac AV block and status postcardiac arrest required pacemaker placement 9/10, acute hypoxic respiratory failure in the setting of emphysema and aspiration pneumonia.  -Given patient has small amount of hemoptysis starting TXA neb.  Hemodynamically stable.  Holding pharmacological DVT prophylaxis IV heparin . - Informing PCCM/pulmonology to evaluate in the daytime.

## 2023-09-21 NOTE — Progress Notes (Signed)
 Foley catheter was removed per MD order.

## 2023-09-21 NOTE — Progress Notes (Signed)
 [facility]                                                                              Kyle George, is a 73 y.o. male, DOB - 02/21/1950, FMW:[id number] Admit date - 09/17/2023    Outpatient Primary MD for the patient is Kyle Speaks, [name]  LOS - 4  days  Chief Complaint  Patient presents with   Bradycardia       Brief summary   Patient is a 73 year old male with history of stage IV prostate CA with bone mets, HTN, HLP, DM presented from the cancer center with asymptomatic bradycardia.  He was initially seen in the ED, alert awake and talking but then suffered a V-fib arrest.  He required defibrillation x 4, amiodarone , subsequently was bradycardiac with a heart rate in 50s on amiodarone  and epinephrine  infusion.  He was intubated during the cardiac arrest.  Cardiology was consulted and patient was admitted to [facility].  9/8 VF cardiac arrest after bradycardia 9/9: Extubated 9/10 pacemaker placed, transferred to regular floor 9/11: TRH assumed care, acute respiratory distress, CCM consulted again   Assessment & Plan       Cardiac arrest (HCC)/VF arrest suspected due to third-degree AV block - Received CPR, defibrillation x 4, amiodarone , temp wire placement, EP cardiology was consulted -2D echo 9/9 showed EF 55 to 60%, no regional WMA, moderate concentric LVH mildly elevated pulmonary artery systolic pressure, normal RV SF. - Status post pacemaker placement on 9/10     Acute respiratory failure with hypoxia (HCC), likely has underlying emphysema Presumed aspiration PNA - Called by RN for chest pain, SOB, shallow breathing evaluated this morning, wheezing, appears to have underlying emphysema - Continue DuoNebs, Yupelri , Pulmicort , Brovana , flutter valve  -Received Solu-Medrol  125 mg IV x 1, continue prednisone  x 5 days - Received Lasix  40 mg IV x 2 yesterday, on Aldactone  outpatient, not resumed yet - Overnight small hemoptysis, pulmonology following.  -   Continue Augmentin  for 5 days. - Outpatient follow-up with pulmonology  Essential hypertension/HTN urgency - BP uncontrolled, resumed amlodipine , HCTZ, irbesartan  - Aldactone  currently on hold, defer diuretics to cardiology  Chest wall pain, postcardiac arrest - Continue pain control, no rib fractures or pneumothorax    Prostate cancer (HCC) with bone mets -Continue to follow outpatient with primary oncologist     Diabetes mellitus (HCC), type II  controlled - Hemoglobin A1c 5.5 on 04/05/2023  -continue SSI sensitive CBG (last 3)  Recent Labs    09/20/23 1655 09/20/23 2154 09/21/23 0743  GLUCAP 238* 118* 95    Generalized debility - PT OT evaluation  Estimated body mass index is 25.17 kg/m as calculated from the following:   Height as of this encounter: 5' 7 (1.702 m).   Weight as of this encounter: 72.9 kg.  Code Status: Full code DVT Prophylaxis:  SCDs Start: 09/19/23 1932 SCDs Start: 09/17/23 2157   Level of Care: Level of care: Telemetry Cardiac Family Communication: Updated patient's daughter at the bedside Disposition Plan:      Remains inpatient appropriate:      Procedures:  Pacemaker placement Intubation and mechanical ventilation  Consultants:   CCM EP cardiology  Antimicrobials:   Anti-infectives (From admission, onward)    Start     Dose/Rate Route Frequency Ordered Stop   09/19/23 1553  sodium chloride  0.9 % with gentamicin  (GARAMYCIN ) ADS Med       Note to Pharmacy: Raynell Garre L: cabinet override      09/19/23 1553 09/20/23 0359   09/19/23 1553  ceFAZolin  (ANCEF ) 2-4 GM/100ML-% IVPB       Note to Pharmacy: Raynell Garre L: cabinet override      09/19/23 1553 09/20/23 0359   09/19/23 1100  amoxicillin -clavulanate (AUGMENTIN ) 875-125 MG per tablet 1 tablet        1 tablet Oral Every 12 hours 09/19/23 0756 09/23/23 0959   09/19/23 0800  gentamicin  (GARAMYCIN ) 80 mg in sodium chloride  0.9 % 500 mL irrigation        80 mg Irrigation On  call 09/19/23 0038 09/19/23 1738   09/19/23 0800  ceFAZolin  (ANCEF ) IVPB 2g/100 mL premix        2 g 200 mL/hr over 30 Minutes Intravenous On call 09/19/23 0038 09/19/23 1738   09/18/23 0845  Ampicillin -Sulbactam (UNASYN ) 3 g in sodium chloride  0.9 % 100 mL IVPB  Status:  Discontinued        3 g 200 mL/hr over 30 Minutes Intravenous Every 8 hours 09/18/23 0751 09/19/23 0756          Medications  acetaminophen   650 mg Oral Q8H   amLODipine   10 mg Oral Daily   And   hydrochlorothiazide   25 mg Oral Daily   And   irbesartan   300 mg Oral Daily   amoxicillin -clavulanate  1 tablet Oral Q12H   arformoterol   15 mcg Nebulization BID   budesonide  (PULMICORT ) nebulizer solution  0.25 mg Nebulization BID   Chlorhexidine  Gluconate Cloth  6 each Topical Daily   insulin  aspart  0-6 Units Subcutaneous TID WC   lidocaine   1 patch Transdermal Q24H   lidocaine   1 patch Transdermal Q24H   methocarbamol   500 mg Oral TID   polyethylene glycol  17 g Oral BID   predniSONE   40 mg Oral Q breakfast   revefenacin   175 mcg Nebulization Daily   senna-docusate  2 tablet Oral QHS   tranexamic acid   500 mg Nebulization Q8H      Subjective:   Lynwood Laos was seen and examined today.  Feeling better this morning however BP uncontrolled and elevated.  Daughter at the bedside.  Going to resume his outpatient medications.  Breathing better today, had received Lasix  x 2 yesterday.  No fever or chills, nausea vomiting or abdominal pain.   Overnight had small hemoptysis   Objective:   Vitals:   09/20/23 2352 09/21/23 0147 09/21/23 0441 09/21/23 0750  BP: (!) 165/83 (!) 165/109 (!) 159/90   Pulse: 70  72   Resp: (!) 24  20   Temp: 97.9 F (36.6 C)  (!) 97.4 F (36.3 C) 97.8 F (36.6 C)  TempSrc: Oral  Oral Axillary  SpO2: 99%  94%   Weight:      Height:        Intake/Output Summary (Last 24 hours) at 09/21/2023 1228 Last data filed at 09/21/2023 0442 Gross per 24 hour  Intake 0 ml  Output 1600 ml   Net -1600 ml     Wt Readings from Last 3 Encounters:  09/17/23 72.9 kg  09/17/23 75.2 kg  08/29/23 76.8 kg   Physical Exam General: Alert  and oriented x 3, NAD ill-appearing, frail Cardiovascular: S1 S2 clear, RRR.  Respiratory: Dec BS at the bases, clear, no significant wheezing today Gastrointestinal: Soft, nontender, nondistended, NBS Ext: no pedal edema bilaterally Neuro: no new deficits Psych: Normal affect    Data Reviewed:  I have personally reviewed following labs    CBC Lab Results  Component Value Date   WBC 11.9 (H) 09/21/2023   RBC 3.61 (L) 09/21/2023   HGB 12.5 (L) 09/21/2023   HCT 37.0 (L) 09/21/2023   MCV 102.5 (H) 09/21/2023   MCH 34.6 (H) 09/21/2023   PLT 145 (L) 09/21/2023   MCHC 33.8 09/21/2023   RDW 14.7 09/21/2023   LYMPHSABS 1.4 09/17/2023   MONOABS 0.4 09/17/2023   EOSABS 0.2 09/17/2023   BASOSABS 0.1 09/17/2023     Last metabolic panel Lab Results  Component Value Date   NA 140 09/21/2023   K 4.5 09/21/2023   CL 103 09/21/2023   CO2 24 09/21/2023   BUN 27 (H) 09/21/2023   CREATININE 1.12 09/21/2023   GLUCOSE 98 09/21/2023   GFRNONAA >60 09/21/2023   GFRAA >60 06/18/2019   CALCIUM  8.4 (L) 09/21/2023   PROT 7.1 09/21/2023   ALBUMIN 3.1 (L) 09/21/2023   LABGLOB 2.9 04/05/2023   AGRATIO 1.5 03/29/2021   BILITOT 1.1 09/21/2023   ALKPHOS 66 09/21/2023   AST 71 (H) 09/21/2023   ALT 200 (H) 09/21/2023   ANIONGAP 13 09/21/2023    CBG (last 3)  Recent Labs    09/20/23 1655 09/20/23 2154 09/21/23 0743  GLUCAP 238* 118* 95      Coagulation Profile: Recent Labs  Lab 09/17/23 2316  INR 1.1     Radiology Studies: I have personally reviewed the imaging studies  DG Chest 2 View Result Date: 09/20/2023 CLINICAL DATA:  Pacemaker placement EXAM: CHEST - 2 VIEW COMPARISON:  09/18/2023 FINDINGS: Dual lead pacer is present with proximal and distal leads projecting over the right atrium and proximal right ventricle presumably near  the tricuspid valve. No pneumothorax. Mild to moderate enlargement of the cardiopericardial silhouette noted with indistinct pulmonary vasculature favoring pulmonary venous hypertension. Borderline appearance for interstitial edema but no Kerley B lines are visible. Mild blunting of the costophrenic angles. On the lateral projection, a 4.4 cm nodular density projecting over the lower spine probably represents a confluence of bony findings and pleural fluid given the lack of a corroborating nodular lesion on the frontal projection. Follow up chest radiography or chest CT should be considered to ensure that there is no underlying pulmonary mass occult on the frontal projection. Vaguely confluent density in the right suprahilar region, possibly from confluent edema less likely to be from a pulmonary nodule, partially obscured on the 09/18/2023 exam due to an ECG lead. IMPRESSION: 1. Dual lead pacer is present with proximal and distal leads projecting over the right atrium and proximal right ventricle presumably near the tricuspid valve. No pneumothorax. 2. Mild to moderate enlargement of the cardiopericardial silhouette with pulmonary venous hypertension and borderline interstitial edema. 3. 4.4 cm nodular density projecting over the lower spine on the lateral projection probably represents a confluence of bony findings and pleural fluid given the lack of a corroborating nodular lesion on the frontal projection. Follow up chest radiography or chest CT should be considered to ensure that there is no underlying pulmonary mass occult on the frontal projection. 4. Vaguely confluent density in the right suprahilar region, possibly from confluent edema less likely to be from a pulmonary  nodule, partially obscured on the 09/18/2023 exam due to a ECG lead. Electronically Signed   By: Ryan Salvage M.D.   On: 09/20/2023 08:59   EP PPM/ICD IMPLANT Result Date: 09/19/2023 SURGEON:  Soyla Norton, MD   PREPROCEDURE DIAGNOSIS:   complete AV block   POSTPROCEDURE DIAGNOSIS:  complete AV block    PROCEDURES:  1. Pacemaker implantation.   INTRODUCTION:  DONATELLO KLEVE is a 73 y.o. male with a history of bradycardia who presents today for pacemaker implantation.  The patient reports intermittent episodes of dizziness over the past few months.  No reversible causes have been identified.  The patient therefore presents today for pacemaker implantation.   DESCRIPTION OF PROCEDURE:  Informed written consent was obtained, and  the patient was brought to the electrophysiology lab in a fasting state.  The patient required no sedation for the procedure today.  The patients left chest was prepped and draped in the usual sterile fashion by the EP lab staff. The skin overlying the left deltopectoral region was infiltrated with lidocaine  for local analgesia.  A 4-cm incision was made over the left deltopectoral region.  A left subcutaneous pacemaker pocket was fashioned using a combination of sharp and blunt dissection. Electrocautery was required to assure hemostasis.  RA/RV Lead Placement: The left axillary vein was therefore cannulated.  Through the left axillary vein, a Medtronic CapSureFix Novus [id number]  (serial number  [id number]) right atrial lead and an Medtronic SelectSure [id number] (serial number  [id number]) right ventricular lead were advanced with fluoroscopic visualization into the right atrial appendage and left bundle area positions respectively.  Initial atrial lead P- waves measured 1.8 mV with impedance of 813 ohms and a threshold of 2.2 V at 0.5 msec.  Right ventricular lead R-waves measured 6.7 mV with an impedance of 800 ohms and a threshold of 1 V at 0.5 msec.  Both leads were secured to the pectoralis fascia using #2-0 silk over the suture sleeves. Device Placement:  The leads were then connected to an Medtronic Azure XT DR [id number]   (serial number  [id number] ) pacemaker.  The pocket was irrigated with copious gentamicin  solution.  The  pacemaker was then placed into the pocket.  The pocket was then closed in 3 layers with 2.0 and 3.0 V-Loc suture for the subcutaneous layers and 3.0 Vicryl suture for the subcuticular layers.  Steri-  Strips and a sterile dressing were then applied. EBL<100ml.  There were no early apparent complications.   CONCLUSIONS:  1. Successful implantation of a Medtronic Azure XT DR [id number]  dual-chamber pacemaker for symptomatic bradycardia  2. No early apparent complications.   Soyla Norton, MD 09/19/2023 5:47 PM      Keyaira Clapham M.D. [facility] 09/21/2023, 12:28 PM  Available via Epic secure chat 7am-7pm After 7 pm, please refer to night coverage provider listed on amion.

## 2023-09-22 ENCOUNTER — Other Ambulatory Visit (HOSPITAL_COMMUNITY): Payer: Self-pay

## 2023-09-22 ENCOUNTER — Inpatient Hospital Stay (HOSPITAL_COMMUNITY)

## 2023-09-22 ENCOUNTER — Encounter: Payer: Self-pay | Admitting: Oncology

## 2023-09-22 DIAGNOSIS — I469 Cardiac arrest, cause unspecified: Secondary | ICD-10-CM | POA: Diagnosis not present

## 2023-09-22 DIAGNOSIS — J9601 Acute respiratory failure with hypoxia: Secondary | ICD-10-CM | POA: Diagnosis not present

## 2023-09-22 DIAGNOSIS — E0800 Diabetes mellitus due to underlying condition with hyperosmolarity without nonketotic hyperglycemic-hyperosmolar coma (NKHHC): Secondary | ICD-10-CM | POA: Diagnosis not present

## 2023-09-22 LAB — HEPATIC FUNCTION PANEL
ALT: 122 U/L — ABNORMAL HIGH (ref 0–44)
AST: 37 U/L (ref 15–41)
Albumin: 3 g/dL — ABNORMAL LOW (ref 3.5–5.0)
Alkaline Phosphatase: 61 U/L (ref 38–126)
Bilirubin, Direct: 0.1 mg/dL (ref 0.0–0.2)
Indirect Bilirubin: 0.7 mg/dL (ref 0.3–0.9)
Total Bilirubin: 0.8 mg/dL (ref 0.0–1.2)
Total Protein: 6.7 g/dL (ref 6.5–8.1)

## 2023-09-22 LAB — BASIC METABOLIC PANEL WITH GFR
Anion gap: 12 (ref 5–15)
BUN: 22 mg/dL (ref 8–23)
CO2: 23 mmol/L (ref 22–32)
Calcium: 8.2 mg/dL — ABNORMAL LOW (ref 8.9–10.3)
Chloride: 102 mmol/L (ref 98–111)
Creatinine, Ser: 1.01 mg/dL (ref 0.61–1.24)
GFR, Estimated: >60 mL/min (ref >60)
Glucose, Bld: 95 mg/dL (ref 70–99)
Potassium: 3.9 mmol/L (ref 3.5–5.1)
Sodium: 137 mmol/L (ref 135–145)

## 2023-09-22 LAB — CBC
HCT: 37.5 % — ABNORMAL LOW (ref 39.0–52.0)
Hemoglobin: 12.5 g/dL — ABNORMAL LOW (ref 13.0–17.0)
MCH: 34.5 pg — ABNORMAL HIGH (ref 26.0–34.0)
MCHC: 33.3 g/dL (ref 30.0–36.0)
MCV: 103.6 fL — ABNORMAL HIGH (ref 80.0–100.0)
Platelets: 162 K/uL (ref 150–400)
RBC: 3.62 MIL/uL — ABNORMAL LOW (ref 4.22–5.81)
RDW: 14.8 % (ref 11.5–15.5)
WBC: 8.3 K/uL (ref 4.0–10.5)
nRBC: 0 % (ref 0.0–0.2)

## 2023-09-22 LAB — GLUCOSE, CAPILLARY: Glucose-Capillary: 92 mg/dL (ref 70–99)

## 2023-09-22 LAB — MAGNESIUM: Magnesium: 1.8 mg/dL (ref 1.7–2.4)

## 2023-09-22 MED ORDER — SENNOSIDES-DOCUSATE SODIUM 8.6-50 MG PO TABS
2.0000 | ORAL_TABLET | Freq: Every day | ORAL | 0 refills | Status: AC
  Filled 2023-09-22: qty 60, 30d supply, fill #0

## 2023-09-22 MED ORDER — TIOTROPIUM BROMIDE-OLODATEROL 2.5-2.5 MCG/ACT IN AERS
2.0000 | INHALATION_SPRAY | Freq: Every day | RESPIRATORY_TRACT | 4 refills | Status: AC
  Filled 2023-09-22: qty 4, 30d supply, fill #0

## 2023-09-22 MED ORDER — METHOCARBAMOL 500 MG PO TABS
500.0000 mg | ORAL_TABLET | Freq: Three times a day (TID) | ORAL | 0 refills | Status: AC
  Filled 2023-09-22: qty 90, 30d supply, fill #0

## 2023-09-22 MED ORDER — PREDNISONE 20 MG PO TABS
40.0000 mg | ORAL_TABLET | Freq: Every day | ORAL | 0 refills | Status: AC
  Filled 2023-09-22: qty 6, 3d supply, fill #0

## 2023-09-22 MED ORDER — ALBUTEROL SULFATE HFA 108 (90 BASE) MCG/ACT IN AERS
2.0000 | INHALATION_SPRAY | Freq: Four times a day (QID) | RESPIRATORY_TRACT | 2 refills | Status: AC | PRN
  Filled 2023-09-22: qty 6.7, 25d supply, fill #0

## 2023-09-22 MED ORDER — AMOXICILLIN-POT CLAVULANATE 875-125 MG PO TABS
1.0000 | ORAL_TABLET | Freq: Two times a day (BID) | ORAL | 0 refills | Status: AC
  Filled 2023-09-22: qty 4, 2d supply, fill #0

## 2023-09-22 MED ORDER — LIDOCAINE 4 % EX PTCH
1.0000 | MEDICATED_PATCH | CUTANEOUS | 0 refills | Status: AC
  Filled 2023-09-22: qty 30, 30d supply, fill #0

## 2023-09-22 MED ORDER — OXYCODONE HCL 5 MG PO TABS
5.0000 mg | ORAL_TABLET | Freq: Four times a day (QID) | ORAL | 0 refills | Status: AC | PRN
  Filled 2023-09-22: qty 28, 7d supply, fill #0

## 2023-09-22 NOTE — Progress Notes (Signed)
   NAME:  CLOY COZZENS, MRN:  [id number], DOB:  04-09-1950, LOS: 5 ADMISSION DATE:  09/17/2023, CONSULTATION DATE:  9/8 REFERRING MD: EDP, CHIEF COMPLAINT:  VF cardiac arrest   History of Present Illness:  73 yo male presented from the cancer center today with asymptomatic bradycardia. He initially was in the ED awake and alert, talking, but suffered a VF arrest. He required defibrillation x 4, amiodarone  to remain out of VF. He is bradycardic with rates in the 50s on amiodarone  and epinephrine  infusions. He was intubated during arrest. Cardiology is being consulted. PCCM was consulted for admission to [facility].  All history is obtained from chart and quite limited, as pt is intubated at this time.  Lactate >7 pH 7.1, mag and potassium all within normal range.  Pt arrived to our icu bradycardic and irreg. Rate in 30's on lidocaine /propofol  and 10 of epi. Cardiology pending their evaluation as they had also ordered amio but already on lido and already with HR in 30's.   Pertinent  Medical History  Stage IV prostate cancer HTN HLD DM  Significant Hospital Events: Including procedures, antibiotic start and stop dates in addition to other pertinent events   9/8 VF cardiac arrest after bradycardia 9/10 extubated yesterday, off pressors, Lido and inotropes, temp wire in place, EP planning PPM 9/11 TRH primary, CCM called back for resp sx  9/12 Breathing is better, now on RA. Coughing up thick sputum; had coughed up blood yesterday.  9/13 no acute issues overnight, seen ambulating with physical therapy  Interim History / Subjective:  States he feels well with no acute complaints, currently ambulating with physical therapy  Objective    Blood pressure (!) 173/92, pulse 71, temperature 97.8 F (36.6 C), temperature source Oral, resp. rate 17, height 5' 7 (1.702 m), weight 69.7 kg, SpO2 96%.        Intake/Output Summary (Last 24 hours) at 09/22/2023 0724 Last data filed at 09/21/2023 2120 Gross  per 24 hour  Intake 240 ml  Output 850 ml  Net -610 ml   Filed Weights   09/17/23 1601 09/17/23 2145 09/22/23 0618  Weight: 75 kg 72.9 kg 69.7 kg   General: Acute on chronic ill-appearing thin elderly male in hallway walking with physical therapy  HEENT: [place]/AT, MM pink/moist, PERRL,  Neuro: Alert and oriented x 3, nonfocal CV: s1s2 regular rate and rhythm, no murmur, rubs, or gallops,  PULM: No increased work of breathing, on room air, ambulating GI: soft, bowel sounds active in all 4 quadrants, non-tender, non-distended, tolerating oral diet Extremities: warm/dry, no edema  Skin: no rashes or lesions  Resolved problem list  Lactic acidosis due to hypoperfusion Acute encephalopathy post-arrest AKI  Hyperglycemia   Assessment and Plan   Acute respiratory failure with hypoxia Aspiration PNA Tobacco use Emphysema; acute COPD with wheezing based on clinical exam P: Continue steroids to complete 5 days total Remains on Augmentin , stop date in place Follow sputum culture once collected Continue triple inhaled therapy, per attending would recommend discharging on LAMA LABA inhaler (Stiolto, Anoro, or Bevespi- whichever insurance covers). Will need to follow-up with pulmonary in the outpatient setting, request has been sent to the office to establish visit  Signature:  Kawehi Hostetter D. Harris, NP-C [facility] Personal contact information can be found on Amion  If no contact or response made please call [phone] 09/22/2023, 8:03 AM

## 2023-09-22 NOTE — Plan of Care (Signed)
  Problem: Health Behavior/Discharge Planning: Goal: Ability to manage health-related needs will improve Outcome: Progressing   Problem: Clinical Measurements: Goal: Respiratory complications will improve Outcome: Progressing Goal: Cardiovascular complication will be avoided Outcome: Progressing   Problem: Activity: Goal: Risk for activity intolerance will decrease Outcome: Progressing   Problem: Nutrition: Goal: Adequate nutrition will be maintained Outcome: Progressing   Problem: Safety: Goal: Ability to remain free from injury will improve Outcome: Progressing   Problem: Cardiac: Goal: Ability to achieve and maintain adequate cardiopulmonary perfusion will improve Outcome: Progressing   Problem: Neurologic: Goal: Promote progressive neurologic recovery Outcome: Progressing   Problem: Cardiac: Goal: Ability to achieve and maintain adequate cardiopulmonary perfusion will improve Outcome: Progressing

## 2023-09-22 NOTE — Discharge Summary (Signed)
 Physician Discharge Summary   Patient: Kyle George MRN: [id number] DOB: 09-14-1950  Admit date:     09/17/2023  Discharge date: 09/22/23  Discharge Physician: Nydia Distance, MD    PCP: [name], [name]   Recommendations at discharge:   Augmentin  875-125 mg p.o. 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  He was initially seen in the ED, alert awake and talking but then suffered a V-fib arrest.  He required defibrillation x 4, amiodarone , subsequently was bradycardiac with a heart rate in 50s on amiodarone  and epinephrine  infusion.  He was intubated during the cardiac arrest.  Cardiology was consulted and patient was admitted to [facility].  9/8 VF cardiac arrest after bradycardia 9/9: Extubated 9/10 pacemaker placed, transferred to regular floor 9/11: TRH assumed care, acute respiratory distress, CCM consulted again   Assessment and Plan:   Cardiac arrest (HCC)/VF arrest suspected due to third-degree AV block - Received CPR, defibrillation x 4, amiodarone , temp wire placement, EP cardiology was consulted -2D echo 9/9 showed EF 55 to 60%, no regional WMA, moderate concentric LVH mildly elevated pulmonary artery systolic pressure, normal RV SF. - Status post pacemaker placement on 9/10       Acute respiratory failure with hypoxia (HCC), likely has underlying emphysema Presumed aspiration PNA - Inpatient  was placed on Pulmicort , Brovana , Yupelri , DuoNebs.  Pulmonology was consulted and recommended Stiolto, prednisone  -Received Solu-Medrol  125 mg IV x 1, continue prednisone  x 5 days -  Continue Augmentin  for 5 days. - Outpatient follow-up with pulmonology   Essential hypertension/HTN urgency - BP improving, continue amlodipine , HCTZ, irbesartan  and Aldactone    Chest wall pain, postcardiac arrest - Continue pain control, no rib fractures or pneumothorax Continue lidocaine  patch, Robaxin , oxycodone  as needed for the chest wall pain     Prostate cancer (HCC) with bone mets -Continue to follow outpatient with primary oncologist       Diabetes mellitus (HCC), type II  controlled - Hemoglobin A1c 5.5 on 04/05/2023    Generalized debility - Now much improved, ambulating   Estimated body mass index is 25.17 kg/m as calculated from the following:   Height as of this encounter: 5' 7 (1.702 m).   Weight as of this encounter: 72.9 kg.        Pain control - [place]  Controlled Substance Reporting System database was reviewed. and patient was instructed, not to drive, operate heavy machinery, perform activities at heights, swimming or participation in water activities or provide baby-sitting services while on Pain, Sleep and Anxiety Medications; until their outpatient Physician has advised to do so again. Also recommended to not to take more than prescribed Pain, Sleep and Anxiety Medications.  Consultants: Cardiology, CCM Procedures performed: Pacemaker Disposition: Home Diet recommendation:  Discharge Diet Orders (From admission, onward)     Start     Ordered   09/22/23 0000  Diet Carb Modified  09/22/23 1007            DISCHARGE MEDICATION: Allergies as of 09/22/2023   No Known Allergies      Medication List     TAKE these medications    albuterol  108 (90 Base) MCG/ACT inhaler Commonly known as: VENTOLIN  HFA Inhale 2 puffs into the lungs every 6 (six) hours  as needed for wheezing or shortness of breath.   amoxicillin -clavulanate 875-125 MG tablet Commonly known as: AUGMENTIN  Take 1 tablet by mouth 2 (two) times daily for 2 days.   aspirin 81 MG tablet Take 81 mg by mouth daily.   CALCIUM  600+D PO Take 1 tablet by mouth daily. Taking calcium  + D3   cholecalciferol 1000 units tablet Commonly known as: VITAMIN D  Take 5,000 Units by mouth daily.   FISH OIL PO Take 1 tablet by mouth daily.   lidocaine  4 % Place 1 patch onto the skin daily. Remove & Discard patch within 12 hours or as directed by MD. Apply to sternum.   methocarbamol  500 MG tablet Commonly known as: ROBAXIN  Take 1 tablet (500 mg total) by mouth 3 (three) times daily.   Olmesartan -amLODIPine -HCTZ 40-10-25 MG Tabs TAKE 1 TABLET BY MOUTH EVERY DAY   oxyCODONE  5 MG immediate release tablet Commonly known as: Oxy IR/ROXICODONE  Take 1 tablet (5 mg total) by mouth every 6 (six) hours as needed for moderate pain (pain score 4-6) or severe pain (pain score 7-10).   Ozempic  (0.25 or 0.5 MG/DOSE) 2 MG/3ML Sopn Generic drug: Semaglutide (0.25 or 0.5MG /DOS) INJECT 0.25 MG WEEKLY FOR 4 WEEKS, THEN INCREASE TO 0.5 MG WEEKLY What changed: See the new instructions.   pioglitazone -metformin  15-850 MG tablet Commonly known as: ACTOPLUS MET  Take 1 tablet by mouth 2 (two) times daily.   predniSONE  20 MG tablet Commonly known as: DELTASONE  Take 2 tablets (40 mg total) by mouth daily with breakfast for 3 days. Start taking on: September 23, 2023   rosuvastatin  10 MG tablet Commonly known as: CRESTOR  TAKE 1 TABLET BY MOUTH EVERY DAY   senna-docusate 8.6-50 MG tablet Commonly known as: Senokot-S Take 2 tablets by mouth at bedtime. For constipation   spironolactone  25 MG tablet Commonly known as: ALDACTONE  TAKE 1 TABLET (25 MG TOTAL) BY MOUTH DAILY.   Tiotropium Bromide -Olodaterol 2.5-2.5 MCG/ACT Aers Inhale 2 puffs into the lungs daily.   Xtandi  40 MG tablet Generic drug:  enzalutamide  TAKE 4 TABLETS (160 MG TOTAL) BY MOUTH DAILY.               Discharge Care Instructions  (From admission, onward)           Start     Ordered   09/22/23 0000  If the dressing is still on your incision site when you go home, remove it on the third day after your surgery date. Remove dressing if it begins to fall off, or if it is dirty or damaged before the third day.        09/22/23 1007            Follow-up Information     [name], [name]. Schedule an appointment as soon as possible for a visit in 2 week(s).   Specialty: General Practice Why: for hospital follow-up Contact information: [address] [place] [address] [phone]         Wyn Jackee VEAR Mickey., NP Follow up on 10/04/2023.   Specialty: Cardiology Why: at 10:05 am, for hospital follow-up Contact information: [address]  [place] [address] [phone]                Discharge Exam: Filed Weights   09/17/23 1601 09/17/23 2145 09/22/23 0618  Weight: 75 kg 72.9 kg 69.7 kg   S: Sitting on the edge of the bed, on his phone, wants to go home today.  Feels a lot better.  BP (!) 159/88 (BP Location: Right Wrist)   Pulse 71   Temp 97.8 F (36.6 C) (Oral)   Resp 20   Ht 5' 7 (1.702 m)   Wt 69.7 kg   SpO2 98%   BMI 24.06 kg/m   Physical Exam General: Alert and oriented x 3, NAD Cardiovascular: S1 S2 clear, RRR.  Respiratory: CTAB, no wheezing Gastrointestinal: Soft, nontender, nondistended, NBS Ext: no pedal edema bilaterally Neuro: no new deficits Psych: Normal affect     Condition at discharge: fair  The results of significant diagnostics from this hospitalization (including imaging, microbiology, ancillary and laboratory) are listed below for reference.   Imaging Studies: DG CHEST PORT 1 VIEW Result Date: 09/22/2023 CLINICAL DATA:  Respiratory failure EXAM: PORTABLE CHEST 1 VIEW COMPARISON:  09/20/2023 FINDINGS: Dual lead pacer  remains in place. The right IJ introducer sheath has been removed. The patient is rotated to the right on today's radiograph, reducing diagnostic sensitivity and specificity. Mild enlargement of the cardiopericardial silhouette with tortuosity of the thoracic aorta. Atherosclerotic calcification of the aortic arch. Bilateral reticular interstitial accentuation with increased hazy airspace opacity in the right upper lobe which could be from asymmetric edema, pneumonia, or atypical pneumonia. Underlying emphysema noted. Indistinct density along the right hemidiaphragm may be from diaphragmatic lobulation or right lower lobe airspace opacity. IMPRESSION: 1. Increased hazy airspace opacity in the right upper lobe which could be from asymmetric edema, pneumonia, or atypical pneumonia. 2. Indistinct density along the right hemidiaphragm may be from diaphragmatic lobulation or right lower lobe airspace opacity. 3. Mild enlargement of the cardiopericardial silhouette. 4. Aortic Atherosclerosis (ICD10-I70.0) and Emphysema (ICD10-J43.9). Electronically Signed   By: Ryan Salvage M.D.   On: 09/22/2023 10:50   DG Chest 2 View Result Date: 09/20/2023 CLINICAL DATA:  Pacemaker placement EXAM: CHEST - 2 VIEW COMPARISON:  09/18/2023 FINDINGS: Dual lead pacer is present with proximal and distal leads projecting over the right atrium and proximal right ventricle presumably near the tricuspid valve. No pneumothorax. Mild to moderate enlargement of the cardiopericardial silhouette noted with indistinct pulmonary vasculature favoring pulmonary venous hypertension. Borderline appearance for interstitial edema but no Kerley B lines are visible. Mild blunting of the costophrenic angles. On the lateral projection, a 4.4 cm nodular density projecting over the lower spine probably represents a confluence of bony findings and pleural fluid given the lack of a corroborating nodular lesion on the frontal projection. Follow up chest  radiography or chest CT should be considered to ensure that there is no underlying pulmonary mass occult on the frontal projection. Vaguely confluent density in the right suprahilar region, possibly from confluent edema less likely to be from a pulmonary nodule, partially obscured on the 09/18/2023 exam due to an ECG lead. IMPRESSION: 1. Dual lead pacer is present with proximal and distal leads projecting over the right atrium and proximal right ventricle presumably near the tricuspid valve. No pneumothorax. 2. Mild to moderate enlargement of the cardiopericardial silhouette with pulmonary venous hypertension and borderline interstitial edema. 3. 4.4 cm nodular density projecting over the lower spine on the lateral projection probably represents a confluence of bony findings  and pleural fluid given the lack of a corroborating nodular lesion on the frontal projection. Follow up chest radiography or chest CT should be considered to ensure that there is no underlying pulmonary mass occult on the frontal projection. 4. Vaguely confluent density in the right suprahilar region, possibly from confluent edema less likely to be from a pulmonary nodule, partially obscured on the 09/18/2023 exam due to a ECG lead. Electronically Signed   By: Ryan Salvage M.D.   On: 09/20/2023 08:59   EP PPM/ICD IMPLANT Result Date: 09/19/2023 SURGEON:  Soyla Norton, MD   PREPROCEDURE DIAGNOSIS:  complete AV block   POSTPROCEDURE DIAGNOSIS:  complete AV block    PROCEDURES:  1. Pacemaker implantation.   INTRODUCTION:  ALEXANDERJAMES BERG is a 73 y.o. male with a history of bradycardia who presents today for pacemaker implantation.  The patient reports intermittent episodes of dizziness over the past few months.  No reversible causes have been identified.  The patient therefore presents today for pacemaker implantation.   DESCRIPTION OF PROCEDURE:  Informed written consent was obtained, and  the patient was brought to the electrophysiology lab  in a fasting state.  The patient required no sedation for the procedure today.  The patients left chest was prepped and draped in the usual sterile fashion by the EP lab staff. The skin overlying the left deltopectoral region was infiltrated with lidocaine  for local analgesia.  A 4-cm incision was made over the left deltopectoral region.  A left subcutaneous pacemaker pocket was fashioned using a combination of sharp and blunt dissection. Electrocautery was required to assure hemostasis.  RA/RV Lead Placement: The left axillary vein was therefore cannulated.  Through the left axillary vein, a Medtronic CapSureFix Novus [id number]  (serial number  [id number]) right atrial lead and an Medtronic SelectSure 3830 (serial number  [id number]) right ventricular lead were advanced with fluoroscopic visualization into the right atrial appendage and left bundle area positions respectively.  Initial atrial lead P- waves measured 1.8 mV with impedance of 813 ohms and a threshold of 2.2 V at 0.5 msec.  Right ventricular lead R-waves measured 6.7 mV with an impedance of 800 ohms and a threshold of 1 V at 0.5 msec.  Both leads were secured to the pectoralis fascia using #2-0 silk over the suture sleeves. Device Placement:  The leads were then connected to an Medtronic Azure XT DR [id number]   (serial number  [id number] ) pacemaker.  The pocket was irrigated with copious gentamicin  solution.  The pacemaker was then placed into the pocket.  The pocket was then closed in 3 layers with 2.0 and 3.0 V-Loc suture for the subcutaneous layers and 3.0 Vicryl suture for the subcuticular layers.  Steri-  Strips and a sterile dressing were then applied. EBL<31ml.  There were no early apparent complications.   CONCLUSIONS:  1. Successful implantation of a Medtronic Azure XT DR [id number]  dual-chamber pacemaker for symptomatic bradycardia  2. No early apparent complications.   Will Norton, MD 09/19/2023 5:47 PM  ECHOCARDIOGRAM COMPLETE Result Date:  09/18/2023    ECHOCARDIOGRAM REPORT   Patient Name:   JOVI ZAVADIL Date of Exam: 09/18/2023 Medical Rec #:  [id number]      Height:       67.0 in Accession #:    [id number]     Weight:       160.7 lb Date of Birth:  1950/09/28      BSA:  1.843 m Patient Age:    73 years       BP:           92/75 mmHg Patient Gender: M              HR:           89 bpm. Exam Location:  Inpatient Procedure: 2D Echo, Cardiac Doppler and Color Doppler (Both Spectral and Color            Flow Doppler were utilized during procedure). Indications:    R94.31 Abnormal EKG. Cardiac arrest.  History:        Patient has no prior history of Echocardiogram examinations.                 Abnormal ECG, Arrythmias:RBBB; Risk Factors:Hypertension,                 Diabetes and Dyslipidemia. Cancer.  Sonographer:    Ellouise Mose RDCS Referring Phys: [id number] JESSICA MARSHALL  Sonographer Comments: Echo performed with patient supine and on artificial respirator. IMPRESSIONS  1. Left ventricular ejection fraction, by estimation, is 55 to 60%. The left ventricle has normal function. The left ventricle has no regional wall motion abnormalities. There is moderate concentric left ventricular hypertrophy of the septal segment. Indeterminate diastolic filling due to E-A fusion.  2. Right ventricular systolic function is normal. The right ventricular size is normal. There is mildly elevated pulmonary artery systolic pressure. The estimated right ventricular systolic pressure is 42.5 mmHg.  3. The mitral valve is normal in structure. Mild mitral valve regurgitation. No evidence of mitral stenosis.  4. Tricuspid valve regurgitation is moderate to severe.  5. The aortic valve is tricuspid. There is mild calcification of the aortic valve. There is mild thickening of the aortic valve. Aortic valve regurgitation is not visualized. Aortic valve sclerosis/calcification is present, without any evidence of aortic stenosis. Comparison(s): No prior Echocardiogram.  FINDINGS  Left Ventricle: Temp pacer in place. Left ventricular ejection fraction, by estimation, is 55 to 60%. The left ventricle has normal function. The left ventricle has no regional wall motion abnormalities. The left ventricular internal cavity size was normal in size. There is moderate concentric left ventricular hypertrophy of the septal segment. Abnormal (paradoxical) septal motion, consistent with RV pacemaker. Indeterminate diastolic filling due to E-A fusion. Right Ventricle: The right ventricular size is normal. No increase in right ventricular wall thickness. Right ventricular systolic function is normal. There is mildly elevated pulmonary artery systolic pressure. The tricuspid regurgitant velocity is 2.85  m/s, and with an assumed right atrial pressure of 10 mmHg, the estimated right ventricular systolic pressure is 42.5 mmHg. Left Atrium: Left atrial size was normal in size. Right Atrium: Right atrial size was normal in size. Pericardium: There is no evidence of pericardial effusion. Mitral Valve: The mitral valve is normal in structure. Mild mitral valve regurgitation. No evidence of mitral valve stenosis. Tricuspid Valve: The tricuspid valve is normal in structure. Tricuspid valve regurgitation is moderate to severe. No evidence of tricuspid stenosis. Aortic Valve: The aortic valve is tricuspid. There is mild calcification of the aortic valve. There is mild thickening of the aortic valve. Aortic valve regurgitation is not visualized. Aortic valve sclerosis/calcification is present, without any evidence of aortic stenosis. Pulmonic Valve: The pulmonic valve was normal in structure. Pulmonic valve regurgitation is not visualized. No evidence of pulmonic stenosis. Aorta: The aortic root is normal in size and structure. Venous: IVC assessment for right atrial pressure unable  to be performed due to mechanical ventilation. IAS/Shunts: No atrial level shunt detected by color flow Doppler. Additional  Comments: A device lead is visualized in the right ventricle.  LEFT VENTRICLE PLAX 2D LVIDd:         3.80 cm     Diastology LVIDs:         2.38 cm     LV e' medial:    7.94 cm/s LV PW:         1.33 cm     LV E/e' medial:  12.5 LV IVS:        1.51 cm     LV e' lateral:   7.72 cm/s LVOT diam:     2.32 cm     LV E/e' lateral: 12.9 LV SV:         82 LV SV Index:   44 LVOT Area:     4.23 cm  LV Volumes (MOD) LV vol d, MOD A2C: 83.0 ml LV vol d, MOD A4C: 92.6 ml LV vol s, MOD A2C: 38.7 ml LV vol s, MOD A4C: 38.9 ml LV SV MOD A2C:     44.3 ml LV SV MOD A4C:     92.6 ml LV SV MOD BP:      51.2 ml RIGHT VENTRICLE             IVC RV S prime:     14.90 cm/s  IVC diam: 2.38 cm TAPSE (M-mode): 2.1 cm LEFT ATRIUM           Index       RIGHT ATRIUM           Index LA diam:      3.49 cm 1.89 cm/m  RA Area:     15.80 cm LA Vol (A2C): 11.3 ml 6.13 ml/m  RA Volume:   38.50 ml  20.89 ml/m LA Vol (A4C): 15.6 ml 8.47 ml/m  AORTIC VALVE LVOT Vmax:   122.00 cm/s LVOT Vmean:  79.900 cm/s LVOT VTI:    0.193 m  AORTA Ao Root diam: 3.90 cm MITRAL VALVE                TRICUSPID VALVE MV Area (PHT): 5.07 cm     TR Peak grad:   32.5 mmHg MV Decel Time: 150 msec     TR Vmax:        285.00 cm/s MV E velocity: 99.35 cm/s MV A velocity: 132.00 cm/s  SHUNTS MV E/A ratio:  0.75         Systemic VTI:  0.19 m                             Systemic Diam: 2.32 cm Jerel Croitoru MD Electronically signed by Jerel Balding MD Signature Date/Time: 09/18/2023/1:17:16 PM    Final    CARDIAC CATHETERIZATION Result Date: 09/18/2023 Successful placement of R internal jugular pacing wire for AV block, recent VF arrest Isoproterenol  turned off at the conclusion of the case   EEG adult Result Date: 09/18/2023 Shelton Arlin KIDD, MD     09/18/2023  8:54 AM Patient Name: BRAYDYN SCHULTES MRN: [id number] Epilepsy Attending: Arlin KIDD Shelton Referring Physician/Provider: Layman Raisin, DO Date: 09/17/2023 Duration: 25.30 mins Patient history: 73yo F s/p cardiac arrest.  EEG to evaluate for seizure Level of alertness: comatose AEDs during EEG study: Propofol  Technical aspects: This EEG study was done with scalp electrodes positioned according to the 10-20  International system of electrode placement. Electrical activity was reviewed with band pass filter of 1-70Hz , sensitivity of 7 uV/mm, display speed of 39mm/sec with a 60Hz  notched filter applied as appropriate. EEG data were recorded continuously and digitally stored.  Video monitoring was available and reviewed as appropriate. Description: EEG showed continuous generalized predominantly 5 to 9 Hz theta-alpha activity admixed with intermittent 2-3hz  delta slowing. Hyperventilation and photic stimulation were not performed.   ABNORMALITY - Continuous slow, generalized IMPRESSION: This study is suggestive of moderate diffuse encephalopathy. No seizures or epileptiform discharges were seen throughout the recording. Arlin MALVA Krebs   DG Chest 1 View Result Date: 09/18/2023 EXAM: 1 VIEW XRAY OF THE CHEST 09/18/2023 07:29:00 AM COMPARISON: 09/17/2023 CLINICAL HISTORY: Displacement of central venous catheter (CVC). FINDINGS: LUNGS AND PLEURA: No pneumothorax. Partial clearing of asymmetric opacity within the right upper lobe. HEART AND MEDIASTINUM: No acute abnormality of the cardiac and mediastinal silhouettes. Aortic atherosclerotic calcification. BONES AND SOFT TISSUES: No acute osseous findings. LINES AND TUBES: The endotracheal tube tip is 4.2 cm above the carina. Enteric tube tip courses below the gastroesophageal junction. There is a left internal jugular catheter with tip in the projection of the superior vena cava. IMPRESSION: 1. Partial clearing of asymmetric opacity within the right upper lobe. 2. Interval placement of left IJ catheter with tip in the projection of the SVC. No pneumothorax identified. Electronically signed by: Waddell Calk MD 09/18/2023 07:43 AM EDT RP Workstation: [id number]   DG CHEST PORT 1  VIEW Result Date: 09/17/2023 CLINICAL DATA:  Intubated EXAM: PORTABLE CHEST 1 VIEW COMPARISON:  09/17/2023, CT 03/24/2020 FINDINGS: Endotracheal tube tip is about 3.3 cm superior to carina. Enteric tube tip below the diaphragm but incompletely assessed. Emphysema. Borderline to mild cardiomegaly with aortic atherosclerosis. Interstitial and ground-glass opacity most evident in the right upper lobe. Vague more focal right upper lobe opacity IMPRESSION: 1. Endotracheal tube tip about 3.3 cm superior to carina. 2. Emphysema. Interstitial and ground-glass opacity most evident in the right upper lobe, possible pneumonia. Vague more focal right upper lobe opacity, indeterminate for focal pneumonia, nodule, or sclerotic bone lesion (history of metastatic disease). Chest CT follow-up could be obtained for further evaluation Electronically Signed   By: Luke Bun M.D.   On: 09/17/2023 22:52   DG Chest Port 1 View Result Date: 09/17/2023 CLINICAL DATA:  Intubated, cardiac arrest, history of prostate cancer EXAM: PORTABLE CHEST 1 VIEW COMPARISON:  03/24/2020 FINDINGS: Two frontal views of the chest demonstrate an endotracheal tube overlying tracheal air column, tip approximately 4 cm above carina. Enteric catheter passes below diaphragm, tip projecting over the gastric body. External defibrillator pads are noted. The cardiac silhouette is unremarkable. Diffuse interstitial prominence with bilateral ground-glass airspace disease, greatest in the right upper lobe, compatible with edema. No effusion or pneumothorax. No acute displaced fractures. IMPRESSION: 1. Support devices as above. 2. Interstitial and ground-glass opacities, most pronounced in the right upper lobe, favor asymmetric edema. Electronically Signed   By: Ozell Daring M.D.   On: 09/17/2023 19:30    Microbiology: Results for orders placed or performed during the hospital encounter of 09/17/23  MRSA Next Gen by PCR, Nasal     Status: None   Collection  Time: 09/17/23  9:40 PM   Specimen: Nasal Mucosa; Nasal Swab  Result Value Ref Range Status   MRSA by PCR Next Gen NOT DETECTED NOT DETECTED Final    Comment: (NOTE) The GeneXpert MRSA Assay (FDA approved for NASAL specimens only), is  one component of a comprehensive MRSA colonization surveillance program. It is not intended to diagnose MRSA infection nor to guide or monitor treatment for MRSA infections. Test performance is not FDA approved in patients less than 27 years old. Performed at [facility], [address], [place], [place] [address]   Surgical PCR screen     Status: None   Collection Time: 09/19/23 12:50 AM   Specimen: Nasal Mucosa; Nasal Swab  Result Value Ref Range Status   MRSA, PCR NEGATIVE NEGATIVE Final   Staphylococcus aureus NEGATIVE NEGATIVE Final    Comment: (NOTE) The Xpert SA Assay (FDA approved for NASAL specimens in patients 31 years of age and older), is one component of a comprehensive surveillance program. It is not intended to diagnose infection nor to guide or monitor treatment. Performed at [facility], [address] [place], [place] [address]     Labs: CBC: Recent Labs  Lab 09/17/23 1327 09/17/23 1605 09/17/23 1814 09/17/23 2215 09/19/23 0434 09/19/23 0939 09/20/23 0232 09/21/23 0450 09/22/23 0604  WBC 6.9  --  6.7   < > 12.8* 12.9* 9.7 11.9* 8.3  NEUTROABS 4.6  --  4.6  --   --   --   --   --   --   HGB 14.1   < > 13.4   < > 10.8* 11.2* 10.4* 12.5* 12.5*  HCT 42.4   < > 40.3   < > 32.2* 34.4* 32.1* 37.0* 37.5*  MCV 107.1*   < > 106.6*   < > 105.2* 105.2* 105.6* 102.5* 103.6*  PLT 202   < > 186   < > 137* 145* 129* 145* 162   < > = values in this interval not displayed.   Basic Metabolic Panel: Recent Labs  Lab 09/18/23 0125 09/18/23 0323 09/19/23 0434 09/19/23 0939 09/20/23 0232 09/21/23 0450 09/22/23 0604  NA 139   < > 137 138 139 140 137  K 3.3*   < > 4.1 4.1 3.9 4.5 3.9  CL 104   < > 105 106 106 103 102   CO2 17*   < > 23 23 20* 24 23  GLUCOSE 339*   < > 91 80 83 98 95  BUN 24*   < > 28* 30* 27* 27* 22  CREATININE 1.71*   < > 1.53* 1.58* 1.17 1.12 1.01  CALCIUM  8.7*   < > 8.4* 8.4* 7.9* 8.4* 8.2*  MG 1.6*  --  2.3  --  2.1 1.9 1.8   < > = values in this interval not displayed.   Liver Function Tests: Recent Labs  Lab 09/19/23 0434 09/19/23 0939 09/20/23 0232 09/21/23 0450 09/22/23 0604  AST 251* 221* 129* 71* 37  ALT 370* 366* 268* 200* 122*  ALKPHOS 48 53 78 66 61  BILITOT 1.0 1.1 0.9 1.1 0.8  PROT 5.4* 5.6* 5.5* 7.1 6.7  ALBUMIN 2.4* 2.6* 2.5* 3.1* 3.0*   CBG: Recent Labs  Lab 09/21/23 0743 09/21/23 1308 09/21/23 1611 09/21/23 2102 09/22/23 0810  GLUCAP 95 145* 164* 130* 92    Discharge time spent: greater than 30 minutes.  Signed: Nydia Distance, MD [facility] 09/22/2023

## 2023-09-24 ENCOUNTER — Telehealth: Payer: Self-pay | Admitting: *Deleted

## 2023-09-24 NOTE — Transitions of Care (Post Inpatient/ED Visit) (Signed)
 09/24/2023  Name: Kyle George MRN: [id number] DOB: [birth year]  Today's TOC FU Call Status: Today's TOC FU Call Status:: Successful TOC FU Call Completed TOC FU Call Complete Date: 09/24/23 Patient's Name and Date of Birth confirmed.  Transition Care Management Follow-up Telephone Call Date of Discharge: 09/22/23 Discharge Facility: [facility]) Type of Discharge: Inpatient Admission Primary Inpatient Discharge Diagnosis:: Cardiac arrest How have you been since you were released from the hospital?: Better Any questions or concerns?: No  Items Reviewed: Did you receive and understand the discharge instructions provided?: Yes Medications obtained,verified, and reconciled?: Yes (Medications Reviewed) Any new allergies since your discharge?: No Dietary orders reviewed?: Yes Type of Diet Ordered:: Carb Modified, heart healthy Do you have support at home?: Yes People in Home [RPT]: spouse Name of Support/Comfort Primary Source: Spouse/Patricia  Medications Reviewed Today: Medications Reviewed Today     Reviewed by Lucky Andrea LABOR, RN (Registered Nurse) on 09/24/23 at 1204  Med List Status: <None>   Medication Order Taking? Sig Documenting Provider Last Dose Status Informant  albuterol  (VENTOLIN  HFA) 108 (90 Base) MCG/ACT inhaler [id number] Yes Inhale 2 puffs into the lungs every 6 (six) hours as needed for wheezing or shortness of breath. Rai, Nydia POUR, MD  Active   amoxicillin -clavulanate (AUGMENTIN ) 875-125 MG tablet [id number] Yes Take 1 tablet by mouth 2 (two) times daily for 2 days. Davia Nydia POUR, MD  Active   aspirin 81 MG tablet [id number] Yes Take 81 mg by mouth daily. [provider]  Active Spouse/Significant Other, Pharmacy Records  Calcium  Carbonate-Vitamin D  (CALCIUM  600+D PO) [id number] Yes Take 1 tablet by mouth daily. Taking calcium  + D3 [provider]  Active Spouse/Significant Other, Pharmacy Records  cholecalciferol (VITAMIN D ) 1000 UNITS  tablet [id number] Yes Take 5,000 Units by mouth daily. [provider]  Active Spouse/Significant Other, Pharmacy Records  enzalutamide  (XTANDI ) 40 MG tablet [id number] Yes TAKE 4 TABLETS (160 MG TOTAL) BY MOUTH DAILY. Lamon Pleasant HERO, PA-C  Active Spouse/Significant Other, Pharmacy Records  lidocaine  4 % [id number] Yes Place 1 patch onto the skin daily. Remove & Discard patch within 12 hours or as directed by MD. Apply to sternum. Rai, Nydia POUR, MD  Active   methocarbamol  (ROBAXIN ) 500 MG tablet [id number] Yes Take 1 tablet (500 mg total) by mouth 3 (three) times daily. Rai, Nydia POUR, MD  Active   Olmesartan -amLODIPine -HCTZ 40-10-25 MG TABS [id number] Yes TAKE 1 TABLET BY MOUTH EVERY DAY [name], [name]  Active Spouse/Significant Other, Pharmacy Records  Omega-3 Fatty Acids (FISH OIL PO) [id number] Yes Take 1 tablet by mouth daily. [provider]  Active Spouse/Significant Other, Pharmacy Records  oxyCODONE  (OXY IR/ROXICODONE ) 5 MG immediate release tablet [id number] Yes Take 1 tablet (5 mg total) by mouth every 6 (six) hours as needed for moderate pain (pain score 4-6) or severe pain (pain score 7-10). Rai, Nydia POUR, MD  Active   pioglitazone -metformin  (ACTOPLUS MET ) 15-850 MG tablet [id number] Yes Take 1 tablet by mouth 2 (two) times daily. [name], [name]  Active Spouse/Significant Other, Pharmacy Records  predniSONE  (DELTASONE ) 20 MG tablet [id number] Yes Take 2 tablets (40 mg total) by mouth daily with breakfast for 3 days. Davia Nydia POUR, MD  Active   rosuvastatin  (CRESTOR ) 10 MG tablet [id number] Yes TAKE 1 TABLET BY MOUTH EVERY DAY [name], [name]  Active Spouse/Significant Other, Pharmacy Records           Med Note STEFFI NIAN   Tue Sep 18, 2023 10:39  AM) Family is uncertain of this medication but believes he is taking   Semaglutide ,0.25 or 0.5MG /DOS, (OZEMPIC , 0.25 OR 0.5 MG/DOSE,) 2 MG/3ML SOPN [id number] Yes INJECT 0.25 MG WEEKLY FOR 4 WEEKS, THEN  INCREASE TO 0.5 MG WEEKLY [name], [name]  Active Spouse/Significant Other, Pharmacy Records  senna-docusate (SENOKOT-S) 8.6-50 MG tablet [id number]  Take 2 tablets by mouth at bedtime. For constipation  Patient not taking: Reported on 09/24/2023   Davia Nydia POUR, MD  Active   spironolactone  (ALDACTONE ) 25 MG tablet [id number] Yes TAKE 1 TABLET (25 MG TOTAL) BY MOUTH DAILY. [name], [name]  Active Spouse/Significant Other, Pharmacy Records  Tiotropium Bromide -Olodaterol 2.5-2.5 MCG/ACT AERS [id number] Yes Inhale 2 puffs into the lungs daily. Davia Nydia POUR, MD  Active             Home Care and Equipment/Supplies: Were Home Health Services Ordered?: No Any new equipment or medical supplies ordered?: No  Functional Questionnaire: Do you need assistance with bathing/showering or dressing?: No Do you need assistance with meal preparation?: No Do you need assistance with eating?: No Do you have difficulty maintaining continence: No Do you need assistance with getting out of bed/getting out of a chair/moving?: No Do you have difficulty managing or taking your medications?: No  Follow up appointments reviewed: PCP Follow-up appointment confirmed?: Yes Date of PCP follow-up appointment?: 10/03/23 Follow-up Provider: Speaks Georgina, NP Specialist Hospital Follow-up appointment confirmed?: Yes Date of Specialist follow-up appointment?: 10/04/23 Follow-Up Specialty Provider:: Dr. Jackee Alberts Do you need transportation to your follow-up appointment?: No Do you understand care options if your condition(s) worsen?: Yes-patient verbalized understanding  SDOH Interventions Today    Flowsheet Row Most Recent Value  SDOH Interventions   Food Insecurity Interventions Intervention Not Indicated  Housing Interventions Intervention Not Indicated  Transportation Interventions Intervention Not Indicated  Utilities Interventions Intervention Not Indicated    Goals Addressed              This Visit's Progress    VBCI Transitions of Care (TOC) Care Plan       Problems:  Recent Hospitalization for treatment of Cardiac Arrest Equipment/DME barrier patient needing a BP monitor  Goal:  Over the next 30 days, the patient will not experience hospital readmission  Interventions:  Transitions of Care: Durable Medical Equipment (DME) needs assessed with patient/caregiver Doctor Visits  - discussed the importance of doctor visits Post discharge activity limitations prescribed by provider reviewed Post-op wound/incision care reviewed with patient/caregiver Reviewed Signs and symptoms of infection Advised patient to contact his insurance plan to determine if BP monitor is covered Reviewed the importance of exercise Educated on heart healthy, low sodium, carb modified diet Provided MD referral line [phone], patient would like a PCP closer to home  Patient Self Care Activities:  Attend all scheduled provider appointments Call provider office for new concerns or questions  Notify RN Care Manager of [facility] call rescheduling needs Participate in Transition of Care Program/Attend [facility] scheduled calls Take medications as prescribed    Plan:  Telephone follow up appointment with care management team member scheduled for:  10/02/23 at 11am       Discussed and offered 30 day TOC program.  Patient         Lyn[place]      .  The patient has been provided with contact information for the care management team and has been advised to call with any health -related questions or concerns.  The patient verbalized understanding with current plan  of care.  The patient is directed to their insurance card regarding availability of benefits coverage.    Andrea Dimes RN, BSN [place]  [facility] RN Care Manager [phone]

## 2023-09-25 ENCOUNTER — Other Ambulatory Visit: Payer: Self-pay

## 2023-09-25 DIAGNOSIS — C61 Malignant neoplasm of prostate: Secondary | ICD-10-CM

## 2023-09-25 DIAGNOSIS — C7951 Secondary malignant neoplasm of bone: Secondary | ICD-10-CM

## 2023-09-26 ENCOUNTER — Inpatient Hospital Stay

## 2023-09-26 VITALS — BP 150/79 | HR 66 | Temp 97.3°F | Resp 18

## 2023-09-26 DIAGNOSIS — C61 Malignant neoplasm of prostate: Secondary | ICD-10-CM

## 2023-09-26 DIAGNOSIS — R5383 Other fatigue: Secondary | ICD-10-CM | POA: Diagnosis not present

## 2023-09-26 DIAGNOSIS — R001 Bradycardia, unspecified: Secondary | ICD-10-CM | POA: Diagnosis not present

## 2023-09-26 DIAGNOSIS — R232 Flushing: Secondary | ICD-10-CM | POA: Diagnosis not present

## 2023-09-26 DIAGNOSIS — Z7985 Long-term (current) use of injectable non-insulin antidiabetic drugs: Secondary | ICD-10-CM | POA: Diagnosis not present

## 2023-09-26 DIAGNOSIS — I10 Essential (primary) hypertension: Secondary | ICD-10-CM | POA: Diagnosis not present

## 2023-09-26 DIAGNOSIS — Z809 Family history of malignant neoplasm, unspecified: Secondary | ICD-10-CM | POA: Diagnosis not present

## 2023-09-26 DIAGNOSIS — Z79818 Long term (current) use of other agents affecting estrogen receptors and estrogen levels: Secondary | ICD-10-CM | POA: Diagnosis not present

## 2023-09-26 DIAGNOSIS — Z7982 Long term (current) use of aspirin: Secondary | ICD-10-CM | POA: Diagnosis not present

## 2023-09-26 DIAGNOSIS — D7589 Other specified diseases of blood and blood-forming organs: Secondary | ICD-10-CM | POA: Diagnosis not present

## 2023-09-26 DIAGNOSIS — Z192 Hormone resistant malignancy status: Secondary | ICD-10-CM | POA: Diagnosis not present

## 2023-09-26 DIAGNOSIS — E785 Hyperlipidemia, unspecified: Secondary | ICD-10-CM | POA: Diagnosis not present

## 2023-09-26 DIAGNOSIS — C779 Secondary and unspecified malignant neoplasm of lymph node, unspecified: Secondary | ICD-10-CM | POA: Diagnosis not present

## 2023-09-26 DIAGNOSIS — Z79899 Other long term (current) drug therapy: Secondary | ICD-10-CM | POA: Diagnosis not present

## 2023-09-26 DIAGNOSIS — Z7984 Long term (current) use of oral hypoglycemic drugs: Secondary | ICD-10-CM | POA: Diagnosis not present

## 2023-09-26 DIAGNOSIS — C7951 Secondary malignant neoplasm of bone: Secondary | ICD-10-CM | POA: Diagnosis not present

## 2023-09-26 DIAGNOSIS — Z87891 Personal history of nicotine dependence: Secondary | ICD-10-CM | POA: Diagnosis not present

## 2023-09-26 LAB — COMPREHENSIVE METABOLIC PANEL WITH GFR
ALT: 45 U/L — ABNORMAL HIGH (ref 0–44)
AST: 19 U/L (ref 15–41)
Albumin: 3.2 g/dL — ABNORMAL LOW (ref 3.5–5.0)
Alkaline Phosphatase: 59 U/L (ref 38–126)
Anion gap: 15 (ref 5–15)
BUN: 28 mg/dL — ABNORMAL HIGH (ref 8–23)
CO2: 22 mmol/L (ref 22–32)
Calcium: 9.5 mg/dL (ref 8.9–10.3)
Chloride: 103 mmol/L (ref 98–111)
Creatinine, Ser: 1.02 mg/dL (ref 0.61–1.24)
GFR, Estimated: >60 mL/min (ref >60)
Glucose, Bld: 78 mg/dL (ref 70–99)
Potassium: 4.1 mmol/L (ref 3.5–5.1)
Sodium: 140 mmol/L (ref 135–145)
Total Bilirubin: 0.7 mg/dL (ref 0.0–1.2)
Total Protein: 7 g/dL (ref 6.5–8.1)

## 2023-09-26 MED ORDER — DENOSUMAB 120 MG/1.7ML ~~LOC~~ SOLN
120.0000 mg | Freq: Once | SUBCUTANEOUS | Status: AC
  Administered 2023-09-26: 120 mg via SUBCUTANEOUS
  Filled 2023-09-26: qty 1.7

## 2023-09-26 MED ORDER — LEUPROLIDE ACETATE (4 MONTH) 30 MG ~~LOC~~ KIT
30.0000 mg | PACK | Freq: Once | SUBCUTANEOUS | Status: AC
  Administered 2023-09-26: 30 mg via SUBCUTANEOUS
  Filled 2023-09-26: qty 30

## 2023-09-26 NOTE — Patient Instructions (Signed)
 [facility] - A DEPT OF [facility]  Discharge Instructions: Thank you for choosing [facility] to provide your oncology and hematology care.  If you have a lab appointment with the Cancer Center - please note that after April 8th, 2024, all labs will be drawn in the cancer center.  You do not have to check in or register with the main entrance as you have in the past but will complete your check-in in the cancer center.  Wear comfortable clothing and clothing appropriate for easy access to any Portacath or PICC line.   We strive to give you quality time with your provider. You may need to reschedule your appointment if you arrive late (15 or more minutes).  Arriving late affects you and other patients whose appointments are after yours.  Also, if you miss three or more appointments without notifying the office, you may be dismissed from the clinic at the provider's discretion.      For prescription refill requests, have your pharmacy contact our office and allow 72 hours for refills to be completed.    Today you received the following chemotherapy and/or immunotherapy agents Eligard  and Xgeva , return as scheduled.   To help prevent nausea and vomiting after your treatment, we encourage you to take your nausea medication as directed.  BELOW ARE SYMPTOMS THAT SHOULD BE REPORTED IMMEDIATELY: *FEVER GREATER THAN 100.4 F (38 C) OR HIGHER *CHILLS OR SWEATING *NAUSEA AND VOMITING THAT IS NOT CONTROLLED WITH YOUR NAUSEA MEDICATION *UNUSUAL SHORTNESS OF BREATH *UNUSUAL BRUISING OR BLEEDING *URINARY PROBLEMS (pain or burning when urinating, or frequent urination) *BOWEL PROBLEMS (unusual diarrhea, constipation, pain near the anus) TENDERNESS IN MOUTH AND THROAT WITH OR WITHOUT PRESENCE OF ULCERS (sore throat, sores in mouth, or a toothache) UNUSUAL RASH, SWELLING OR PAIN  UNUSUAL VAGINAL DISCHARGE OR ITCHING   Items with * indicate a potential  emergency and should be followed up as soon as possible or go to the Emergency Department if any problems should occur.  Please show the CHEMOTHERAPY ALERT CARD or IMMUNOTHERAPY ALERT CARD at check-in to the Emergency Department and triage nurse.  Should you have questions after your visit or need to cancel or reschedule your appointment, please contact [facility]  - A DEPT OF [facility] [phone]  and follow the prompts.  Office hours are 8:00 a.m. to 4:30 p.m. Monday - Friday. Please note that voicemails left after 4:00 p.m. may not be returned until the following business day.  We are closed weekends and major holidays. You have access to a nurse at all times for urgent questions. Please call the main number to the clinic [phone] and follow the prompts.  For any non-urgent questions, you may also contact your provider using MyChart. We now offer e-Visits for anyone 29 and older to request care online for non-urgent symptoms. For details visit [email or web address].   Also download the MyChart app! Go to the app store, search MyChart, open the app, select [place], and log in with your MyChart username and password.

## 2023-09-26 NOTE — Progress Notes (Signed)
 Patient tolerated Eligard and B12 injection with no complaints voiced. Site clean and dry with no bruising or swelling noted at site. See MAR for details. Band aid applied.  Patient stable during and after injection. VSS with discharge and left in satisfactory condition with no s/s of distress noted.

## 2023-09-30 ENCOUNTER — Other Ambulatory Visit: Payer: Self-pay | Admitting: Nurse Practitioner

## 2023-10-02 ENCOUNTER — Other Ambulatory Visit: Payer: Self-pay | Admitting: *Deleted

## 2023-10-02 NOTE — Patient Instructions (Signed)
 Visit Information  Thank you for taking time to visit with me today. Please don't hesitate to contact me if I can be of assistance to you before our next scheduled telephone appointment.   Following is a copy of your care plan:   Goals Addressed             This Visit's Progress    VBCI Transitions of Care (TOC) Care Plan       Problems:  Recent Hospitalization for treatment of Cardiac Arrest Equipment/DME barrier patient needing a BP monitor  Goal:  Over the next 30 days, the patient will not experience hospital readmission  Interventions:  Transitions of Care: Durable Medical Equipment (DME) needs assessed with patient/caregiver Doctor Visits  - discussed the importance of doctor visits Post discharge activity limitations prescribed by provider reviewed Post-op wound/incision care reviewed with patient/caregiver Reviewed Signs and symptoms of infection Advised patient to contact his insurance plan to determine if BP monitor is covered-revisited Reviewed the importance of exercise Educated on heart healthy, low sodium, carb modified diet Provided MD referral line [phone], patient would like a PCP closer to home-revisited Reviewed discharge activity and wound care instructions Advised patient to take all medications to upcoming provider appointments Discussed patient having Oxycodone  for pain-patient has not been taking this medication  Patient Self Care Activities:  Attend all scheduled provider appointments Call provider office for new concerns or questions  Notify RN Care Manager of [facility] call rescheduling needs Participate in Transition of Care Program/Attend TOC scheduled calls Take medications as prescribed    Plan:  Telephone follow up appointment with care management team member scheduled for:  10/09/23 at 12:45pm        The patient verbalized understanding of instructions, educational materials, and care plan provided today and agreed to receive a mailed  copy of patient instructions, educational materials, and care plan.   Telephone follow up appointment with care management team member scheduled for:10/09/23 at 12:45pm  Please call the care guide team at [phone] if you need to cancel or reschedule your appointment.   Please call 1-800-273-TALK (toll free, 24 hour hotline) call 911 if you are experiencing a Mental Health or Behavioral Health Crisis or need someone to talk to.  Andrea Dimes RN, BSN   [facility] RN Care Manager [phone]

## 2023-10-02 NOTE — Transitions of Care (Post Inpatient/ED Visit) (Signed)
 Transition of Care week 2  Visit Note  10/02/2023  Name: Kyle George MRN: [id number]          DOB: Aug 08, 1950  Situation: Patient enrolled in [facility] 30-day program. Visit completed with Mr. Birkel by telephone.   Background:   Initial Transition Care Management Follow-up Telephone Call    Past Medical History:  Diagnosis Date   Cancer of prostate (HCC)    Diabetes mellitus without complication (HCC)    Hyperlipidemia    Hypertension     Assessment: Patient Reported Symptoms: Cognitive Cognitive Status: Able to follow simple commands, Alert and oriented to person, place, and time, Normal speech and language skills, Insightful and able to interpret abstract concepts      Neurological Neurological Review of Symptoms: No symptoms reported    HEENT HEENT Symptoms Reported: No symptoms reported      Cardiovascular Cardiovascular Symptoms Reported: Swelling in legs or feet (sternum pain) Is patient checking Blood Pressure at home?: No Cardiovascular Management Strategies: Medication therapy, Routine screening, Diet modification, Adequate rest, Activity, Coping strategies Cardiovascular Self-Management Outcome: 4 (good) Cardiovascular Comment: Follow up with Cardiology 10/04/23. Sleeping about 4 hours and waking with sternum pain. Mild swelling noted to bilat ankles/feet. Patient has started wearing compression socks. RNCM advised to check weight daily, notify provider if 3# weight gain overnight or 5# weight gain in one week.  Respiratory Respiratory Symptoms Reported: No symptoms reported Respiratory Self-Management Outcome: 4 (good)  Endocrine Endocrine Symptoms Reported: Not assessed    Gastrointestinal Gastrointestinal Symptoms Reported: No symptoms reported      Genitourinary Genitourinary Symptoms Reported: No symptoms reported    Integumentary Integumentary Symptoms Reported: Wound Additional Integumentary Details: denies bleeding or drainage from incision site. RNCM  reviewed instructions for dressing and steri strips. Skin Management Strategies: Coping strategies Skin Self-Management Outcome: 4 (good)  Musculoskeletal Musculoskelatal Symptoms Reviewed: Weakness Musculoskeletal Management Strategies: Exercise, Routine screening Musculoskeletal Self-Management Outcome: 4 (good)      Psychosocial Psychosocial Symptoms Reported: Not assessed         There were no vitals filed for this visit.  Medications Reviewed Today     Reviewed by Lucky Andrea LABOR, RN (Registered Nurse) on 10/02/23 at 1126  Med List Status: <None>   Medication Order Taking? Sig Documenting Provider Last Dose Status Informant  albuterol  (VENTOLIN  HFA) 108 (90 Base) MCG/ACT inhaler [id number] Yes Inhale 2 puffs into the lungs every 6 (six) hours as needed for wheezing or shortness of breath. Davia Nydia POUR, MD  Active   aspirin 81 MG tablet [id number] Yes Take 81 mg by mouth daily. [provider]  Active Spouse/Significant Other, Pharmacy Records  Calcium  Carbonate-Vitamin D  (CALCIUM  600+D PO) [id number] Yes Take 1 tablet by mouth daily. Taking calcium  + D3 [provider]  Active Spouse/Significant Other, Pharmacy Records  carvedilol  (COREG ) 6.25 MG tablet [id number]  Take 6.25 mg by mouth 2 (two) times daily.  Patient not taking: Reported on 10/02/2023   [provider]  Active   cholecalciferol (VITAMIN D ) 1000 UNITS tablet [id number] Yes Take 5,000 Units by mouth daily. [provider]  Active Spouse/Significant Other, Pharmacy Records  enzalutamide  (XTANDI ) 40 MG tablet [id number] Yes TAKE 4 TABLETS (160 MG TOTAL) BY MOUTH DAILY. Lamon Pleasant HERO, PA-C  Active Spouse/Significant Other, Pharmacy Records  lidocaine  4 % [id number] Yes Place 1 patch onto the skin daily. Remove & Discard patch within 12 hours or as directed by MD. Apply to sternum. Davia Nydia POUR, MD  Active  methocarbamol  (ROBAXIN ) 500 MG tablet [id number] Yes Take 1 tablet (500 mg  total) by mouth 3 (three) times daily. Rai, Nydia POUR, MD  Active   Olmesartan -amLODIPine -HCTZ 40-10-25 MG TABS [id number] Yes TAKE 1 TABLET BY MOUTH EVERY DAY [name], [name]  Active Spouse/Significant Other, Pharmacy Records  Omega-3 Fatty Acids (FISH OIL PO) [id number] Yes Take 1 tablet by mouth daily. [provider]  Active Spouse/Significant Other, Pharmacy Records  oxyCODONE  (OXY IR/ROXICODONE ) 5 MG immediate release tablet [id number]  Take 1 tablet (5 mg total) by mouth every 6 (six) hours as needed for moderate pain (pain score 4-6) or severe pain (pain score 7-10).  Patient not taking: Reported on 10/02/2023   Davia Nydia POUR, MD  Active   pioglitazone -metformin  (ACTOPLUS MET ) 15-850 MG tablet [id number] Yes Take 1 tablet by mouth 2 (two) times daily. [name], [name]  Active Spouse/Significant Other, Pharmacy Records  rosuvastatin  (CRESTOR ) 10 MG tablet [id number] Yes TAKE 1 TABLET BY MOUTH EVERY DAY [name], [name]  Active Spouse/Significant Other, Pharmacy Records           Med Note STEFFI, [place]   Tue Sep 18, 2023 10:39 AM) Family is uncertain of this medication but believes he is taking   Semaglutide ,0.25 or 0.5MG /DOS, (OZEMPIC , 0.25 OR 0.5 MG/DOSE,) 2 MG/3ML SOPN [id number]  INJECT 0.25 MG WEEKLY FOR 4 WEEKS, THEN INCREASE TO 0.5 MG WEEKLY  Patient not taking: Reported on 10/02/2023   [name], [name]  Active   senna-docusate (SENOKOT-S) 8.6-50 MG tablet [id number]  Take 2 tablets by mouth at bedtime. For constipation  Patient not taking: Reported on 10/02/2023   Davia Nydia POUR, MD  Active   spironolactone  (ALDACTONE ) 25 MG tablet [id number] Yes TAKE 1 TABLET (25 MG TOTAL) BY MOUTH DAILY. [name], [name]  Active Spouse/Significant Other, Pharmacy Records  Tiotropium Bromide -Olodaterol 2.5-2.5 MCG/ACT AERS [id number] Yes Inhale 2 puffs into the lungs daily. Davia Nydia POUR, MD  Active             Recommendation:   Continue Current Plan of Care  Follow Up  Plan:   Telephone follow-up in 1 week  Andrea Dimes RN, BSN [place]  [facility] Institute [facility] RN Care Manager [phone]

## 2023-10-03 ENCOUNTER — Telehealth: Payer: Self-pay

## 2023-10-03 ENCOUNTER — Ambulatory Visit: Payer: Self-pay | Admitting: Nurse Practitioner

## 2023-10-03 NOTE — Progress Notes (Unsigned)
 Cardiology Office Note    Patient Name: Kyle George Date of Encounter: 10/03/2023  Primary Care Provider:  [name], [name] Primary Cardiologist:  None Primary Electrophysiologist: Will Gladis Norton, MD   Past Medical History    Past Medical History:  Diagnosis Date   Cancer of prostate [facility])    Diabetes mellitus without complication (HCC)    Hyperlipidemia    Hypertension     History of Present Illness  Kyle George is a 73 y.o. 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  Kyle George presented to the ED at [facility] on 09/17/2023 from the oncology center with concerns of bradycardia.  He had carvedilol  discontinued 2 weeks prior but bradycardia persisted.  He suffered a VF arrest and was defibrillated x 4 with ROSC and intubated with Amio and lidocaine  drips started.  He was transported to the ED at [facility] for further evaluation.  EKG showed third-degree AVB with heart rate in the 50s isoproterenol  was started on multiple vasopressors.  He was taken to the Cath Lab and had temp wire placed successfully.  2D echo was completed of 55 to 60% with no RWMA and moderate LVH with estimated E-A fusion with mildly elevated PASP and mild MVR with moderate to severe TR.  He was consulted by EP and underwent placement of a dual-chamber Medtronic PPM by Dr. Norton.  He had improvement of blood pressure with amlodipine , HCTZ irbesartan  and spironolactone .  He suffered no rib fractures or pneumothorax and was given lidocaine  packs with Robaxin  and as needed oxycodone .  He was discharged on 09/22/2023.   Patient denies chest pain, palpitations, dyspnea, PND, orthopnea, nausea, vomiting, dizziness, syncope, edema, weight gain, or early satiety.   Discussed the use of AI scribe software for clinical note transcription with the patient, who gave verbal consent to  proceed.  History of Present Illness    ***Notes:   Review of Systems  Please see the history of present illness.    All other systems reviewed and are otherwise negative except as noted above.  Physical Exam    Wt Readings from Last 3 Encounters:  09/22/23 153 lb 9.6 oz (69.7 kg)  09/17/23 165 lb 12.6 oz (75.2 kg)  08/29/23 169 lb 6.4 oz (76.8 kg)   CD:Uyzmz were no vitals filed for this visit.,There is no height or weight on file to calculate BMI. GEN: Well nourished, well developed in no acute distress Neck: No JVD; No carotid bruits Pulmonary: Clear to auscultation without rales, wheezing or rhonchi  Cardiovascular: Normal rate. Regular rhythm. Normal S1. Normal S2.   Murmurs: There is no murmur.  ABDOMEN: Soft, non-tender, non-distended EXTREMITIES:  No edema; No deformity   EKG/LABS/ Recent Cardiac Studies   ECG personally reviewed by me today - ***  Risk Assessment/Calculations:   {Does this patient have ATRIAL FIBRILLATION?:[phone]}      Lab Results  Component Value Date   WBC 8.3 09/22/2023   HGB 12.5 (L) 09/22/2023   HCT 37.5 (L) 09/22/2023   MCV 103.6 (H) 09/22/2023   PLT 162 09/22/2023   Lab Results  Component Value Date   CREATININE 1.02 09/26/2023   BUN 28 (H) 09/26/2023   NA 140 09/26/2023   K 4.1 09/26/2023   CL 103 09/26/2023   CO2 22 09/26/2023   Lab Results  Component Value Date  CHOL 143 08/06/2023   HDL 75 08/06/2023   LDLCALC 56 08/06/2023   TRIG 68 09/18/2023   CHOLHDL 1.9 08/06/2023    Lab Results  Component Value Date   HGBA1C 5.3 08/06/2023   Assessment & Plan    Assessment and Plan Assessment & Plan     1.  Cardiac arrest/VF: -s/p VF arrest with defibrillation x 4 with ROSC and intubation found to have CHB and underwent PPM placement  2.  History of CHB  3.  Essential HTN  4.  Moderate to severe TR  5.  History of prostate CA      Disposition: Follow-up with None or APP in *** months {Are you  ordering a CV Procedure (e.g. stress test, cath, DCCV, TEE, etc)?   Press F2        :[id number]}   Signed, Wyn Raddle, Jackee Shove, NP 10/03/2023, 5:56 PM [facility]

## 2023-10-03 NOTE — Telephone Encounter (Signed)
 Copied from CRM [id number]. Topic: Appointments - Transfer of Care >> Oct 03, 2023  8:30 AM Dawna HERO wrote: Pt is requesting to transfer FROM: [name],Janece,[name] Pt is requesting to transfer TO: [facility] Reason for requested transfer: negligence  It is the responsibility of the team the patient would like to transfer to (Dr. Gloria zarwolo) to reach out to the patient if for any reason this transfer is not acceptable.

## 2023-10-04 ENCOUNTER — Ambulatory Visit: Attending: Nurse Practitioner | Admitting: Nurse Practitioner

## 2023-10-04 ENCOUNTER — Encounter: Payer: Self-pay | Admitting: Nurse Practitioner

## 2023-10-04 ENCOUNTER — Ambulatory Visit (INDEPENDENT_AMBULATORY_CARE_PROVIDER_SITE_OTHER)

## 2023-10-04 ENCOUNTER — Ambulatory Visit

## 2023-10-04 VITALS — BP 120/78 | HR 74 | Ht 68.0 in | Wt 169.8 lb

## 2023-10-04 DIAGNOSIS — I119 Hypertensive heart disease without heart failure: Secondary | ICD-10-CM | POA: Diagnosis not present

## 2023-10-04 DIAGNOSIS — C61 Malignant neoplasm of prostate: Secondary | ICD-10-CM

## 2023-10-04 DIAGNOSIS — Z720 Tobacco use: Secondary | ICD-10-CM | POA: Diagnosis not present

## 2023-10-04 DIAGNOSIS — I4901 Ventricular fibrillation: Secondary | ICD-10-CM | POA: Diagnosis not present

## 2023-10-04 DIAGNOSIS — I442 Atrioventricular block, complete: Secondary | ICD-10-CM | POA: Diagnosis not present

## 2023-10-04 DIAGNOSIS — I361 Nonrheumatic tricuspid (valve) insufficiency: Secondary | ICD-10-CM | POA: Diagnosis not present

## 2023-10-04 DIAGNOSIS — I469 Cardiac arrest, cause unspecified: Secondary | ICD-10-CM | POA: Diagnosis not present

## 2023-10-04 NOTE — Patient Instructions (Signed)
 Medication Instructions:  Stop Coreg  (carvedilol ) *If you need a refill on your cardiac medications before your next appointment, please call your pharmacy*  Lab Work: None ordered If you have labs (blood work) drawn today and your tests are completely normal, you will receive your results only by: MyChart Message (if you have MyChart) OR A paper copy in the mail If you have any lab test that is abnormal or we need to change your treatment, we will call you to review the results.  Testing/Procedures: None ordered  Follow-Up: At [facility], you and your health needs are our priority.  As part of our continuing mission to provide you with exceptional heart care, our providers are all part of one team.  This team includes your primary Cardiologist (physician) and Advanced Practice Providers or APPs (Physician Assistants and Nurse Practitioners) who all work together to provide you with the care you need, when you need it.  Your next appointment:   6 month(s)  Provider:   You may see Dorn Ross, MD or doctor accepting new patients  or one of the following Advanced Practice Providers on your designated Care Team:   Laymon Qua, PA-C  [place]ia [place], [place] Olivia Pavy, [place]     We recommend signing up for the patient portal called MyChart.  Sign up information is provided on this After Visit Summary.  MyChart is used to connect with patients for Virtual Visits (Telemedicine).  Patients are able to view lab/test results, encounter notes, upcoming appointments, etc.  Non-urgent messages can be sent to your provider as well.   To learn more about what you can do with MyChart, go to [email or web address].   Other Instructions 1-800-QUIT-NOW

## 2023-10-04 NOTE — Progress Notes (Signed)
 Normal dual chamber pacemaker wound check. Presenting rhythm: dual. Wound well healed. Routine testing performed. Thresholds, sensing, and impedance consistent with implant measurements and at 3.5V safety margin/auto capture until 3 month visit. No episodes. Reviewed arm restrictions to continue for 6 weeks total post op.  Pt enrolled in remote follow-up.

## 2023-10-04 NOTE — Patient Instructions (Signed)

## 2023-10-09 ENCOUNTER — Telehealth: Payer: Self-pay | Admitting: *Deleted

## 2023-10-09 ENCOUNTER — Encounter: Payer: Self-pay | Admitting: *Deleted

## 2023-10-10 ENCOUNTER — Telehealth: Payer: Self-pay | Admitting: *Deleted

## 2023-10-10 NOTE — Patient Instructions (Signed)
 Visit Information  Thank you for taking time to visit with me today. Please don't hesitate to contact me if I can be of assistance to you before our next scheduled telephone appointment.   Following is a copy of your care plan:   Goals Addressed             This Visit's Progress    VBCI Transitions of Care (TOC) Care Plan       Problems:  Recent Hospitalization for treatment of Cardiac Arrest Equipment/DME barrier patient needing a BP monitor  Goal:  Over the next 30 days, the patient will not experience hospital readmission  Interventions:  Transitions of Care: Durable Medical Equipment (DME) needs assessed with patient/caregiver Doctor Visits  - discussed the importance of doctor visits Post discharge activity limitations prescribed by provider reviewed Post-op wound/incision care reviewed with patient/caregiver Reviewed Signs and symptoms of infection Advised patient to contact his insurance plan to determine if BP monitor is covered-revisited Reviewed the importance of exercise Educated on heart healthy, low sodium, carb modified diet Provided MD referral line [phone], patient would like a PCP closer to home-revisited Reviewed discharge activity and wound care instructions Advised patient to take all medications to upcoming provider appointments Reviewed provider notes and discussed Reviewed upcoming appointments including: 10/24/23 for lab and [facility] and 10/25/23 with Pulmonology   Patient Self Care Activities:  Attend all scheduled provider appointments Call provider office for new concerns or questions  Notify RN Care Manager of [facility] call rescheduling needs Participate in Transition of Care Program/Attend [facility] scheduled calls Take medications as prescribed    Plan:  Telephone follow up appointment with care management team member scheduled for:  10/16/23 at 1:30pm        Patient verbalizes understanding of instructions and care plan  provided today and agrees to view in MyChart. Active MyChart status and patient understanding of how to access instructions and care plan via MyChart confirmed with patient.     Telephone follow up appointment with care management team member scheduled for:10/16/23 at 1:30pm  Please call the care guide team at [phone] if you need to cancel or reschedule your appointment.   Please call 1-800-273-TALK (toll free, 24 hour hotline) call 911 if you are experiencing a Mental Health or Behavioral Health Crisis or need someone to talk to.  Andrea Dimes RN, BSN [place]  [facility] Health RN Care Manager [phone]

## 2023-10-10 NOTE — Transitions of Care (Post Inpatient/ED Visit) (Signed)
 Transition of Care week 3  Visit Note  10/10/2023  Name: Kyle George MRN: [id number]          DOB: August 14, 1950  Situation: Patient enrolled in [facility] 30-day program. Visit completed with Kyle George by telephone.   Background:   Initial Transition Care Management Follow-up Telephone Call    Past Medical History:  Diagnosis Date   Cancer of prostate (HCC)    Diabetes mellitus without complication (HCC)    Hyperlipidemia    Hypertension     Assessment: Patient Reported Symptoms: Cognitive Cognitive Status: Able to follow simple commands, Alert and oriented to person, place, and time, Normal speech and language skills      Neurological Neurological Review of Symptoms: No symptoms reported    HEENT HEENT Symptoms Reported: Not assessed      Cardiovascular Cardiovascular Symptoms Reported: No symptoms reported Cardiovascular Self-Management Outcome: 4 (good) Cardiovascular Comment: Patient denies any concerns, feels like he is getting back to normal. Patient is walking around in his garden today.  Respiratory Respiratory Symptoms Reported: No symptoms reported    Endocrine Endocrine Symptoms Reported: Not assessed    Gastrointestinal Gastrointestinal Symptoms Reported: No symptoms reported      Genitourinary Genitourinary Symptoms Reported: No symptoms reported    Integumentary Integumentary Symptoms Reported: Wound Additional Integumentary Details: steri strips in place, patient denies any issues or concerns Skin Management Strategies: Routine screening Skin Self-Management Outcome: 4 (good)  Musculoskeletal Musculoskelatal Symptoms Reviewed: Weakness Musculoskeletal Management Strategies: Exercise, Routine screening, Adequate rest Musculoskeletal Self-Management Outcome: 4 (good) Musculoskeletal Comment: Patient feels like he is geting back to normal [place] in the past year?: No    Psychosocial Psychosocial Symptoms Reported: Not assessed         There were no  vitals filed for this visit.  Medications Reviewed Today     Reviewed by Lucky Andrea LABOR, RN (Registered Nurse) on 10/10/23 at 1452  Med List Status: <None>   Medication Order Taking? Sig Documenting Provider Last Dose Status Informant  albuterol  (VENTOLIN  HFA) 108 (90 Base) MCG/ACT inhaler [id number] Yes Inhale 2 puffs into the lungs every 6 (six) hours as needed for wheezing or shortness of breath. Davia Nydia POUR, MD  Active   aspirin 81 MG tablet [id number] Yes Take 81 mg by mouth daily. [provider]  Active Spouse/Significant Other, Pharmacy Records  Calcium  Carbonate-Vitamin D  (CALCIUM  600+D PO) [id number] Yes Take 1 tablet by mouth daily. Taking calcium  + D3 [provider]  Active Spouse/Significant Other, Pharmacy Records  cholecalciferol (VITAMIN D ) 1000 UNITS tablet [id number] Yes Take 5,000 Units by mouth daily. [provider]  Active Spouse/Significant Other, Pharmacy Records  enzalutamide  (XTANDI ) 40 MG tablet [id number] Yes TAKE 4 TABLETS (160 MG TOTAL) BY MOUTH DAILY. Lamon Pleasant HERO, PA-C  Active Spouse/Significant Other, Pharmacy Records  lidocaine  4 % [id number]  Place 1 patch onto the skin daily. Remove & Discard patch within 12 hours or as directed by MD. Apply to sternum.  Patient not taking: Reported on 10/10/2023   Rai, Nydia POUR, MD  Active   methocarbamol  (ROBAXIN ) 500 MG tablet [id number] Yes Take 1 tablet (500 mg total) by mouth 3 (three) times daily. Rai, Nydia POUR, MD  Active   Olmesartan -amLODIPine -HCTZ 40-10-25 MG TABS [id number] Yes TAKE 1 TABLET BY MOUTH EVERY DAY [name], [name]  Active Spouse/Significant Other, Pharmacy Records  Omega-3 Fatty Acids (FISH OIL PO) [id number] Yes Take 1 tablet by mouth daily. [provider]  Active Spouse/Significant Other, Pharmacy Records  oxyCODONE  (OXY IR/ROXICODONE ) 5 MG immediate release tablet [id number]  Take 1 tablet (5 mg total) by mouth every 6 (six) hours as needed for moderate pain  (pain score 4-6) or severe pain (pain score 7-10).  Patient not taking: Reported on 10/10/2023   Rai, Nydia POUR, MD  Active   pioglitazone -metformin  (ACTOPLUS MET ) 15-850 MG tablet [id number] Yes Take 1 tablet by mouth 2 (two) times daily. [name], [name]  Active Spouse/Significant Other, Pharmacy Records  rosuvastatin  (CRESTOR ) 10 MG tablet [id number] Yes TAKE 1 TABLET BY MOUTH EVERY DAY [name], [name]  Active Spouse/Significant Other, Pharmacy Records           Med Note STEFFI, [place]   Tue Sep 18, 2023 10:39 AM) Family is uncertain of this medication but believes he is taking   Semaglutide ,0.25 or 0.5MG /DOS, (OZEMPIC , 0.25 OR 0.5 MG/DOSE,) 2 MG/3ML SOPN [id number]  INJECT 0.25 MG WEEKLY FOR 4 WEEKS, THEN INCREASE TO 0.5 MG WEEKLY  Patient not taking: Reported on 10/10/2023   [name], [name]  Active   senna-docusate (SENOKOT-S) 8.6-50 MG tablet [id number]  Take 2 tablets by mouth at bedtime. For constipation  Patient not taking: Reported on 10/10/2023   Rai, Nydia POUR, MD  Active   spironolactone  (ALDACTONE ) 25 MG tablet [id number] Yes TAKE 1 TABLET (25 MG TOTAL) BY MOUTH DAILY. [name], [name]  Active Spouse/Significant Other, Pharmacy Records  Tiotropium Bromide -Olodaterol 2.5-2.5 MCG/ACT AERS [id number] Yes Inhale 2 puffs into the lungs daily. Davia Nydia POUR, MD  Active             Recommendation:   Continue Current Plan of Care  Follow Up Plan:   Telephone follow-up in 1 week  Andrea Dimes RN, BSN [place]  [facility] Health RN Care Manager [phone]

## 2023-10-11 ENCOUNTER — Other Ambulatory Visit: Payer: Self-pay

## 2023-10-16 ENCOUNTER — Other Ambulatory Visit: Payer: Self-pay | Admitting: *Deleted

## 2023-10-16 ENCOUNTER — Other Ambulatory Visit: Payer: Self-pay

## 2023-10-16 ENCOUNTER — Other Ambulatory Visit: Payer: Self-pay | Admitting: Pharmacy Technician

## 2023-10-16 NOTE — Patient Instructions (Signed)
 Visit Information  Thank you for taking time to visit with me today. Please don't hesitate to contact me if I can be of assistance to you before our next scheduled telephone appointment.   Following is a copy of your care plan:   Goals Addressed             This Visit's Progress    COMPLETED: VBCI Transitions of Care (TOC) Care Plan       Problems:  Recent Hospitalization for treatment of Cardiac Arrest Equipment/DME barrier patient needing a BP monitor  Goal: Met Over the next 30 days, the patient will not experience hospital readmission  Interventions:  Transitions of Care: Durable Medical Equipment (DME) needs assessed with patient/caregiver Doctor Visits  - discussed the importance of doctor visits Post discharge activity limitations prescribed by provider reviewed Post-op wound/incision care reviewed with patient/caregiver Reviewed Signs and symptoms of infection Advised patient to contact his insurance plan to determine if BP monitor is covered-revisited Reviewed the importance of exercise Educated on heart healthy, low sodium, carb modified diet Provided MD referral line [phone], patient would like a PCP closer to home-scheduled with [facility] to establish care on 02/06/24 Advised patient to take all medications to upcoming provider appointments Reviewed upcoming appointments including: 10/24/23 [facility] and 10/25/23 with Pulmonology Discussed referral to Longitudinal Case Management-patient declines     Patient Self Care Activities:  Attend all scheduled provider appointments Call provider office for new concerns or questions  Notify RN Care Manager of TOC call rescheduling needs Participate in Transition of Care Program/Attend TOC scheduled calls Take medications as prescribed    Plan:  No further follow up required:          Patient verbalizes understanding of instructions and care plan provided today and agrees to view in  MyChart. Active MyChart status and patient understanding of how to access instructions and care plan via MyChart confirmed with patient.     No further follow up required:    Please call the care guide team at [phone] if you need to cancel or reschedule your appointment.   Please call 1-800-273-TALK (toll free, 24 hour hotline) call 911 if you are experiencing a Mental Health or Behavioral Health Crisis or need someone to talk to.  Andrea Dimes RN, BSN [place]  Value-Based Care Institute [facility] Health RN Care Manager [phone]

## 2023-10-16 NOTE — Transitions of Care (Post Inpatient/ED Visit) (Signed)
 Transition of Care week 4  Visit Note  10/16/2023  Name: Kyle George MRN: [id number]          DOB: 06/20/1950  Situation: Patient enrolled in [facility] 30-day program. Visit completed with [name] by telephone.   Background:   Initial Transition Care Management Follow-up Telephone Call    Past Medical History:  Diagnosis Date   Cancer of prostate (HCC)    Diabetes mellitus without complication (HCC)    Hyperlipidemia    Hypertension     Assessment: Patient Reported Symptoms: Cognitive Cognitive Status: Able to follow simple commands, Normal speech and language skills, Alert and oriented to person, place, and time      Neurological Neurological Review of Symptoms: No symptoms reported    HEENT HEENT Symptoms Reported: Not assessed      Cardiovascular Cardiovascular Symptoms Reported: No symptoms reported Is patient checking Blood Pressure at home?: No    Respiratory Respiratory Symptoms Reported: No symptoms reported    Endocrine Endocrine Symptoms Reported: Not assessed    Gastrointestinal Gastrointestinal Symptoms Reported: No symptoms reported      Genitourinary Genitourinary Symptoms Reported: No symptoms reported    Integumentary Integumentary Symptoms Reported: Wound Skin Self-Management Outcome: 4 (good)  Musculoskeletal Musculoskelatal Symptoms Reviewed: No symptoms reported Musculoskeletal Self-Management Outcome: 4 (good) Musculoskeletal Comment: Patient is out doing errands, feeling great, denies any concerns      Psychosocial Psychosocial Symptoms Reported: Not assessed         There were no vitals filed for this visit.  Medications Reviewed Today     Reviewed by Lucky Andrea LABOR, RN (Registered Nurse) on 10/16/23 at 1350  Med List Status: <None>   Medication Order Taking? Sig Documenting Provider Last Dose Status Informant  albuterol  (VENTOLIN  HFA) 108 (90 Base) MCG/ACT inhaler [id number] Yes Inhale 2 puffs into the lungs every 6 (six) hours  as needed for wheezing or shortness of breath. Davia Nydia POUR, MD  Active   aspirin 81 MG tablet [id number] Yes Take 81 mg by mouth daily. [provider]  Active Spouse/Significant Other, Pharmacy Records  Calcium  Carbonate-Vitamin D  (CALCIUM  600+D PO) [id number] Yes Take 1 tablet by mouth daily. Taking calcium  + D3 [provider]  Active Spouse/Significant Other, Pharmacy Records  cholecalciferol (VITAMIN D ) 1000 UNITS tablet [id number] Yes Take 5,000 Units by mouth daily. [provider]  Active Spouse/Significant Other, Pharmacy Records  enzalutamide  (XTANDI ) 40 MG tablet [id number] Yes TAKE 4 TABLETS (160 MG TOTAL) BY MOUTH DAILY. Lamon Pleasant HERO, PA-C  Active Spouse/Significant Other, Pharmacy Records  lidocaine  4 % [id number]  Place 1 patch onto the skin daily. Remove & Discard patch within 12 hours or as directed by MD. Apply to sternum.  Patient not taking: Reported on 10/16/2023   Rai, Nydia POUR, MD  Active   methocarbamol  (ROBAXIN ) 500 MG tablet [id number] Yes Take 1 tablet (500 mg total) by mouth 3 (three) times daily. Rai, Nydia POUR, MD  Active   Olmesartan -amLODIPine -HCTZ 40-10-25 MG TABS [id number] Yes TAKE 1 TABLET BY MOUTH EVERY DAY [name], [name]  Active Spouse/Significant Other, Pharmacy Records  Omega-3 Fatty Acids (FISH OIL PO) [id number] Yes Take 1 tablet by mouth daily. [provider]  Active Spouse/Significant Other, Pharmacy Records  oxyCODONE  (OXY IR/ROXICODONE ) 5 MG immediate release tablet [id number]  Take 1 tablet (5 mg total) by mouth every 6 (six) hours as needed for moderate pain (pain score 4-6) or severe pain (pain score 7-10).  Patient not taking: Reported on 10/16/2023  Rai, Nydia POUR, MD  Active   pioglitazone -metformin  (ACTOPLUS MET ) 15-850 MG tablet [id number] Yes Take 1 tablet by mouth 2 (two) times daily. [name], [name]  Active Spouse/Significant Other, Pharmacy Records  rosuvastatin  (CRESTOR ) 10 MG tablet [id number]  Yes TAKE 1 TABLET BY MOUTH EVERY DAY [name], [name]  Active Spouse/Significant Other, Pharmacy Records           Med Note STEFFI, [place]   Tue Sep 18, 2023 10:39 AM) Family is uncertain of this medication but believes he is taking   Semaglutide ,0.25 or 0.5MG /DOS, (OZEMPIC , 0.25 OR 0.5 MG/DOSE,) 2 MG/3ML SOPN [id number]  INJECT 0.25 MG WEEKLY FOR 4 WEEKS, THEN INCREASE TO 0.5 MG WEEKLY  Patient not taking: Reported on 10/16/2023   [name], [name]  Active   senna-docusate (SENOKOT-S) 8.6-50 MG tablet [id number]  Take 2 tablets by mouth at bedtime. For constipation  Patient not taking: Reported on 10/16/2023   Rai, Nydia POUR, MD  Active   spironolactone  (ALDACTONE ) 25 MG tablet [id number] Yes TAKE 1 TABLET (25 MG TOTAL) BY MOUTH DAILY. [name], [name]  Active Spouse/Significant Other, Pharmacy Records  Tiotropium Bromide -Olodaterol 2.5-2.5 MCG/ACT AERS [id number] Yes Inhale 2 puffs into the lungs daily. Davia Nydia POUR, MD  Active             Recommendation:   Continue Current Plan of Care  Follow Up Plan:   Closing From:  Transitions of Care Program  Andrea Dimes RN, BSN   [facility] Health RN Care Manager [phone]

## 2023-10-16 NOTE — Progress Notes (Signed)
 Specialty Pharmacy Refill Coordination Note  Kyle George is a 73 y.o. male contacted today regarding refills of specialty medication(s) Enzalutamide  (XTANDI )   Patient requested Delivery   Delivery date: 10/19/23   Verified address: [address]  [place]   Medication will be filled on 10/18/23.

## 2023-10-17 ENCOUNTER — Other Ambulatory Visit: Payer: Self-pay

## 2023-10-18 ENCOUNTER — Inpatient Hospital Stay

## 2023-10-19 ENCOUNTER — Other Ambulatory Visit: Payer: Self-pay | Admitting: Nurse Practitioner

## 2023-10-19 DIAGNOSIS — E119 Type 2 diabetes mellitus without complications: Secondary | ICD-10-CM

## 2023-10-24 ENCOUNTER — Inpatient Hospital Stay

## 2023-10-24 ENCOUNTER — Inpatient Hospital Stay: Attending: Physician Assistant

## 2023-10-24 VITALS — BP 156/87 | HR 73 | Temp 97.7°F | Resp 18

## 2023-10-24 DIAGNOSIS — C61 Malignant neoplasm of prostate: Secondary | ICD-10-CM

## 2023-10-24 DIAGNOSIS — C779 Secondary and unspecified malignant neoplasm of lymph node, unspecified: Secondary | ICD-10-CM | POA: Insufficient documentation

## 2023-10-24 DIAGNOSIS — C7951 Secondary malignant neoplasm of bone: Secondary | ICD-10-CM | POA: Diagnosis not present

## 2023-10-24 LAB — COMPREHENSIVE METABOLIC PANEL WITH GFR
ALT: 6 U/L (ref 0–44)
AST: 18 U/L (ref 15–41)
Albumin: 4 g/dL (ref 3.5–5.0)
Alkaline Phosphatase: 75 U/L (ref 38–126)
Anion gap: 13 (ref 5–15)
BUN: 19 mg/dL (ref 8–23)
CO2: 23 mmol/L (ref 22–32)
Calcium: 9.5 mg/dL (ref 8.9–10.3)
Chloride: 101 mmol/L (ref 98–111)
Creatinine, Ser: 0.97 mg/dL (ref 0.61–1.24)
GFR, Estimated: >60 mL/min (ref >60)
Glucose, Bld: 77 mg/dL (ref 70–99)
Potassium: 3.7 mmol/L (ref 3.5–5.1)
Sodium: 137 mmol/L (ref 135–145)
Total Bilirubin: 0.3 mg/dL (ref 0.0–1.2)
Total Protein: 7.4 g/dL (ref 6.5–8.1)

## 2023-10-24 MED ORDER — DENOSUMAB 120 MG/1.7ML ~~LOC~~ SOLN
120.0000 mg | Freq: Once | SUBCUTANEOUS | Status: AC
  Administered 2023-10-24: 120 mg via SUBCUTANEOUS
  Filled 2023-10-24: qty 1.7

## 2023-10-24 NOTE — Progress Notes (Signed)
 Patient's Ca 9.5. Per pt he is taking vitamin D  and Calcium  at home. patient tolerated Xgeva  injection with no complaints voiced.  Site clean and dry with no bruising or swelling noted at site.  See MAR for details.  Band aid applied.  Patient stable during and after injection.  Vss with discharge and left in satisfactory condition with no s/s of distress noted. All follow ups as scheduled.   Ercelle Winkles

## 2023-10-25 ENCOUNTER — Ambulatory Visit: Admitting: Internal Medicine

## 2023-10-25 ENCOUNTER — Encounter: Payer: Self-pay | Admitting: Internal Medicine

## 2023-10-25 VITALS — BP 163/90 | HR 71 | Ht 68.0 in | Wt 168.4 lb

## 2023-10-25 DIAGNOSIS — J449 Chronic obstructive pulmonary disease, unspecified: Secondary | ICD-10-CM | POA: Diagnosis not present

## 2023-10-25 DIAGNOSIS — Z87891 Personal history of nicotine dependence: Secondary | ICD-10-CM

## 2023-10-25 NOTE — Progress Notes (Signed)
 Kyle George, male    DOB: 08/08/1950    MRN: [id number]   Brief patient profile:  87  yobm  quit smoking sept 2025   referred to pulmonary clinic in [place]  10/25/2023 by Leita Gaskins for post arrest   Seen by PCCM service p cardiac arrest 09/2023 on vent and started on stiolto at d/c but not sure helping    History of Present Illness  10/25/2023  Pulmonary/ 1st office eval/ [facility] / [place] [facility] / stiolto Chief Complaint  Patient presents with   Establish Care  Dyspnea:  Not limited by breathing from desired activities  - but wasn't the case before admit anyway  Cough: none  Sleep: flat bed / one  SABA use: does not have one 02: none     No obvious day to day or daytime pattern/variability or assoc excess/ purulent sputum or mucus plugs or hemoptysis or cp or chest tightness, subjective wheeze or overt sinus or hb symptoms.    Also denies any obvious fluctuation of symptoms with weather or environmental changes or other aggravating or alleviating factors except as outlined above   No unusual exposure hx or h/o childhood pna/ asthma or knowledge of premature birth.  Current Allergies, Complete Past Medical History, Past Surgical History, Family History, and Social History were reviewed in [employer] record.  ROS  The following are not active complaints unless bolded Hoarseness, sore throat, dysphagia, dental problems, itching, sneezing,  nasal congestion or discharge of excess mucus or purulent secretions, ear ache,   fever, chills, sweats, unintended wt loss or wt gain, classically pleuritic or exertional cp,  orthopnea pnd or arm/hand swelling  or leg swelling, presyncope, palpitations, abdominal pain, anorexia, nausea, vomiting, diarrhea  or change in bowel habits or change in bladder habits, change in stools or change in urine, dysuria, hematuria,  rash, arthralgias, visual complaints, headache, numbness, weakness or ataxia or problems with  walking or coordination,  change in mood or  memory.            Outpatient Medications Prior to Visit  Medication Sig Dispense Refill   albuterol  (VENTOLIN  HFA) 108 (90 Base) MCG/ACT inhaler Inhale 2 puffs into the lungs every 6 (six) hours as needed for wheezing or shortness of breath. 6.7 g 2   aspirin 81 MG tablet Take 81 mg by mouth daily.     Calcium  Carbonate-Vitamin D  (CALCIUM  600+D PO) Take 1 tablet by mouth daily. Taking calcium  + D3     cholecalciferol (VITAMIN D ) 1000 UNITS tablet Take 5,000 Units by mouth daily.     enzalutamide  (XTANDI ) 40 MG tablet TAKE 4 TABLETS (160 MG TOTAL) BY MOUTH DAILY. 120 tablet 3   lidocaine  4 % Place 1 patch onto the skin daily. Remove & Discard patch within 12 hours or as directed by MD. Apply to sternum. 30 patch 0   methocarbamol  (ROBAXIN ) 500 MG tablet Take 1 tablet (500 mg total) by mouth 3 (three) times daily. 90 tablet 0   Olmesartan -amLODIPine -HCTZ 40-10-25 MG TABS TAKE 1 TABLET BY MOUTH EVERY DAY 90 tablet 1   Omega-3 Fatty Acids (FISH OIL PO) Take 1 tablet by mouth daily.     oxyCODONE  (OXY IR/ROXICODONE ) 5 MG immediate release tablet Take 1 tablet (5 mg total) by mouth every 6 (six) hours as needed for moderate pain (pain score 4-6) or severe pain (pain score 7-10). 28 tablet 0   pioglitazone -metformin  (ACTOPLUS MET ) 15-850 MG tablet TAKE 1 TABLET BY MOUTH  TWICE A DAY 180 tablet 1   rosuvastatin  (CRESTOR ) 10 MG tablet TAKE 1 TABLET BY MOUTH EVERY DAY 90 tablet 1   Semaglutide ,0.25 or 0.5MG /DOS, (OZEMPIC , 0.25 OR 0.5 MG/DOSE,) 2 MG/3ML SOPN INJECT 0.25 MG WEEKLY FOR 4 WEEKS, THEN INCREASE TO 0.5 MG WEEKLY 3 mL 2   senna-docusate (SENOKOT-S) 8.6-50 MG tablet Take 2 tablets by mouth at bedtime. For constipation 60 tablet 0   spironolactone  (ALDACTONE ) 25 MG tablet TAKE 1 TABLET (25 MG TOTAL) BY MOUTH DAILY. 90 tablet 1   Tiotropium Bromide -Olodaterol 2.5-2.5 MCG/ACT AERS Inhale 2 puffs into the lungs daily. 4 g 4   No facility-administered  medications prior to visit.    Past Medical History:  Diagnosis Date   Cancer of prostate (HCC)    Diabetes mellitus without complication (HCC)    Hyperlipidemia    Hypertension       Objective:     BP (!) 163/90   Pulse 71   Ht 5' 8 (1.727 m)   Wt 168 lb 6.4 oz (76.4 kg)   SpO2 100% Comment: ra  BMI 25.61 kg/m   SpO2: 100 % (ra) somber soft spoken amb bm nad    HEENT : Oropharynx  clear   Nasal turbinates  nl    NECK :  without  apparent JVD/ palpable Nodes/TM    LUNGS: no acc muscle use,  Min barrel  contour chest wall with bilateral  slightly decreased bs s audible wheeze and  without cough on insp or exp maneuvers and min  Hyperresonant  to  percussion bilaterally    CV:  RRR  no s3 or murmur or increase in P2, and no edema   ABD:  soft and nontender    MS:  Nl gait/ ext warm without deformities Or obvious joint restrictions  calf tenderness, cyanosis or clubbing     SKIN: warm and dry without lesions    NEURO:  alert, approp, nl sensorium with  no motor or cerebellar deficits apparent.           Assessment   Assessment & Plan Former cigarette smoker Congratulated on stopping smoking and maintaining of cigs, the most important aspect to his resp prognosis and rx need now and in future.   Low-dose CT lung cancer screening is recommended for patients who are 43-60 years of age with a 20+ pack-year history of smoking and who are currently smoking or quit <=15 years ago. No coughing up blood  No unintentional weight loss of > 15 pounds in the last 6 months - pt is eligible for scanning yearly until  age 58 > referred    COPD GOLD ? / group A vs B Quit smoking 07974 with cardiac arrest -  10/25/2023 try taper off stiolto and restart if losing ground   Comment:  he has minimal finds of copd and is not limited by doe or having aecopd so if does not fine off stiolto he can f/u prn   - if not or if significant lung dz on LDSCT can return for f/u PFTs    Discussed in detail all the  indications, usual  risks and alternatives  relative to the benefits with patient who agrees to proceed with Rx as outlined.        Each maintenance medication was reviewed in detail including emphasizing most importantly the difference between maintenance and prns and under what circumstances the prns are to be triggered using an action plan format where appropriate.  Total  time for H and P, chart review, counseling, reviewing smi/respimat device(s) and generating customized AVS unique to this office visit / same day charting = 42 min with pt new to me         AVS  Patient Instructions  Try reduce the stiolto to 1 puff daily 1st thing in am x 1 week and then stop  - if losing ground with your breathing when you walk or work in the garden  My office will be contacting you by phone for referral to for PFTs  and for Lung scan  ([phone]) - if you don't hear back from my office within one week please call us  back or notify us  thru MyChart and we'll address it right away.   Pulmonary follow is as needed      Ozell America, MD 10/25/2023

## 2023-10-25 NOTE — Assessment & Plan Note (Addendum)
 Quit smoking 07974 with cardiac arrest -  10/25/2023 try taper off stiolto and restart if losing ground   Comment:  he has minimal finds of copd and is not limited by doe or having aecopd so if does not fine off stiolto he can f/u prn   - if not or if significant lung dz on LDSCT can return for f/u PFTs   Discussed in detail all the  indications, usual  risks and alternatives  relative to the benefits with patient who agrees to proceed with Rx as outlined.        Each maintenance medication was reviewed in detail including emphasizing most importantly the difference between maintenance and prns and under what circumstances the prns are to be triggered using an action plan format where appropriate.  Total time for H and P, chart review, counseling, reviewing smi/respimat device(s) and generating customized AVS unique to this office visit / same day charting = 42 min with pt new to me

## 2023-10-25 NOTE — Patient Instructions (Signed)
 Try reduce the stiolto to 1 puff daily 1st thing in am x 1 week and then stop  - if losing ground with your breathing when you walk or work in the garden  My office will be contacting you by phone for referral to for PFTs  and for Lung scan  ([phone]) - if you don't hear back from my office within one week please call us  back or notify us  thru MyChart and we'll address it right away.   Pulmonary follow is as needed

## 2023-10-25 NOTE — Assessment & Plan Note (Addendum)
 Congratulated on stopping smoking and maintaining of cigs, the most important aspect to his resp prognosis and rx need now and in future.   Low-dose CT lung cancer screening is recommended for patients who are 72-74 years of age with a 20+ pack-year history of smoking and who are currently smoking or quit <=15 years ago. No coughing up blood  No unintentional weight loss of > 15 pounds in the last 6 months - pt is eligible for scanning yearly until  age 44 > referred

## 2023-10-30 ENCOUNTER — Other Ambulatory Visit: Payer: Self-pay

## 2023-10-30 NOTE — Progress Notes (Signed)
 Specialty Pharmacy Ongoing Clinical Assessment Note  Kyle George is a 73 y.o. male who is being followed by the specialty pharmacy service for RxSp Oncology   Patient's specialty medication(s) reviewed today: Enzalutamide  (XTANDI )   Missed doses in the last 4 weeks: 0   Patient/Caregiver did not have any additional questions or concerns.   Therapeutic benefit summary: Patient is achieving benefit   Adverse events/side effects summary: No adverse events/side effects   Patient's therapy is appropriate to: Continue    Goals Addressed             This Visit's Progress    Slow Disease Progression   On track    Patient is on track. Patient will maintain adherence. PSA  <0.02 ng/ml as of 09/17/23 labs.         Follow up: 6 months  Silvano LOISE Dolly Specialty Pharmacist

## 2023-11-01 ENCOUNTER — Ambulatory Visit

## 2023-11-01 DIAGNOSIS — I4901 Ventricular fibrillation: Secondary | ICD-10-CM | POA: Diagnosis not present

## 2023-11-02 LAB — CUP PACEART REMOTE DEVICE CHECK
Battery Remaining Longevity: 150 mo
Battery Voltage: 3.21 V
Brady Statistic AP VP Percent: 9.66 %
Brady Statistic AP VS Percent: 0.01 %
Brady Statistic AS VP Percent: 83.37 %
Brady Statistic AS VS Percent: 6.96 %
Brady Statistic RA Percent Paced: 12.32 %
Brady Statistic RV Percent Paced: 93.03 %
Date Time Interrogation Session: 20251023032401
Implantable Lead Connection Status: 753985
Implantable Lead Connection Status: 753985
Implantable Lead Implant Date: 20250910
Implantable Lead Implant Date: 20250910
Implantable Lead Location: 753859
Implantable Lead Location: 753860
Implantable Lead Model: 3830
Implantable Lead Model: 5076
Implantable Pulse Generator Implant Date: 20250910
Lead Channel Impedance Value: 304 Ohm
Lead Channel Impedance Value: 323 Ohm
Lead Channel Impedance Value: 380 Ohm
Lead Channel Impedance Value: 437 Ohm
Lead Channel Pacing Threshold Amplitude: 0.5 V
Lead Channel Pacing Threshold Amplitude: 0.875 V
Lead Channel Pacing Threshold Pulse Width: 0.4 ms
Lead Channel Pacing Threshold Pulse Width: 0.4 ms
Lead Channel Sensing Intrinsic Amplitude: 4.625 mV
Lead Channel Sensing Intrinsic Amplitude: 4.625 mV
Lead Channel Sensing Intrinsic Amplitude: 4.625 mV
Lead Channel Sensing Intrinsic Amplitude: 4.625 mV
Lead Channel Setting Pacing Amplitude: 2 V
Lead Channel Setting Pacing Amplitude: 2 V
Lead Channel Setting Pacing Pulse Width: 0.4 ms
Lead Channel Setting Sensing Sensitivity: 1.2 mV
Zone Setting Status: 755011

## 2023-11-05 NOTE — Progress Notes (Signed)
 Remote PPM Transmission

## 2023-11-06 ENCOUNTER — Ambulatory Visit: Payer: Self-pay | Admitting: Cardiology

## 2023-11-08 ENCOUNTER — Other Ambulatory Visit (HOSPITAL_COMMUNITY): Payer: Self-pay

## 2023-11-09 ENCOUNTER — Encounter

## 2023-11-13 ENCOUNTER — Other Ambulatory Visit (HOSPITAL_COMMUNITY): Payer: Self-pay

## 2023-11-13 ENCOUNTER — Other Ambulatory Visit: Payer: Self-pay

## 2023-11-13 NOTE — Progress Notes (Signed)
 Specialty Pharmacy Refill Coordination Note  Kyle George is a 73 y.o. male contacted today regarding refills of specialty medication(s) Enzalutamide  (XTANDI )   Patient requested Delivery   Delivery date: 11/20/23   Verified address: [address]  [place]   Medication will be filled on: 11/19/23

## 2023-11-19 ENCOUNTER — Other Ambulatory Visit: Payer: Self-pay | Admitting: Nurse Practitioner

## 2023-11-19 ENCOUNTER — Other Ambulatory Visit: Payer: Self-pay

## 2023-11-19 ENCOUNTER — Inpatient Hospital Stay

## 2023-11-19 DIAGNOSIS — E782 Mixed hyperlipidemia: Secondary | ICD-10-CM

## 2023-11-21 ENCOUNTER — Inpatient Hospital Stay: Attending: Nurse Practitioner

## 2023-11-21 ENCOUNTER — Inpatient Hospital Stay

## 2023-11-21 VITALS — BP 155/92 | HR 71 | Temp 97.5°F | Resp 18

## 2023-11-21 DIAGNOSIS — C61 Malignant neoplasm of prostate: Secondary | ICD-10-CM

## 2023-11-21 LAB — COMPREHENSIVE METABOLIC PANEL WITH GFR
ALT: 7 U/L (ref 0–44)
AST: 18 U/L (ref 15–41)
Albumin: 4.3 g/dL (ref 3.5–5.0)
Alkaline Phosphatase: 58 U/L (ref 38–126)
Anion gap: 13 (ref 5–15)
BUN: 26 mg/dL — ABNORMAL HIGH (ref 8–23)
CO2: 26 mmol/L (ref 22–32)
Calcium: 9.4 mg/dL (ref 8.9–10.3)
Chloride: 102 mmol/L (ref 98–111)
Creatinine, Ser: 0.93 mg/dL (ref 0.61–1.24)
GFR, Estimated: >60 mL/min (ref >60)
Glucose, Bld: 79 mg/dL (ref 70–99)
Potassium: 3.7 mmol/L (ref 3.5–5.1)
Sodium: 141 mmol/L (ref 135–145)
Total Bilirubin: 0.3 mg/dL (ref 0.0–1.2)
Total Protein: 7.8 g/dL (ref 6.5–8.1)

## 2023-11-21 MED ORDER — DENOSUMAB 120 MG/1.7ML ~~LOC~~ SOLN
120.0000 mg | Freq: Once | SUBCUTANEOUS | Status: AC
  Administered 2023-11-21: 120 mg via SUBCUTANEOUS
  Filled 2023-11-21: qty 1.7

## 2023-11-21 NOTE — Patient Instructions (Signed)
 [facility] [place] - A DEPT OF [facility]  Discharge Instructions: Thank you for choosing [facility] to provide your oncology and hematology care.  If you have a lab appointment with the Cancer Center - please note that after April 8th, 2024, all labs will be drawn in the cancer center.  You do not have to check in or register with the main entrance as you have in the past but will complete your check-in in the cancer center.  Wear comfortable clothing and clothing appropriate for easy access to any Portacath or PICC line.   We strive to give you quality time with your provider. You may need to reschedule your appointment if you arrive late (15 or more minutes).  Arriving late affects you and other patients whose appointments are after yours.  Also, if you miss three or more appointments without notifying the office, you may be dismissed from the clinic at the provider's discretion.      For prescription refill requests, have your pharmacy contact our office and allow 72 hours for refills to be completed.    Today you received the following xgeva , return as scheduled.   To help prevent nausea and vomiting after your treatment, we encourage you to take your nausea medication as directed.  BELOW ARE SYMPTOMS THAT SHOULD BE REPORTED IMMEDIATELY: *FEVER GREATER THAN 100.4 F (38 C) OR HIGHER *CHILLS OR SWEATING *NAUSEA AND VOMITING THAT IS NOT CONTROLLED WITH YOUR NAUSEA MEDICATION *UNUSUAL SHORTNESS OF BREATH *UNUSUAL BRUISING OR BLEEDING *URINARY PROBLEMS (pain or burning when urinating, or frequent urination) *BOWEL PROBLEMS (unusual diarrhea, constipation, pain near the anus) TENDERNESS IN MOUTH AND THROAT WITH OR WITHOUT PRESENCE OF ULCERS (sore throat, sores in mouth, or a toothache) UNUSUAL RASH, SWELLING OR PAIN  UNUSUAL VAGINAL DISCHARGE OR ITCHING   Items with * indicate a potential emergency and should be followed up as soon as possible or go  to the Emergency Department if any problems should occur.  Please show the CHEMOTHERAPY ALERT CARD or IMMUNOTHERAPY ALERT CARD at check-in to the Emergency Department and triage nurse.  Should you have questions after your visit or need to cancel or reschedule your appointment, please contact [facility] [place] - A DEPT OF [facility] [phone]  and follow the prompts.  Office hours are 8:00 a.m. to 4:30 p.m. Monday - Friday. Please note that voicemails left after 4:00 p.m. may not be returned until the following business day.  We are closed weekends and major holidays. You have access to a nurse at all times for urgent questions. Please call the main number to the clinic [phone] and follow the prompts.  For any non-urgent questions, you may also contact your provider using MyChart. We now offer e-Visits for anyone 54 and older to request care online for non-urgent symptoms. For details visit [email or web address].   Also download the MyChart app! Go to the app store, search MyChart, open the app, select [place], and log in with your MyChart username and password.

## 2023-11-21 NOTE — Progress Notes (Signed)
Patient taking calcium as directed. Denied tooth, jaw, and leg pain. No recent or upcoming dental visits. Labs reviewed. Patient tolerated injection with no complaints voiced. See MAR for details. Patient stable during and after injection. Site clean and dry with no bruising or swelling noted. Band aid applied. Vss with discharge and left in satisfactory condition with no s/s of distress.  

## 2023-11-28 ENCOUNTER — Encounter: Payer: Self-pay | Admitting: Nurse Practitioner

## 2023-12-11 ENCOUNTER — Ambulatory Visit: Admitting: Nurse Practitioner

## 2023-12-12 ENCOUNTER — Other Ambulatory Visit: Payer: Self-pay

## 2023-12-14 ENCOUNTER — Other Ambulatory Visit: Payer: Self-pay

## 2023-12-14 NOTE — Progress Notes (Signed)
 Specialty Pharmacy Refill Coordination Note  Kyle George is a 73 y.o. male contacted today regarding refills of specialty medication(s) Enzalutamide  (XTANDI )   Patient requested Delivery   Delivery date: 12/31/23   Verified address: [address]  [place]   Medication will be filled on: 12/28/23

## 2023-12-18 NOTE — Progress Notes (Signed)
 Kyle George                                          MRN: [id number]   12/18/2023   The [facility] Quality Team Specialist reviewed this patient medical record for the purposes of chart review for care gap closure. The following were reviewed: chart review for care gap closure-controlling blood pressure.    VBCI Quality Team

## 2023-12-19 ENCOUNTER — Inpatient Hospital Stay

## 2023-12-19 ENCOUNTER — Inpatient Hospital Stay: Attending: Nurse Practitioner

## 2023-12-19 VITALS — BP 161/86 | HR 67 | Temp 97.7°F | Resp 18

## 2023-12-19 DIAGNOSIS — Z79899 Other long term (current) drug therapy: Secondary | ICD-10-CM | POA: Diagnosis not present

## 2023-12-19 DIAGNOSIS — C61 Malignant neoplasm of prostate: Secondary | ICD-10-CM

## 2023-12-19 DIAGNOSIS — C7951 Secondary malignant neoplasm of bone: Secondary | ICD-10-CM | POA: Insufficient documentation

## 2023-12-19 LAB — COMPREHENSIVE METABOLIC PANEL WITH GFR
ALT: 5 U/L (ref 0–44)
AST: 18 U/L (ref 15–41)
Albumin: 4.3 g/dL (ref 3.5–5.0)
Alkaline Phosphatase: 58 U/L (ref 38–126)
Anion gap: 15 (ref 5–15)
BUN: 26 mg/dL — ABNORMAL HIGH (ref 8–23)
CO2: 21 mmol/L — ABNORMAL LOW (ref 22–32)
Calcium: 9.5 mg/dL (ref 8.9–10.3)
Chloride: 104 mmol/L (ref 98–111)
Creatinine, Ser: 0.94 mg/dL (ref 0.61–1.24)
GFR, Estimated: >60 mL/min (ref >60)
Glucose, Bld: 81 mg/dL (ref 70–99)
Potassium: 4.4 mmol/L (ref 3.5–5.1)
Sodium: 139 mmol/L (ref 135–145)
Total Bilirubin: 0.3 mg/dL (ref 0.0–1.2)
Total Protein: 8.2 g/dL — ABNORMAL HIGH (ref 6.5–8.1)

## 2023-12-19 MED ORDER — DENOSUMAB 120 MG/1.7ML ~~LOC~~ SOLN
120.0000 mg | Freq: Once | SUBCUTANEOUS | Status: AC
  Administered 2023-12-19: 120 mg via SUBCUTANEOUS
  Filled 2023-12-19: qty 1.7

## 2023-12-19 NOTE — Patient Instructions (Signed)
 [facility] [place] - A DEPT OF [facility]  Discharge Instructions: Thank you for choosing [facility] to provide your oncology and hematology care.  If you have a lab appointment with the Cancer Center - please note that after April 8th, 2024, all labs will be drawn in the cancer center.  You do not have to check in or register with the main entrance as you have in the past but will complete your check-in in the cancer center.  Wear comfortable clothing and clothing appropriate for easy access to any Portacath or PICC line.   We strive to give you quality time with your provider. You may need to reschedule your appointment if you arrive late (15 or more minutes).  Arriving late affects you and other patients whose appointments are after yours.  Also, if you miss three or more appointments without notifying the office, you may be dismissed from the clinic at the provider's discretion.      For prescription refill requests, have your pharmacy contact our office and allow 72 hours for refills to be completed.    Today you received the following  Xgeva, return as scheduled.   To help prevent nausea and vomiting after your treatment, we encourage you to take your nausea medication as directed.  BELOW ARE SYMPTOMS THAT SHOULD BE REPORTED IMMEDIATELY: *FEVER GREATER THAN 100.4 F (38 C) OR HIGHER *CHILLS OR SWEATING *NAUSEA AND VOMITING THAT IS NOT CONTROLLED WITH YOUR NAUSEA MEDICATION *UNUSUAL SHORTNESS OF BREATH *UNUSUAL BRUISING OR BLEEDING *URINARY PROBLEMS (pain or burning when urinating, or frequent urination) *BOWEL PROBLEMS (unusual diarrhea, constipation, pain near the anus) TENDERNESS IN MOUTH AND THROAT WITH OR WITHOUT PRESENCE OF ULCERS (sore throat, sores in mouth, or a toothache) UNUSUAL RASH, SWELLING OR PAIN  UNUSUAL VAGINAL DISCHARGE OR ITCHING   Items with * indicate a potential emergency and should be followed up as soon as possible or  go to the Emergency Department if any problems should occur.  Please show the CHEMOTHERAPY ALERT CARD or IMMUNOTHERAPY ALERT CARD at check-in to the Emergency Department and triage nurse.  Should you have questions after your visit or need to cancel or reschedule your appointment, please contact [facility] [place] - A DEPT OF [facility] [phone]  and follow the prompts.  Office hours are 8:00 a.m. to 4:30 p.m. Monday - Friday. Please note that voicemails left after 4:00 p.m. may not be returned until the following business day.  We are closed weekends and major holidays. You have access to a nurse at all times for urgent questions. Please call the main number to the clinic [phone] and follow the prompts.  For any non-urgent questions, you may also contact your provider using MyChart. We now offer e-Visits for anyone 56 and older to request care online for non-urgent symptoms. For details visit [email or web address].   Also download the MyChart app! Go to the app store, search "MyChart", open the app, select [place], and log in with your MyChart username and password.

## 2023-12-19 NOTE — Progress Notes (Signed)
Patient taking calcium as directed. Denied tooth, jaw, and leg pain. No recent or upcoming dental visits. Labs reviewed. Patient tolerated injection with no complaints voiced. See MAR for details. Patient stable during and after injection. Site clean and dry with no bruising or swelling noted. Band aid applied. Vss with discharge and left in satisfactory condition with no s/s of distress.  

## 2023-12-26 ENCOUNTER — Encounter: Payer: Self-pay | Admitting: Cardiology

## 2023-12-26 ENCOUNTER — Ambulatory Visit: Attending: Cardiovascular Disease | Admitting: Cardiology

## 2023-12-26 VITALS — BP 130/72 | HR 74 | Ht 68.0 in | Wt 171.0 lb

## 2023-12-26 DIAGNOSIS — I4901 Ventricular fibrillation: Secondary | ICD-10-CM | POA: Diagnosis not present

## 2023-12-26 DIAGNOSIS — I442 Atrioventricular block, complete: Secondary | ICD-10-CM

## 2023-12-26 DIAGNOSIS — I469 Cardiac arrest, cause unspecified: Secondary | ICD-10-CM

## 2023-12-26 DIAGNOSIS — I10 Essential (primary) hypertension: Secondary | ICD-10-CM

## 2023-12-26 NOTE — Progress Notes (Signed)
°  Electrophysiology Office Note:   Date:  12/26/2023  ID:  IVOR KISHI, DOB [birth year], MRN [id number]  Primary Cardiologist: None Primary Heart Failure: None Electrophysiologist: Shavette Shoaff Gladis Norton, MD      History of Present Illness:   JUANANGEL SODERHOLM is a 73 y.o. male with h/o prostate cancer, hypertension, complete heart block, VF cardiac arrest seen today for routine electrophysiology followup.   Discussed the use of AI scribe software for clinical note transcription with the patient, who gave verbal consent to proceed.  History of Present Illness He is a 73 year old male with a pacemaker who presents for a follow-up visit.  He has been feeling well since the pacemaker implantation, noting an improvement in his overall condition.  Since his device was implanted, he has had no issues.  He is able to do his daily activities.  He has no acute complaints at this time.  he denies chest pain, palpitations, dyspnea, PND, orthopnea, nausea, vomiting, dizziness, syncope, edema, weight gain, or early satiety.   Review of systems complete and found to be negative unless listed in HPI.      EP Information / Studies Reviewed:    EKG is ordered today. Personal review as below.  EKG Interpretation Date/Time:  Wednesday December 26 2023 16:04:27 EST Ventricular Rate:  74 PR Interval:  170 QRS Duration:  166 QT Interval:  486 QTC Calculation: 539 R Axis:   13  Text Interpretation: Atrial-sensed ventricular-paced rhythm with occasional Premature ventricular complexes When compared with ECG of 20-Sep-2023 07:53, No significant change since last tracing Confirmed by Jakaden Ouzts ([id number]) on 12/26/2023 4:26:29 PM   PPM Interrogation-  reviewed in detail today,  See PACEART report.  Device History: Medtronic Dual Chamber PPM implanted 09/19/2023 for [facility]  Risk Assessment/Calculations:           Physical Exam:   VS:  BP 130/72 (BP Location: Right Arm, Patient Position: Sitting, Cuff  Size: Normal)   Pulse 74   Ht 5' 8 (1.727 m)   Wt 171 lb (77.6 kg)   SpO2 97%   BMI 26.00 kg/m    Wt Readings from Last 3 Encounters:  12/26/23 171 lb (77.6 kg)  10/25/23 168 lb 6.4 oz (76.4 kg)  10/04/23 169 lb 12.8 oz (77 kg)     GEN: Well nourished, well developed in no acute distress NECK: No JVD; No carotid bruits CARDIAC: Regular rate and rhythm, no murmurs, rubs, gallops RESPIRATORY:  Clear to auscultation without rales, wheezing or rhonchi  ABDOMEN: Soft, non-tender, non-distended EXTREMITIES:  No edema; No deformity   ASSESSMENT AND PLAN:    CHB s/p Medtronic PPM  Normal PPM function See Pace Art report No changes today  2.  VF arrest: Had VF arrest with R-on-T phenomenon when he was bradycardic  3.  Hypertension: Well-controlled   Disposition:   Follow up with EP Team in 12 months  Signed, Romy Mcgue Gladis Norton, MD

## 2023-12-28 ENCOUNTER — Other Ambulatory Visit: Payer: Self-pay

## 2024-01-07 ENCOUNTER — Encounter: Payer: Self-pay | Admitting: *Deleted

## 2024-01-09 LAB — CUP PACEART INCLINIC DEVICE CHECK
Date Time Interrogation Session: 20251217211618
Implantable Lead Connection Status: 753985
Implantable Lead Connection Status: 753985
Implantable Lead Implant Date: 20250910
Implantable Lead Implant Date: 20250910
Implantable Lead Location: 753859
Implantable Lead Location: 753860
Implantable Lead Model: 3830
Implantable Lead Model: 5076
Implantable Pulse Generator Implant Date: 20250910

## 2024-01-11 ENCOUNTER — Ambulatory Visit: Payer: Self-pay | Admitting: Cardiology

## 2024-01-18 ENCOUNTER — Inpatient Hospital Stay: Attending: Nurse Practitioner | Admitting: Oncology

## 2024-01-18 ENCOUNTER — Inpatient Hospital Stay

## 2024-01-18 ENCOUNTER — Other Ambulatory Visit: Payer: Self-pay

## 2024-01-18 ENCOUNTER — Other Ambulatory Visit: Payer: Self-pay | Admitting: Physician Assistant

## 2024-01-18 VITALS — BP 154/79 | HR 65 | Temp 97.5°F | Resp 18

## 2024-01-18 DIAGNOSIS — Z79818 Long term (current) use of other agents affecting estrogen receptors and estrogen levels: Secondary | ICD-10-CM | POA: Insufficient documentation

## 2024-01-18 DIAGNOSIS — C7951 Secondary malignant neoplasm of bone: Secondary | ICD-10-CM | POA: Insufficient documentation

## 2024-01-18 DIAGNOSIS — Z191 Hormone sensitive malignancy status: Secondary | ICD-10-CM | POA: Insufficient documentation

## 2024-01-18 DIAGNOSIS — R001 Bradycardia, unspecified: Secondary | ICD-10-CM | POA: Diagnosis not present

## 2024-01-18 DIAGNOSIS — Z7984 Long term (current) use of oral hypoglycemic drugs: Secondary | ICD-10-CM | POA: Diagnosis not present

## 2024-01-18 DIAGNOSIS — C61 Malignant neoplasm of prostate: Secondary | ICD-10-CM

## 2024-01-18 DIAGNOSIS — Z79899 Other long term (current) drug therapy: Secondary | ICD-10-CM | POA: Diagnosis not present

## 2024-01-18 DIAGNOSIS — Z7982 Long term (current) use of aspirin: Secondary | ICD-10-CM | POA: Diagnosis not present

## 2024-01-18 DIAGNOSIS — C778 Secondary and unspecified malignant neoplasm of lymph nodes of multiple regions: Secondary | ICD-10-CM | POA: Insufficient documentation

## 2024-01-18 DIAGNOSIS — Z79890 Hormone replacement therapy (postmenopausal): Secondary | ICD-10-CM | POA: Insufficient documentation

## 2024-01-18 DIAGNOSIS — D7589 Other specified diseases of blood and blood-forming organs: Secondary | ICD-10-CM

## 2024-01-18 LAB — COMPREHENSIVE METABOLIC PANEL WITH GFR
ALT: 8 U/L (ref 0–44)
AST: 16 U/L (ref 15–41)
Albumin: 4.4 g/dL (ref 3.5–5.0)
Alkaline Phosphatase: 57 U/L (ref 38–126)
Anion gap: 15 (ref 5–15)
BUN: 25 mg/dL — ABNORMAL HIGH (ref 8–23)
CO2: 25 mmol/L (ref 22–32)
Calcium: 9.3 mg/dL (ref 8.9–10.3)
Chloride: 102 mmol/L (ref 98–111)
Creatinine, Ser: 1.27 mg/dL — ABNORMAL HIGH (ref 0.61–1.24)
GFR, Estimated: 59 mL/min — ABNORMAL LOW
Glucose, Bld: 83 mg/dL (ref 70–99)
Potassium: 3.9 mmol/L (ref 3.5–5.1)
Sodium: 142 mmol/L (ref 135–145)
Total Bilirubin: 0.3 mg/dL (ref 0.0–1.2)
Total Protein: 7.9 g/dL (ref 6.5–8.1)

## 2024-01-18 LAB — PSA: Prostatic Specific Antigen: <0.02 ng/mL (ref 0.00–4.00)

## 2024-01-18 LAB — CBC WITH DIFFERENTIAL/PLATELET
Abs Immature Granulocytes: 0.01 K/uL (ref 0.00–0.07)
Basophils Absolute: 0 K/uL (ref 0.0–0.1)
Basophils Relative: 1 %
Eosinophils Absolute: 0.3 K/uL (ref 0.0–0.5)
Eosinophils Relative: 6 %
HCT: 41.2 % (ref 39.0–52.0)
Hemoglobin: 13.3 g/dL (ref 13.0–17.0)
Immature Granulocytes: 0 %
Lymphocytes Relative: 29 %
Lymphs Abs: 1.7 K/uL (ref 0.7–4.0)
MCH: 33.3 pg (ref 26.0–34.0)
MCHC: 32.3 g/dL (ref 30.0–36.0)
MCV: 103.3 fL — ABNORMAL HIGH (ref 80.0–100.0)
Monocytes Absolute: 0.4 K/uL (ref 0.1–1.0)
Monocytes Relative: 6 %
Neutro Abs: 3.4 K/uL (ref 1.7–7.7)
Neutrophils Relative %: 58 %
Platelets: 190 K/uL (ref 150–400)
RBC: 3.99 MIL/uL — ABNORMAL LOW (ref 4.22–5.81)
RDW: 14.8 % (ref 11.5–15.5)
WBC: 5.8 K/uL (ref 4.0–10.5)
nRBC: 0 % (ref 0.0–0.2)

## 2024-01-18 LAB — FOLATE: Folate: 9.8 ng/mL

## 2024-01-18 LAB — VITAMIN B12: Vitamin B-12: 205 pg/mL (ref 180–914)

## 2024-01-18 MED ORDER — DENOSUMAB 120 MG/1.7ML ~~LOC~~ SOLN
120.0000 mg | Freq: Once | SUBCUTANEOUS | Status: AC
  Administered 2024-01-18: 120 mg via SUBCUTANEOUS
  Filled 2024-01-18: qty 1.7

## 2024-01-18 MED ORDER — LEUPROLIDE ACETATE (4 MONTH) 30 MG ~~LOC~~ KIT
30.0000 mg | PACK | Freq: Once | SUBCUTANEOUS | Status: AC
  Administered 2024-01-18: 30 mg via SUBCUTANEOUS
  Filled 2024-01-18: qty 30

## 2024-01-18 NOTE — Progress Notes (Signed)
 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 male was transferred to my care after his prior physician has left.   ASSESSMENT & PLAN:   Assessment & Plan: Kyle George  is a 74 y.o. male with metastatic castrate resistant prostate cancer   Assessment and Plan Assessment & Plan Metastatic castrate resistant prostate cancer Extensive oncology history below Patient progressed on abiraterone  and prednisone  and is currently on Xtandi   -Patient tolerating enzalutamide  well with no side effects.  Continue enzalutamide  160 mg daily - Continue Eligard  every 4 months - Continue denosumab  monthly - Labs reviewed today: CMP pending at this time, CBC: Mild microcytosis but normal hemoglobin, last PSA from 09/2023 less than 0.02  Return to clinic in 3 months for follow-up  Metastatic bone disease Denosumab  started on 10/12/2022 Calcium  pending today.  - Will proceed with denosumab  if calcium  is greater than 8.  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  He knows to contact our office if he develops concerns prior to his next appointment.  30 minutes of total time was spent for this patient encounter, including preparation,review of records,  face-to-face counseling with the patient and coordination of care, physical exam, and documentation of the encounter.    Kyle George,acting as a neurosurgeon for Mickiel Dry, MD.,have documented all relevant documentation on the behalf of Mickiel Dry, MD,as directed by  Mickiel Dry, MD while  in the presence of Mickiel Dry, MD.  I, Mickiel Dry MD, have reviewed the above documentation for accuracy and completeness, and I agree with the above.    Mickiel Dry, MD   CANCER CENTER [facility] [place] - A DEPT OF [facility] [address] [place] [address] Dept: [phone] Dept Fax: [phone]   No orders of the defined types were placed in this encounter.    ONCOLOGY HISTORY:   I have reviewed his chart and materials related to his cancer extensively and collaborated history with the patient. 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 Testosterone  30.  -09/12/2011: Prostate biopsy.   Pathology: Adenocarcinoma of the prostate in 11/12 cores, Gleason score 4+4=8 in 10 cores with Gleason score of 4+5=9 in 1 core. Adenocarcinoma involves up to 90% of one core.  -09/25/2011: NM Bone Scan: No evidence suggestive of metastatic disease.  -11/16/2011: Right iliac lymph node biopsy.  Pathology: Adenocarcinoma consistent with metastatic prostate adenocarcinoma, Gleason's score 4+4=8.  -11/07/2013: NM Bone Scan: Multifocal metastatic disease consistent with the patient's given clinical history.  -11/25/2011: PSA 500.3 -12/2013 - 08/2014: Firmagon started, transitioned to Lupron  -03/13/2014: PSA 78.69 -09/05/2014: PSA 6.39 -09/2014 - 03/2016: Patient lost to follow-up, not receiving lupron  injections during this time -04/04/2016: PSA 1,258 -05/10/2016: NM Bone Scan: Changes consistent with multifocal metastatic disease from the patient's known prostate carcinoma. Some areas have increased in activity of the ribcage predominately has decreased in activity from the prior study. -06/09/2016 - current: Lupron /Eligard  30 mg every 4  months -07/31/2016: PSA 131.6 -08/02/2016 - 02/13/2020: Abiraterone  and prednisone .  Discontinued for progression -09/28/2016: PSA 1.6 -2018 - 02/2019: PSA remained mostly within normal limits -02/18/2019: PSA 8.8 -06/18/2019: PSA 8.5 -10/17/2019: PSA 20.3 -02/13/2020: PSA 27.3 -03/01/2020 - current: Xtandi  160 mg daily -03/24/2020: PSA 0.8 -03/24/2020: NM Bone Scan:  Increased activity compatible with known progressive malignancy in the bony pelvis, especially along the right ischium and adjacent to the sacroiliac joints. Some of the lucent lesions seen on CT such as the lesion anteriorly in the right second rib and the lesion in left humeral head do not demonstrate accentuated uptake on today's bone scan. Also some of the chronic lesions such as the primarily lytic lesion involving the left L3 posterior elements are poorly seen. -03/24/2020: CT CAP : Mixed appearance, with mildly improved retroperitoneal and left pelvic sidewall adenopathy, but with new and substantially increased sclerotic osseous metastatic lesions compatible with progressive malignancy. Upper normal sized partially calcified subcarinal lymph nodes may well represent metastatic disease given their similarity in appearance to the calcified abdominal and pelvic lymph nodes. The subcarinal region has not been previously characterized (no prior chest CT). Stable localized wall thickening in the terminal ileum, polyp or a mass cannot be excluded, although the appearance is not substantially progressive compared to 06/05/2018. -04/29/2020: Guardant 360: MSI- High not detected. TMB not evaluable. -2022 - current: PSA remains within normal limits -10/12/2022- current: Xgeva  monthly  Current Treatment:  Enzalutamide  120mg  daily and Xgeva  monthly  INTERVAL HISTORY:   Discussed the use of AI scribe software for clinical note transcription with the patient, who gave verbal consent to proceed.  History of Present Illness Kyle George is a 74 year old male with prostate carcinoma undergoing active treatment who  presents for routine hematology/oncology follow-up.  He is receiving monthly injections for malignancy and follows up in hematology/oncology every three months. He reports no current symptoms and specifically denies fatigue, nausea, vomiting, or diarrhea. He is tolerating therapy well without significant adverse effects.  In September 2025, he experienced symptomatic bradycardia requiring pacemaker placement. He has not yet established care with a new primary care provider and has an appointment scheduled for May 2026.      I have reviewed the past medical history, past surgical history, social history and family history with the patient and they are unchanged from previous note.  ALLERGIES:  has no known allergies.  MEDICATIONS:  Current Outpatient Medications  Medication Sig Dispense Refill   albuterol  (VENTOLIN  HFA) 108 (90 Base) MCG/ACT inhaler Inhale 2 puffs into the lungs every 6 (six) hours as needed for wheezing or shortness of breath. 6.7 g 2   aspirin 81 MG tablet Take 81 mg by mouth daily.     Calcium  Carbonate-Vitamin D  (CALCIUM  600+D PO) Take 1 tablet by mouth daily. Taking calcium  + D3     cholecalciferol (VITAMIN D ) 1000 UNITS tablet Take 5,000 Units by mouth daily.     enzalutamide  (XTANDI ) 40 MG tablet TAKE 4 TABLETS (160 MG TOTAL) BY MOUTH DAILY. 120 tablet 3   lidocaine  4 % Place 1 patch onto the skin daily. Remove & Discard patch within 12 hours or as directed by MD. Apply to sternum. 30 patch 0   methocarbamol  (ROBAXIN ) 500 MG tablet Take 1 tablet (500 mg total) by mouth 3 (three) times daily. 90 tablet 0   Olmesartan -amLODIPine -HCTZ 40-10-25 MG TABS TAKE 1 TABLET BY MOUTH EVERY DAY 90 tablet 1   Omega-3 Fatty Acids (FISH OIL PO) Take 1 tablet  by mouth daily.     oxyCODONE  (OXY IR/ROXICODONE ) 5 MG immediate release tablet Take 1 tablet (5 mg total) by mouth every 6 (six) hours as needed for moderate pain (pain score 4-6) or severe pain (pain score 7-10). 28 tablet 0    pioglitazone -metformin  (ACTOPLUS MET ) 15-850 MG tablet TAKE 1 TABLET BY MOUTH TWICE A DAY 180 tablet 1   rosuvastatin  (CRESTOR ) 10 MG tablet TAKE 1 TABLET BY MOUTH EVERY DAY 90 tablet 1   senna-docusate (SENOKOT-S) 8.6-50 MG tablet Take 2 tablets by mouth at bedtime. For constipation 60 tablet 0   spironolactone  (ALDACTONE ) 25 MG tablet TAKE 1 TABLET (25 MG TOTAL) BY MOUTH DAILY. 90 tablet 1   Tiotropium Bromide -Olodaterol 2.5-2.5 MCG/ACT AERS Inhale 2 puffs into the lungs daily. 4 g 4   Semaglutide ,0.25 or 0.5MG /DOS, (OZEMPIC , 0.25 OR 0.5 MG/DOSE,) 2 MG/3ML SOPN INJECT 0.25 MG WEEKLY FOR 4 WEEKS, THEN INCREASE TO 0.5 MG WEEKLY (Patient not taking: Reported on 01/18/2024) 3 mL 2   No current facility-administered medications for this visit.   Facility-Administered Medications Ordered in Other Visits  Medication Dose Route Frequency Provider Last Rate Last Admin   Leuprolide  Acetate (4 Month) (ELIGARD ) injection 30 mg  30 mg Subcutaneous Once Pennington, Rebekah M, PA-C        VITALS:  Blood pressure (!) 154/79, pulse 65, temperature (!) 97.5 F (36.4 C), temperature source Oral, resp. rate 18, SpO2 100%.  Wt Readings from Last 3 Encounters:  12/26/23 171 lb (77.6 kg)  10/25/23 168 lb 6.4 oz (76.4 kg)  10/04/23 169 lb 12.8 oz (77 kg)    There is no height or weight on file to calculate BMI.  Performance status (ECOG): 1 - Symptomatic but completely ambulatory  PHYSICAL EXAM:   GENERAL:alert, no distress and comfortable SKIN: skin color, texture, turgor are normal, no rashes or significant lesions LYMPH:  no palpable lymphadenopathy in the cervical, axillary or inguinal LUNGS: clear to auscultation and percussion with normal breathing effort HEART: regular rate & rhythm and no murmurs and no lower extremity edema ABDOMEN:abdomen soft, non-tender and normal bowel sounds Musculoskeletal:no cyanosis of digits and no clubbing  NEURO: alert & oriented x 3 with fluent speech  LABORATORY  DATA:  I have reviewed the data as listed   Lab Results  Component Value Date   WBC 5.8 01/18/2024   NEUTROABS 3.4 01/18/2024   HGB 13.3 01/18/2024   HCT 41.2 01/18/2024   MCV 103.3 (H) 01/18/2024   PLT 190 01/18/2024      Chemistry      Component Value Date/Time   NA 139 12/19/2023 1146   NA 144 04/05/2023 1241   NA 140 11/14/2016 1545   K 4.4 12/19/2023 1146   K 3.6 11/14/2016 1545   CL 104 12/19/2023 1146   CO2 21 (L) 12/19/2023 1146   CO2 26 11/14/2016 1545   BUN 26 (H) 12/19/2023 1146   BUN 14 04/05/2023 1241   BUN 12.8 11/14/2016 1545   CREATININE 0.94 12/19/2023 1146   CREATININE 0.72 09/02/2021 1303   CREATININE 0.8 11/14/2016 1545      Component Value Date/Time   CALCIUM  9.5 12/19/2023 1146   CALCIUM  9.4 11/14/2016 1545   ALKPHOS 58 12/19/2023 1146   ALKPHOS 86 11/14/2016 1545   AST 18 12/19/2023 1146   AST 10 (L) 09/02/2021 1303   AST 17 11/14/2016 1545   ALT 5 12/19/2023 1146   ALT 5 09/02/2021 1303   ALT 16 11/14/2016 1545  BILITOT 0.3 12/19/2023 1146   BILITOT 0.3 04/05/2023 1241   BILITOT 0.4 09/02/2021 1303   BILITOT 0.30 11/14/2016 1545       RADIOGRAPHIC STUDIES: I have personally reviewed the radiological images as listed and agreed with the findings in the report.   "

## 2024-01-18 NOTE — Progress Notes (Signed)
 Kyle George presents today for injection per the provider's orders.  Eligard /Xgeva  administration without incident; injection site WNL; see MAR for injection details.  Patient tolerated procedure well and without incident.  No questions or complaints noted at this time.   Patient denies any tooth or jaw pain and no recent or future major dental appointments at this time. Patient reports taking Calcium /Vit D supplements as directed. Patient's Calcium  noted to be 9.2 today.   Treatment given today per MD orders. Tolerated infusion without adverse affects. Vital signs stable. No complaints at this time. Discharged from clinic ambulatory in stable condition. Alert and oriented x 3. F/U with [facility] as scheduled.

## 2024-01-18 NOTE — Patient Instructions (Signed)
 [facility] [place] - A DEPT OF [place]  Discharge Instructions: Thank you for choosing [facility] to provide your oncology and hematology care.  If you have a lab appointment with the Cancer Center - please note that after April 8th, 2024, all labs will be drawn in the cancer center.  You do not have to check in or register with the main entrance as you have in the past but will complete your check-in in the cancer center.  Wear comfortable clothing and clothing appropriate for easy access to any Portacath or PICC line.   We strive to give you quality time with your provider. You may need to reschedule your appointment if you arrive late (15 or more minutes).  Arriving late affects you and other patients whose appointments are after yours.  Also, if you miss three or more appointments without notifying the office, you may be dismissed from the clinic at the providers discretion.      For prescription refill requests, have your pharmacy contact our office and allow 72 hours for refills to be completed.    Today you received Eligard  and Xgeva  injections.     BELOW ARE SYMPTOMS THAT SHOULD BE REPORTED IMMEDIATELY: *FEVER GREATER THAN 100.4 F (38 C) OR HIGHER *CHILLS OR SWEATING *NAUSEA AND VOMITING THAT IS NOT CONTROLLED WITH YOUR NAUSEA MEDICATION *UNUSUAL SHORTNESS OF BREATH *UNUSUAL BRUISING OR BLEEDING *URINARY PROBLEMS (pain or burning when urinating, or frequent urination) *BOWEL PROBLEMS (unusual diarrhea, constipation, pain near the anus) TENDERNESS IN MOUTH AND THROAT WITH OR WITHOUT PRESENCE OF ULCERS (sore throat, sores in mouth, or a toothache) UNUSUAL RASH, SWELLING OR PAIN  UNUSUAL VAGINAL DISCHARGE OR ITCHING   Items with * indicate a potential emergency and should be followed up as soon as possible or go to the Emergency Department if any problems should occur.  Please show the CHEMOTHERAPY ALERT CARD or IMMUNOTHERAPY ALERT CARD  at check-in to the Emergency Department and triage nurse.  Should you have questions after your visit or need to cancel or reschedule your appointment, please contact [facility] [place] - A DEPT OF [facility] [phone]  and follow the prompts.  Office hours are 8:00 a.m. to 4:30 p.m. Monday - Friday. Please note that voicemails left after 4:00 p.m. may not be returned until the following business day.  We are closed weekends and major holidays. You have access to a nurse at all times for urgent questions. Please call the main number to the clinic [phone] and follow the prompts.  For any non-urgent questions, you may also contact your provider using MyChart. We now offer e-Visits for anyone 66 and older to request care online for non-urgent symptoms. For details visit [email or web address].   Also download the MyChart app! Go to the app store, search MyChart, open the app, select [place], and log in with your MyChart username and password.

## 2024-01-20 LAB — METHYLMALONIC ACID, SERUM: Methylmalonic Acid, Quantitative: 502 nmol/L — ABNORMAL HIGH (ref 0–378)

## 2024-01-21 ENCOUNTER — Other Ambulatory Visit (HOSPITAL_COMMUNITY): Payer: Self-pay

## 2024-01-21 ENCOUNTER — Other Ambulatory Visit: Payer: Self-pay | Admitting: Physician Assistant

## 2024-01-21 ENCOUNTER — Ambulatory Visit: Payer: Self-pay | Admitting: Physician Assistant

## 2024-01-21 ENCOUNTER — Other Ambulatory Visit: Payer: Self-pay

## 2024-01-21 DIAGNOSIS — D7589 Other specified diseases of blood and blood-forming organs: Secondary | ICD-10-CM

## 2024-01-21 DIAGNOSIS — C7951 Secondary malignant neoplasm of bone: Secondary | ICD-10-CM

## 2024-01-21 DIAGNOSIS — C61 Malignant neoplasm of prostate: Secondary | ICD-10-CM

## 2024-01-21 MED ORDER — ENZALUTAMIDE 40 MG PO TABS
ORAL_TABLET | ORAL | 3 refills | Status: AC
  Filled 2024-01-21: qty 120, fill #0
  Filled 2024-01-22 – 2024-01-24 (×2): qty 120, 30d supply, fill #0

## 2024-01-21 MED ORDER — VITAMIN B-12 1000 MCG PO TABS: 1000.0000 ug | ORAL_TABLET | Freq: Every day | ORAL | 3 refills | Status: AC

## 2024-01-21 NOTE — Progress Notes (Signed)
 Patient notified and verbalized understanding.

## 2024-01-21 NOTE — Telephone Encounter (Signed)
 Refill Rx for enzalutamide  (Xtandi ) 160 mg daily, as per review of most recent note by medical oncologist (Dr. Davonna).

## 2024-01-21 NOTE — Progress Notes (Signed)
 NURSES: Please call and inform patient of the following: - Additional labs resulted from 01/18/2024 showed low vitamin B12 levels (vitamin B12 205, elevated MMA 502). - I have sent Rx to pharmacy for patient to start taking vitamin B12 1000 mcg daily. - Will recheck B12 levels at follow-up.  Pleasant CHRISTELLA Barefoot, PA-C 01/21/2024 9:57 AM

## 2024-01-22 ENCOUNTER — Telehealth: Payer: Self-pay | Admitting: Pharmacy Technician

## 2024-01-22 ENCOUNTER — Other Ambulatory Visit (HOSPITAL_COMMUNITY): Payer: Self-pay

## 2024-01-22 NOTE — Telephone Encounter (Signed)
 Oral Oncology Patient Advocate Encounter  The patient has been added to the waitlist for potential grant funding for [employer].   The patient provided consent for the team to submit a grant application on their behalf if funding opportunities become available. They understand that wait times may vary depending on fund availability, and that placement on the waitlist does not guarantee that funding will be awarded.  Kyle George ([name]) [name], CPhT  [facility], [name], [facility] - Oral Chemotherapy Patient Advocate Specialist III Phone: [phone]  Fax: [phone]

## 2024-01-23 ENCOUNTER — Encounter: Payer: Self-pay | Admitting: Oncology

## 2024-01-23 ENCOUNTER — Other Ambulatory Visit: Payer: Self-pay

## 2024-01-23 ENCOUNTER — Other Ambulatory Visit (HOSPITAL_COMMUNITY): Payer: Self-pay

## 2024-01-23 ENCOUNTER — Telehealth: Payer: Self-pay | Admitting: Pharmacy Technician

## 2024-01-23 NOTE — Telephone Encounter (Signed)
 Oral Oncology Patient Advocate Encounter  Dx verification approved.   Kyle George ([name]) [name], CPhT  [facility], [name], [facility] - Oral Chemotherapy Patient Advocate Specialist III Phone: [phone]  Fax: [phone]

## 2024-01-23 NOTE — Telephone Encounter (Signed)
 Oral Oncology Patient Advocate Encounter  Kyle George obtained.  Kyle George ([name]) [name], CPhT  [facility], [name], [facility] - Oral Chemotherapy Patient Advocate Specialist III Phone: [phone]  Fax: [phone]

## 2024-01-23 NOTE — Telephone Encounter (Signed)
 Oral Oncology Patient Advocate Encounter  **Pending dx verification**  Was successful in securing patient a $6,000 grant from [facility] to provide copayment coverage for Xtandi .  This will keep the out of pocket expense at $0.     [employer] ID: [id number]   The billing information is as follows and has been shared with [facility].    RxBin: [id number] PCN: [id number] Member ID: [id number] Group ID: [id number] Dates of Eligibility: 12/16/2023 through 12/14/2024  Fund:  Prostate Cancer - Medicare Access  Addis ([name]) [name], CPhT  [facility], [name], [facility] - Oral Chemotherapy Patient Advocate Specialist III Phone: [phone]  Fax: [phone]

## 2024-01-24 ENCOUNTER — Other Ambulatory Visit: Payer: Self-pay

## 2024-01-28 ENCOUNTER — Other Ambulatory Visit: Payer: Self-pay

## 2024-01-28 ENCOUNTER — Other Ambulatory Visit (HOSPITAL_COMMUNITY): Payer: Self-pay

## 2024-01-28 NOTE — Progress Notes (Signed)
 Clinical Intervention Note  Clinical Intervention Notes: Patient reported starting a Vitamin B12 supplement. No DDIs identified with Xtandi    Clinical Intervention Outcomes: Prevention of an adverse drug event   Advertising Account Planner

## 2024-01-28 NOTE — Progress Notes (Signed)
 Specialty Pharmacy Refill Coordination Note  Spoke with Carlyle Mcelrath Maqueda  Kyle George is a 74 y.o. male contacted today regarding refills of specialty medication(s) Enzalutamide  (XTANDI )  Doses on hand: 7 days  Patient requested: Delivery   Delivery date: 01/31/24   Verified address: [address] [facility] [address] [employer]  Medication will be filled on 01/30/24

## 2024-01-30 ENCOUNTER — Other Ambulatory Visit: Payer: Self-pay

## 2024-01-31 ENCOUNTER — Ambulatory Visit

## 2024-01-31 DIAGNOSIS — I4901 Ventricular fibrillation: Secondary | ICD-10-CM | POA: Diagnosis not present

## 2024-02-01 ENCOUNTER — Ambulatory Visit: Payer: Self-pay | Admitting: Cardiology

## 2024-02-01 LAB — CUP PACEART REMOTE DEVICE CHECK
Battery Remaining Longevity: 148 mo
Battery Voltage: 3.19 V
Brady Statistic AP VP Percent: 6.61 %
Brady Statistic AP VS Percent: 0 %
Brady Statistic AS VP Percent: 90.01 %
Brady Statistic AS VS Percent: 3.38 %
Brady Statistic RA Percent Paced: 7.7 %
Brady Statistic RV Percent Paced: 96.62 %
Date Time Interrogation Session: 20260121204538
Implantable Lead Connection Status: 753985
Implantable Lead Connection Status: 753985
Implantable Lead Implant Date: 20250910
Implantable Lead Implant Date: 20250910
Implantable Lead Location: 753859
Implantable Lead Location: 753860
Implantable Lead Model: 3830
Implantable Lead Model: 5076
Implantable Pulse Generator Implant Date: 20250910
Lead Channel Impedance Value: 304 Ohm
Lead Channel Impedance Value: 342 Ohm
Lead Channel Impedance Value: 380 Ohm
Lead Channel Impedance Value: 456 Ohm
Lead Channel Pacing Threshold Amplitude: 0.625 V
Lead Channel Pacing Threshold Amplitude: 0.625 V
Lead Channel Pacing Threshold Pulse Width: 0.4 ms
Lead Channel Pacing Threshold Pulse Width: 0.4 ms
Lead Channel Sensing Intrinsic Amplitude: 4.375 mV
Lead Channel Sensing Intrinsic Amplitude: 4.375 mV
Lead Channel Sensing Intrinsic Amplitude: 7.375 mV
Lead Channel Sensing Intrinsic Amplitude: 7.375 mV
Lead Channel Setting Pacing Amplitude: 1.5 V
Lead Channel Setting Pacing Amplitude: 2 V
Lead Channel Setting Pacing Pulse Width: 0.4 ms
Lead Channel Setting Sensing Sensitivity: 1.2 mV
Zone Setting Status: 755011

## 2024-02-04 NOTE — Progress Notes (Signed)
 Remote PPM Transmission

## 2024-02-06 ENCOUNTER — Encounter: Payer: Self-pay | Admitting: Family Medicine

## 2024-02-08 ENCOUNTER — Encounter

## 2024-02-15 ENCOUNTER — Inpatient Hospital Stay: Attending: Nurse Practitioner

## 2024-02-15 ENCOUNTER — Inpatient Hospital Stay

## 2024-02-15 VITALS — BP 180/87 | HR 60 | Temp 97.4°F | Resp 18

## 2024-02-15 DIAGNOSIS — C61 Malignant neoplasm of prostate: Secondary | ICD-10-CM

## 2024-02-15 LAB — COMPREHENSIVE METABOLIC PANEL WITH GFR
ALT: 6 U/L (ref 0–44)
AST: 16 U/L (ref 15–41)
Albumin: 4.1 g/dL (ref 3.5–5.0)
Alkaline Phosphatase: 48 U/L (ref 38–126)
Anion gap: 13 (ref 5–15)
BUN: 22 mg/dL (ref 8–23)
CO2: 26 mmol/L (ref 22–32)
Calcium: 8.9 mg/dL (ref 8.9–10.3)
Chloride: 105 mmol/L (ref 98–111)
Creatinine, Ser: 0.92 mg/dL (ref 0.61–1.24)
GFR, Estimated: >60 mL/min
Glucose, Bld: 77 mg/dL (ref 70–99)
Potassium: 3.7 mmol/L (ref 3.5–5.1)
Sodium: 144 mmol/L (ref 135–145)
Total Bilirubin: 0.3 mg/dL (ref 0.0–1.2)
Total Protein: 7.5 g/dL (ref 6.5–8.1)

## 2024-02-15 MED ORDER — DENOSUMAB 120 MG/1.7ML ~~LOC~~ SOLN
120.0000 mg | Freq: Once | SUBCUTANEOUS | Status: AC
  Administered 2024-02-15: 120 mg via SUBCUTANEOUS
  Filled 2024-02-15: qty 1.7

## 2024-02-15 NOTE — Patient Instructions (Signed)
 Denosumab  Injection (Oncology) What is this medication? DENOSUMAB  (den oh SUE mab) prevents weakened bones caused by cancer. It may also be used to treat noncancerous bone tumors that cannot be removed by surgery. It can also be used to treat high calcium  levels in the blood caused by cancer. It works by blocking a protein that causes bones to break down quickly. This slows down the release of calcium  from bones, which lowers calcium  levels in your blood. It also makes your bones stronger and less likely to break (fracture). This medicine may be used for other purposes; ask your health care provider or pharmacist if you have questions. COMMON BRAND NAME(S): XGEVA  What should I tell my care team before I take this medication? They need to know if you have any of these conditions: Dental disease Having surgery or tooth extraction Infection Kidney disease Low levels of calcium  or vitamin D  in the blood Malnutrition On hemodialysis Skin conditions or sensitivity Thyroid  or parathyroid disease An unusual reaction to denosumab , other medications, foods, dyes, or preservatives Pregnant or trying to get pregnant Breast-feeding How should I use this medication? This medication is for injection under the skin. It is given by your care team in a hospital or clinic setting. A special MedGuide will be given to you before each treatment. Be sure to read this information carefully each time. Talk to your care team about the use of this medication in children. While it may be prescribed for children as young as 13 years for selected conditions, precautions do apply. Overdosage: If you think you have taken too much of this medicine contact a poison control center or emergency room at once. NOTE: This medicine is only for you. Do not share this medicine with others. What if I miss a dose? Keep appointments for follow-up doses. It is important not to miss your dose. Call your care team if you are unable to  keep an appointment. What may interact with this medication? Do not take this medication with any of the following: Other medications containing denosumab  This medication may also interact with the following: Medications that lower your chance of fighting infection Steroid medications, such as prednisone  or cortisone This list may not describe all possible interactions. Give your health care provider a list of all the medicines, herbs, non-prescription drugs, or dietary supplements you use. Also tell them if you smoke, drink alcohol, or use illegal drugs. Some items may interact with your medicine. What should I watch for while using this medication? Your condition will be monitored carefully while you are receiving this medication. You may need blood work while taking this medication. This medication may increase your risk of getting an infection. Call your care team for advice if you get a fever, chills, sore throat, or other symptoms of a cold or flu. Do not treat yourself. Try to avoid being around people who are sick. You should make sure you get enough calcium  and vitamin D  while you are taking this medication, unless your care team tells you not to. Discuss the foods you eat and the vitamins you take with your care team. Some people who take this medication have severe bone, joint, or muscle pain. This medication may also increase your risk for jaw problems or a broken thigh bone. Tell your care team right away if you have severe pain in your jaw, bones, joints, or muscles. Tell your care team if you have any pain that does not go away or that gets worse. Talk  to your care team if you may be pregnant. Serious birth defects can occur if you take this medication during pregnancy and for 5 months after the last dose. You will need a negative pregnancy test before starting this medication. Contraception is recommended while taking this medication and for 5 months after the last dose. Your care team  can help you find the option that works for you. What side effects may I notice from receiving this medication? Side effects that you should report to your care team as soon as possible: Allergic reactions--skin rash, itching, hives, swelling of the face, lips, tongue, or throat Bone, joint, or muscle pain Low calcium  level--muscle pain or cramps, confusion, tingling, or numbness in the hands or feet Osteonecrosis of the jaw--pain, swelling, or redness in the mouth, numbness of the jaw, poor healing after dental work, unusual discharge from the mouth, visible bones in the mouth Side effects that usually do not require medical attention (report to your care team if they continue or are bothersome): Cough Diarrhea Fatigue Headache Nausea This list may not describe all possible side effects. Call your doctor for medical advice about side effects. You may report side effects to FDA at 1-800-FDA-1088. Where should I keep my medication? This medication is given in a hospital or clinic. It will not be stored at home. NOTE: This sheet is a summary. It may not cover all possible information. If you have questions about this medicine, talk to your doctor, pharmacist, or health care provider.  2024 Elsevier/Gold Standard (2021-05-18 00:00:00)

## 2024-02-15 NOTE — Progress Notes (Signed)
 Labs reviewed. Patient tolerated Xgeva  injection with no complaints voiced.  Site clean and dry with no bruising or swelling noted at site.  See MAR for details.  Band aid applied.  Patient stable during and after injection.  Vss with discharge and left in satisfactory condition with no s/s of distress noted. All follow ups as scheduled.   Dnyla Antonetti

## 2024-03-14 ENCOUNTER — Inpatient Hospital Stay: Attending: Nurse Practitioner

## 2024-03-14 ENCOUNTER — Inpatient Hospital Stay

## 2024-04-09 ENCOUNTER — Encounter: Payer: Self-pay | Admitting: Nurse Practitioner

## 2024-04-14 ENCOUNTER — Inpatient Hospital Stay: Attending: Nurse Practitioner | Admitting: Physician Assistant

## 2024-04-14 ENCOUNTER — Inpatient Hospital Stay

## 2024-05-01 ENCOUNTER — Encounter

## 2024-05-09 ENCOUNTER — Encounter

## 2024-05-27 ENCOUNTER — Ambulatory Visit: Payer: Self-pay

## 2024-07-31 ENCOUNTER — Encounter

## 2024-08-08 ENCOUNTER — Encounter

## 2024-09-24 ENCOUNTER — Ambulatory Visit

## 2024-11-07 ENCOUNTER — Encounter
